# Patient Record
Sex: Male | Born: 1970
Health system: Southern US, Community
[De-identification: ages and names within clinical notes are randomized; demographics above are authoritative.]

## PROBLEM LIST (undated history)

## (undated) ENCOUNTER — Ambulatory Visit (HOSPITAL_COMMUNITY): Discharge status: Absent without leave | Payer: Medicaid Other

## (undated) DIAGNOSIS — C210 Malignant neoplasm of anus, unspecified: Secondary | ICD-10-CM

## (undated) DIAGNOSIS — F419 Anxiety disorder, unspecified: Secondary | ICD-10-CM

## (undated) DIAGNOSIS — Z21 Asymptomatic human immunodeficiency virus [HIV] infection status: Secondary | ICD-10-CM

## (undated) DIAGNOSIS — K648 Other hemorrhoids: Secondary | ICD-10-CM

## (undated) DIAGNOSIS — K37 Unspecified appendicitis: Secondary | ICD-10-CM

## (undated) DIAGNOSIS — B20 Human immunodeficiency virus [HIV] disease: Secondary | ICD-10-CM

## (undated) DIAGNOSIS — K649 Unspecified hemorrhoids: Secondary | ICD-10-CM

## (undated) DIAGNOSIS — F329 Major depressive disorder, single episode, unspecified: Secondary | ICD-10-CM

## (undated) DIAGNOSIS — A630 Anogenital (venereal) warts: Secondary | ICD-10-CM

## (undated) DIAGNOSIS — J439 Emphysema, unspecified: Secondary | ICD-10-CM

## (undated) DIAGNOSIS — J449 Chronic obstructive pulmonary disease, unspecified: Secondary | ICD-10-CM

## (undated) DIAGNOSIS — R911 Solitary pulmonary nodule: Secondary | ICD-10-CM

## (undated) DIAGNOSIS — B353 Tinea pedis: Secondary | ICD-10-CM

## (undated) DIAGNOSIS — A530 Latent syphilis, unspecified as early or late: Secondary | ICD-10-CM

## (undated) DIAGNOSIS — G5600 Carpal tunnel syndrome, unspecified upper limb: Secondary | ICD-10-CM

## (undated) DIAGNOSIS — K6282 Dysplasia of anus: Secondary | ICD-10-CM

## (undated) DIAGNOSIS — Z111 Encounter for screening for respiratory tuberculosis: Secondary | ICD-10-CM

## (undated) DIAGNOSIS — F32A Depression, unspecified: Secondary | ICD-10-CM

## (undated) DIAGNOSIS — N471 Phimosis: Secondary | ICD-10-CM

## (undated) DIAGNOSIS — C20 Malignant neoplasm of rectum: Secondary | ICD-10-CM

## (undated) DIAGNOSIS — J189 Pneumonia, unspecified organism: Secondary | ICD-10-CM

## (undated) DIAGNOSIS — I10 Essential (primary) hypertension: Secondary | ICD-10-CM

## (undated) DIAGNOSIS — K08409 Partial loss of teeth, unspecified cause, unspecified class: Secondary | ICD-10-CM

## (undated) DIAGNOSIS — J939 Pneumothorax, unspecified: Secondary | ICD-10-CM

## (undated) HISTORY — DX: Tinea pedis: B35.3

## (undated) HISTORY — DX: Emphysema, unspecified: J43.9

## (undated) HISTORY — DX: Chronic obstructive pulmonary disease, unspecified: J44.9

## (undated) HISTORY — PX: ORCHIECTOMY: SHX2116

## (undated) HISTORY — DX: Encounter for screening for respiratory tuberculosis: Z11.1

## (undated) HISTORY — DX: Anxiety disorder, unspecified: F41.9

## (undated) HISTORY — PX: CONDYLOMA EXCISION/FULGURATION: SHX1389

## (undated) HISTORY — DX: Other hemorrhoids: K64.8

## (undated) HISTORY — DX: Anogenital (venereal) warts: A63.0

## (undated) HISTORY — DX: Pneumonia, unspecified organism: J18.9

## (undated) HISTORY — DX: Unspecified appendicitis: K37

## (undated) HISTORY — PX: OTHER SURGICAL HISTORY: SHX169

## (undated) HISTORY — DX: Dysplasia of anus: K62.82

---
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
## 2001-07-06 ENCOUNTER — Emergency Department (HOSPITAL_COMMUNITY): Admission: EM | Admit: 2001-07-06 | Discharge: 2001-07-06 | Payer: Self-pay | Admitting: Emergency Medicine

## 2004-02-18 ENCOUNTER — Inpatient Hospital Stay (HOSPITAL_COMMUNITY): Admission: EM | Admit: 2004-02-18 | Discharge: 2004-02-19 | Payer: Self-pay | Admitting: Emergency Medicine

## 2004-02-18 ENCOUNTER — Ambulatory Visit: Payer: Self-pay | Admitting: Infectious Diseases

## 2004-03-05 ENCOUNTER — Emergency Department (HOSPITAL_COMMUNITY): Admission: EM | Admit: 2004-03-05 | Discharge: 2004-03-05 | Payer: Self-pay | Admitting: Family Medicine

## 2004-04-24 ENCOUNTER — Emergency Department (HOSPITAL_COMMUNITY): Admission: EM | Admit: 2004-04-24 | Discharge: 2004-04-24 | Payer: Self-pay | Admitting: Emergency Medicine

## 2004-06-18 ENCOUNTER — Ambulatory Visit (HOSPITAL_COMMUNITY): Admission: RE | Admit: 2004-06-18 | Discharge: 2004-06-18 | Payer: Self-pay | Admitting: Internal Medicine

## 2004-06-18 ENCOUNTER — Ambulatory Visit: Payer: Self-pay | Admitting: Internal Medicine

## 2004-09-04 ENCOUNTER — Emergency Department (HOSPITAL_COMMUNITY): Admission: EM | Admit: 2004-09-04 | Discharge: 2004-09-05 | Payer: Self-pay | Admitting: *Deleted

## 2005-04-21 DIAGNOSIS — J189 Pneumonia, unspecified organism: Secondary | ICD-10-CM

## 2005-04-21 HISTORY — DX: Pneumonia, unspecified organism: J18.9

## 2012-04-21 DIAGNOSIS — Z111 Encounter for screening for respiratory tuberculosis: Secondary | ICD-10-CM

## 2012-04-21 HISTORY — DX: Encounter for screening for respiratory tuberculosis: Z11.1

## 2012-06-28 LAB — CBC AND DIFFERENTIAL
HCT: 44 % (ref 41–53)
Platelets: 163 10*3/uL (ref 150–399)

## 2013-04-09 ENCOUNTER — Emergency Department (HOSPITAL_COMMUNITY)
Admission: EM | Admit: 2013-04-09 | Discharge: 2013-04-09 | Disposition: A | Discharge status: Home / Self Care | Payer: Self-pay | Attending: Emergency Medicine | Admitting: Emergency Medicine

## 2013-04-09 ENCOUNTER — Encounter (HOSPITAL_COMMUNITY): Payer: Self-pay | Admitting: Emergency Medicine

## 2013-04-09 DIAGNOSIS — Z79899 Other long term (current) drug therapy: Secondary | ICD-10-CM | POA: Insufficient documentation

## 2013-04-09 DIAGNOSIS — Z21 Asymptomatic human immunodeficiency virus [HIV] infection status: Secondary | ICD-10-CM | POA: Insufficient documentation

## 2013-04-09 DIAGNOSIS — R51 Headache: Secondary | ICD-10-CM | POA: Insufficient documentation

## 2013-04-09 DIAGNOSIS — G589 Mononeuropathy, unspecified: Secondary | ICD-10-CM | POA: Insufficient documentation

## 2013-04-09 DIAGNOSIS — G629 Polyneuropathy, unspecified: Secondary | ICD-10-CM

## 2013-04-09 DIAGNOSIS — F17200 Nicotine dependence, unspecified, uncomplicated: Secondary | ICD-10-CM | POA: Insufficient documentation

## 2013-04-09 HISTORY — DX: Asymptomatic human immunodeficiency virus (hiv) infection status: Z21

## 2013-04-09 HISTORY — DX: Human immunodeficiency virus (HIV) disease: B20

## 2013-04-09 LAB — BASIC METABOLIC PANEL
BUN: 15 mg/dL (ref 6–23)
CO2: 28 mEq/L (ref 19–32)
Calcium: 8.9 mg/dL (ref 8.4–10.5)
Chloride: 104 mEq/L (ref 96–112)
Creatinine, Ser: 1.15 mg/dL (ref 0.50–1.35)
GFR calc Af Amer: 89 mL/min — ABNORMAL LOW (ref >90)
GFR calc non Af Amer: 77 mL/min — ABNORMAL LOW (ref >90)
Glucose, Bld: 96 mg/dL (ref 70–99)
Potassium: 4.5 mEq/L (ref 3.5–5.1)
Sodium: 139 mEq/L (ref 135–145)

## 2013-04-09 MED ORDER — GABAPENTIN 300 MG PO CAPS: 300.0000 mg | ORAL_CAPSULE | Freq: Three times a day (TID) | ORAL | Status: DC

## 2013-04-09 NOTE — ED Notes (Signed)
Pt called for room.  Pt not in waiting room at this time.

## 2013-04-09 NOTE — ED Notes (Signed)
Pt. Stated, Jeremiah [name] had left arm pain and tingling for the last 2 weeks.

## 2013-04-09 NOTE — ED Notes (Signed)
Pt called for room x 2.  Not in waiting room.

## 2013-04-09 NOTE — ED Provider Notes (Signed)
Complains of constant left hand pain  with tingling and tingling in fingers of right hand for the past 2 weeks. Symptoms worse at night. Not improved with anything. No fever no chest pain no shortness of breath and he also reports right-sided temporal headaches for the past several weeks, intermittent, treated with Marlin Canary powder with transient relief on exam alert Glasgow Coma Score 15 HEENT exam no facial asymmetry neck supple no bruit heart regular in rhythm lungs clear auscultation neurologic motor strength 5 over 5 overall bilateral upper extremities with radial pulse 2+, good capillary refill, full range of motion .suspect peripheral neuropathy   Doug Sou, MD 04/09/13 1555

## 2013-04-09 NOTE — ED Provider Notes (Signed)
CSN: [id number]     Arrival date & time 04/09/13  1324 History   First MD Initiated Contact with Patient 04/09/13 1521     Chief Complaint  Patient presents with  . Arm Pain   HPI  Jeremiah [name] is a 42 y/o male who presents with cc of left arm pain and tingling for the last several weeks. He states that his symptoms are predominately at night. He states he has predominately left hand and arm pain at night. He states that after waking up, he uses his hands and his symptoms improved except for residual tingling in both hands but predominately the left hand. He has tingling currently in his right fingertips as well the entirety of his left hand. He has had associated mild right frontal headaches as well sometimes 2-3 times a day. Today he took a goody powder for his headache with relief. He denies any fevers or neck stiffness. He denies any additional symptoms. He has been taking his antivirals as prescribed and states that his viral load has been "undetectable for years".   Past Medical History  Diagnosis Date  . HIV (human immunodeficiency virus infection)    History reviewed. No pertinent past surgical history. No family history on file. History  Substance Use Topics  . Smoking status: Current Some Day Smoker  . Smokeless tobacco: Not on file  . Alcohol Use: Yes    Review of Systems  Constitutional: Negative for fever and chills.  Respiratory: Negative for cough.   Cardiovascular: Negative for chest pain.  Gastrointestinal: Negative for nausea, vomiting and abdominal pain.  Neurological: Positive for numbness and headaches. Negative for weakness.  All other systems reviewed and are negative.    Allergies  Review of patient's allergies indicates no known allergies.  Home Medications   Current Outpatient Rx  Name  Route  Sig  Dispense  Refill  . emtricitabine-tenofovir (TRUVADA) 200-300 MG per tablet   Oral   Take 1 tablet by mouth daily.         Marland Kitchen lopinavir-ritonavir  (KALETRA) 200-50 MG per tablet   Oral   Take 2 tablets by mouth 2 (two) times daily.         Marland Kitchen gabapentin (NEURONTIN) 300 MG capsule   Oral   Take 1 capsule (300 mg total) by mouth 3 (three) times daily.   90 capsule   0    BP 120/88  Pulse 78  Temp(Src) 97.6 F (36.4 C) (Oral)  Resp 17  SpO2 99% Physical Exam  Constitutional: He is oriented to person, place, and time. He appears well-developed and well-nourished. No distress.  HENT:  Head: Normocephalic and atraumatic.  Mouth/Throat: No oropharyngeal exudate.  Eyes: Conjunctivae are normal. Pupils are equal, round, and reactive to light.  Neck: Normal range of motion. Neck supple.  Cardiovascular: Normal rate and normal heart sounds.  Exam reveals no gallop and no friction rub.   No murmur heard. Pulmonary/Chest: Effort normal and breath sounds normal.  Abdominal: Soft. He exhibits no distension. There is no tenderness.  Musculoskeletal: Normal range of motion. He exhibits no edema and no tenderness.  Neurological: He is alert and oriented to person, place, and time. He has normal strength and normal reflexes. No cranial nerve deficit or sensory deficit. Coordination normal. GCS eye subscore is 4. GCS verbal subscore is 5. GCS motor subscore is 6.  Normal strength and sensation in bilateral upper extremities.   Skin: Skin is warm and dry.    ED Course  Procedures (including critical care time) Labs Review Labs Reviewed  BASIC METABOLIC PANEL - Abnormal; Notable for the following:    GFR calc non Af Amer 77 (*)    GFR calc Af Amer 89 (*)    All other components within normal limits   Imaging Review No results found.  EKG Interpretation   None       MDM   Paraesthesias and pain to bilateral hands for several weeks. Left greater than right. Vitals normal. Non focal exam. No neck pain and Spurling negative. Sensation and strength intact to bilateral hands. No s/s of infection. Has had headaches but have been mild  and with non focal neurological exam doubt mass or bleed. Symptoms are not c/w CVA. Likely neuropathy secondary to medications. Will start on low dose neurotontin. The patient has recently moved here and is currently establishing care with a pcp with an intake exam in the next few days. Return precautions given and discussed with the patient who was in agreement with the plan.   1. Neuropathy        Jeremiah Ace, MD 04/10/13 [phone]

## 2013-04-09 NOTE — ED Notes (Signed)
Pt. Stated, I've been getting headaches 2-3 times a day.

## 2013-04-09 NOTE — ED Notes (Signed)
Patient discharged to home with family. NAD.  

## 2013-04-10 NOTE — ED Provider Notes (Signed)
I have personally seen and examined the patient.  I have discussed the plan of care with the resident.  I have reviewed the documentation on PMH/FH/Soc. History.  I have reviewed the documentation of the resident and agree.  Doug Sou, MD 04/10/13 [phone]

## 2013-05-03 ENCOUNTER — Ambulatory Visit (INDEPENDENT_AMBULATORY_CARE_PROVIDER_SITE_OTHER): Payer: Self-pay

## 2013-05-03 DIAGNOSIS — B20 Human immunodeficiency virus [HIV] disease: Secondary | ICD-10-CM

## 2013-05-03 DIAGNOSIS — Z79899 Other long term (current) drug therapy: Secondary | ICD-10-CM

## 2013-05-03 DIAGNOSIS — A630 Anogenital (venereal) warts: Secondary | ICD-10-CM

## 2013-05-03 DIAGNOSIS — Z8619 Personal history of other infectious and parasitic diseases: Secondary | ICD-10-CM

## 2013-05-03 DIAGNOSIS — Z113 Encounter for screening for infections with a predominantly sexual mode of transmission: Secondary | ICD-10-CM

## 2013-05-03 LAB — CBC WITH DIFFERENTIAL/PLATELET
Basophils Absolute: 0 10*3/uL (ref 0.0–0.1)
Basophils Relative: 0 % (ref 0–1)
Eosinophils Absolute: 0.1 10*3/uL (ref 0.0–0.7)
Eosinophils Relative: 2 % (ref 0–5)
HCT: 41.6 % (ref 39.0–52.0)
Hemoglobin: 14.3 g/dL (ref 13.0–17.0)
Lymphocytes Relative: 44 % (ref 12–46)
Lymphs Abs: 2.8 10*3/uL (ref 0.7–4.0)
MCH: 33 pg (ref 26.0–34.0)
MCHC: 34.4 g/dL (ref 30.0–36.0)
MCV: 96.1 fL (ref 78.0–100.0)
Monocytes Absolute: 0.5 10*3/uL (ref 0.1–1.0)
Monocytes Relative: 7 % (ref 3–12)
Neutro Abs: 3 10*3/uL (ref 1.7–7.7)
Neutrophils Relative %: 47 % (ref 43–77)
Platelets: 195 10*3/uL (ref 150–400)
RBC: 4.33 MIL/uL (ref 4.22–5.81)
RDW: 12.9 % (ref 11.5–15.5)
WBC: 6.3 10*3/uL (ref 4.0–10.5)

## 2013-05-03 LAB — COMPLETE METABOLIC PANEL WITH GFR
ALT: 13 U/L (ref 0–53)
AST: 22 U/L (ref 0–37)
Albumin: 4.1 g/dL (ref 3.5–5.2)
Alkaline Phosphatase: 71 U/L (ref 39–117)
BUN: 17 mg/dL (ref 6–23)
CO2: 27 mEq/L (ref 19–32)
Calcium: 9.2 mg/dL (ref 8.4–10.5)
Chloride: 102 mEq/L (ref 96–112)
Creat: 0.89 mg/dL (ref 0.50–1.35)
GFR, Est African American: >89 mL/min
GFR, Est Non African American: >89 mL/min
Glucose, Bld: 79 mg/dL (ref 70–99)
Potassium: 4.7 mEq/L (ref 3.5–5.3)
Sodium: 135 mEq/L (ref 135–145)
Total Bilirubin: 1.1 mg/dL (ref 0.3–1.2)
Total Protein: 7.2 g/dL (ref 6.0–8.3)

## 2013-05-03 LAB — LIPID PANEL
Cholesterol: 183 mg/dL (ref 0–200)
HDL: 55 mg/dL (ref >39)
LDL Cholesterol: 93 mg/dL (ref 0–99)
Total CHOL/HDL Ratio: 3.3 Ratio
Triglycerides: 177 mg/dL — ABNORMAL HIGH (ref <150)
VLDL: 35 mg/dL (ref 0–40)

## 2013-05-04 LAB — HEPATITIS B SURFACE ANTIBODY,QUALITATIVE: Hep B S Ab: POSITIVE — AB

## 2013-05-04 LAB — URINALYSIS
Bilirubin Urine: NEGATIVE
Glucose, UA: NEGATIVE mg/dL
Hgb urine dipstick: NEGATIVE
Ketones, ur: NEGATIVE mg/dL
Leukocytes, UA: NEGATIVE
Nitrite: NEGATIVE
Protein, ur: NEGATIVE mg/dL
Specific Gravity, Urine: 1.014 (ref 1.005–1.030)
Urobilinogen, UA: 0.2 mg/dL (ref 0.0–1.0)
pH: 6 (ref 5.0–8.0)

## 2013-05-04 LAB — HEPATITIS A ANTIBODY, TOTAL: Hep A Total Ab: NONREACTIVE

## 2013-05-04 LAB — T.PALLIDUM AB, TOTAL: T pallidum Antibodies (TP-PA): >8 S/CO — ABNORMAL HIGH (ref <0.90)

## 2013-05-04 LAB — HEPATITIS B CORE ANTIBODY, TOTAL: Hep B Core Total Ab: REACTIVE — AB

## 2013-05-04 LAB — HIV-1 RNA ULTRAQUANT REFLEX TO GENTYP+
HIV 1 RNA Quant: <20 copies/mL (ref <20)
HIV-1 RNA Quant, Log: <1.3 {Log} (ref <1.30)

## 2013-05-04 LAB — HEPATITIS C ANTIBODY: HCV Ab: NEGATIVE

## 2013-05-04 LAB — T-HELPER CELL (CD4) - (RCID CLINIC ONLY)
CD4 % Helper T Cell: 21 % — ABNORMAL LOW (ref 33–55)
CD4 T Cell Abs: 580 /uL (ref 400–2700)

## 2013-05-04 LAB — HEPATITIS B SURFACE ANTIGEN: Hepatitis B Surface Ag: NEGATIVE

## 2013-05-04 LAB — RPR: RPR Ser Ql: REACTIVE — AB

## 2013-05-04 LAB — RPR TITER: RPR Titer: 1:8 {titer}

## 2013-05-05 LAB — TB SKIN TEST
Induration: 0 mm
TB Skin Test: NEGATIVE

## 2013-05-06 ENCOUNTER — Other Ambulatory Visit: Payer: Self-pay | Admitting: Licensed Clinical Social Worker

## 2013-05-06 DIAGNOSIS — Z23 Encounter for immunization: Secondary | ICD-10-CM

## 2013-05-06 DIAGNOSIS — B20 Human immunodeficiency virus [HIV] disease: Secondary | ICD-10-CM

## 2013-05-06 DIAGNOSIS — A630 Anogenital (venereal) warts: Secondary | ICD-10-CM | POA: Insufficient documentation

## 2013-05-06 DIAGNOSIS — Z8619 Personal history of other infectious and parasitic diseases: Secondary | ICD-10-CM | POA: Insufficient documentation

## 2013-05-06 DIAGNOSIS — A539 Syphilis, unspecified: Secondary | ICD-10-CM | POA: Insufficient documentation

## 2013-05-06 DIAGNOSIS — A530 Latent syphilis, unspecified as early or late: Secondary | ICD-10-CM | POA: Insufficient documentation

## 2013-05-06 HISTORY — DX: Personal history of other infectious and parasitic diseases: Z86.19

## 2013-05-06 MED ORDER — EMTRICITABINE-TENOFOVIR DF 200-300 MG PO TABS: 1.0000 | ORAL_TABLET | Freq: Every day | ORAL | Status: DC

## 2013-05-06 MED ORDER — LOPINAVIR-RITONAVIR 200-50 MG PO TABS: 2.0000 | ORAL_TABLET | Freq: Two times a day (BID) | ORAL | Status: DC

## 2013-05-06 NOTE — Progress Notes (Signed)
Patient is here today after recently being released from prison.  He is experiencing depression related to being homeless and living at the shelter. We discussed his feelings and counseling was offered which he refused at this time.  He is having pain from anal warts which have been present for at least two months this episode but history for at least 3-4 years.  According to patient anal warts  are very large and have continued to spread. He was awaiting a  removal  procedure prior to his release but it was never scheduled.  He states he is 100% adherent with current HIV regimen of Kaletra and Truvada.  He has two tattoos which were placed while in prison bilateral arms. Dental caries . Partial medical records received.  Vaccines Updated Patient spoke with Housing Coordinator which is located within our facility.  Myriam Jacobson is his Case Freight forwarder at [employer].   Laverle Patter, RN

## 2013-05-19 ENCOUNTER — Ambulatory Visit (INDEPENDENT_AMBULATORY_CARE_PROVIDER_SITE_OTHER): Payer: Self-pay | Admitting: Internal Medicine

## 2013-05-19 ENCOUNTER — Encounter: Payer: Self-pay | Admitting: Internal Medicine

## 2013-05-19 VITALS — BP 119/81 | HR 69 | Temp 97.9°F | Wt 164.0 lb

## 2013-05-19 DIAGNOSIS — F329 Major depressive disorder, single episode, unspecified: Secondary | ICD-10-CM | POA: Insufficient documentation

## 2013-05-19 DIAGNOSIS — F32A Depression, unspecified: Secondary | ICD-10-CM | POA: Insufficient documentation

## 2013-05-19 DIAGNOSIS — E785 Hyperlipidemia, unspecified: Secondary | ICD-10-CM

## 2013-05-19 DIAGNOSIS — Z9889 Other specified postprocedural states: Secondary | ICD-10-CM

## 2013-05-19 DIAGNOSIS — F3289 Other specified depressive episodes: Secondary | ICD-10-CM

## 2013-05-19 DIAGNOSIS — F17210 Nicotine dependence, cigarettes, uncomplicated: Secondary | ICD-10-CM

## 2013-05-19 DIAGNOSIS — F17200 Nicotine dependence, unspecified, uncomplicated: Secondary | ICD-10-CM

## 2013-05-19 DIAGNOSIS — E781 Pure hyperglyceridemia: Secondary | ICD-10-CM

## 2013-05-19 DIAGNOSIS — K029 Dental caries, unspecified: Secondary | ICD-10-CM

## 2013-05-19 DIAGNOSIS — B20 Human immunodeficiency virus [HIV] disease: Secondary | ICD-10-CM | POA: Insufficient documentation

## 2013-05-19 DIAGNOSIS — Z9079 Acquired absence of other genital organ(s): Secondary | ICD-10-CM | POA: Insufficient documentation

## 2013-05-19 HISTORY — DX: Hyperlipidemia, unspecified: E78.5

## 2013-05-19 MED ORDER — LOPINAVIR-RITONAVIR 200-50 MG PO TABS: 2.0000 | ORAL_TABLET | Freq: Two times a day (BID) | ORAL | Status: DC

## 2013-05-19 MED ORDER — EMTRICITABINE-TENOFOVIR DF 200-300 MG PO TABS: 1.0000 | ORAL_TABLET | Freq: Every day | ORAL | Status: DC

## 2013-05-19 NOTE — Progress Notes (Signed)
Patient ID: Jeremiah [name], male   DOB: 12/12/1970, 43 y.o.   MRN: [id number]          [facility]  Patient Active Problem List   Diagnosis Date Noted  . Human immunodeficiency virus (HIV) disease 05/19/2013    Priority: High  . Dyslipidemia 05/19/2013  . Cigarette smoker 05/19/2013  . Depression 05/19/2013  . Dental caries 05/19/2013  . Hx of unilateral orchiectomy 05/19/2013  . History of syphilis 05/06/2013  . Anal warts 05/06/2013  . History of chlamydia 05/06/2013    Patient's Medications  New Prescriptions   No medications on file  Previous Medications   No medications on file  Modified Medications   Modified Medication Previous Medication   EMTRICITABINE-TENOFOVIR (TRUVADA) 200-300 MG PER TABLET emtricitabine-tenofovir (TRUVADA) 200-300 MG per tablet      Take 1 tablet by mouth daily.    Take 1 tablet by mouth daily.   LOPINAVIR-RITONAVIR (KALETRA) 200-50 MG PER TABLET lopinavir-ritonavir (KALETRA) 200-50 MG per tablet      Take 2 tablets by mouth 2 (two) times daily.    Take 2 tablets by mouth 2 (two) times daily.  Discontinued Medications   GABAPENTIN (NEURONTIN) 300 MG CAPSULE    Take 1 capsule (300 mg total) by mouth 3 (three) times daily.    Subjective: Jeremiah [name] is here for an intake visit. He was diagnosed with HIV infection in 2004 when he was briefly incarcerated. He does not think he ever been tested previously. He believes he was infected through homosexual intercourse. He was initially devastated but soon came to except his infection. He recalls starting on a regimen that he thinks was Truvada and Sustiva initially but recalls having some sort of side effect to Sustiva. He switched to Truvada and Kaletra about 8 years ago and has had no significant problems tolerating it. He will have occasional diarrhea 2-3 times each month. He rarely recalls missing any doses. He recently moved here from [facility]. He is currently  living in [facility] and working with his case manager her to find stable, section 8 housing. None of his family are aware of his infection and he is now practically close to them. He has told a few close friends including one friend here in [place] who is also HIV infected. He is not in her current relationship and has not been sexually active recently. He has been going to [employer] on a daily basis. He recently had some problems with painful tingling in his left hand and took Neurontin for a short period of time. The pain improved but he still has some mild tingling. He does not feel it is bad enough to continue the Neurontin. He does smoke cigarettes and says he would be willing to consider quitting.  Review of Systems: Pertinent items are noted in HPI.  Past Medical History  Diagnosis Date  . HIV (human immunodeficiency virus infection)   . Pneumonia 2007  . History of tuberculin skin testing within last year 2014    History  Substance Use Topics  . Smoking status: Current Every Day Smoker -- 1.00 packs/day for 24 years    Types: Cigarettes  . Smokeless tobacco: Never Used  . Alcohol Use: No    Family History  Problem Relation Age of Onset  . Diabetes Mother   . Hypertension Mother   . Cancer Mother     breast  . Diabetes Father   . Hypertension Father  No Known Allergies  Objective: Temp: 97.9 F (36.6 C) (01/29 1351) Temp src: Oral (01/29 1351) BP: 119/81 mmHg (01/29 1351) Pulse Rate: 69 (01/29 1351)  There is no height on file to calculate BMI.  General: He is in no distress Oral: His teeth are in poor condition with many carious, broken and missing teeth Skin: No rash Lungs: Clear Cor: Regular S1 and S2 with no murmurs Abdomen: Soft and nontender GU: He has only one testicle. He has multiple, large anal warts Joints and extremities: Normal Neuro: Alert and fully oriented with normal speech and conversation Mood and affect: Normal  Lab  Results Lab Results  Component Value Date   WBC 6.3 05/03/2013   HGB 14.3 05/03/2013   HCT 41.6 05/03/2013   MCV 96.1 05/03/2013   PLT 195 05/03/2013    Lab Results  Component Value Date   CREATININE 0.89 05/03/2013   BUN 17 05/03/2013   NA 135 05/03/2013   K 4.7 05/03/2013   CL 102 05/03/2013   CO2 27 05/03/2013    Lab Results  Component Value Date   ALT 13 05/03/2013   AST 22 05/03/2013   ALKPHOS 71 05/03/2013   BILITOT 1.1 05/03/2013    Lab Results  Component Value Date   CHOL 183 05/03/2013   HDL 55 05/03/2013   LDLCALC 93 05/03/2013   TRIG 177* 05/03/2013   CHOLHDL 3.3 05/03/2013    Lab Results HIV 1 RNA Quant (copies/mL)  Date Value  05/03/2013 <20      CD4 T Cell Abs (/uL)  Date Value  05/03/2013 580      Assessment: His HIV infection is under excellent control. He is quite knowledgeable and adherent to his therapy. It sounds like he has never had any problem with resistance so he does have the option of simplification to a once daily regimen. He prefers to continue his current regimen for now but will consider simplification to once daily Stribild.  He has very poor dentition.  He will need surgery for his large anal warts.  He is in the contemplation stage of cigarette cessation.  Plan: 1. Continue Truvada and Kaletra for now but he will consider switching to once daily Stribild 2. Complete [facility] ADAP application process 3. Dental clinic referral 4. Gen. surgery referral 5. Continue working with his case Freight forwarder on stable housing 6. Cigarette cessation and prevention for positive counseling provided 7. Followup in 6 weeks   Michel Bickers, MD [facility] [place] [phone] pager   [phone] cell 05/19/2013, 5:34 PM

## 2013-05-23 ENCOUNTER — Other Ambulatory Visit: Payer: Self-pay | Admitting: Licensed Clinical Social Worker

## 2013-05-23 DIAGNOSIS — B20 Human immunodeficiency virus [HIV] disease: Secondary | ICD-10-CM

## 2013-05-23 MED ORDER — EMTRICITABINE-TENOFOVIR DF 200-300 MG PO TABS: 1.0000 | ORAL_TABLET | Freq: Every day | ORAL | Status: DC

## 2013-05-23 MED ORDER — LOPINAVIR-RITONAVIR 200-50 MG PO TABS: 2.0000 | ORAL_TABLET | Freq: Two times a day (BID) | ORAL | Status: DC

## 2013-06-03 ENCOUNTER — Other Ambulatory Visit: Payer: Self-pay | Admitting: *Deleted

## 2013-06-03 DIAGNOSIS — B20 Human immunodeficiency virus [HIV] disease: Secondary | ICD-10-CM

## 2013-06-03 MED ORDER — LOPINAVIR-RITONAVIR 200-50 MG PO TABS: 2.0000 | ORAL_TABLET | Freq: Two times a day (BID) | ORAL | Status: DC

## 2013-06-03 MED ORDER — EMTRICITABINE-TENOFOVIR DF 200-300 MG PO TABS: 1.0000 | ORAL_TABLET | Freq: Every day | ORAL | Status: DC

## 2013-06-05 ENCOUNTER — Emergency Department (HOSPITAL_COMMUNITY)
Admission: EM | Admit: 2013-06-05 | Discharge: 2013-06-05 | Disposition: A | Discharge status: Home / Self Care | Payer: Self-pay | Attending: Emergency Medicine | Admitting: Emergency Medicine

## 2013-06-05 ENCOUNTER — Encounter (HOSPITAL_COMMUNITY): Payer: Self-pay | Admitting: Emergency Medicine

## 2013-06-05 DIAGNOSIS — F17200 Nicotine dependence, unspecified, uncomplicated: Secondary | ICD-10-CM | POA: Insufficient documentation

## 2013-06-05 DIAGNOSIS — Z79899 Other long term (current) drug therapy: Secondary | ICD-10-CM | POA: Insufficient documentation

## 2013-06-05 DIAGNOSIS — K029 Dental caries, unspecified: Secondary | ICD-10-CM | POA: Insufficient documentation

## 2013-06-05 DIAGNOSIS — Z8701 Personal history of pneumonia (recurrent): Secondary | ICD-10-CM | POA: Insufficient documentation

## 2013-06-05 DIAGNOSIS — Z21 Asymptomatic human immunodeficiency virus [HIV] infection status: Secondary | ICD-10-CM | POA: Insufficient documentation

## 2013-06-05 DIAGNOSIS — K08109 Complete loss of teeth, unspecified cause, unspecified class: Secondary | ICD-10-CM | POA: Insufficient documentation

## 2013-06-05 MED ORDER — PENICILLIN V POTASSIUM 250 MG PO TABS
500.0000 mg | ORAL_TABLET | Freq: Three times a day (TID) | ORAL | Status: AC
Start: 2013-06-05 — End: 2013-06-12

## 2013-06-05 MED ORDER — HYDROCODONE-ACETAMINOPHEN 5-325 MG PO TABS: 1.0000 | ORAL_TABLET | ORAL | Status: DC | PRN

## 2013-06-05 MED ORDER — IBUPROFEN 800 MG PO TABS: 800.0000 mg | ORAL_TABLET | Freq: Three times a day (TID) | ORAL | Status: DC

## 2013-06-05 NOTE — ED Provider Notes (Signed)
CSN: [id number]     Arrival date & time 06/05/13  1356 History  This chart was scribed for Charlann Lange, non-physician practitioner, working with Tanna Furry, MD, by Sydell Axon, ED Scribe. This patient was seen in room TR09C/TR09C and the patient's care was started at 2:41 PM.   Chief Complaint  Patient presents with  . Dental Pain   The history is provided by the patient. No language interpreter was used.   HPI Comments: Jeremiah [name] is a 43 y.o. male who presents to the Emergency Department complaining of sudden dental pain to his two front upper teeth with onset this morning. He characterizes the pain as a constant, non changing aching pain and rates the severity as moderate. Patient reports that he has had dental problems, with several teeth missing in the past and that his front upper teeth are shaky. He denies any facial swelling or fever. Patient confirms he is an every day smoker.  Past Medical History  Diagnosis Date  . HIV (human immunodeficiency virus infection)   . Pneumonia 2007  . History of tuberculin skin testing within last year 2014   Past Surgical History  Procedure Laterality Date  . Hernia repair  1991    Abdominal   . Unilateral orchiectomy     Family History  Problem Relation Age of Onset  . Diabetes Mother   . Hypertension Mother   . Cancer Mother     breast  . Diabetes Father   . Hypertension Father    History  Substance Use Topics  . Smoking status: Current Every Day Smoker -- 1.00 packs/day for 24 years    Types: Cigarettes  . Smokeless tobacco: Never Used  . Alcohol Use: No    Review of Systems  Constitutional: Negative for chills.  HENT: Positive for dental problem. Negative for facial swelling, sinus pressure and sore throat.   Respiratory: Negative for cough and shortness of breath.   Gastrointestinal: Negative for nausea, vomiting and diarrhea.  Neurological: Negative for weakness.  All other systems reviewed and are  negative.   Allergies  Review of patient's allergies indicates no known allergies.  Home Medications   Current Outpatient Rx  Name  Route  Sig  Dispense  Refill  . emtricitabine-tenofovir (TRUVADA) 200-300 MG per tablet   Oral   Take 1 tablet by mouth daily.   30 tablet   6   . lopinavir-ritonavir (KALETRA) 200-50 MG per tablet   Oral   Take 2 tablets by mouth 2 (two) times daily.   30 tablet   6    Triage Vitals: BP 100/70  Pulse 67  Temp(Src) 97 F (36.1 C) (Oral)  Resp 16  SpO2 100%  Physical Exam  Nursing note and vitals reviewed. Constitutional: He is oriented to person, place, and time. He appears well-developed and well-nourished. No distress.  HENT:  Head: Normocephalic and atraumatic.  Widespread and marked decay of upper front teeth and molars. Lower teeth show widespread gingival disease with redness and swelling. No visualized abscess. No facial swelling. No adenopathy.  Eyes: EOM are normal.  Neck: Neck supple.  Pulmonary/Chest: Effort normal. No respiratory distress.  Musculoskeletal: Normal range of motion.  Lymphadenopathy:    He has no cervical adenopathy.  Neurological: He is alert and oriented to person, place, and time.  Skin: Skin is warm and dry.  Psychiatric: He has a normal mood and affect. His behavior is normal.    ED Course  Procedures (including critical care time) DIAGNOSTIC  STUDIES: Oxygen Saturation is 100% on room air, normal by my interpretation.    COORDINATION OF CARE: 2:45 PM-Will prescribe antibiotic and pain medication, & recommended patient to see a dentist. Advised patient to use dental wax prior to smoking.Treatment plan discussed with patient and patient agrees.  Labs Review Labs Reviewed - No data to display Imaging Review No results found.  EKG Interpretation   None       MDM   Final diagnoses:  None    1. Dental decay  Marked dental decay without visualized drainable abscess. Abx started, pain  medication. Encouraged dental follow up. No fever or facial swelling to suggest systemic infection.  I personally performed the services described in this documentation, which was scribed in my presence. The recorded information has been reviewed and is accurate.    Dewaine Oats, PA-C 06/05/13 1631

## 2013-06-05 NOTE — Discharge Instructions (Signed)
Dental Caries Dental caries is tooth decay. This decay can cause a hole in teeth (cavity) that can get bigger and deeper over time. HOME CARE  Brush and floss your teeth. Do this at least two times a day.  Use a fluoride toothpaste.  Use a mouth rinse if told by your dentist or doctor.  Eat less sugary and starchy foods. Drink less sugary drinks.  Avoid snacking often on sugary and starchy foods. Avoid sipping often on sugary drinks.  Keep regular checkups and cleanings with your dentist.  Use fluoride supplements if told by your dentist or doctor.  Allow fluoride to be applied to teeth if told by your dentist or doctor. MAKE SURE YOU:  Understand these instructions.  Will watch your condition.  Will get help right away if you are not doing well or get worse. Document Released: 01/15/2008 Document Revised: 12/08/2012 Document Reviewed: 04/09/2012 [facility] Patient Information 2014 [place], [place].  Dental Extraction A dental extraction procedure refers to a routine tooth extraction performed by your dentist. The procedure depends on where and how the tooth is positioned. The procedure can be very quick, sometimes lasting only seconds. Reasons for dental extraction include:  Tooth decay.  Infections (abcesses).  The need to make room for other teeth.  Gum disease s where the supporting bone has been destroyed.  Fractures of the tooth leaving it unrestorable.  Extra teeth (supernumerary) or grossly malformed teeth.  Baby teeththat have not fallen out in time and have not permitted the the permanent teeth to erupt properly.  In preparation for braces where there is not enough room to align the teeth properly.  Not enough room for wisdom teeth (particularly those that are impacted).  Prior to receiving radiation to the head and neck,teeth in the field of radiation may need to be extracted. LET YOUR CAREGIVER KNOW ABOUT:  Any allergies.  All medicines you are  taking:  Including herbs, eye drops, over-the-counter medications, and creams.  Blood thinners (anticoagulants), aspirin or other drugs that may affect blood clotting.  Use of steroids (through mouth or as creams).  Previous problems with anesthetics, including local anesthetics.  History of bleeding or blood problems.  Previous surgery.  Possibility of pregnancy if this applies.  Smoking history.  Any health problems. RISKS AND COMPLICATIONS As with any procedure, complications may occur, but they can usually be managed by your caregiver. General surgical complications may include:  Reaction to anesthesia.  Damage to surrounding teeth, nerves, tissues, or structures.  Infection.  Bleeding. With appropriate treatment and care after surgery, the following complications are very uncommon:  Dry socket (blood clot does not form or stay in place over empty socket). This can delay healing.  Incomplete extraction of roots.  Jawbone injury, pain, or weakness. BEFORE THE PROCEDURE  Your dental care provider will:  Take a medical and dental history.  Take an X-ray to evaluate the circumstances and how to best extract the tooth.  Do an oral exam.  Depending on the situation, antibiotics may be given before or after the extraction, or before and after.  Your caregivers may review the procedure, the local anaesthesia and/or sedation being used, and what to expect after the procedure with you.  If needed, your dentist may give you a form of sedation, either by medicine you swallow, gas, or intravenously (IV). This will help to relieve anxiety. Complicated extractions may require the use of general anaesthesia. It is important to follow your caregiver's instructions prior to your procedure  to avoid complications. Steps before your procedure may include:  Alert your caregiver if you feel ill (sore throat, fever, upset stomach, etc.) in the days leading up to your  procedure.  Stop taking certain medications for several days prior to your procedure such as blood thinners.  Take certain medications, such as antibiotics.  Avoid eating and drinking for several hours before the procedure. This will help you to avoid complications from the sedation or anaesthesia.  Sign a patient consent form.  Have a friend or family member drive you to the dentist and drive you home after the procedure.  Wear comfortable, loose clothing. Limit makeup and jewelry.  Quit smoking. If you are a smoker, this will raise the chances of a healing problem after your procedure. If you are thinking about quitting, talk to your surgeon about how long before the operation you should stop smoking. You may also get help from your primary caregiver. PROCEDURE Dental extraction is typically done as an outpatient procedure. IV sedation, local anesthesia, or both may be used. It will keep you comfortable and free of pain during the procedure.  There are 2 types of extractions:  Simple extraction involves a tooth that is visible in the mouth and above the gum line. After local anesthetic is given by injection, and the area is numbed, the dentist will loosen the tooth with a special instrument (elevator). Then another instrument (forceps) will be used to grasp the tooth and remove it from its socket. During the procedure you will feel some pressure, but you should not feel pain. If you do feel pain, tell your dentist. The open socket will be cleaned. Dressings (gauze) will be placed in the socket to reduce bleeding.  Surgical extractions are used if the tooth has not come into the mouth or the tooth is broken off below the gum line. The dentist will make a cut (incision) in the gum and may have to remove some of the bone around the tooth to aid in the removal of the tooth. After removal, stitches (sutures) may be required to close area to help in healing and control bleeding. For some surgical  extractions, you may need a general anesthetic or IV sedation (through the vein). After both types of extractions, you may be given pain medication or other drugs to help healing. Other post operative instructions will be given by your dental caregiver. AFTER THE PROCEDURE  You will have gauze in your mouth where the tooth was removed. Gentle pressure on the gauze for up to 1 hour will help to control bleeding.  A blood clot will begin to form over the open socket. This is normal. Do not touch the area or rinse it.  Your pain will be controlled with medication and self-care.  You will be given detailed instructions for care after surgery. PROGNOSIS While some discomfort is normal after tooth extraction, most patients recover fully in just a few days. SEEK IMMEDIATE DENTAL CARE  You have uncontrolled bleeding, marked swelling, or severe pain.  You develop a fever, difficulty swallowing, or other severe symptoms.  You have questions or concerns. Document Released: 04/07/2005 Document Revised: 06/30/2011 Document Reviewed: 07/12/2010 [facility] Patient Information 2014 [place], [place].

## 2013-06-05 NOTE — ED Notes (Signed)
Multiple dental caries present in mouth. Many teeth missing. Upper front gums and teeth hurting starting this morning. Caries present. No swelling.

## 2013-06-09 NOTE — ED Provider Notes (Signed)
Medical screening examination/treatment/procedure(s) were performed by non-physician practitioner and as supervising physician I was immediately available for consultation/collaboration.  EKG Interpretation   None         Tanna Furry, MD 06/09/13 [phone]

## 2013-07-04 ENCOUNTER — Encounter: Payer: Self-pay | Admitting: Internal Medicine

## 2013-07-04 ENCOUNTER — Ambulatory Visit (INDEPENDENT_AMBULATORY_CARE_PROVIDER_SITE_OTHER): Payer: Self-pay | Admitting: Internal Medicine

## 2013-07-04 VITALS — BP 112/69 | HR 66 | Temp 98.4°F | Ht 71.0 in | Wt 164.5 lb

## 2013-07-04 DIAGNOSIS — Z9889 Other specified postprocedural states: Secondary | ICD-10-CM

## 2013-07-04 DIAGNOSIS — B20 Human immunodeficiency virus [HIV] disease: Secondary | ICD-10-CM

## 2013-07-04 NOTE — Progress Notes (Signed)
Patient ID: Jeremiah [name], male   DOB: October 12, 1970, 43 y.o.   MRN: [id number] @LOGODEPT         Patient Active Problem List   Diagnosis Date Noted  . Human immunodeficiency virus (HIV) disease 05/19/2013    Priority: High  . Dyslipidemia 05/19/2013  . Cigarette smoker 05/19/2013  . Depression 05/19/2013  . Dental caries 05/19/2013  . Hx of unilateral orchiectomy 05/19/2013  . History of syphilis 05/06/2013  . Anal warts 05/06/2013  . History of chlamydia 05/06/2013    Patient's Medications  New Prescriptions   No medications on file  Previous Medications   EMTRICITABINE-TENOFOVIR (TRUVADA) 200-300 MG PER TABLET    Take 1 tablet by mouth daily.   LOPINAVIR-RITONAVIR (KALETRA) 200-50 MG PER TABLET    Take 2 tablets by mouth 2 (two) times daily.  Modified Medications   No medications on file  Discontinued Medications   HYDROCODONE-ACETAMINOPHEN (NORCO/VICODIN) 5-325 MG PER TABLET    Take 1-2 tablets by mouth every 4 (four) hours as needed.   IBUPROFEN (ADVIL,MOTRIN) 800 MG TABLET    Take 1 tablet (800 mg total) by mouth 3 (three) times daily.    Subjective: Jeremiah [name] is in for his routine visit. He is now living in a boarding room and looking for work. He has not been able to see the general surgeon showed about his anal warts. He says that he is currently not thinking about quitting cigarettes but he should. He has not missed any doses of his Truvada or Kaletra. His [place] ADAP has been recertified.  Review of Systems: Pertinent items are noted in HPI.  Past Medical History  Diagnosis Date  . HIV (human immunodeficiency virus infection)   . Pneumonia 2007  . History of tuberculin skin testing within last year 2014    History  Substance Use Topics  . Smoking status: Current Every Day Smoker -- 1.00 packs/day for 24 years    Types: Cigarettes  . Smokeless tobacco: Never Used  . Alcohol Use: No    Family History  Problem Relation Age of Onset  . Diabetes Mother    . Hypertension Mother   . Cancer Mother     breast  . Diabetes Father   . Hypertension Father     No Known Allergies  Objective: Temp: 98.4 F (36.9 C) (03/16 1431) Temp src: Oral (03/16 1431) BP: 112/69 mmHg (03/16 1431) Pulse Rate: 66 (03/16 1431) Body mass index is 22.95 kg/(m^2).  General: He is in good spirits Oral: No oropharyngeal lesions. Some missing teeth Skin:  No rash Lungs: Clear Cor: Regular S1 and S2 with no murmurs  Lab Results Lab Results  Component Value Date   WBC 6.3 05/03/2013   HGB 14.3 05/03/2013   HCT 41.6 05/03/2013   MCV 96.1 05/03/2013   PLT 195 05/03/2013    Lab Results  Component Value Date   CREATININE 0.89 05/03/2013   BUN 17 05/03/2013   NA 135 05/03/2013   K 4.7 05/03/2013   CL 102 05/03/2013   CO2 27 05/03/2013    Lab Results  Component Value Date   ALT 13 05/03/2013   AST 22 05/03/2013   ALKPHOS 71 05/03/2013   BILITOT 1.1 05/03/2013    Lab Results  Component Value Date   CHOL 183 05/03/2013   HDL 55 05/03/2013   LDLCALC 93 05/03/2013   TRIG 177* 05/03/2013   CHOLHDL 3.3 05/03/2013    Lab Results HIV 1 RNA Quant (copies/mL)  Date Value  05/03/2013 <20      CD4 T Cell Abs (/uL)  Date Value  05/03/2013 580      Assessment: His HIV infection is under excellent control. He does not want to change his regimen to a simpler, 1 pill daily regimen.  I did encourage him to consider quitting cigarettes completely.  Plan: 1. Continue Truvada and Kaletra 2. Cigarette cessation counseling provided 3. We will check on his own Orange Card status and try to complete the referral for general surgery evaluation 4. Followup after laboratory 3 months   Michel Bickers, MD [facility] for [place] [phone] pager   [phone] cell 07/04/2013, 2:44 PM

## 2013-07-04 NOTE — Progress Notes (Signed)
Pt given information to apply for "[employer]."  Requested pt bring card to [facility] to make a copy for hhis record.  Will process Surgical referral once that is completed.

## 2013-07-04 NOTE — Addendum Note (Signed)
Addended by: Lorne Skeens D on: 07/04/2013 03:00 PM   Modules accepted: Orders

## 2013-09-14 ENCOUNTER — Emergency Department (HOSPITAL_COMMUNITY): Payer: Self-pay

## 2013-09-14 ENCOUNTER — Encounter (HOSPITAL_COMMUNITY): Payer: Self-pay | Admitting: Emergency Medicine

## 2013-09-14 ENCOUNTER — Emergency Department (HOSPITAL_COMMUNITY)
Admission: EM | Admit: 2013-09-14 | Discharge: 2013-09-14 | Disposition: A | Discharge status: Home / Self Care | Payer: Self-pay | Attending: Emergency Medicine | Admitting: Emergency Medicine

## 2013-09-14 DIAGNOSIS — Z79899 Other long term (current) drug therapy: Secondary | ICD-10-CM | POA: Insufficient documentation

## 2013-09-14 DIAGNOSIS — J189 Pneumonia, unspecified organism: Secondary | ICD-10-CM | POA: Insufficient documentation

## 2013-09-14 DIAGNOSIS — R51 Headache: Secondary | ICD-10-CM | POA: Insufficient documentation

## 2013-09-14 DIAGNOSIS — R519 Headache, unspecified: Secondary | ICD-10-CM

## 2013-09-14 DIAGNOSIS — F17200 Nicotine dependence, unspecified, uncomplicated: Secondary | ICD-10-CM | POA: Insufficient documentation

## 2013-09-14 DIAGNOSIS — Z21 Asymptomatic human immunodeficiency virus [HIV] infection status: Secondary | ICD-10-CM | POA: Insufficient documentation

## 2013-09-14 LAB — COMPREHENSIVE METABOLIC PANEL
ALBUMIN: 3.7 g/dL (ref 3.5–5.2)
ALT: 14 U/L (ref 0–53)
AST: 37 U/L (ref 0–37)
Alkaline Phosphatase: 80 U/L (ref 39–117)
BILIRUBIN TOTAL: 1.5 mg/dL — AB (ref 0.3–1.2)
BUN: 13 mg/dL (ref 6–23)
CALCIUM: 9.1 mg/dL (ref 8.4–10.5)
CHLORIDE: 104 meq/L (ref 96–112)
CO2: 23 mEq/L (ref 19–32)
Creatinine, Ser: 1.08 mg/dL (ref 0.50–1.35)
GFR calc Af Amer: >90 mL/min (ref >90)
GFR calc non Af Amer: 83 mL/min — ABNORMAL LOW (ref >90)
Glucose, Bld: 77 mg/dL (ref 70–99)
Potassium: 4.3 mEq/L (ref 3.7–5.3)
Sodium: 140 mEq/L (ref 137–147)
Total Protein: 7.5 g/dL (ref 6.0–8.3)

## 2013-09-14 LAB — CBC WITH DIFFERENTIAL/PLATELET
Basophils Absolute: 0 10*3/uL (ref 0.0–0.1)
Basophils Relative: 0 % (ref 0–1)
Eosinophils Absolute: 0.2 10*3/uL (ref 0.0–0.7)
Eosinophils Relative: 2 % (ref 0–5)
HEMATOCRIT: 42.5 % (ref 39.0–52.0)
Hemoglobin: 14.9 g/dL (ref 13.0–17.0)
Lymphocytes Relative: 30 % (ref 12–46)
Lymphs Abs: 2.5 10*3/uL (ref 0.7–4.0)
MCH: 34.1 pg — ABNORMAL HIGH (ref 26.0–34.0)
MCHC: 35.1 g/dL (ref 30.0–36.0)
MCV: 97.3 fL (ref 78.0–100.0)
MONO ABS: 0.7 10*3/uL (ref 0.1–1.0)
Monocytes Relative: 9 % (ref 3–12)
NEUTROS ABS: 4.8 10*3/uL (ref 1.7–7.7)
Neutrophils Relative %: 59 % (ref 43–77)
Platelets: 174 10*3/uL (ref 150–400)
RBC: 4.37 MIL/uL (ref 4.22–5.81)
RDW: 12.8 % (ref 11.5–15.5)
WBC: 8.3 10*3/uL (ref 4.0–10.5)

## 2013-09-14 MED ORDER — NAPROXEN 250 MG PO TABS
500.0000 mg | ORAL_TABLET | Freq: Once | ORAL | Status: AC
  Administered 2013-09-14: 500 mg via ORAL
  Filled 2013-09-14: qty 2

## 2013-09-14 MED ORDER — AZITHROMYCIN 250 MG PO TABS: 250.0000 mg | ORAL_TABLET | Freq: Every day | ORAL | Status: DC

## 2013-09-14 MED ORDER — GUAIFENESIN-CODEINE 100-10 MG/5ML PO SOLN: 5.0000 mL | ORAL | Status: DC | PRN

## 2013-09-14 MED ORDER — AZITHROMYCIN 250 MG PO TABS
500.0000 mg | ORAL_TABLET | Freq: Once | ORAL | Status: AC
  Administered 2013-09-14: 500 mg via ORAL
  Filled 2013-09-14: qty 2

## 2013-09-14 MED ORDER — NAPROXEN 500 MG PO TABS: 500.0000 mg | ORAL_TABLET | Freq: Two times a day (BID) | ORAL | Status: DC

## 2013-09-14 NOTE — ED Notes (Addendum)
Pt c/o intermittent HA, cough and weight loss X 3 weeks, pt sts he thinks he has lost about 9lbs. Denies trying to lose weight. sts he has also been feeling very anxious lately. Reports productive cough with yellow sputum. Hx of HIV, goes to Dr. Megan Salon, last seen by him 3 months ago and told everything was good. Nad, skin warm and dry, resp e/u.

## 2013-09-14 NOTE — ED Provider Notes (Signed)
TIME SEEN: 3:16 PM  CHIEF COMPLAINT: Cough, congestion, mild headache  HPI: Patient is a 43 year old male with a history of HIV currently on antiretroviral treatment with a recent CD4 count of 540 and a nondetectable viral load who presents emergency department 2 weeks of productive cough with yellow/green sputum and a mild intermittent headaches. He is also had intermittent right ear pain. No fevers, chills, night sweats, vomiting. He has had some intermittent diarrhea. He has no history of opportunistic infections. No history of tuberculosis. Denies hemoptysis, night sweats but states he has lost 9 pounds in the past 2 weeks. Denies a history of prior tuberculosis. He has had negative tuberculosis skin testing in the past. No chest pain or shortness of breath. No neck pain or neck stiffness. No rash. No tick bites. No recent travel. No sick contacts.  PCP is Dr. Megan Salon  ROS: See HPI Constitutional: no fever  Eyes: no drainage  ENT: no runny nose   Cardiovascular:  no chest pain  Resp: no SOB  GI: no vomiting GU: no dysuria Integumentary: no rash  Allergy: no hives  Musculoskeletal: no leg swelling  Neurological: no slurred speech ROS otherwise negative  PAST MEDICAL HISTORY/PAST SURGICAL HISTORY:  Past Medical History  Diagnosis Date  . HIV (human immunodeficiency virus infection)   . Pneumonia 2007  . History of tuberculin skin testing within last year 2014    MEDICATIONS:  Prior to Admission medications   Medication Sig Start Date End Date Taking? Authorizing Provider  emtricitabine-tenofovir (TRUVADA) 200-300 MG per tablet Take 1 tablet by mouth daily. 06/03/13  Yes Michel Bickers, MD  lopinavir-ritonavir (KALETRA) 200-50 MG per tablet Take 2 tablets by mouth 2 (two) times daily. 06/03/13  Yes Michel Bickers, MD    ALLERGIES:  No Known Allergies  SOCIAL HISTORY:  History  Substance Use Topics  . Smoking status: Current Every Day Smoker -- 1.00 packs/day for 24 years   Types: Cigarettes  . Smokeless tobacco: Never Used  . Alcohol Use: No    FAMILY HISTORY: Family History  Problem Relation Age of Onset  . Diabetes Mother   . Hypertension Mother   . Cancer Mother     breast  . Diabetes Father   . Hypertension Father     EXAM: BP 127/88  Pulse 50  Temp(Src) 98.3 F (36.8 C) (Oral)  Resp 20  Ht 5\' 11"  (1.803 m)  Wt 151 lb 14.4 oz (68.901 kg)  BMI 21.20 kg/m2  SpO2 99% CONSTITUTIONAL: Alert and oriented and responds appropriately to questions. Well-appearing; well-nourished, nontoxic, in no distress HEAD: Normocephalic EYES: Conjunctivae clear, PERRL ENT: normal nose; no rhinorrhea; moist mucous membranes; pharynx without lesions noted NECK: Supple, no meningismus, no LAD  CARD: RRR; S1 and S2 appreciated; no murmurs, no clicks, no rubs, no gallops RESP: Normal chest excursion without splinting or tachypnea; breath sounds clear and equal bilaterally; no wheezing or rhonchi, mild crackles at his right posterior lung base, no respiratory distress or hypoxia ABD/GI: Normal bowel sounds; non-distended; soft, non-tender, no rebound, no guarding BACK:  The back appears normal and is non-tender to palpation, there is no CVA tenderness EXT: Normal ROM in all joints; non-tender to palpation; no edema; normal capillary refill; no cyanosis    SKIN: Normal color for age and race; warm NEURO: Moves all extremities equally PSYCH: The patient's mood and manner are appropriate. Grooming and personal hygiene are appropriate.  MEDICAL DECISION MAKING: Patient here with cough for 2 weeks and intermittent headaches. He  has some crackles at his right lower lung base on exam but no respiratory distress, hypoxia. Patient has a history of HIV but is currently stable with a normal CD4 count and undetectable viral load. He is completely incompetent. He discovered describe some weight loss and states he does live in a boarding house. His chest x-ray however shows no  pneumonia, nothing in his apices. Discussed with patient that he does have some risk factors for tuberculosis but this is less likely given he is in your competent at this time. We'll discharge with prescription for azithromycin for possible community acquired pneumonia/acute bronchitis.  We'll also discharge with naproxen for his intermittent headaches and guaifenesin with codeine for his cough. Have discussed with patient I recommend he followup with his primary care physician. Have discussed strict return precautions. Patient verbalizes understanding and is comfortable with plan.      [place], DO 09/14/13 1658

## 2013-09-14 NOTE — Discharge Instructions (Signed)

## 2013-09-16 NOTE — Discharge Planning (Signed)
[facility] was not able to see patient, [facility] orange card information and resource guide will be sent to the address listed. ° °

## 2013-10-04 ENCOUNTER — Other Ambulatory Visit (INDEPENDENT_AMBULATORY_CARE_PROVIDER_SITE_OTHER): Payer: Self-pay

## 2013-10-04 DIAGNOSIS — Z113 Encounter for screening for infections with a predominantly sexual mode of transmission: Secondary | ICD-10-CM

## 2013-10-04 DIAGNOSIS — B20 Human immunodeficiency virus [HIV] disease: Secondary | ICD-10-CM

## 2013-10-04 NOTE — Addendum Note (Signed)
Addended by: Jarrett Ables D on: 10/04/2013 05:28 PM   Modules accepted: Orders

## 2013-10-04 NOTE — Addendum Note (Signed)
Addended by: Dolan Amen D on: 10/04/2013 04:46 PM   Modules accepted: Orders

## 2013-10-05 LAB — T.PALLIDUM AB, TOTAL: T pallidum Antibodies (TP-PA): >8 S/CO — ABNORMAL HIGH (ref <0.90)

## 2013-10-05 LAB — RPR TITER: RPR Titer: 1:8 {titer}

## 2013-10-05 LAB — RPR: RPR: REACTIVE — AB

## 2013-10-06 LAB — T-HELPER CELL (CD4) - (RCID CLINIC ONLY)
CD4 % Helper T Cell: 21 % — ABNORMAL LOW (ref 33–55)
CD4 T Cell Abs: 590 /uL (ref 400–2700)

## 2013-10-08 LAB — HIV-1 RNA QUANT-NO REFLEX-BLD
HIV 1 RNA Quant: <20 copies/mL (ref <20)
HIV-1 RNA Quant, Log: <1.3 {Log} (ref <1.30)

## 2013-10-18 ENCOUNTER — Ambulatory Visit: Payer: Self-pay | Admitting: Internal Medicine

## 2013-10-20 ENCOUNTER — Ambulatory Visit: Payer: Self-pay | Admitting: Internal Medicine

## 2013-10-25 ENCOUNTER — Ambulatory Visit (INDEPENDENT_AMBULATORY_CARE_PROVIDER_SITE_OTHER): Payer: Self-pay | Admitting: Internal Medicine

## 2013-10-25 ENCOUNTER — Encounter: Payer: Self-pay | Admitting: Internal Medicine

## 2013-10-25 ENCOUNTER — Ambulatory Visit: Payer: Self-pay

## 2013-10-25 VITALS — BP 110/70 | HR 71 | Temp 98.4°F | Ht 71.0 in | Wt 152.5 lb

## 2013-10-25 DIAGNOSIS — A530 Latent syphilis, unspecified as early or late: Secondary | ICD-10-CM

## 2013-10-25 DIAGNOSIS — Z23 Encounter for immunization: Secondary | ICD-10-CM

## 2013-10-25 DIAGNOSIS — B20 Human immunodeficiency virus [HIV] disease: Secondary | ICD-10-CM

## 2013-10-25 MED ORDER — PENICILLIN G BENZATHINE 1200000 UNIT/2ML IM SUSP
1.2000 10*6.[IU] | Freq: Once | INTRAMUSCULAR | Status: AC
  Administered 2013-10-25: 1.2 10*6.[IU] via INTRAMUSCULAR

## 2013-10-25 MED ORDER — PENICILLIN G BENZATHINE 2400000 UNIT/4ML IM SUSP: INTRAMUSCULAR | Status: DC

## 2013-10-25 NOTE — Addendum Note (Signed)
Addended by: Lorne Skeens D on: 10/25/2013 11:05 AM   Modules accepted: Orders

## 2013-10-25 NOTE — Progress Notes (Signed)
Patient ID: Jeremiah [name], male   DOB: [birth year], 43 y.o.   MRN: [id number]          Patient Active Problem List   Diagnosis Date Noted  . Human immunodeficiency virus (HIV) disease 05/19/2013    Priority: High  . Dyslipidemia 05/19/2013  . Cigarette smoker 05/19/2013  . Depression 05/19/2013  . Dental caries 05/19/2013  . Hx of unilateral orchiectomy 05/19/2013  . Latent syphilis 05/06/2013  . Anal warts 05/06/2013  . History of chlamydia 05/06/2013    Patient's Medications  New Prescriptions   PENICILLIN G BENZATHINE 2400000 UNIT/4ML SUSP    2.4 million units IM weekly x3 doses  Previous Medications   AZITHROMYCIN (ZITHROMAX) 250 MG TABLET    Take 1 tablet (250 mg total) by mouth daily. Take daily starting 5/28   EMTRICITABINE-TENOFOVIR (TRUVADA) 200-300 MG PER TABLET    Take 1 tablet by mouth daily.   GUAIFENESIN-CODEINE 100-10 MG/5ML SYRUP    Take 5 mLs by mouth every 4 (four) hours as needed for cough.   LOPINAVIR-RITONAVIR (KALETRA) 200-50 MG PER TABLET    Take 2 tablets by mouth 2 (two) times daily.   NAPROXEN (NAPROSYN) 500 MG TABLET    Take 1 tablet (500 mg total) by mouth 2 (two) times daily.  Modified Medications   No medications on file  Discontinued Medications   No medications on file    Subjective: Jeremiah [name] is in for his routine visit. He continues to reside in the boarding house. He was treated with a Z-Pak and May for possible pneumonia. He is feeling better. He states that he is more interested in trying to quit smoking cigarettes. He denies missing any doses of his Truvada or Kaletra but ran out of Truvada yesterday. He knows that he needs to renew his ADAP certification. He says that he does not think that these completed all the requirements in order to get it arranged card so he has not been able to be referred to general surgery for his anal warts yet. Review of Systems: Pertinent items are noted in HPI.  Past Medical History  Diagnosis Date  . HIV  (human immunodeficiency virus infection)   . Pneumonia 2007  . History of tuberculin skin testing within last year 2014  . Anal warts     History  Substance Use Topics  . Smoking status: Current Every Day Smoker -- 1.00 packs/day for 24 years    Types: Cigarettes  . Smokeless tobacco: Never Used  . Alcohol Use: No    Family History  Problem Relation Age of Onset  . Diabetes Mother   . Hypertension Mother   . Cancer Mother     breast  . Diabetes Father   . Hypertension Father     No Known Allergies  Objective: Temp: 98.4 F (36.9 C) (07/07 1032) Temp src: Oral (07/07 1032) BP: 110/70 mmHg (07/07 1032) Pulse Rate: 71 (07/07 1032) Body mass index is 21.28 kg/(m^2).  General: He is in good spirits Oral: No oropharyngeal lesions Skin: No rash Lungs: Clear Cor: Regular S1 and S2 with no murmurs Mood and affect: Normal  Lab Results Lab Results  Component Value Date   WBC 8.3 09/14/2013   HGB 14.9 09/14/2013   HCT 42.5 09/14/2013   MCV 97.3 09/14/2013   PLT 174 09/14/2013    Lab Results  Component Value Date   CREATININE 1.08 09/14/2013   BUN 13 09/14/2013   NA 140 09/14/2013   K 4.3 09/14/2013  CL 104 09/14/2013   CO2 23 09/14/2013    Lab Results  Component Value Date   ALT 14 09/14/2013   AST 37 09/14/2013   ALKPHOS 80 09/14/2013   BILITOT 1.5* 09/14/2013    Lab Results  Component Value Date   CHOL 183 05/03/2013   HDL 55 05/03/2013   LDLCALC 93 05/03/2013   TRIG 177* 05/03/2013   CHOLHDL 3.3 05/03/2013    Lab Results HIV 1 RNA Quant (copies/mL)  Date Value  10/04/2013 <20   05/03/2013 <20      CD4 T Cell Abs (/uL)  Date Value  10/04/2013 590   05/03/2013 580      Assessment: His HIV infection remains under excellent control. Again, he does not want to simplify to a one pill daily regimen.  He has late latent syphilis and will treat him with 7.4 million units of benzathine penicillin over 3 weeks.  He is now more interested in trying to quit smoking. I  have counseled him on things he can do to come up with a plan to quit. I've also given him written information about the [place] quit line.  Plan: 1. Continue Truvada and Kaletra 2. Benzathine penicillin 2.4 million units IM weekly x3 3. Cigarette cessation counseling provided 4. ADAP recertification 5. Finish requirements for orange card 6. Followup after blood work in 6 months   Michel Bickers, MD [facility] for [place] [phone] pager   [phone] cell 10/25/2013, 10:49 AM

## 2013-11-01 ENCOUNTER — Ambulatory Visit (INDEPENDENT_AMBULATORY_CARE_PROVIDER_SITE_OTHER): Payer: Self-pay | Admitting: *Deleted

## 2013-11-01 DIAGNOSIS — A539 Syphilis, unspecified: Secondary | ICD-10-CM

## 2013-11-01 MED ORDER — PENICILLIN G BENZATHINE 1200000 UNIT/2ML IM SUSP: 1.2000 10*6.[IU] | Freq: Once | INTRAMUSCULAR | Status: DC

## 2013-11-08 ENCOUNTER — Ambulatory Visit (INDEPENDENT_AMBULATORY_CARE_PROVIDER_SITE_OTHER): Payer: Self-pay | Admitting: *Deleted

## 2013-11-08 DIAGNOSIS — A539 Syphilis, unspecified: Secondary | ICD-10-CM

## 2013-11-08 MED ORDER — PENICILLIN G BENZATHINE 1200000 UNIT/2ML IM SUSP
1.2000 10*6.[IU] | Freq: Once | INTRAMUSCULAR | Status: AC
  Administered 2013-11-08: 1.2 10*6.[IU] via INTRAMUSCULAR

## 2013-11-21 ENCOUNTER — Ambulatory Visit: Payer: Self-pay | Admitting: Internal Medicine

## 2014-01-11 ENCOUNTER — Emergency Department (HOSPITAL_COMMUNITY)
Admission: EM | Admit: 2014-01-11 | Discharge: 2014-01-11 | Disposition: A | Discharge status: Home / Self Care | Payer: Self-pay | Attending: Emergency Medicine | Admitting: Emergency Medicine

## 2014-01-11 ENCOUNTER — Encounter (HOSPITAL_COMMUNITY): Payer: Self-pay | Admitting: Emergency Medicine

## 2014-01-11 ENCOUNTER — Emergency Department (HOSPITAL_COMMUNITY): Payer: Self-pay

## 2014-01-11 DIAGNOSIS — Z79899 Other long term (current) drug therapy: Secondary | ICD-10-CM | POA: Insufficient documentation

## 2014-01-11 DIAGNOSIS — W19XXXA Unspecified fall, initial encounter: Secondary | ICD-10-CM

## 2014-01-11 DIAGNOSIS — M79671 Pain in right foot: Secondary | ICD-10-CM

## 2014-01-11 DIAGNOSIS — Y9289 Other specified places as the place of occurrence of the external cause: Secondary | ICD-10-CM | POA: Insufficient documentation

## 2014-01-11 DIAGNOSIS — S99919A Unspecified injury of unspecified ankle, initial encounter: Principal | ICD-10-CM

## 2014-01-11 DIAGNOSIS — W1789XA Other fall from one level to another, initial encounter: Secondary | ICD-10-CM | POA: Insufficient documentation

## 2014-01-11 DIAGNOSIS — Z8701 Personal history of pneumonia (recurrent): Secondary | ICD-10-CM | POA: Insufficient documentation

## 2014-01-11 DIAGNOSIS — M25511 Pain in right shoulder: Secondary | ICD-10-CM

## 2014-01-11 DIAGNOSIS — S8990XA Unspecified injury of unspecified lower leg, initial encounter: Secondary | ICD-10-CM | POA: Insufficient documentation

## 2014-01-11 DIAGNOSIS — Z21 Asymptomatic human immunodeficiency virus [HIV] infection status: Secondary | ICD-10-CM | POA: Insufficient documentation

## 2014-01-11 DIAGNOSIS — F17200 Nicotine dependence, unspecified, uncomplicated: Secondary | ICD-10-CM | POA: Insufficient documentation

## 2014-01-11 DIAGNOSIS — S46909A Unspecified injury of unspecified muscle, fascia and tendon at shoulder and upper arm level, unspecified arm, initial encounter: Secondary | ICD-10-CM | POA: Insufficient documentation

## 2014-01-11 DIAGNOSIS — S060X9A Concussion with loss of consciousness of unspecified duration, initial encounter: Secondary | ICD-10-CM | POA: Insufficient documentation

## 2014-01-11 DIAGNOSIS — Y9389 Activity, other specified: Secondary | ICD-10-CM | POA: Insufficient documentation

## 2014-01-11 DIAGNOSIS — B353 Tinea pedis: Secondary | ICD-10-CM | POA: Insufficient documentation

## 2014-01-11 DIAGNOSIS — M79675 Pain in left toe(s): Secondary | ICD-10-CM

## 2014-01-11 DIAGNOSIS — S99929A Unspecified injury of unspecified foot, initial encounter: Principal | ICD-10-CM

## 2014-01-11 DIAGNOSIS — W11XXXA Fall on and from ladder, initial encounter: Secondary | ICD-10-CM | POA: Insufficient documentation

## 2014-01-11 DIAGNOSIS — S4980XA Other specified injuries of shoulder and upper arm, unspecified arm, initial encounter: Secondary | ICD-10-CM | POA: Insufficient documentation

## 2014-01-11 MED ORDER — CEPHALEXIN 500 MG PO CAPS: 500.0000 mg | ORAL_CAPSULE | Freq: Four times a day (QID) | ORAL | Status: DC

## 2014-01-11 MED ORDER — CLOTRIMAZOLE 1 % EX CREA: 1.0000 "application " | TOPICAL_CREAM | Freq: Two times a day (BID) | CUTANEOUS | Status: DC

## 2014-01-11 MED ORDER — HYDROCODONE-ACETAMINOPHEN 5-325 MG PO TABS: 1.0000 | ORAL_TABLET | ORAL | Status: DC | PRN

## 2014-01-11 MED ORDER — HYDROCODONE-ACETAMINOPHEN 5-325 MG PO TABS
1.0000 | ORAL_TABLET | Freq: Once | ORAL | Status: AC
  Administered 2014-01-11: 1 via ORAL
  Filled 2014-01-11: qty 1

## 2014-01-11 NOTE — ED Provider Notes (Signed)
CSN: [id number]     Arrival date & time 01/11/14  1651 History   First MD Initiated Contact with Patient 01/11/14 2058     Chief Complaint  Patient presents with  . Foot Pain  . Ankle Pain  . 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 States that he fell to the ground and had brief loss of consciousness. States that the people with him stated he was out for only a few seconds. He's landed on his right side. Reports very mild pain in his head but complains mainly of right shoulder and right foot pain. Denies focal neurologic deficits. Had very mild relief with that he powders today. Denies any other bony tenderness and denies any other injury. Denies specifically neck, back, chest, abdominal pain. He has not vomited and has no change in his vision. He is walking well though with a limp due to the pain in his right foot.   Patient also notes that he has some tenderness between his fourth and fifth toes on his left foot that is an ongoing for a few days.  Thinks his 5th toe is swollen.  Patient does have a history of HIV he states is well-controlled with his last CD4 count over 500.  Denies fevers, discharge from the area.  Has tried no home treatments.    Past Medical History  Diagnosis Date  . HIV (human immunodeficiency virus infection)   . Pneumonia 2007  . History of tuberculin skin testing within last year 2014  . Anal warts    Past Surgical History  Procedure Laterality Date  . Hernia repair  1991    Abdominal   . Unilateral orchiectomy     Family History  Problem Relation Age of Onset  . Diabetes Mother   . Hypertension Mother   . Cancer Mother     breast  . Diabetes Father   . Hypertension Father    History  Substance Use Topics  . Smoking status: Current Every Day Smoker -- 1.00  packs/day for 24 years    Types: Cigarettes  . Smokeless tobacco: Never Used  . Alcohol Use: No    Review of Systems  All other systems reviewed and are negative.     Allergies  Review of patient's allergies indicates no known allergies.  Home Medications   Prior to Admission medications   Medication Sig Start Date End Date Taking? Authorizing Provider  emtricitabine-tenofovir (TRUVADA) 200-300 MG per tablet Take 1 tablet by mouth daily. 06/03/13  Yes Michel Bickers, MD  lopinavir-ritonavir (KALETRA) 200-50 MG per tablet Take 2 tablets by mouth 2 (two) times daily. 06/03/13  Yes Michel Bickers, MD   BP 112/79  Pulse 64  Temp(Src) 98.5 F (36.9 C) (Oral)  Resp 18  Ht 5\' 11"  (1.803 m)  Wt 155 lb (70.308 kg)  BMI 21.63 kg/m2  SpO2 100% Physical Exam  Nursing note and vitals reviewed. Constitutional: He appears well-developed and well-nourished. No distress.  HENT:  Head: Normocephalic and atraumatic.  Neck: Neck supple.  Pulmonary/Chest: Effort normal.  Musculoskeletal:       Feet:  Spine nontender, no crepitus, or stepoffs.   Neurological: He is alert.  CN II-XII intact, EOMs intact, no pronator drift, grip strengths equal bilaterally; strength 5/5 in all extremities, sensation intact in all extremities; finger to nose, heel to  shin, rapid alternating movements normal; gait is normal.     Skin: He is not diaphoretic.    ED Course  Procedures (including critical care time) Labs Review Labs Reviewed - No data to display  Imaging Review Dg Shoulder Right  01/11/2014   CLINICAL DATA:  [place] today, posterior right shoulder pain  EXAM: RIGHT SHOULDER - 2+ VIEW  COMPARISON:  None.  FINDINGS: There is no evidence of fracture or dislocation. There is no evidence of arthropathy or other focal bone abnormality. Soft tissues are unremarkable.  IMPRESSION: Negative.   Electronically Signed   By: Skipper Cliche M.D.   On: 01/11/2014 19:49   Dg Foot Complete Right  01/11/2014    CLINICAL DATA:  43 year old male with fall.  Right foot pain.  EXAM: RIGHT FOOT COMPLETE - 3+ VIEW  COMPARISON:  None.  FINDINGS: No acute bony abnormality. No significant soft tissue swelling. No radiopaque foreign body. Unremarkable appearance of the midfoot, with alignment maintained.  IMPRESSION: Negative for acute bony abnormality.  Signed,  Dulcy Fanny. Earleen Newport, DO  [facility]  [facility]   Electronically Signed   By: Corrie Mckusick O.D.   On: 01/11/2014 19:49     EKG Interpretation None      MDM   Final diagnoses:  Fall, initial encounter  Right shoulder pain  Right foot pain  Tinea pedis of left foot  Toe pain, left    Afebrile, nontoxic patient with fall from tree yesterday with LOC.  Neurologically intact. Pt reports only mild head pain.  He c/o right shoulder and right foot pain.  Xrays negative.  No AC joint tenderness.  Pt also with tinea pedis of the left foot with some cracking of the skin and tenderness of the toe.  The toe is no cellulitis and there is no e/o abscess on exam.  His CD4 count is >500.   D/C home with lotrimin, keflex (to prevent wound infection of the foot), norco.  PCP follow up.  Discussed result, findings, treatment, and follow up  with patient.  Pt given return precautions.  Pt verbalizes understanding and agrees with plan.       Last CD4 [address] [place] 01/12/14 [phone]

## 2014-01-11 NOTE — Discharge Instructions (Signed)
Read the information below.  Use the prescribed medication as directed.  Please discuss all new medications with your pharmacist.  Do not take additional tylenol while taking the prescribed pain medication to avoid overdose.  You may return to the Emergency Department at any time for worsening condition or any new symptoms that concern you.   If you develop uncontrolled pain, weakness or numbness of the extremity, severe discoloration of the skin, or you are unable to walk, return to the ER for a recheck.      Athlete's Foot Athlete's foot (tinea pedis) is a fungal infection of the skin on the feet. It often occurs on the skin between the toes or underneath the toes. It can also occur on the soles of the feet. Athlete's foot is more likely to occur in hot, humid weather. Not washing your feet or changing your socks often enough can contribute to athlete's foot. The infection can spread from person to person (contagious). CAUSES Athlete's foot is caused by a fungus. This fungus thrives in warm, moist places. Most people get athlete's foot by sharing shower stalls, towels, and wet floors with an infected person. People with weakened immune systems, including those with diabetes, may be more likely to get athlete's foot. SYMPTOMS   Itchy areas between the toes or on the soles of the feet.  White, flaky, or scaly areas between the toes or on the soles of the feet.  Tiny, intensely itchy blisters between the toes or on the soles of the feet.  Tiny cuts on the skin. These cuts can develop a bacterial infection.  Thick or discolored toenails. DIAGNOSIS  Your caregiver can usually tell what the problem is by doing a physical exam. Your caregiver may also take a skin sample from the rash area. The skin sample may be examined under a microscope, or it may be tested to see if fungus will grow in the sample. A sample may also be taken from your toenail for testing. TREATMENT  Over-the-counter and  prescription medicines can be used to kill the fungus. These medicines are available as powders or creams. Your caregiver can suggest medicines for you. Fungal infections respond slowly to treatment. You may need to continue using your medicine for several weeks. PREVENTION   Do not share towels.  Wear sandals in wet areas, such as shared locker rooms and shared showers.  Keep your feet dry. Wear shoes that allow air to circulate. Wear cotton or wool socks. HOME CARE INSTRUCTIONS   Take medicines as directed by your caregiver. Do not use steroid creams on athlete's foot.  Keep your feet clean and cool. Wash your feet daily and dry them thoroughly, especially between your toes.  Change your socks every day. Wear cotton or wool socks. In hot climates, you may need to change your socks 2 to 3 times per day.  Wear sandals or canvas tennis shoes with good air circulation.  If you have blisters, soak your feet in Burow's solution or Epsom salts for 20 to 30 minutes, 2 times a day to dry out the blisters. Make sure you dry your feet thoroughly afterward. SEEK MEDICAL CARE IF:   You have a fever.  You have swelling, soreness, warmth, or redness in your foot.  You are not getting better after 7 days of treatment.  You are not completely cured after 30 days.  You have any problems caused by your medicines. MAKE SURE YOU:   Understand these instructions.  Will watch your  condition.  Will get help right away if you are not doing well or get worse. Document Released: 04/04/2000 Document Revised: 06/30/2011 Document Reviewed: 01/24/2011 [facility] Patient Information 2015 [place], [place]. This information is not intended to replace advice given to you by your health care provider. Make sure you discuss any questions you have with your health care provider.  Musculoskeletal Pain Musculoskeletal pain is muscle and boney aches and pains. These pains can occur in any part of the body. Your  caregiver may treat you without knowing the cause of the pain. They may treat you if blood or urine tests, X-rays, and other tests were normal.  CAUSES There is often not a definite cause or reason for these pains. These pains may be caused by a type of germ (virus). The discomfort may also come from overuse. Overuse includes working out too hard when your body is not fit. Boney aches also come from weather changes. Bone is sensitive to atmospheric pressure changes. HOME CARE INSTRUCTIONS   Ask when your test results will be ready. Make sure you get your test results.  Only take over-the-counter or prescription medicines for pain, discomfort, or fever as directed by your caregiver. If you were given medications for your condition, do not drive, operate machinery or power tools, or sign legal documents for 24 hours. Do not drink alcohol. Do not take sleeping pills or other medications that may interfere with treatment.  Continue all activities unless the activities cause more pain. When the pain lessens, slowly resume normal activities. Gradually increase the intensity and duration of the activities or exercise.  During periods of severe pain, bed rest may be helpful. Lay or sit in any position that is comfortable.  Putting ice on the injured area.  Put ice in a bag.  Place a towel between your skin and the bag.  Leave the ice on for 15 to 20 minutes, 3 to 4 times a day.  Follow up with your caregiver for continued problems and no reason can be found for the pain. If the pain becomes worse or does not go away, it may be necessary to repeat tests or do additional testing. Your caregiver may need to look further for a possible cause. SEEK IMMEDIATE MEDICAL CARE IF:  You have pain that is getting worse and is not relieved by medications.  You develop chest pain that is associated with shortness or breath, sweating, feeling sick to your stomach (nauseous), or throw up (vomit).  Your pain  becomes localized to the abdomen.  You develop any new symptoms that seem different or that concern you. MAKE SURE YOU:   Understand these instructions.  Will watch your condition.  Will get help right away if you are not doing well or get worse. Document Released: 04/07/2005 Document Revised: 06/30/2011 Document Reviewed: 12/10/2012 [facility] Patient Information 2015 [place], [place]. This information is not intended to replace advice given to you by your health care provider. Make sure you discuss any questions you have with your health care provider.

## 2014-01-11 NOTE — ED Notes (Signed)
Pt. Stated, around 1330 today I started to have itching and pain in my right foot and ankle and goes up my leg.  It hurts and itching.  i had this once before.

## 2014-01-11 NOTE — ED Notes (Addendum)
Pt states that he fell off a ladder approx 12 feet yesterday evening. Pt states "i think i passed out out for a second."Pt states that the pain has gotten worse.  Pt reports rt shoulder pain, rt foot pain. Pt states that he has pain to his rt posterior head no obvious injury noted. A&ox4

## 2014-01-11 NOTE — ED Notes (Signed)
The documentation by Sanda Klein, RN is wrong documentation

## 2014-01-12 NOTE — ED Provider Notes (Signed)
Medical screening examination/treatment/procedure(s) were performed by non-physician practitioner and as supervising physician I was immediately available for consultation/collaboration.   EKG Interpretation None        Wandra Arthurs, MD 01/12/14 1050

## 2014-04-06 ENCOUNTER — Other Ambulatory Visit: Payer: Self-pay | Admitting: Internal Medicine

## 2014-04-19 ENCOUNTER — Other Ambulatory Visit: Payer: Self-pay | Admitting: *Deleted

## 2014-04-19 DIAGNOSIS — B20 Human immunodeficiency virus [HIV] disease: Secondary | ICD-10-CM

## 2014-04-19 MED ORDER — LOPINAVIR-RITONAVIR 200-50 MG PO TABS: 2.0000 | ORAL_TABLET | Freq: Two times a day (BID) | ORAL | Status: DC

## 2014-04-19 MED ORDER — EMTRICITABINE-TENOFOVIR DF 200-300 MG PO TABS: 1.0000 | ORAL_TABLET | Freq: Every day | ORAL | Status: DC

## 2014-04-21 HISTORY — PX: ORCHIECTOMY: SHX2116

## 2014-04-26 ENCOUNTER — Other Ambulatory Visit: Payer: Self-pay

## 2014-04-28 ENCOUNTER — Other Ambulatory Visit: Payer: Self-pay

## 2014-05-01 ENCOUNTER — Other Ambulatory Visit (INDEPENDENT_AMBULATORY_CARE_PROVIDER_SITE_OTHER): Payer: Self-pay

## 2014-05-01 DIAGNOSIS — Z79899 Other long term (current) drug therapy: Secondary | ICD-10-CM

## 2014-05-01 DIAGNOSIS — Z113 Encounter for screening for infections with a predominantly sexual mode of transmission: Secondary | ICD-10-CM

## 2014-05-01 DIAGNOSIS — B20 Human immunodeficiency virus [HIV] disease: Secondary | ICD-10-CM

## 2014-05-01 LAB — COMPREHENSIVE METABOLIC PANEL
ALBUMIN: 4 g/dL (ref 3.5–5.2)
ALK PHOS: 68 U/L (ref 39–117)
ALT: 14 U/L (ref 0–53)
AST: 23 U/L (ref 0–37)
BILIRUBIN TOTAL: 1 mg/dL (ref 0.2–1.2)
BUN: 10 mg/dL (ref 6–23)
CHLORIDE: 107 meq/L (ref 96–112)
CO2: 26 meq/L (ref 19–32)
CREATININE: 1.16 mg/dL (ref 0.50–1.35)
Calcium: 8.8 mg/dL (ref 8.4–10.5)
GLUCOSE: 79 mg/dL (ref 70–99)
POTASSIUM: 4.3 meq/L (ref 3.5–5.3)
Sodium: 140 mEq/L (ref 135–145)
TOTAL PROTEIN: 6.9 g/dL (ref 6.0–8.3)

## 2014-05-01 LAB — CBC
HEMATOCRIT: 43 % (ref 39.0–52.0)
Hemoglobin: 14.6 g/dL (ref 13.0–17.0)
MCH: 33.3 pg (ref 26.0–34.0)
MCHC: 34 g/dL (ref 30.0–36.0)
MCV: 97.9 fL (ref 78.0–100.0)
MPV: 10 fL (ref 8.6–12.4)
Platelets: 177 10*3/uL (ref 150–400)
RBC: 4.39 MIL/uL (ref 4.22–5.81)
RDW: 13.6 % (ref 11.5–15.5)
WBC: 5.6 10*3/uL (ref 4.0–10.5)

## 2014-05-01 LAB — LIPID PANEL
Cholesterol: 142 mg/dL (ref 0–200)
HDL: 55 mg/dL (ref >39)
LDL Cholesterol: 72 mg/dL (ref 0–99)
Total CHOL/HDL Ratio: 2.6 Ratio
Triglycerides: 76 mg/dL (ref <150)
VLDL: 15 mg/dL (ref 0–40)

## 2014-05-01 LAB — RPR: RPR Ser Ql: REACTIVE — AB

## 2014-05-01 LAB — RPR TITER

## 2014-05-02 LAB — T-HELPER CELL (CD4) - (RCID CLINIC ONLY)
CD4 T CELL HELPER: 19 % — AB (ref 33–55)
CD4 T Cell Abs: 500 /uL (ref 400–2700)

## 2014-05-02 LAB — HIV-1 RNA QUANT-NO REFLEX-BLD: HIV 1 RNA Quant: <20 copies/mL (ref <20)

## 2014-05-02 LAB — FLUORESCENT TREPONEMAL AB(FTA)-IGG-BLD: FLUORESCENT TREPONEMAL ABS: REACTIVE — AB

## 2014-05-11 ENCOUNTER — Encounter: Payer: Self-pay | Admitting: Internal Medicine

## 2014-05-11 ENCOUNTER — Ambulatory Visit (INDEPENDENT_AMBULATORY_CARE_PROVIDER_SITE_OTHER): Payer: Self-pay | Admitting: Internal Medicine

## 2014-05-11 VITALS — BP 120/75 | HR 61 | Temp 97.9°F | Wt 158.0 lb

## 2014-05-11 DIAGNOSIS — Z23 Encounter for immunization: Secondary | ICD-10-CM

## 2014-05-11 DIAGNOSIS — K029 Dental caries, unspecified: Secondary | ICD-10-CM

## 2014-05-11 DIAGNOSIS — B20 Human immunodeficiency virus [HIV] disease: Secondary | ICD-10-CM

## 2014-05-11 MED ORDER — ACETAMINOPHEN-CODEINE #3 300-30 MG PO TABS: 1.0000 | ORAL_TABLET | Freq: Four times a day (QID) | ORAL | Status: DC | PRN

## 2014-05-11 NOTE — Progress Notes (Signed)
Patient ID: Jeremiah [name], male   DOB: Jan 18, 1971, 43 y.o.   MRN: [id number]          Patient Active Problem List   Diagnosis Date Noted  . Human immunodeficiency virus (HIV) disease 05/19/2013    Priority: High  . Dyslipidemia 05/19/2013  . Cigarette smoker 05/19/2013  . Depression 05/19/2013  . Dental caries 05/19/2013  . Hx of unilateral orchiectomy 05/19/2013  . Latent syphilis 05/06/2013  . Anal warts 05/06/2013  . History of chlamydia 05/06/2013    Patient's Medications  New Prescriptions   ACETAMINOPHEN-CODEINE (TYLENOL #3) 300-30 MG PER TABLET    Take 1-2 tablets by mouth every 6 (six) hours as needed for moderate pain.  Previous Medications   CLOTRIMAZOLE (LOTRIMIN AF) 1 % CREAM    Apply 1 application topically 2 (two) times daily. To left foot between toes   EMTRICITABINE-TENOFOVIR (TRUVADA) 200-300 MG PER TABLET    Take 1 tablet by mouth daily.   LOPINAVIR-RITONAVIR (KALETRA) 200-50 MG PER TABLET    Take 2 tablets by mouth 2 (two) times daily.  Modified Medications   No medications on file  Discontinued Medications   CEPHALEXIN (KEFLEX) 500 MG CAPSULE    Take 1 capsule (500 mg total) by mouth 4 (four) times daily.   HYDROCODONE-ACETAMINOPHEN (NORCO/VICODIN) 5-325 MG PER TABLET    Take 1 tablet by mouth every 4 (four) hours as needed for moderate pain or severe pain.    Subjective: Jeremiah [name] is in for his routine visit. As usual he never misses a single dose of his Truvada or Kaletra. He is still smoking cigarettes and has no current plan to quit or strong desire to quit. He never completed the paperwork to get his Jeremiah [name] card that would allow him to return to the general surgeon to be seen again for his anal warts hemorrhoids. He has some pain in a crack left upper molar and unfortunately was unable to come to his dental appointment yesterday.  Review of Systems: Pertinent items are noted in HPI.  Past Medical History  Diagnosis Date  . HIV (human  immunodeficiency virus infection)   . Pneumonia 2007  . History of tuberculin skin testing within last year 2014  . Anal warts     History  Substance Use Topics  . Smoking status: Current Every Day Smoker -- 1.00 packs/day for 24 years    Types: Cigarettes  . Smokeless tobacco: Never Used  . Alcohol Use: 0.0 oz/week    0 Not specified per week     Comment: occasional    Family History  Problem Relation Age of Onset  . Diabetes Mother   . Hypertension Mother   . Cancer Mother     breast  . Diabetes Father   . Hypertension Father     No Known Allergies  Objective: Temp: 97.9 F (36.6 C) (01/21 0904) Temp Source: Oral (01/21 0904) BP: 120/75 mmHg (01/21 0904) Pulse Rate: 61 (01/21 0904) Body mass index is 22.05 kg/(m^2).  General: He is a thin male in no distress Oral: Cracked left molar Skin: No rash Lungs: Clear Cor: Regular S1 and S2 with no murmurs   Lab Results Lab Results  Component Value Date   WBC 5.6 05/01/2014   HGB 14.6 05/01/2014   HCT 43.0 05/01/2014   MCV 97.9 05/01/2014   PLT 177 05/01/2014    Lab Results  Component Value Date   CREATININE 1.16 05/01/2014   BUN 10 05/01/2014   NA 140 05/01/2014  K 4.3 05/01/2014   CL 107 05/01/2014   CO2 26 05/01/2014    Lab Results  Component Value Date   ALT 14 05/01/2014   AST 23 05/01/2014   ALKPHOS 68 05/01/2014   BILITOT 1.0 05/01/2014    Lab Results  Component Value Date   CHOL 142 05/01/2014   HDL 55 05/01/2014   LDLCALC 72 05/01/2014   TRIG 76 05/01/2014   CHOLHDL 2.6 05/01/2014    Lab Results HIV 1 RNA QUANT (copies/mL)  Date Value  05/01/2014 <20  10/04/2013 <20  05/03/2013 <20   CD4 T CELL ABS (/uL)  Date Value  05/01/2014 500  10/04/2013 590  05/03/2013 580   RPR 05/01/2014: Down to 4   Assessment: His HIV infection is under excellent control. He does not want to simplify his current regimen.  His syphilis is responding to treatment last spring.  I will give him  some Tylenol 3 for his dental pain and get him back on the list to see the dentist as soon as possible.  He will restart the process to get his Jeremiah [name] card.  I encouraged him to consider cigarette cessation.  Plan: 1. Continue Truvada and Kaletra 2. Tylenol 3 #60 prescribed to use as needed for pain 3. Dental clinic referral again 4. Follow-up after lab work in [place] months   Jeremiah [name], [place] [facility] [place] [phone] pager   [phone] cell 05/11/2014, 9:16 AM

## 2014-05-11 NOTE — Addendum Note (Signed)
Addended by: Myrtis Hopping A on: 05/11/2014 09:34 AM   Modules accepted: Orders

## 2014-09-27 ENCOUNTER — Ambulatory Visit: Payer: Self-pay | Admitting: *Deleted

## 2014-09-27 DIAGNOSIS — F1410 Cocaine abuse, uncomplicated: Secondary | ICD-10-CM

## 2014-09-27 DIAGNOSIS — F1010 Alcohol abuse, uncomplicated: Secondary | ICD-10-CM

## 2014-09-27 NOTE — BH Specialist Note (Signed)
Jeremiah [name] was present today for his scheduled appointment with counselor.  Client communicated that he was referred by Myriam Jacobson from [facility].  Client was oriented time four with good affect and dress.  Client was alert and talkative.  Client has no children and has never been married. Client did not appear to be listening carefully as evidenced by saying yes to questions that should have answered no and vice versa.  Client sometimes responded in a way that indicating he was not listening well.  Client did share that he is presently drinking alcohol daily and smoking crack cocaine 3-4 days a week.  Client stated that he tends to use up all his money or the majority of it on drugs and alcohol.  Client stated that his lights were off right now because he used the money he would have paid his light bill on cocaine. Client communicated that he has been smoking crack since he was 44 years old.  Client said that he knows he has a habit and wants desperately to kick the habit. Client says that having money in his hands is his biggest trigger.  Client shared that he becomes impulsive and makes unwise decisions.  Client stated that he had substance abuse treatment about 15 years ago but it did not help much and was temporary. Client told counselor that he wants to try and get clean by July 10th because he was going to [facility] to visit with his family and wanted his mother to see him clean.  Counselor encouraged client to consider outpatient treatment and made a referral with [facility].  Client stated he would go for an appointment and complete the assessment.  Counselor educated client on early recovery and the signs of addiction.  Client agreed he has a problem but fears he will not be successful.  Counselor explained to client that no effort guarantees failure.  Counselor shared that recovery will be a life long journey and is a day to day process.  Client accepted the referral and made an appointment with [facility] for next Tuesday  the 14 th at [place].  Client also made an additional appointment with counselor to follow up on Thursday the 2 th.  Rolena Infante, LPCA, MA [facility]

## 2014-10-05 ENCOUNTER — Ambulatory Visit: Payer: Self-pay | Admitting: *Deleted

## 2014-11-06 ENCOUNTER — Other Ambulatory Visit (INDEPENDENT_AMBULATORY_CARE_PROVIDER_SITE_OTHER): Payer: Self-pay

## 2014-11-06 DIAGNOSIS — B20 Human immunodeficiency virus [HIV] disease: Secondary | ICD-10-CM

## 2014-11-06 DIAGNOSIS — Z113 Encounter for screening for infections with a predominantly sexual mode of transmission: Secondary | ICD-10-CM

## 2014-11-06 LAB — RPR TITER: RPR Titer: 1:2 {titer}

## 2014-11-06 LAB — RPR: RPR Ser Ql: REACTIVE — AB

## 2014-11-06 NOTE — Addendum Note (Signed)
Addended by: Dolan Amen D on: 11/06/2014 05:28 PM   Modules accepted: Orders

## 2014-11-06 NOTE — Addendum Note (Signed)
Addended by: Reggy Eye on: 11/06/2014 05:07 PM   Modules accepted: Orders

## 2014-11-07 LAB — FLUORESCENT TREPONEMAL AB(FTA)-IGG-BLD: Fluorescent Treponemal ABS: REACTIVE — AB

## 2014-11-07 LAB — HIV-1 RNA QUANT-NO REFLEX-BLD: HIV 1 RNA Quant: <20 copies/mL (ref <20)

## 2014-11-07 LAB — T-HELPER CELL (CD4) - (RCID CLINIC ONLY)
CD4 T CELL ABS: 490 /uL (ref 400–2700)
CD4 T CELL HELPER: 24 % — AB (ref 33–55)

## 2014-11-08 LAB — URINE CYTOLOGY ANCILLARY ONLY
Chlamydia: NEGATIVE
Neisseria Gonorrhea: NEGATIVE

## 2014-11-21 ENCOUNTER — Encounter: Payer: Self-pay | Admitting: Internal Medicine

## 2014-11-21 ENCOUNTER — Ambulatory Visit (INDEPENDENT_AMBULATORY_CARE_PROVIDER_SITE_OTHER): Payer: Self-pay | Admitting: Internal Medicine

## 2014-11-21 ENCOUNTER — Ambulatory Visit: Payer: Self-pay

## 2014-11-21 VITALS — BP 119/79 | HR 60 | Temp 98.4°F | Wt 153.5 lb

## 2014-11-21 DIAGNOSIS — K029 Dental caries, unspecified: Secondary | ICD-10-CM

## 2014-11-21 DIAGNOSIS — B20 Human immunodeficiency virus [HIV] disease: Secondary | ICD-10-CM

## 2014-11-21 MED ORDER — ACETAMINOPHEN-CODEINE #3 300-30 MG PO TABS: 1.0000 | ORAL_TABLET | Freq: Four times a day (QID) | ORAL | Status: DC | PRN

## 2014-11-21 NOTE — Progress Notes (Signed)
Patient ID: Jeremiah [name], male   DOB: 1970-07-10, 44 y.o.   MRN: [id number]          Patient Active Problem List   Diagnosis Date Noted  . Human immunodeficiency virus (HIV) disease 05/19/2013    Priority: High  . Dyslipidemia 05/19/2013  . Cigarette smoker 05/19/2013  . Depression 05/19/2013  . Dental caries 05/19/2013  . Hx of unilateral orchiectomy 05/19/2013  . Latent syphilis 05/06/2013  . Anal warts 05/06/2013  . History of chlamydia 05/06/2013    Patient's Medications  New Prescriptions   No medications on file  Previous Medications   CLOTRIMAZOLE (LOTRIMIN AF) 1 % CREAM    Apply 1 application topically 2 (two) times daily. To left foot between toes   EMTRICITABINE-TENOFOVIR (TRUVADA) 200-300 MG PER TABLET    Take 1 tablet by mouth daily.   LOPINAVIR-RITONAVIR (KALETRA) 200-50 MG PER TABLET    Take 2 tablets by mouth 2 (two) times daily.  Modified Medications   Modified Medication Previous Medication   ACETAMINOPHEN-CODEINE (TYLENOL #3) 300-30 MG PER TABLET acetaminophen-codeine (TYLENOL #3) 300-30 MG per tablet      Take 1-2 tablets by mouth every 6 (six) hours as needed for moderate pain.    Take 1-2 tablets by mouth every 6 (six) hours as needed for moderate pain.  Discontinued Medications   No medications on file    Subjective: Jeremiah [name] is in for his routine HIV follow-up visit. He has not missed any doses of his Truvada or Kaletra since his last visit. He continues to smoke at least a pack of cigarettes daily and has no current plan to quit. He has had oral sex with one partner since his last visit. He is having some recurrent pain in a right maxillary molar. He is still bothered by his anal warts. He has not turned in paperwork for his new orange card yet so he could be seen back by the general surgeons.  Review of Systems: Constitutional: negative for anorexia, fevers and weight loss Eyes: negative Ears, nose, mouth, throat, and face: negative Respiratory:  negative Cardiovascular: negative Gastrointestinal: negative Genitourinary:negative  Past Medical History  Diagnosis Date  . HIV (human immunodeficiency virus infection)   . Pneumonia 2007  . History of tuberculin skin testing within last year 2014  . Anal warts     History  Substance Use Topics  . Smoking status: Current Every Day Smoker -- 1.00 packs/day for 24 years    Types: Cigarettes  . Smokeless tobacco: Never Used  . Alcohol Use: 0.0 oz/week    0 Standard drinks or equivalent per week     Comment: occasional    Family History  Problem Relation Age of Onset  . Diabetes Mother   . Hypertension Mother   . Cancer Mother     breast  . Diabetes Father   . Hypertension Father     No Known Allergies  Objective: Temp: 98.4 F (36.9 C) (08/02 0902) Temp Source: Oral (08/02 0902) BP: 119/79 mmHg (08/02 0902) Pulse Rate: 60 (08/02 0902) Body mass index is 21.42 kg/(m^2).  General: His weight is stable at 153 pounds Oral: No oropharyngeal lesions Skin: No rash Lungs: Clear Cor: Regular S1 and S2 with no murmurs Abdomen: Nontender Mood: Normal. He does not appear anxious or depressed  Lab Results Lab Results  Component Value Date   WBC 5.6 05/01/2014   HGB 14.6 05/01/2014   HCT 43.0 05/01/2014   MCV 97.9 05/01/2014   PLT 177 05/01/2014  Lab Results  Component Value Date   CREATININE 1.16 05/01/2014   BUN 10 05/01/2014   NA 140 05/01/2014   K 4.3 05/01/2014   CL 107 05/01/2014   CO2 26 05/01/2014    Lab Results  Component Value Date   ALT 14 05/01/2014   AST 23 05/01/2014   ALKPHOS 68 05/01/2014   BILITOT 1.0 05/01/2014    Lab Results  Component Value Date   CHOL 142 05/01/2014   HDL 55 05/01/2014   LDLCALC 72 05/01/2014   TRIG 76 05/01/2014   CHOLHDL 2.6 05/01/2014    Lab Results HIV 1 RNA QUANT (copies/mL)  Date Value  11/06/2014 <20  05/01/2014 <20  10/04/2013 <20   CD4 T CELL ABS (/uL)  Date Value  11/06/2014 490    05/01/2014 500  10/04/2013 590     Assessment: His HIV infection remains under excellent control. He does not want to simplify his anti-retroviral regimen.  He is having dental pain. We will get him on the waiting list for the dental clinic. I will give him a supply of Tylenol No. 3.  I talked to him again about the importance of quitting cigarettes.  I will refer him to my partner, Dr. Johnnye Sima, for high resolution anoscopy. Also encouraged him to fill out paperwork for his orange card so we can refer him back to the general surgeons.  Plan: 1. Continue Truvada and Kaletra 2. Tylenol No. 3, #30 to take as needed for pain 3. Cigarette cessation counseling provided 4. Refer for high resolution anoscopy 5. Follow-up here in 6 months after blood work   Michel Bickers, MD [facility] for [place] [phone] pager   [phone] cell 11/21/2014, 9:20 AM

## 2014-12-15 ENCOUNTER — Emergency Department (HOSPITAL_COMMUNITY)
Admission: EM | Admit: 2014-12-15 | Discharge: 2014-12-15 | Disposition: A | Discharge status: Home / Self Care | Payer: No Typology Code available for payment source | Attending: Emergency Medicine | Admitting: Emergency Medicine

## 2014-12-15 ENCOUNTER — Encounter (HOSPITAL_COMMUNITY): Payer: Self-pay | Admitting: Emergency Medicine

## 2014-12-15 DIAGNOSIS — Y92410 Unspecified street and highway as the place of occurrence of the external cause: Secondary | ICD-10-CM | POA: Insufficient documentation

## 2014-12-15 DIAGNOSIS — H6123 Impacted cerumen, bilateral: Secondary | ICD-10-CM | POA: Diagnosis not present

## 2014-12-15 DIAGNOSIS — Y998 Other external cause status: Secondary | ICD-10-CM | POA: Insufficient documentation

## 2014-12-15 DIAGNOSIS — Z79899 Other long term (current) drug therapy: Secondary | ICD-10-CM | POA: Insufficient documentation

## 2014-12-15 DIAGNOSIS — G44209 Tension-type headache, unspecified, not intractable: Secondary | ICD-10-CM | POA: Insufficient documentation

## 2014-12-15 DIAGNOSIS — S46812A Strain of other muscles, fascia and tendons at shoulder and upper arm level, left arm, initial encounter: Secondary | ICD-10-CM | POA: Insufficient documentation

## 2014-12-15 DIAGNOSIS — S0990XA Unspecified injury of head, initial encounter: Secondary | ICD-10-CM | POA: Diagnosis not present

## 2014-12-15 DIAGNOSIS — Z21 Asymptomatic human immunodeficiency virus [HIV] infection status: Secondary | ICD-10-CM | POA: Insufficient documentation

## 2014-12-15 DIAGNOSIS — Z8701 Personal history of pneumonia (recurrent): Secondary | ICD-10-CM | POA: Insufficient documentation

## 2014-12-15 DIAGNOSIS — Y9389 Activity, other specified: Secondary | ICD-10-CM | POA: Insufficient documentation

## 2014-12-15 DIAGNOSIS — S199XXA Unspecified injury of neck, initial encounter: Secondary | ICD-10-CM | POA: Diagnosis not present

## 2014-12-15 DIAGNOSIS — Z720 Tobacco use: Secondary | ICD-10-CM | POA: Insufficient documentation

## 2014-12-15 DIAGNOSIS — Z8619 Personal history of other infectious and parasitic diseases: Secondary | ICD-10-CM | POA: Insufficient documentation

## 2014-12-15 DIAGNOSIS — S4992XA Unspecified injury of left shoulder and upper arm, initial encounter: Secondary | ICD-10-CM | POA: Diagnosis present

## 2014-12-15 MED ORDER — PREDNISONE 20 MG PO TABS
60.0000 mg | ORAL_TABLET | Freq: Once | ORAL | Status: AC
  Administered 2014-12-15: 60 mg via ORAL
  Filled 2014-12-15: qty 3

## 2014-12-15 MED ORDER — PREDNISONE 20 MG PO TABS: 40.0000 mg | ORAL_TABLET | Freq: Every day | ORAL | Status: DC

## 2014-12-15 MED ORDER — METHOCARBAMOL 500 MG PO TABS: 500.0000 mg | ORAL_TABLET | Freq: Two times a day (BID) | ORAL | Status: DC

## 2014-12-15 MED ORDER — METHOCARBAMOL 500 MG PO TABS
500.0000 mg | ORAL_TABLET | Freq: Once | ORAL | Status: AC
  Administered 2014-12-15: 500 mg via ORAL
  Filled 2014-12-15: qty 1
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
## 2014-12-18 ENCOUNTER — Emergency Department (HOSPITAL_COMMUNITY)
Admission: EM | Admit: 2014-12-18 | Discharge: 2014-12-18 | Disposition: A | Discharge status: Home / Self Care | Payer: No Typology Code available for payment source | Attending: Emergency Medicine | Admitting: Emergency Medicine

## 2014-12-18 ENCOUNTER — Encounter (HOSPITAL_COMMUNITY): Payer: Self-pay | Admitting: Vascular Surgery

## 2014-12-18 DIAGNOSIS — S3992XA Unspecified injury of lower back, initial encounter: Secondary | ICD-10-CM | POA: Insufficient documentation

## 2014-12-18 DIAGNOSIS — Z720 Tobacco use: Secondary | ICD-10-CM | POA: Insufficient documentation

## 2014-12-18 DIAGNOSIS — Z79899 Other long term (current) drug therapy: Secondary | ICD-10-CM | POA: Diagnosis not present

## 2014-12-18 DIAGNOSIS — B20 Human immunodeficiency virus [HIV] disease: Secondary | ICD-10-CM | POA: Diagnosis not present

## 2014-12-18 DIAGNOSIS — Z8619 Personal history of other infectious and parasitic diseases: Secondary | ICD-10-CM | POA: Insufficient documentation

## 2014-12-18 DIAGNOSIS — Y998 Other external cause status: Secondary | ICD-10-CM | POA: Diagnosis not present

## 2014-12-18 DIAGNOSIS — Z8701 Personal history of pneumonia (recurrent): Secondary | ICD-10-CM | POA: Diagnosis not present

## 2014-12-18 DIAGNOSIS — S299XXA Unspecified injury of thorax, initial encounter: Secondary | ICD-10-CM | POA: Diagnosis not present

## 2014-12-18 DIAGNOSIS — S199XXA Unspecified injury of neck, initial encounter: Secondary | ICD-10-CM | POA: Insufficient documentation

## 2014-12-18 DIAGNOSIS — M549 Dorsalgia, unspecified: Secondary | ICD-10-CM

## 2014-12-18 DIAGNOSIS — S4992XA Unspecified injury of left shoulder and upper arm, initial encounter: Secondary | ICD-10-CM | POA: Insufficient documentation

## 2014-12-18 DIAGNOSIS — Y92410 Unspecified street and highway as the place of occurrence of the external cause: Secondary | ICD-10-CM | POA: Insufficient documentation

## 2014-12-18 DIAGNOSIS — Y9389 Activity, other specified: Secondary | ICD-10-CM | POA: Diagnosis not present

## 2014-12-18 MED ORDER — ACETAMINOPHEN 500 MG PO TABS: 500.0000 mg | ORAL_TABLET | Freq: Four times a day (QID) | ORAL | Status: DC | PRN

## 2014-12-18 MED ORDER — METHOCARBAMOL 500 MG PO TABS: 500.0000 mg | ORAL_TABLET | Freq: Two times a day (BID) | ORAL | Status: DC
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
## 2014-12-18 NOTE — Discharge Instructions (Signed)
1. Medications: tylenol, robaxin, usual home medications 2. Treatment: rest, drink plenty of fluids, back exercises, ice 3. Follow Up: please followup with your primary doctor this week for discussion of your diagnoses and further evaluation after today's visit; if you do not have a primary care doctor use the resource guide provided to find one; please return to the ER for bowel or bladder incontinence, difficulty walking, fever, chills, new or worsening symptoms   Back Pain, Adult Low back pain is very common. About 1 in 5 people have back pain.The cause of low back pain is rarely dangerous. The pain often gets better over time.About half of people with a sudden onset of back pain feel better in just 2 weeks. About 8 in 10 people feel better by 6 weeks.  CAUSES Some common causes of back pain include:  Strain of the muscles or ligaments supporting the spine.  Wear and tear (degeneration) of the spinal discs.  Arthritis.  Direct injury to the back. DIAGNOSIS Most of the time, the direct cause of low back pain is not known.However, back pain can be treated effectively even when the exact cause of the pain is unknown.Answering your caregiver's questions about your overall health and symptoms is one of the most accurate ways to make sure the cause of your pain is not dangerous. If your caregiver needs more information, he or she may order lab work or imaging tests (X-rays or MRIs).However, even if imaging tests show changes in your back, this usually does not require surgery. HOME CARE INSTRUCTIONS For many people, back pain returns.Since low back pain is rarely dangerous, it is often a condition that people can learn to [facility] their own.   Remain active. It is stressful on the back to sit or stand in one place. Do not sit, drive, or stand in one place for more than 30 minutes at a time. Take short walks on level surfaces as soon as pain allows.Try to increase the length of time you  walk each day.  Do not stay in bed.Resting more than 1 or 2 days can delay your recovery.  Do not avoid exercise or work.Your body is made to move.It is not dangerous to be active, even though your back may hurt.Your back will likely heal faster if you return to being active before your pain is gone.  Pay attention to your body when you bend and lift. Many people have less discomfortwhen lifting if they bend their knees, keep the load close to their bodies,and avoid twisting. Often, the most comfortable positions are those that put less stress on your recovering back.  Find a comfortable position to sleep. Use a firm mattress and lie on your side with your knees slightly bent. If you lie on your back, put a pillow under your knees.  Only take over-the-counter or prescription medicines as directed by your caregiver. Over-the-counter medicines to reduce pain and inflammation are often the most helpful.Your caregiver may prescribe muscle relaxant drugs.These medicines help dull your pain so you can more quickly return to your normal activities and healthy exercise.  Put ice on the injured area.  Put ice in a plastic bag.  Place a towel between your skin and the bag.  Leave the ice on for 15-20 minutes, 03-04 times a day for the first 2 to 3 days. After that, ice and heat may be alternated to reduce pain and spasms.  Ask your caregiver about trying back exercises and gentle massage. This may be of  some benefit.  Avoid feeling anxious or stressed.Stress increases muscle tension and can worsen back pain.It is important to recognize when you are anxious or stressed and learn ways to manage it.Exercise is a great option. SEEK MEDICAL CARE IF:  You have pain that is not relieved with rest or medicine.  You have pain that does not improve in 1 week.  You have new symptoms.  You are generally not feeling well. SEEK IMMEDIATE MEDICAL CARE IF:   You have pain that radiates from your  back into your legs.  You develop new bowel or bladder control problems.  You have unusual weakness or numbness in your arms or legs.  You develop nausea or vomiting.  You develop abdominal pain.  You feel faint. Document Released: 04/07/2005 Document Revised: 10/07/2011 Document Reviewed: 08/09/2013 [facility] Patient Information 2015 [place], [place]. This information is not intended to replace advice given to you by your health care provider. Make sure you discuss any questions you have with your health care provider.  Back Exercises Back exercises help treat and prevent back injuries. The goal is to increase your strength in your belly (abdominal) and back muscles. These exercises can also help with flexibility. Start these exercises when told by your doctor. HOME CARE Back exercises include: Pelvic Tilt.  Lie on your back with your knees bent. Tilt your pelvis until the lower part of your back is against the floor. Hold this position 5 to 10 sec. Repeat this exercise 5 to 10 times. Knee to Chest.  Pull 1 knee up against your chest and hold for 20 to 30 seconds. Repeat this with the other knee. This may be done with the other leg straight or bent, whichever feels better. Then, pull both knees up against your chest. Sit-Ups or Curl-Ups.  Bend your knees 90 degrees. Start with tilting your pelvis, and do a partial, slow sit-up. Only lift your upper half 30 to 45 degrees off the floor. Take at least 2 to 3 seonds for each sit-up. Do not do sit-ups with your knees out straight. If partial sit-ups are difficult, simply do the above but with only tightening your belly (abdominal) muscles and holding it as told. Hip-Lift.  Lie on your back with your knees flexed 90 degrees. Push down with your feet and shoulders as you raise your hips 2 inches off the floor. Hold for 10 seconds, repeat 5 to 10 times. Back Arches.  Lie on your stomach. Prop yourself up on bent elbows. Slowly press on your  hands, causing an arch in your low back. Repeat 3 to 5 times. Shoulder-Lifts.  Lie face down with arms beside your body. Keep hips and belly pressed to floor as you slowly lift your head and shoulders off the floor. Do not overdo your exercises. Be careful in the beginning. Exercises may cause you some mild back discomfort. If the pain lasts for more than 15 minutes, stop the exercises until you see your doctor. Improvement with exercise for back problems is slow.  Document Released: 05/10/2010 Document Revised: 06/30/2011 Document Reviewed: 02/06/2011 [facility] Patient Information 2015 [place], [place]. This information is not intended to replace advice given to you by your health care provider. Make sure you discuss any questions you have with your health care provider.   Emergency Department Resource Guide 1) Find a Doctor and Pay Out of Pocket Although you won't have to find out who is covered by your insurance plan, it is a good idea to ask around and  get recommendations. You will then need to call the office and see if the doctor you have chosen will accept you as a new patient and what types of options they offer for patients who are self-pay. Some doctors offer discounts or will set up payment plans for their patients who do not have insurance, but you will need to ask so you aren't surprised when you get to your appointment.  2) Contact Your Local Health Department Not all health departments have doctors that can see patients for sick visits, but many do, so it is worth a call to see if yours does. If you don't know where your local health department is, you can check in your phone book. The CDC also has a tool to help you locate your state's health department, and many state websites also have listings of all of their local health departments.  3) Find a [facility] If your illness is not likely to be very severe or complicated, you may want to try a walk in clinic. These are popping up  all over the country in pharmacies, drugstores, and shopping centers. They're usually staffed by nurse practitioners or physician assistants that have been trained to treat common illnesses and complaints. They're usually fairly quick and inexpensive. However, if you have serious medical issues or chronic medical problems, these are probably not your best option.  No Primary Care Doctor: - Call Health Connect at  [phone] - they can help you locate a primary care doctor that  accepts your insurance, provides certain services, etc. - Physician Referral Service- [phone]  Chronic Pain Problems: Organization         Address  Phone   Notes  [facility]  [phone] Patients need to be referred by their primary care doctor.   Medication Assistance: Organization         Address  Phone   Notes  [facility] Medication [facility] [place] [place], [place] [address] [phone] --Must be a resident of [facility] -- Must have NO insurance coverage whatsoever (no Medicaid/ Medicare, etc.) -- The pt. MUST have a primary care doctor that directs their care regularly and follows them in the community   MedAssist  [phone]   [employer]  [phone]    Agencies that provide inexpensive medical care: Organization         Address  Phone   Notes  [place]  [phone]   [facility]    [phone]   [facility] [place], [place] [address] [phone]   [place] [address], [place] [phone]   Planned Parenthood    [phone]   [facility]    [phone]   [place] [address] [place], [place] Phone:  [phone], Fax:  [phone] Hours of Operation:  9 am - 6 pm, M-F.  Also accepts Medicaid/Medicare and self-pay.  [facility] for [address], [address], [place] Phone: [phone], Fax: [phone]. Hours of Operation:  8:30 am - 5:30 pm, M-F.  Also accepts Medicaid and self-pay.  [facility] [place] [address], [place] Phone: [phone]   [place], [place], [place] [phone], Ext. [phone] Mondays & Thursdays: 7-9 AM.  First 15 patients are seen on a  first come, first serve basis.    [place] Providers:  Organization         Address  Phone   Notes  [facility] [address], [place] [phone] Also accepts self-pay patients.  [facility] [address], [place]  [phone]   [place], [address], [place] [phone]   [facility] [address], [place] [phone]   Lucianne Lei [address], [place]   [phone] Only accepts [place] Access [place] patients after they have their name applied to their card.   Self-Pay (no insurance) in [facility]:  Organization         Address  Phone   Notes  Sickle Cell Patients, [facility] [place] [phone]   [facility] [place] [phone]   [facility] [place]  [place], [place], [place] [phone]   [facility]/Dr. Osei-Bonsu  [address] [place] or [address], [place] [phone] Phone number for both [place] and [place] locations is the same.  [facility] [address], [place] [phone]   [facility] [address] [place] or [address] [phone]   [facility] [address] [place] [phone], phone; [phone], fax Sees patients 1st and 3rd Saturday of every  month.  Must not qualify for public or private insurance (i.e. Medicaid, Medicare, [place] Health Choice, Veterans' Benefits)  Household income should be no more than 200% of the poverty level The clinic cannot treat you if you are pregnant or think you are pregnant  Sexually transmitted diseases are not treated at the clinic.    Dental Care: Organization         Address  Phone  Notes  [facility] Department of [facility] [place] [phone] Accepts children up to age 42 who are enrolled in [place] or [place]; pregnant women with a Medicaid card; and children who have applied for Medicaid or [place] Health Choice, but were declined, whose parents can pay a reduced fee at time of service.  [facility] Department of [facility]  [address], [place] [phone] Accepts children up to age 56 who are enrolled in [place] or [place]; pregnant women with a Medicaid card; and children who have applied for Medicaid or [place] Health Choice, but were declined, whose parents can pay a reduced fee at time of service.  [facility] Adult Dental Access PROGRAM  [place] [phone] Patients are seen by appointment only. Walk-ins are not accepted. [place] will see patients 60 years of age and older. Monday - Tuesday (8am-5pm) Most Wednesdays (8:30-5pm) $30 per visit, cash only  [facility] Adult Dental Access PROGRAM  [address], [facility] [phone] Patients are seen by appointment only. Walk-ins are not accepted. [place] will see patients 42 years of age and older. One Wednesday Evening (Monthly: Volunteer Based).  $30 per visit, cash only  [place]  [phone] for adults; Children under age 2, call Graduate Pediatric Dentistry at [phone]. Children aged 84-14,  please call [phone] to request a pediatric application.  Dental  services are provided in all areas of dental care including fillings, crowns and bridges, complete and partial dentures, implants, gum treatment, root canals, and extractions. Preventive care is also provided. Treatment is provided to both adults and children. Patients are selected via a lottery and there is often a waiting list.   [facility] [address], [place]  [phone] [email or web address]   [facility] [address] [place], [place] [phone], Ext. [phone] Second and Fourth Thursday of each month, opens at 6:30 AM; Clinic ends at 9 AM.  Patients are seen on a first-come first-served basis, and a limited number are seen during each clinic.   [facility]  [address] Hillard Danker [place], [place] [phone]   Eligibility Requirements You must have lived in [place], [place], or [place] counties for at least the last three months.   You cannot be eligible for state or federal sponsored [employer], including [employer], [place], or [employer].   You generally cannot be eligible for healthcare insurance through your employer.    How to apply: Eligibility screenings are held every Tuesday and Wednesday afternoon from 1:00 pm until 4:00 pm. You do not need an appointment for the interview!  [facility] [address], Olivia Lopez de [place]ierrez, [place]   [place]  [place] Department  [place]  [phone]    Behavioral Health Resources in the Community: Intensive Outpatient Programs Organization         Address  Phone  Notes  Clio Homer. [address] [place], [place] [phone]   [facility] [address], [place], [place]   ADS: Alcohol & Drug Svcs [address], [place], [place]   [place] [address]    [place], [place] or [phone]   Substance Abuse Resources Organization         Address  Phone  Notes  Alcohol and Drug Services  [phone]   [place]  [phone]   [facility] [place]   Chinita Pester  [phone]   Residential & Outpatient Substance Abuse Program  [phone]   Psychological Services Organization         Address  Phone  Notes  [facility] [place]  [place]  [phone]   [place] [address] [place] or [phone]    Mobile Crisis Teams Organization         Address  Phone  Notes  [facility], Mobile Crisis Care Unit  [phone]   [facility]  [address] [place], [place]   [place] [address] [place] [phone]    Self-Help/Support Groups Organization         Address  Phone             Notes  [place]. of [place] - variety of support groups  [place] Call for more information  Narcotics Anonymous (NA), Caring Services [address], [place]  2 meetings at this location   Special educational needs teacher         Address  Phone  Notes  ASAP Residential Treatment [place],    [place]  [phone]  [facility]  [address], [place] [id number], [place], [place]   [place],  [phone] Admissions: 8am-3pm M-F  Incentives Substance [place] [address],    [place], [place] [phone]   [facility] [address] [place], [place], [place]   [facility] [address]  [place], [place]   [facility] Programs - Intensive Outpatient [address] Kristeen Mans [address], [place], [place]   [facility] [place].,  [place], [place]  [phone] or [phone]   [facility] (RTS) [address] [place], [place] Accepts Medicaid  [facility] [place] [address]  [place] [phone] Substance Abuse/Addiction Treatment   [facility] Organization         Address  Phone  Notes  [facility]  [phone]   Domenic Schwab, PhD [address] Arlis Porta [place], [place]   [phone] or [phone]   [place], [place] [phone]   [facility] [address], [place], [place] [phone] Insurance/Medicaid/sponsorship through [facility] and Families [address] [place]                                    [place], [place] [phone] [place] [address], [place] [phone]    Dr. Adele Schilder  [phone]   [facility] Dept. 1) [address] [place] 2) [place] 3)  [place] [address], [place] [phone]  [phone]   [place] [phone] or [phone] (After Hours)

## 2014-12-29 ENCOUNTER — Telehealth: Payer: Self-pay | Admitting: *Deleted

## 2014-12-29 NOTE — Telephone Encounter (Signed)
Patient called and advised he has had a pain in his back for about 2 weeks and has an orange card and would like to see the doctor and see if he needs to be sent to ortho. Gave him an appt to see the doctor 01/02/15.

## 2015-01-02 ENCOUNTER — Telehealth: Payer: Self-pay | Admitting: *Deleted

## 2015-01-02 ENCOUNTER — Ambulatory Visit (INDEPENDENT_AMBULATORY_CARE_PROVIDER_SITE_OTHER): Payer: Self-pay | Admitting: Internal Medicine

## 2015-01-02 ENCOUNTER — Encounter: Payer: Self-pay | Admitting: Internal Medicine

## 2015-01-02 VITALS — BP 111/79 | HR 59 | Temp 98.0°F | Ht 71.0 in | Wt 152.5 lb

## 2015-01-02 DIAGNOSIS — A530 Latent syphilis, unspecified as early or late: Secondary | ICD-10-CM

## 2015-01-02 DIAGNOSIS — A630 Anogenital (venereal) warts: Secondary | ICD-10-CM

## 2015-01-02 DIAGNOSIS — R112 Nausea with vomiting, unspecified: Secondary | ICD-10-CM

## 2015-01-02 DIAGNOSIS — Z23 Encounter for immunization: Secondary | ICD-10-CM

## 2015-01-02 DIAGNOSIS — B20 Human immunodeficiency virus [HIV] disease: Secondary | ICD-10-CM

## 2015-01-02 MED ORDER — ONDANSETRON HCL 4 MG PO TABS: 4.0000 mg | ORAL_TABLET | Freq: Three times a day (TID) | ORAL | Status: DC | PRN

## 2015-01-02 NOTE — Assessment & Plan Note (Signed)
I will see if we can arrange general surgery referral for his large perirectal warts.
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
## 2015-01-02 NOTE — Assessment & Plan Note (Signed)
His HIV infection remains under excellent control. He does not want to simplify his anti-retroviral regimen. I will continue Truvada and Kaletra and see him back after blood work in 6 months.

## 2015-01-02 NOTE — Progress Notes (Signed)
Phone call to [place], [phone], [phone].  Shared pt's demographic information.  Pt will need to speak with a Financial Counselor prior to scheduling appt to see the MD.  Financial Counselor will call the pt this afternoon at 4:30 PM.  Pt notified that he would receive a call this afternoon by the Financial Counselor.  RN will fax paperwork once the pt has been approved for the clinic.  RN will also send this referral to [facility] ([place]).

## 2015-01-02 NOTE — Telephone Encounter (Signed)
Informed pt to expect a call from [place] Counselor at 4:30 PM this afternoon regarding the referral to the Surgical Clinic.  Pt verbalized understanding.
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
## 2015-01-02 NOTE — Assessment & Plan Note (Signed)
His RPR is declining as expected after treatment for latent syphilis. His latest RPR is down to 1:2.

## 2015-02-01 ENCOUNTER — Telehealth: Payer: Self-pay | Admitting: *Deleted

## 2015-02-01 NOTE — Telephone Encounter (Signed)
Call from [facility] with [facility] surgery department, requesting notes on this referral. Notes faxed to [phone] and confirmation received. Myrtis Hopping

## 2015-02-02 ENCOUNTER — Ambulatory Visit: Payer: Self-pay | Admitting: Infectious Diseases

## 2015-02-25 ENCOUNTER — Encounter (HOSPITAL_COMMUNITY): Payer: Self-pay | Admitting: Emergency Medicine

## 2015-02-25 ENCOUNTER — Emergency Department (HOSPITAL_COMMUNITY): Payer: Self-pay

## 2015-02-25 ENCOUNTER — Inpatient Hospital Stay (HOSPITAL_COMMUNITY)
Admission: EM | Admit: 2015-02-25 | Discharge: 2015-02-27 | DRG: 193 | Disposition: A | Discharge status: Home / Self Care | Payer: Self-pay | Attending: Internal Medicine | Admitting: Internal Medicine

## 2015-02-25 DIAGNOSIS — Z6820 Body mass index (BMI) 20.0-20.9, adult: Secondary | ICD-10-CM

## 2015-02-25 DIAGNOSIS — K649 Unspecified hemorrhoids: Secondary | ICD-10-CM

## 2015-02-25 DIAGNOSIS — R1110 Vomiting, unspecified: Secondary | ICD-10-CM | POA: Insufficient documentation

## 2015-02-25 DIAGNOSIS — F17210 Nicotine dependence, cigarettes, uncomplicated: Secondary | ICD-10-CM | POA: Diagnosis present

## 2015-02-25 DIAGNOSIS — Z21 Asymptomatic human immunodeficiency virus [HIV] infection status: Secondary | ICD-10-CM

## 2015-02-25 DIAGNOSIS — Z803 Family history of malignant neoplasm of breast: Secondary | ICD-10-CM

## 2015-02-25 DIAGNOSIS — Z8249 Family history of ischemic heart disease and other diseases of the circulatory system: Secondary | ICD-10-CM

## 2015-02-25 DIAGNOSIS — J189 Pneumonia, unspecified organism: Principal | ICD-10-CM

## 2015-02-25 DIAGNOSIS — B20 Human immunodeficiency virus [HIV] disease: Secondary | ICD-10-CM

## 2015-02-25 DIAGNOSIS — E43 Unspecified severe protein-calorie malnutrition: Secondary | ICD-10-CM | POA: Insufficient documentation

## 2015-02-25 DIAGNOSIS — Z833 Family history of diabetes mellitus: Secondary | ICD-10-CM

## 2015-02-25 DIAGNOSIS — E785 Hyperlipidemia, unspecified: Secondary | ICD-10-CM | POA: Diagnosis present

## 2015-02-25 DIAGNOSIS — Z23 Encounter for immunization: Secondary | ICD-10-CM

## 2015-02-25 DIAGNOSIS — B078 Other viral warts: Secondary | ICD-10-CM | POA: Diagnosis present

## 2015-02-25 DIAGNOSIS — R112 Nausea with vomiting, unspecified: Secondary | ICD-10-CM

## 2015-02-25 DIAGNOSIS — Z720 Tobacco use: Secondary | ICD-10-CM

## 2015-02-25 DIAGNOSIS — A630 Anogenital (venereal) warts: Secondary | ICD-10-CM | POA: Diagnosis present

## 2015-02-25 DIAGNOSIS — K644 Residual hemorrhoidal skin tags: Secondary | ICD-10-CM | POA: Diagnosis present

## 2015-02-25 DIAGNOSIS — D72829 Elevated white blood cell count, unspecified: Secondary | ICD-10-CM | POA: Diagnosis present

## 2015-02-25 HISTORY — DX: Unspecified hemorrhoids: K64.9

## 2015-02-25 LAB — COMPREHENSIVE METABOLIC PANEL
ALT: 18 U/L (ref 17–63)
AST: 29 U/L (ref 15–41)
Albumin: 4 g/dL (ref 3.5–5.0)
Alkaline Phosphatase: 76 U/L (ref 38–126)
Anion gap: 8 (ref 5–15)
BILIRUBIN TOTAL: 1.9 mg/dL — AB (ref 0.3–1.2)
BUN: 7 mg/dL (ref 6–20)
CALCIUM: 8.7 mg/dL — AB (ref 8.9–10.3)
CHLORIDE: 106 mmol/L (ref 101–111)
CO2: 25 mmol/L (ref 22–32)
CREATININE: 0.84 mg/dL (ref 0.61–1.24)
Glucose, Bld: 95 mg/dL (ref 65–99)
Potassium: 3.5 mmol/L (ref 3.5–5.1)
Sodium: 139 mmol/L (ref 135–145)
TOTAL PROTEIN: 7.2 g/dL (ref 6.5–8.1)

## 2015-02-25 LAB — CBC WITH DIFFERENTIAL/PLATELET
Basophils Absolute: 0 10*3/uL (ref 0.0–0.1)
Basophils Relative: 0 %
Eosinophils Absolute: 0 10*3/uL (ref 0.0–0.7)
Eosinophils Relative: 0 %
HCT: 40.6 % (ref 39.0–52.0)
Hemoglobin: 14.1 g/dL (ref 13.0–17.0)
Lymphocytes Relative: 9 %
Lymphs Abs: 1.8 10*3/uL (ref 0.7–4.0)
MCH: 34.5 pg — ABNORMAL HIGH (ref 26.0–34.0)
MCHC: 34.7 g/dL (ref 30.0–36.0)
MCV: 99.3 fL (ref 78.0–100.0)
Monocytes Absolute: 1.1 10*3/uL — ABNORMAL HIGH (ref 0.1–1.0)
Monocytes Relative: 6 %
Neutro Abs: 16.8 10*3/uL — ABNORMAL HIGH (ref 1.7–7.7)
Neutrophils Relative %: 85 %
Platelets: 121 10*3/uL — ABNORMAL LOW (ref 150–400)
RBC: 4.09 MIL/uL — ABNORMAL LOW (ref 4.22–5.81)
RDW: 12.3 % (ref 11.5–15.5)
WBC: 19.7 10*3/uL — ABNORMAL HIGH (ref 4.0–10.5)

## 2015-02-25 LAB — URINALYSIS, ROUTINE W REFLEX MICROSCOPIC
Bilirubin Urine: NEGATIVE
GLUCOSE, UA: NEGATIVE mg/dL
Hgb urine dipstick: NEGATIVE
KETONES UR: NEGATIVE mg/dL
LEUKOCYTES UA: NEGATIVE
NITRITE: NEGATIVE
PROTEIN: NEGATIVE mg/dL
Specific Gravity, Urine: 1 — ABNORMAL LOW (ref 1.005–1.030)
UROBILINOGEN UA: 1 mg/dL (ref 0.0–1.0)
pH: 7.5 (ref 5.0–8.0)

## 2015-02-25 LAB — EXPECTORATED SPUTUM ASSESSMENT W GRAM STAIN, RFLX TO RESP C

## 2015-02-25 LAB — EXPECTORATED SPUTUM ASSESSMENT W REFEX TO RESP CULTURE

## 2015-02-25 LAB — STREP PNEUMONIAE URINARY ANTIGEN: Strep Pneumo Urinary Antigen: NEGATIVE

## 2015-02-25 LAB — MAGNESIUM: MAGNESIUM: 1.8 mg/dL (ref 1.7–2.4)

## 2015-02-25 MED ORDER — HYDROCORTISONE ACETATE 25 MG RE SUPP
25.0000 mg | Freq: Two times a day (BID) | RECTAL | Status: DC
  Administered 2015-02-25 – 2015-02-27 (×3): 25 mg via RECTAL
  Filled 2015-02-25 (×6): qty 1

## 2015-02-25 MED ORDER — ENOXAPARIN SODIUM 40 MG/0.4ML ~~LOC~~ SOLN
40.0000 mg | SUBCUTANEOUS | Status: DC
  Administered 2015-02-25 – 2015-02-27 (×3): 40 mg via SUBCUTANEOUS
  Filled 2015-02-25 (×3): qty 0.4

## 2015-02-25 MED ORDER — AZITHROMYCIN 500 MG IV SOLR
500.0000 mg | Freq: Once | INTRAVENOUS | Status: AC
  Administered 2015-02-25: 500 mg via INTRAVENOUS
  Filled 2015-02-25: qty 500

## 2015-02-25 MED ORDER — CALCIUM POLYCARBOPHIL 625 MG PO TABS
625.0000 mg | ORAL_TABLET | Freq: Every day | ORAL | Status: DC
  Administered 2015-02-25 – 2015-02-26 (×2): 625 mg via ORAL
  Administered 2015-02-27: 325 mg via ORAL
  Filled 2015-02-25 (×3): qty 1

## 2015-02-25 MED ORDER — SODIUM CHLORIDE 0.9 % IV BOLUS (SEPSIS)
1000.0000 mL | Freq: Once | INTRAVENOUS | Status: AC
  Administered 2015-02-25: 1000 mL via INTRAVENOUS

## 2015-02-25 MED ORDER — SENNOSIDES-DOCUSATE SODIUM 8.6-50 MG PO TABS
1.0000 | ORAL_TABLET | Freq: Two times a day (BID) | ORAL | Status: DC
  Administered 2015-02-25 – 2015-02-27 (×5): 1 via ORAL
  Filled 2015-02-25 (×5): qty 1

## 2015-02-25 MED ORDER — MORPHINE SULFATE (PF) 4 MG/ML IV SOLN
4.0000 mg | Freq: Once | INTRAVENOUS | Status: AC
  Administered 2015-02-25: 4 mg via INTRAVENOUS
  Filled 2015-02-25: qty 1

## 2015-02-25 MED ORDER — OXYCODONE HCL 5 MG PO TABS
5.0000 mg | ORAL_TABLET | Freq: Four times a day (QID) | ORAL | Status: DC | PRN
  Administered 2015-02-25 – 2015-02-26 (×4): 5 mg via ORAL
  Filled 2015-02-25 (×4): qty 1

## 2015-02-25 MED ORDER — LOPINAVIR-RITONAVIR 200-50 MG PO TABS
2.0000 | ORAL_TABLET | Freq: Two times a day (BID) | ORAL | Status: DC
  Administered 2015-02-25 – 2015-02-27 (×5): 2 via ORAL
  Filled 2015-02-25 (×6): qty 2

## 2015-02-25 MED ORDER — POTASSIUM CHLORIDE CRYS ER 20 MEQ PO TBCR
40.0000 meq | EXTENDED_RELEASE_TABLET | Freq: Once | ORAL | Status: AC
Start: 2015-02-25 — End: 2015-02-25
  Administered 2015-02-25: 40 meq via ORAL
  Filled 2015-02-25: qty 2

## 2015-02-25 MED ORDER — ENSURE ENLIVE PO LIQD
237.0000 mL | Freq: Two times a day (BID) | ORAL | Status: DC
  Administered 2015-02-25 – 2015-02-27 (×4): 237 mL via ORAL

## 2015-02-25 MED ORDER — NICOTINE 21 MG/24HR TD PT24
21.0000 mg | MEDICATED_PATCH | Freq: Every day | TRANSDERMAL | Status: DC
  Administered 2015-02-25 – 2015-02-27 (×3): 21 mg via TRANSDERMAL
  Filled 2015-02-25 (×3): qty 1

## 2015-02-25 MED ORDER — IOHEXOL 350 MG/ML SOLN
100.0000 mL | Freq: Once | INTRAVENOUS | Status: AC | PRN
Start: 2015-02-25 — End: 2015-02-25
  Administered 2015-02-25: 100 mL via INTRAVENOUS

## 2015-02-25 MED ORDER — SODIUM CHLORIDE 0.9 % IV SOLN
INTRAVENOUS | Status: DC
  Administered 2015-02-25: 125 mL/h via INTRAVENOUS
  Administered 2015-02-26 – 2015-02-27 (×3): via INTRAVENOUS

## 2015-02-25 MED ORDER — EMTRICITABINE-TENOFOVIR AF 200-25 MG PO TABS
1.0000 | ORAL_TABLET | Freq: Every day | ORAL | Status: DC
  Administered 2015-02-25 – 2015-02-27 (×3): 1 via ORAL
  Filled 2015-02-25 (×3): qty 1

## 2015-02-25 MED ORDER — CEFTRIAXONE SODIUM 1 G IJ SOLR
1.0000 g | Freq: Once | INTRAMUSCULAR | Status: AC
  Administered 2015-02-25: 1 g via INTRAVENOUS
  Filled 2015-02-25: qty 10

## 2015-02-25 MED ORDER — ACETAMINOPHEN 325 MG PO TABS: 650.0000 mg | ORAL_TABLET | ORAL | Status: DC | PRN

## 2015-02-25 MED ORDER — GUAIFENESIN ER 600 MG PO TB12
1200.0000 mg | ORAL_TABLET | Freq: Two times a day (BID) | ORAL | Status: DC
  Administered 2015-02-25 – 2015-02-27 (×5): 1200 mg via ORAL
  Filled 2015-02-25 (×5): qty 2

## 2015-02-25 MED ORDER — FOLIC ACID 5 MG/ML IJ SOLN
Freq: Once | INTRAMUSCULAR | Status: AC
Start: 2015-02-25 — End: 2015-02-25
  Administered 2015-02-25: 11:00:00 via INTRAVENOUS
  Filled 2015-02-25: qty 1000

## 2015-02-25 MED ORDER — OXYCODONE-ACETAMINOPHEN 5-325 MG PO TABS: 1.0000 | ORAL_TABLET | Freq: Once | ORAL | Status: DC

## 2015-02-25 MED ORDER — ACETAMINOPHEN-CODEINE #3 300-30 MG PO TABS: 1.0000 | ORAL_TABLET | Freq: Four times a day (QID) | ORAL | Status: DC | PRN

## 2015-02-25 MED ORDER — INFLUENZA VAC SPLIT QUAD 0.5 ML IM SUSY
0.5000 mL | PREFILLED_SYRINGE | INTRAMUSCULAR | Status: DC
  Filled 2015-02-25 (×3): qty 0.5

## 2015-02-25 MED ORDER — PNEUMOCOCCAL VAC POLYVALENT 25 MCG/0.5ML IJ INJ
0.5000 mL | INJECTION | INTRAMUSCULAR | Status: DC
  Filled 2015-02-25 (×3): qty 0.5

## 2015-02-25 MED ORDER — ONDANSETRON HCL 4 MG/2ML IJ SOLN: 4.0000 mg | Freq: Four times a day (QID) | INTRAMUSCULAR | Status: DC | PRN

## 2015-02-25 MED ORDER — DEXTROSE 5 % IV SOLN
500.0000 mg | INTRAVENOUS | Status: DC
  Administered 2015-02-26 – 2015-02-27 (×2): 500 mg via INTRAVENOUS
  Filled 2015-02-25 (×2): qty 500

## 2015-02-25 MED ORDER — IPRATROPIUM-ALBUTEROL 0.5-2.5 (3) MG/3ML IN SOLN: 3.0000 mL | RESPIRATORY_TRACT | Status: DC | PRN

## 2015-02-25 MED ORDER — IPRATROPIUM-ALBUTEROL 0.5-2.5 (3) MG/3ML IN SOLN
3.0000 mL | Freq: Four times a day (QID) | RESPIRATORY_TRACT | Status: DC
  Administered 2015-02-25 (×2): 3 mL via RESPIRATORY_TRACT
  Filled 2015-02-25 (×2): qty 3

## 2015-02-25 MED ORDER — DEXTROSE 5 % IV SOLN
1.0000 g | INTRAVENOUS | Status: DC
  Administered 2015-02-26 – 2015-02-27 (×2): 1 g via INTRAVENOUS
  Filled 2015-02-25 (×2): qty 10

## 2015-02-25 NOTE — ED Notes (Signed)
Patient presents from home with c/o of right side flank pain that worsens when he coughs. Recent cold symptoms. Denies any shortness of breath or chest pain.

## 2015-02-25 NOTE — ED Provider Notes (Signed)
CSN: [id number]     Arrival date & time 02/25/15  0207 History   First MD Initiated Contact with Patient 02/25/15 [phone]     Chief Complaint  Patient presents with  . Flank Pain     (Consider location/radiation/quality/duration/timing/severity/associated sxs/prior Treatment) HPI Comments: Patient with a history of HIV (Viral load <20 07/16), latent syphilis, presents with pain that started in his right shoulder, moved into right chest and upper abdomen. He has become progressively SOB with pain on respiration causing difficult breathing. No vomiting. No known fever. He denies injury to the shoulder or chest.   The history is provided by the patient. No language interpreter was used.    Past Medical History  Diagnosis Date  . HIV (human immunodeficiency virus infection) ([place])   . Pneumonia 2007  . History of tuberculin skin testing within last year 2014  . Anal warts    Past Surgical History  Procedure Laterality Date  . Hernia repair  1991    Abdominal   . Unilateral orchiectomy     Family History  Problem Relation Age of Onset  . Diabetes Mother   . Hypertension Mother   . Cancer Mother     breast  . Diabetes Father   . Hypertension Father    Social History  Substance Use Topics  . Smoking status: Current Every Day Smoker -- 1.00 packs/day for 24 years    Types: Cigarettes  . Smokeless tobacco: Never Used  . Alcohol Use: 0.0 oz/week    0 Standard drinks or equivalent per week     Comment: occasional    Review of Systems  Constitutional: Negative for fever and chills.  Respiratory: Positive for cough and shortness of breath.        See HPI.  Cardiovascular: Positive for chest pain.  Gastrointestinal: Negative.  Negative for nausea and vomiting.  Musculoskeletal: Negative.   Skin: Negative.   Neurological: Negative.       Allergies  Review of patient's allergies indicates no known allergies.  Home Medications   Prior to Admission medications   Medication  Sig Start Date End Date Taking? Authorizing Provider  emtricitabine-tenofovir (TRUVADA) 200-300 MG per tablet Take 1 tablet by mouth daily. 04/19/14  Yes Michel Bickers, MD  lopinavir-ritonavir (KALETRA) 200-50 MG per tablet Take 2 tablets by mouth 2 (two) times daily. 04/19/14  Yes Michel Bickers, MD  acetaminophen (TYLENOL) 500 MG tablet Take 1 tablet (500 mg total) by mouth every 6 (six) hours as needed. Patient not taking: Reported on 02/25/2015 12/18/14   Marella Chimes, PA-C  acetaminophen-codeine (TYLENOL #3) 300-30 MG per tablet Take 1-2 tablets by mouth every 6 (six) hours as needed for moderate pain. Patient not taking: Reported on 02/25/2015 11/21/14   Michel Bickers, MD  clotrimazole (LOTRIMIN AF) 1 % cream Apply 1 application topically 2 (two) times daily. To left foot between toes Patient not taking: Reported on 05/11/2014 01/11/14   Clayton Bibles, PA-C  methocarbamol (ROBAXIN) 500 MG tablet Take 1 tablet (500 mg total) by mouth 2 (two) times daily. Patient not taking: Reported on 02/25/2015 12/18/14   Marella Chimes, PA-C  ondansetron (ZOFRAN) 4 MG tablet Take 1 tablet (4 mg total) by mouth every 8 (eight) hours as needed for nausea or vomiting. Patient not taking: Reported on 02/25/2015 01/02/15   Michel Bickers, MD  predniSONE (DELTASONE) 20 MG tablet Take 2 tablets (40 mg total) by mouth daily. Patient not taking: Reported on 02/25/2015 12/15/14   Antonietta Breach,  PA-C   BP 141/65 mmHg  Pulse 89  Temp(Src) 100.2 F (37.9 C) (Oral)  Resp 21  SpO2 99% Physical Exam  Constitutional: He is oriented to person, place, and time. He appears well-developed and well-nourished. No distress.  HENT:  Head: Normocephalic.  Neck: Normal range of motion. Neck supple.  Cardiovascular: Normal rate and regular rhythm.   No murmur heard. Pulmonary/Chest: Effort normal and breath sounds normal. He has no wheezes. He has no rales.  Patient is splinting during respiration, appears uncomfortable.    Abdominal: Soft. Bowel sounds are normal. There is no tenderness. There is no rebound and no guarding.  Musculoskeletal: Normal range of motion. He exhibits no edema.  Neurological: He is alert and oriented to person, place, and time.  Skin: Skin is warm and dry. No rash noted.  Psychiatric: He has a normal mood and affect.    ED Course  Procedures (including critical care time) Labs Review Labs Reviewed  CBC WITH DIFFERENTIAL/PLATELET - Abnormal; Notable for the following:    WBC 19.7 (*)    RBC 4.09 (*)    MCH 34.5 (*)    Platelets 121 (*)    Neutro Abs 16.8 (*)    Monocytes Absolute 1.1 (*)    All other components within normal limits  COMPREHENSIVE METABOLIC PANEL - Abnormal; Notable for the following:    Calcium 8.7 (*)    Total Bilirubin 1.9 (*)    All other components within normal limits   Results for orders placed or performed during the hospital encounter of 02/25/15  CBC with Differential  Result Value Ref Range   WBC 19.7 (H) 4.0 - 10.5 K/uL   RBC 4.09 (L) 4.22 - 5.81 MIL/uL   Hemoglobin 14.1 13.0 - 17.0 g/dL   HCT 40.6 39.0 - 52.0 %   MCV 99.3 78.0 - 100.0 fL   MCH 34.5 (H) 26.0 - 34.0 pg   MCHC 34.7 30.0 - 36.0 g/dL   RDW 12.3 11.5 - 15.5 %   Platelets 121 (L) 150 - 400 K/uL   Neutrophils Relative % 85 %   Neutro Abs 16.8 (H) 1.7 - 7.7 K/uL   Lymphocytes Relative 9 %   Lymphs Abs 1.8 0.7 - 4.0 K/uL   Monocytes Relative 6 %   Monocytes Absolute 1.1 (H) 0.1 - 1.0 K/uL   Eosinophils Relative 0 %   Eosinophils Absolute 0.0 0.0 - 0.7 K/uL   Basophils Relative 0 %   Basophils Absolute 0.0 0.0 - 0.1 K/uL  Comprehensive metabolic panel  Result Value Ref Range   Sodium 139 135 - 145 mmol/L   Potassium 3.5 3.5 - 5.1 mmol/L   Chloride 106 101 - 111 mmol/L   CO2 25 22 - 32 mmol/L   Glucose, Bld 95 65 - 99 mg/dL   BUN 7 6 - 20 mg/dL   Creatinine, Ser 0.84 0.61 - 1.24 mg/dL   Calcium 8.7 (L) 8.9 - 10.3 mg/dL   Total Protein 7.2 6.5 - 8.1 g/dL   Albumin 4.0  3.5 - 5.0 g/dL   AST 29 15 - 41 U/L   ALT 18 17 - 63 U/L   Alkaline Phosphatase 76 38 - 126 U/L   Total Bilirubin 1.9 (H) 0.3 - 1.2 mg/dL   GFR calc non Af Amer >60 >60 mL/min   GFR calc Af Amer >60 >60 mL/min   Anion gap 8 5 - 15    Imaging Review Dg Chest 2 View  02/25/2015  CLINICAL  DATA:  Right flank pain EXAM: CHEST  2 VIEW COMPARISON:  09/14/2013 FINDINGS: There are basilar opacities, right greater than left, suspicious for infectious infiltrates. No pleural effusions. Pulmonary vasculature is normal. IMPRESSION: Right greater than left bibasilar opacities, likely pneumonia. Followup PA and lateral chest X-ray is recommended in 3-4 weeks following trial of antibiotic therapy to ensure resolution and exclude underlying malignancy. Electronically Signed   By: Andreas Newport M.D.   On: 02/25/2015 02:28   I have personally reviewed and evaluated these images and lab results as part of my medical decision-making.   EKG Interpretation None      MDM   Final diagnoses:  None    1. Pneumonia, community acquired  No hypoxia. The patient is more comfortable with medications and is able to rest comfortably. Evidence of PNA on CXR, confirmed on CT, no PE. IV antibiotics started, fluids provided. He is drinking PO fluids as well.   Discussed admission with [facility] who accepts the patient for admission.     Charlann Lange, PA-C 02/25/15 8099  April Palumbo, MD 02/25/15 [phone]

## 2015-02-25 NOTE — Progress Notes (Signed)
Respiratory assessment done and patient scored a 5. NO indication of Bronchodilator therapy needed at this time. Will reassess as needed for any changes. X ray showed areas of pneumonia and patient doesn't take any treatments at home. Patient is a smoker and will continue to monitor.

## 2015-02-25 NOTE — Progress Notes (Signed)
PHARMACY NOTE -  ANTIBIOTIC RENAL DOSE ADJUSTMENT   Request received for Pharmacy to assist with antibiotic renal dose adjustment.   Patient has been initiated on ceftriaxone and azithromycin for CAP.  Neither antibiotic requires renal dose adjustment. Current dosage is appropriate for both antibiotics.  Will sign off at this time.  Please reconsult if a change in clinical status warrants re-evaluation of dosage.  Doreene Eland, PharmD, BCPS.   Pager: [phone] 02/25/2015 8:49 AM

## 2015-02-25 NOTE — H&P (Signed)
[facility] History and Physical  Mansoor Hillyard QVZ:[id number] DOB: [birth year] DOA: 02/25/2015  Referring physician: Dr Randal Buba PCP: No primary care provider on file.  ID:Dr Megan Salon  Chief Complaint: right rib pain  HPI: Jeremiah [name] is a 44 y.o. male  With history of well-controlled HIV last viral load less than 20 in July 2016, latent syphilis which has been treated per ID note with last RPR down to 1:2, hemorrhoid and anal warts being followed at [facility] and patient scheduled for surgery on 03/02/2015, ongoing tobacco abuse, who presents to the ED with a one-day history of right-sided pleuritic chest pain that started the night prior to admission. Patient stated in the pain started in his right shoulder and moved to his right rib area and upper abdomen with some shortness of breath. Patient states he had upper respiratory symptoms over the past 3-4 days prior to admission. Patient endorses a productive cough of brownish sputum that started on the day of admission, chills, generalized weakness, subjective fevers.patient denies any chest pain, no headaches, no nausea, no vomiting, no diarrhea, no constipation, no dysuria. Patient denies any hematemesis. No melena. Patient endorses some occasional bright red blood noted on the toilet paper which he attributes to his hemorrhoids. Patient states he got the flu vaccine this year.   patient was in the emergency room chest x-ray and CT scan which were done were consistent with a right-sided pneumonia. CT chest was negative for PE. Comprehensive metabolic profile had a bilirubin of 1.9 otherwise was unremarkable. CBC had a white count of 19.7 with a left shift otherwise was unremarkable. Urinalysis was unremarkable. Patient was given IV Rocephin and IV azithromycin.[facility] were called to admit the patient for further evaluation and management.   Review of Systems:  As per history of present illness otherwise  negative. Constitutional:  No weight loss, night sweats, Fevers, chills, fatigue.  HEENT:  No headaches, Difficulty swallowing,Tooth/dental problems,Sore throat,  No sneezing, itching, ear ache, nasal congestion, post nasal drip,  Cardio-vascular:  No chest pain, Orthopnea, PND, swelling in lower extremities, anasarca, dizziness, palpitations  GI:  No heartburn, indigestion, abdominal pain, nausea, vomiting, diarrhea, change in bowel habits, loss of appetite  Resp:  No shortness of breath with exertion or at rest. No excess mucus, no productive cough, No non-productive cough, No coughing up of blood.No change in color of mucus.No wheezing.No chest wall deformity  Skin:  no rash or lesions.  GU:  no dysuria, change in color of urine, no urgency or frequency. No flank pain.  Musculoskeletal:  No joint pain or swelling. No decreased range of motion. No back pain.  Psych:  No change in mood or affect. No depression or anxiety. No memory loss.   Past Medical History  Diagnosis Date  . HIV (human immunodeficiency virus infection) ([place])   . Pneumonia 2007  . History of tuberculin skin testing within last year 2014  . Anal warts   . Hemorrhoids 02/25/2015   Past Surgical History  Procedure Laterality Date  . Hernia repair  1991    Abdominal   . Unilateral orchiectomy     Social History:  reports that he has been smoking Cigarettes.  He has a 24 pack-year smoking history. He has never used smokeless tobacco. He reports that he drinks alcohol. He reports that he does not use illicit drugs.  Allergies  Allergen Reactions  . Nsaids Other (See Comments)    DRUG INTERACTION WITH HIV MEDS  . Sulfa Antibiotics Itching  Family History  Problem Relation Age of Onset  . Diabetes Mother   . Hypertension Mother   . Cancer Mother     breast  . Diabetes Father   . Hypertension Father    Mother alive age 69 with a history of diabetes and breast cancer. Father alive age 22 with a history  of diabetes and hypertension.   Prior to Admission medications   Medication Sig Start Date End Date Taking? Authorizing Provider  emtricitabine-tenofovir (TRUVADA) 200-300 MG per tablet Take 1 tablet by mouth daily. 04/19/14  Yes Michel Bickers, MD  lopinavir-ritonavir (KALETRA) 200-50 MG per tablet Take 2 tablets by mouth 2 (two) times daily. 04/19/14  Yes Michel Bickers, MD  acetaminophen (TYLENOL) 500 MG tablet Take 1 tablet (500 mg total) by mouth every 6 (six) hours as needed. Patient not taking: Reported on 02/25/2015 12/18/14   Marella Chimes, PA-C  acetaminophen-codeine (TYLENOL #3) 300-30 MG per tablet Take 1-2 tablets by mouth every 6 (six) hours as needed for moderate pain. Patient not taking: Reported on 02/25/2015 11/21/14   Michel Bickers, MD  clotrimazole (LOTRIMIN AF) 1 % cream Apply 1 application topically 2 (two) times daily. To left foot between toes Patient not taking: Reported on 05/11/2014 01/11/14   Clayton Bibles, PA-C  methocarbamol (ROBAXIN) 500 MG tablet Take 1 tablet (500 mg total) by mouth 2 (two) times daily. Patient not taking: Reported on 02/25/2015 12/18/14   Marella Chimes, PA-C  ondansetron (ZOFRAN) 4 MG tablet Take 1 tablet (4 mg total) by mouth every 8 (eight) hours as needed for nausea or vomiting. Patient not taking: Reported on 02/25/2015 01/02/15   Michel Bickers, MD  predniSONE (DELTASONE) 20 MG tablet Take 2 tablets (40 mg total) by mouth daily. Patient not taking: Reported on 02/25/2015 12/15/14   Antonietta Breach, PA-C   Physical Exam: Filed Vitals:   02/25/15 0730 02/25/15 0741 02/25/15 0752 02/25/15 0814  BP: 113/71  114/73 123/80  Pulse: 78  73 64  Temp: 100.8 F (38.2 C)   100.6 F (38.1 C)  TempSrc: Oral   Oral  Resp: 19  16 20   Height:  5\' 11"  (1.803 m)  5\' 11"  (1.803 m)  Weight:  70.761 kg (156 lb)  65.5 kg (144 lb 6.4 oz)  SpO2: 94%  100% 100%    Wt Readings from Last 3 Encounters:  02/25/15 65.5 kg (144 lb 6.4 oz)  01/02/15 69.174 kg (152 lb  8 oz)  11/21/14 69.627 kg (153 lb 8 oz)    General:  Well-developed well-nourished laying on gurney in no acute cardiopulmonary distress. Speaking in full sentences. Eyes: PERRLA, EOMI, muddy sclera, normal lids, irises & conjunctiva ENT: grossly normal hearing, lips & tongue, dry mucous membranes.  Neck: no LAD, masses or thyromegaly Cardiovascular: RRR, no m/r/g. No LE edema. Respiratory: CTA bilaterally,  some shallow breath sounds. No crackles. No use of accessory muscles of respiration.  Abdomen: soft, ntnd, positive bowel sounds, no rebound, no guarding  Skin: no rash or induration seen on limited exam Musculoskeletal: grossly normal tone BUE/BLE Psychiatric: grossly normal mood and affect, speech fluent and appropriate Neurologic:  alert and oriented 3. Cranial nerves II through XII are grossly intact. No focal deficits.           Labs on Admission:  Basic Metabolic Panel:  Recent Labs Lab 02/25/15 0329  NA 139  K 3.5  CL 106  CO2 25  GLUCOSE 95  BUN 7  CREATININE 0.84  CALCIUM 8.7*   Liver Function Tests:  Recent Labs Lab 02/25/15 0329  AST 29  ALT 18  ALKPHOS 76  BILITOT 1.9*  PROT 7.2  ALBUMIN 4.0   No results for input(s): LIPASE, AMYLASE in the last 168 hours. No results for input(s): AMMONIA in the last 168 hours. CBC:  Recent Labs Lab 02/25/15 0329  WBC 19.7*  NEUTROABS 16.8*  HGB 14.1  HCT 40.6  MCV 99.3  PLT 121*   Cardiac Enzymes: No results for input(s): CKTOTAL, CKMB, CKMBINDEX, TROPONINI in the last 168 hours.  BNP (last 3 results) No results for input(s): BNP in the last 8760 hours.  ProBNP (last 3 results) No results for input(s): PROBNP in the last 8760 hours.  CBG: No results for input(s): GLUCAP in the last 168 hours.  Radiological Exams on Admission: Dg Chest 2 View  02/25/2015  CLINICAL DATA:  Right flank pain EXAM: CHEST  2 VIEW COMPARISON:  09/14/2013 FINDINGS: There are basilar opacities, right greater than left,  suspicious for infectious infiltrates. No pleural effusions. Pulmonary vasculature is normal. IMPRESSION: Right greater than left bibasilar opacities, likely pneumonia. Followup PA and lateral chest X-ray is recommended in 3-4 weeks following trial of antibiotic therapy to ensure resolution and exclude underlying malignancy. Electronically Signed   By: Andreas Newport M.D.   On: 02/25/2015 02:28   Ct Angio Chest Pe W/cm &/or Wo Cm  02/25/2015  CLINICAL DATA:  Right flank pain which worsens when he coughs. Leukocytosis EXAM: CT ANGIOGRAPHY CHEST WITH CONTRAST TECHNIQUE: Multidetector CT imaging of the chest was performed using the standard protocol during bolus administration of intravenous contrast. Multiplanar CT image reconstructions and MIPs were obtained to evaluate the vascular anatomy. CONTRAST:  171mL OMNIPAQUE IOHEXOL 350 MG/ML SOLN COMPARISON:  Radiographs 02/25/2015 FINDINGS: Cardiovascular: There is good opacification of the pulmonary arteries. There is no pulmonary embolism. The thoracic aorta is normal in caliber and intact. Lungs: There is dense confluent consolidation in the right middle lobe with patchy areas of airspace opacity in the posterior lower lobes. This may represent pneumonia. Pulmonary hemorrhage could also have this appearance. Prominent paraseptal emphysematous changes are present, upper lobe predominant. Central airways: Patent Effusions: None Lymphadenopathy: Small right hilar nodes, possibly reactive. Esophagus: Unremarkable Upper abdomen: Unremarkable Musculoskeletal: No significant abnormality Review of the MIP images confirms the above findings. IMPRESSION: 1. Negative for acute pulmonary embolism. 2. Dense consolidation in the right middle lobe. Patchy opacities in the lower lobes. Suspicious for pneumonia. 3. Prominent paraseptal emphysematous changes. Electronically Signed   By: Andreas Newport M.D.   On: 02/25/2015 05:03    EKG: none   Assessment/Plan Principal  Problem:   CAP (community acquired pneumonia) Active Problems:   Anal warts   Dyslipidemia   Cigarette smoker   HIV (human immunodeficiency virus infection) ([place])   Hemorrhoids   Leukocytosis  #1 right-sided community-acquired pneumonia   patient presented with right pleuritic chest pain with some associated shortness of breath, chills, fever with a temp of 100.6. Chest x-ray and CT scan consistent with right middle lobe pneumonia and patchy opacities in the lower lobes. Patient also with a leukocytosis with a white count of 19.7. Will admit to telemetry. Check blood cultures 2. Check a sputum Gram stain and culture. Check a urine Legionella antigen. Check a urine pneumococcus antigen. Placed on oxygen, IV Rocephin, IV azithromycin, scheduled nebulizers, Mucinex, IV fluids, supportive care.  #2 well-controlled HIV Patient's HIV is under excellent control with her last viral load less  than 20 in July 2016. Will check a CD4 count. Check a viral load. Continue home regimen of Truvada and Kaletra. Outpatient follow-up.  #3 leukocytosis Likely secondary to problem #1. Urinalysis is negative. Check blood cultures 2. Continue empiric IV Rocephin and IV azithromycin.  #4 hemorrhoids/anal warts Patient has been assessed by general surgery at the [facility] and patient states is scheduled for surgery on Thursday, 03/02/2015. Continue sitz baths 3 times a day, Senokot S twice a day, FiberCon, Anusol suppositories twice a day.  #5 tobacco abuse Tobacco cessation. Placed on a nicotine patch.  #6 prophylaxis Lovenox for DVT prophylaxis.    Code Status: Full  DVT Prophylaxis: Lovenox  Family Communication: updated patient. No family at bedside. Disposition Plan: Home when medically stable.  Time spent: 55 minutes   Mandeep Ferch M.D.  [facility] Pager [phone]

## 2015-02-26 ENCOUNTER — Telehealth: Payer: Self-pay | Admitting: *Deleted

## 2015-02-26 DIAGNOSIS — E43 Unspecified severe protein-calorie malnutrition: Secondary | ICD-10-CM | POA: Insufficient documentation

## 2015-02-26 LAB — CBC WITH DIFFERENTIAL/PLATELET
BASOS PCT: 0 %
Basophils Absolute: 0 10*3/uL (ref 0.0–0.1)
EOS ABS: 0.1 10*3/uL (ref 0.0–0.7)
EOS PCT: 1 %
HEMATOCRIT: 36.8 % — AB (ref 39.0–52.0)
Hemoglobin: 12.5 g/dL — ABNORMAL LOW (ref 13.0–17.0)
Lymphocytes Relative: 25 %
Lymphs Abs: 3 10*3/uL (ref 0.7–4.0)
MCH: 34.5 pg — ABNORMAL HIGH (ref 26.0–34.0)
MCHC: 34 g/dL (ref 30.0–36.0)
MCV: 101.7 fL — ABNORMAL HIGH (ref 78.0–100.0)
MONO ABS: 1.1 10*3/uL — AB (ref 0.1–1.0)
MONOS PCT: 9 %
NEUTROS ABS: 7.8 10*3/uL — AB (ref 1.7–7.7)
Neutrophils Relative %: 65 %
Platelets: 106 10*3/uL — ABNORMAL LOW (ref 150–400)
RBC: 3.62 MIL/uL — ABNORMAL LOW (ref 4.22–5.81)
RDW: 12.6 % (ref 11.5–15.5)
WBC: 12 10*3/uL — ABNORMAL HIGH (ref 4.0–10.5)

## 2015-02-26 LAB — BASIC METABOLIC PANEL
Anion gap: 3 — ABNORMAL LOW (ref 5–15)
BUN: 8 mg/dL (ref 6–20)
CALCIUM: 8.4 mg/dL — AB (ref 8.9–10.3)
CO2: 28 mmol/L (ref 22–32)
CREATININE: 0.87 mg/dL (ref 0.61–1.24)
Chloride: 108 mmol/L (ref 101–111)
GFR calc Af Amer: >60 mL/min (ref >60)
GFR calc non Af Amer: >60 mL/min (ref >60)
GLUCOSE: 99 mg/dL (ref 65–99)
Potassium: 4.1 mmol/L (ref 3.5–5.1)
Sodium: 139 mmol/L (ref 135–145)

## 2015-02-26 LAB — URINE CULTURE

## 2015-02-26 LAB — HIV-1 RNA QUANT-NO REFLEX-BLD
HIV 1 RNA Quant: <20 copies/mL
LOG10 HIV-1 RNA: UNDETERMINED {Log_copies}/mL

## 2015-02-26 LAB — T-HELPER CELLS (CD4) COUNT (NOT AT ARMC)
CD4 % Helper T Cell: 21 % — ABNORMAL LOW (ref 33–55)
CD4 T Cell Abs: 600 /uL (ref 400–2700)

## 2015-02-26 LAB — LEGIONELLA PNEUMOPHILA SEROGP 1 UR AG: L. PNEUMOPHILA SEROGP 1 UR AG: NEGATIVE

## 2015-02-26 NOTE — Evaluation (Signed)
Physical Therapy One Time Evaluation Patient Details Name: Jeremiah [name] MRN: [id number] DOB: 1970-07-26 Today's Date: 02/26/2015   History of Present Illness  Pt is a 44 year old male admitted with R rib pain and SOB; chest x-ray and CT scan were consistent with a right-sided pneumonia. CT chest was negative for PE.  PMHx HIV, hemorrhoid and anal warts being followed at [facility] and scheduled for surgery on 03/02/2015, ongoing tobacco abuse  Clinical Impression  Patient evaluated by Physical Therapy with no further acute PT needs identified. All education has been completed and the patient has no further questions. Pt tolerated ambulating in hallway well and SpO2 100% room air during gait. See below for any follow-up Physical Therapy or equipment needs. PT is signing off. Thank you for this referral.     Follow Up Recommendations No PT follow up    Equipment Recommendations  None recommended by PT    Recommendations for Other Services       Precautions / Restrictions Precautions Precautions: None      Mobility  Bed Mobility Overal bed mobility: Modified Independent                Transfers Overall transfer level: Modified independent                  Ambulation/Gait Ambulation/Gait assistance: Supervision;Modified independent (Device/Increase time) Ambulation Distance (Feet): 350 Feet Assistive device: None Gait Pattern/deviations: WFL(Within Functional Limits)     General Gait Details: no LOB or unsteady gait, reports very mild SOB, SpO2 100% on room air  Stairs            Wheelchair Mobility    Modified Rankin (Stroke Patients Only)       Balance Overall balance assessment: No apparent balance deficits (not formally assessed)                                           Pertinent Vitals/Pain Pain Assessment: 0-10 Pain Score: 4  Pain Location: chest with coughing or deep breathing Pain Descriptors / Indicators:  Sore Pain Intervention(s): Monitored during session    Home Living Family/patient expects to be discharged to:: Private residence Living Arrangements: Alone     Home Access: Stairs to enter   Technical brewer of Steps: flight   Home Equipment: None      Prior Function Level of Independence: Independent               Hand Dominance        Extremity/Trunk Assessment   Upper Extremity Assessment: Overall WFL for tasks assessed           Lower Extremity Assessment: Overall WFL for tasks assessed      Cervical / Trunk Assessment: Normal  Communication   Communication: No difficulties  Cognition Arousal/Alertness: Awake/alert Behavior During Therapy: WFL for tasks assessed/performed Overall Cognitive Status: Within Functional Limits for tasks assessed                      General Comments      Exercises        Assessment/Plan    PT Assessment Patent does not need any further PT services  PT Diagnosis Difficulty walking   PT Problem List    PT Treatment Interventions     PT Goals (Current goals can be found in the Care Plan section) Acute Rehab  PT Goals PT Goal Formulation: All assessment and education complete, DC therapy    Frequency     Barriers to discharge        Co-evaluation               End of Session   Activity Tolerance: Patient tolerated treatment well Patient left: in bed;with call bell/phone within reach Nurse Communication: Mobility status         Time: 5852-7782 PT Time Calculation (min) (ACUTE ONLY): 10 min   Charges:   PT Evaluation $Initial PT Evaluation Tier I: 1 Procedure     PT G Codes:        Jacee Enerson,KATHrine E 02/26/2015, 11:46 AM Carmelia Bake, PT, DPT 02/26/2015 Pager: [phone]

## 2015-02-26 NOTE — Progress Notes (Signed)
[facility] PROGRESS NOTE  Jeremiah [name] LKG:[id number] DOB: [birth year] DOA: 02/25/2015 PCP: No primary care provider on file.  Assessment/Plan: #1 Community acquired pneumonia Patient with clinical improvement. Fever curve trending down. Leukocytosis trending down. Sputum Gram stain and culture pending. Urine Legionella antigen is negative. Urine pneumococcus antigen is negative. Continue empiric IV Rocephin and IV azithromycin. Continue oxygen. Mucinex. Continue nebs. IV fluids. Supportive care.  #2 well-controlled HIV Patient's last viral load was less than 08 November 2014. CD4 count pending. Viral load pending. Continue Truvada and Kaletra. Outpatient follow-up.  #3 leukocytosis Likely secondary to problem #1. WBC trending down. Urinalysis is negative. Blood cultures pending. Continue current empiric IV antibiotics.  #4 hemorrhoids/anal warts Patient was supposed to have surgery this Thursday, 03/02/2015 at [facility]. Patient had been advised that it might be probably wise to delay surgery was again sober his pneumonia. Continue sitz baths, Senokot-S, FiberCon, Anusol suppositories.  #5 tobacco abuse Tobacco cessation. Nicotine patch.  #6 prophylaxis Lovenox for DVT prophylaxis.  Code Status: Full Family Communication: Updated patient. No family at bedside. Disposition Plan: Home hopefully tomorrow.   Consultants:  None  Procedures:  Chest x-ray 02/25/2015  CT chest 02/25/2015  Antibiotics:  IV Rocephin 02/25/2015  IV azithromycin 02/25/2015  HPI/Subjective: Patient states right-sided pleuritic pain improving. Shortness of breath improving. No nausea. No emesis. Tolerating current diet.  Objective: Filed Vitals:   02/26/15 0546  BP: 116/83  Pulse: 70  Temp: 98.5 F (36.9 C)  Resp: 18    Intake/Output Summary (Last 24 hours) at 02/26/15 1028 Last data filed at 02/26/15 0547  Gross per 24 hour  Intake   2955 ml  Output   3850 ml  Net   -895 ml    Filed Weights   02/25/15 0741 02/25/15 0814  Weight: 70.761 kg (156 lb) 65.5 kg (144 lb 6.4 oz)    Exam:   General:  NAD  Cardiovascular: RRR  Respiratory: CTAB  Abdomen: Soft, nontender, nondistended, positive bowel sounds.  Musculoskeletal: No clubbing cyanosis or edema.  Data Reviewed: Basic Metabolic Panel:  Recent Labs Lab 02/25/15 0329 02/25/15 0903 02/26/15 0438  NA 139  --  139  K 3.5  --  4.1  CL 106  --  108  CO2 25  --  28  GLUCOSE 95  --  99  BUN 7  --  8  CREATININE 0.84  --  0.87  CALCIUM 8.7*  --  8.4*  MG  --  1.8  --    Liver Function Tests:  Recent Labs Lab 02/25/15 0329  AST 29  ALT 18  ALKPHOS 76  BILITOT 1.9*  PROT 7.2  ALBUMIN 4.0   No results for input(s): LIPASE, AMYLASE in the last 168 hours. No results for input(s): AMMONIA in the last 168 hours. CBC:  Recent Labs Lab 02/25/15 0329 02/26/15 0438  WBC 19.7* 12.0*  NEUTROABS 16.8* 7.8*  HGB 14.1 12.5*  HCT 40.6 36.8*  MCV 99.3 101.7*  PLT 121* 106*   Cardiac Enzymes: No results for input(s): CKTOTAL, CKMB, CKMBINDEX, TROPONINI in the last 168 hours. BNP (last 3 results) No results for input(s): BNP in the last 8760 hours.  ProBNP (last 3 results) No results for input(s): PROBNP in the last 8760 hours.  CBG: No results for input(s): GLUCAP in the last 168 hours.  Recent Results (from the past 240 hour(s))  Culture, blood (routine x 2)     Status: None (Preliminary result)   Collection Time: 02/25/15  6:23 AM  Result Value Ref Range Status   Specimen Description   Final    BLOOD BLOOD RIGHT FOREARM Performed at [facility]    Special Requests BOTTLES DRAWN AEROBIC AND ANAEROBIC 5CC  Final   Culture PENDING  Incomplete   Report Status PENDING  Incomplete  Culture, blood (routine x 2)     Status: None (Preliminary result)   Collection Time: 02/25/15  6:30 AM  Result Value Ref Range Status   Specimen Description   Final    BLOOD RIGHT  ANTECUBITAL Performed at [facility]    Special Requests BOTTLES DRAWN AEROBIC AND ANAEROBIC 5CC  Final   Culture PENDING  Incomplete   Report Status PENDING  Incomplete  Culture, Urine     Status: None   Collection Time: 02/25/15  7:00 AM  Result Value Ref Range Status   Specimen Description URINE, CLEAN CATCH  Final   Special Requests NONE  Final   Culture   Final    20,000 COLONIES/mL GROUP B STREP(S.AGALACTIAE)ISOLATED TESTING AGAINST S. AGALACTIAE NOT ROUTINELY PERFORMED DUE TO PREDICTABILITY OF AMP/PEN/VAN SUSCEPTIBILITY. Performed at [facility]    Report Status 02/26/2015 FINAL  Final  Culture, sputum-assessment     Status: None   Collection Time: 02/25/15  9:05 AM  Result Value Ref Range Status   Specimen Description SPUTUM  Final   Special Requests NONE  Final   Sputum evaluation   Final    THIS SPECIMEN IS ACCEPTABLE. RESPIRATORY CULTURE REPORT TO FOLLOW.   Report Status 02/25/2015 FINAL  Final  Culture, respiratory (NON-Expectorated)     Status: None (Preliminary result)   Collection Time: 02/25/15  9:05 AM  Result Value Ref Range Status   Specimen Description SPUTUM  Final   Special Requests NONE  Final   Gram Stain   Final    FEW WBC PRESENT, PREDOMINANTLY PMN NO SQUAMOUS EPITHELIAL CELLS SEEN FEW GRAM POSITIVE COCCI IN PAIRS Performed at [facility]    Culture PENDING  Incomplete   Report Status PENDING  Incomplete     Studies: Dg Chest 2 View  02/25/2015  CLINICAL DATA:  Right flank pain EXAM: CHEST  2 VIEW COMPARISON:  09/14/2013 FINDINGS: There are basilar opacities, right greater than left, suspicious for infectious infiltrates. No pleural effusions. Pulmonary vasculature is normal. IMPRESSION: Right greater than left bibasilar opacities, likely pneumonia. Followup PA and lateral chest X-ray is recommended in 3-4 weeks following trial of antibiotic therapy to ensure resolution and exclude underlying malignancy. Electronically  Signed   By: Andreas Newport M.D.   On: 02/25/2015 02:28   Ct Angio Chest Pe W/cm &/or Wo Cm  02/25/2015  CLINICAL DATA:  Right flank pain which worsens when he coughs. Leukocytosis EXAM: CT ANGIOGRAPHY CHEST WITH CONTRAST TECHNIQUE: Multidetector CT imaging of the chest was performed using the standard protocol during bolus administration of intravenous contrast. Multiplanar CT image reconstructions and MIPs were obtained to evaluate the vascular anatomy. CONTRAST:  142mL OMNIPAQUE IOHEXOL 350 MG/ML SOLN COMPARISON:  Radiographs 02/25/2015 FINDINGS: Cardiovascular: There is good opacification of the pulmonary arteries. There is no pulmonary embolism. The thoracic aorta is normal in caliber and intact. Lungs: There is dense confluent consolidation in the right middle lobe with patchy areas of airspace opacity in the posterior lower lobes. This may represent pneumonia. Pulmonary hemorrhage could also have this appearance. Prominent paraseptal emphysematous changes are present, upper lobe predominant. Central airways: Patent Effusions: None Lymphadenopathy: Small right hilar nodes, possibly  reactive. Esophagus: Unremarkable Upper abdomen: Unremarkable Musculoskeletal: No significant abnormality Review of the MIP images confirms the above findings. IMPRESSION: 1. Negative for acute pulmonary embolism. 2. Dense consolidation in the right middle lobe. Patchy opacities in the lower lobes. Suspicious for pneumonia. 3. Prominent paraseptal emphysematous changes. Electronically Signed   By: Andreas Newport M.D.   On: 02/25/2015 05:03    Scheduled Meds: . azithromycin  500 mg Intravenous Q24H  . cefTRIAXone (ROCEPHIN)  IV  1 g Intravenous Q24H  . emtricitabine-tenofovir AF  1 tablet Oral Daily  . enoxaparin (LOVENOX) injection  40 mg Subcutaneous Q24H  . feeding supplement (ENSURE ENLIVE)  237 mL Oral BID BM  . guaiFENesin  1,200 mg Oral BID  . hydrocortisone  25 mg Rectal BID  . Influenza vac split  quadrivalent PF  0.5 mL Intramuscular Tomorrow-1000  . lopinavir-ritonavir  2 tablet Oral BID  . nicotine  21 mg Transdermal Daily  . pneumococcal 23 valent vaccine  0.5 mL Intramuscular Tomorrow-1000  . polycarbophil  625 mg Oral Daily  . senna-docusate  1 tablet Oral BID   Continuous Infusions: . sodium chloride 125 mL/hr at 02/26/15 0940    Principal Problem:   CAP (community acquired pneumonia) Active Problems:   Anal warts   Dyslipidemia   Cigarette smoker   HIV (human immunodeficiency virus infection) ([place])   Hemorrhoids   Leukocytosis    Time spent: 106 minutes    THOMPSON,DANIEL M.D. [facility] Pager [phone]. If 7PM-7AM, please contact night-coverage at [email or web address], password [facility] 02/26/2015, 10:28 AM  LOS: 1 day

## 2015-02-26 NOTE — Progress Notes (Signed)
OT Cancellation Note  Patient Details Name: Jeremiah [name] MRN: [id number] DOB: 09/02/1970   Cancelled Treatment:     No OT needs at this time. Will sign off.  Betsy Pries 02/26/2015, 12:56 PM

## 2015-02-26 NOTE — Telephone Encounter (Signed)
Patient called and wanted to let Dr Megan Salon know that he is in the hospital. He also wanted me to call [facility] to let them know he will have to cancel his surgery scheduled for Thursday 03/01/15 and he will call and reschedule once he is released. Gave him the number to [facility] and advised him will let Dr Megan Salon know he is in the hospital.

## 2015-02-26 NOTE — Progress Notes (Signed)
Initial Nutrition Assessment  DOCUMENTATION CODES:   Severe malnutrition in context of acute illness/injury  INTERVENTION:  - Continue Ensure Enlive BID, each supplement provides 350 kcal and 20 grams of protein - RD will continue to monitor for needs   NUTRITION DIAGNOSIS:   Increased nutrient needs related to catabolic illness as evidenced by estimated needs.  GOAL:   Patient will meet greater than or equal to 90% of their needs  MONITOR:   PO intake, Supplement acceptance, Weight trends, Labs, I & O's  REASON FOR ASSESSMENT:   Malnutrition Screening Tool  ASSESSMENT:   44 year old male with history of well-controlled HIV last viral load less than 20 in July 2016, latent syphilis which has been treated per ID note with last RPR down to 1:2, hemorrhoid and anal warts being followed at [facility] and patient scheduled for surgery on 03/02/2015, ongoing tobacco abuse, who presents to the ED with a one-day history of right-sided pleuritic chest pain that started the night prior to admission. Patient stated in the pain started in his right shoulder and moved to his right rib area and upper abdomen with some shortness of breath. Patient states he had upper respiratory symptoms over the past 3-4 days prior to admission.   Pt seen for MST. BMI indicates normal weight. Pt ate 50% of breakfast yesterday with no other intakes documented. Visualized lunch tray with 75% completion. Pt reports good appetite now and PTA with no associated nausea, abdominal pain, or pain with swallowing. He states PTA he would typically eat 2-3 meals/day which usually consisted of, as he states, "unhealthy foods" such as pizza and hamburgers.   PTA he was not drinking nutrition supplements but states he has drank 3 full bottles of Ensure since admission and that he has liked this supplement. Encouraged intake of supplement and continued good intakes with meals; due to catabolic illness although noted that notes state  HIV is well-controlled.   Pt reports that he had a pre-op visit in [place] last week and weighed 154 lbs at that time. Per chart review, pt has lost 10 lbs (6% body weight) in the past 1 week which is significant for time frame. Moderate muscle wasting to upper body also noted.   Unsure if pt is fully meeting needs at this time. Medications reviewed. Labs reviewed; Ca: 8.4 mg/dL.   Diet Order:  Diet regular Room service appropriate?: Yes; Fluid consistency:: Thin  Skin:  Reviewed, no issues  Last BM:  PTA  Height:   Ht Readings from Last 1 Encounters:  02/25/15 5\' 11"  (1.803 m)    Weight:   Wt Readings from Last 1 Encounters:  02/25/15 144 lb 6.4 oz (65.5 kg)    Ideal Body Weight:  78.18 kg (kg)  BMI:  Body mass index is 20.15 kg/(m^2).  Estimated Nutritional Needs:   Kcal:  2000-2200  Protein:  75-90 grams  Fluid:  2.2 L/day  EDUCATION NEEDS:   No education needs identified at this time     Jarome Matin, RD, LDN Inpatient Clinical Dietitian Pager # [phone] After hours/weekend pager # [phone]

## 2015-02-27 ENCOUNTER — Telehealth: Payer: Self-pay | Admitting: *Deleted

## 2015-02-27 DIAGNOSIS — R112 Nausea with vomiting, unspecified: Secondary | ICD-10-CM

## 2015-02-27 DIAGNOSIS — R1110 Vomiting, unspecified: Secondary | ICD-10-CM | POA: Insufficient documentation

## 2015-02-27 DIAGNOSIS — E785 Hyperlipidemia, unspecified: Secondary | ICD-10-CM

## 2015-02-27 LAB — CBC
HEMATOCRIT: 37.9 % — AB (ref 39.0–52.0)
HEMOGLOBIN: 12.9 g/dL — AB (ref 13.0–17.0)
MCH: 34.1 pg — AB (ref 26.0–34.0)
MCHC: 34 g/dL (ref 30.0–36.0)
MCV: 100.3 fL — ABNORMAL HIGH (ref 78.0–100.0)
Platelets: 120 10*3/uL — ABNORMAL LOW (ref 150–400)
RBC: 3.78 MIL/uL — ABNORMAL LOW (ref 4.22–5.81)
RDW: 12.3 % (ref 11.5–15.5)
WBC: 9.2 10*3/uL (ref 4.0–10.5)

## 2015-02-27 LAB — BASIC METABOLIC PANEL
Anion gap: 7 (ref 5–15)
BUN: 7 mg/dL (ref 6–20)
CHLORIDE: 106 mmol/L (ref 101–111)
CO2: 27 mmol/L (ref 22–32)
CREATININE: 1.05 mg/dL (ref 0.61–1.24)
Calcium: 8.7 mg/dL — ABNORMAL LOW (ref 8.9–10.3)
GFR calc Af Amer: >60 mL/min (ref >60)
GFR calc non Af Amer: >60 mL/min (ref >60)
Glucose, Bld: 96 mg/dL (ref 65–99)
Potassium: 3.8 mmol/L (ref 3.5–5.1)
Sodium: 140 mmol/L (ref 135–145)

## 2015-02-27 LAB — CULTURE, RESPIRATORY

## 2015-02-27 MED ORDER — OXYCODONE HCL 5 MG PO TABS: 5.0000 mg | ORAL_TABLET | Freq: Four times a day (QID) | ORAL | Status: DC | PRN

## 2015-02-27 MED ORDER — GUAIFENESIN ER 600 MG PO TB12: 1200.0000 mg | ORAL_TABLET | Freq: Two times a day (BID) | ORAL | Status: DC

## 2015-02-27 MED ORDER — AMOXICILLIN-POT CLAVULANATE 875-125 MG PO TABS: 1.0000 | ORAL_TABLET | Freq: Two times a day (BID) | ORAL | Status: DC

## 2015-02-27 MED ORDER — CALCIUM POLYCARBOPHIL 625 MG PO TABS
625.0000 mg | ORAL_TABLET | Freq: Every day | ORAL | Status: DC
Start: 2015-02-27 — End: 2018-01-06

## 2015-02-27 MED ORDER — AMOXICILLIN-POT CLAVULANATE 875-125 MG PO TABS
1.0000 | ORAL_TABLET | Freq: Two times a day (BID) | ORAL | Status: DC
Start: 2015-02-27 — End: 2015-02-27
  Administered 2015-02-27: 1 via ORAL
  Filled 2015-02-27: qty 1

## 2015-02-27 MED ORDER — SENNOSIDES-DOCUSATE SODIUM 8.6-50 MG PO TABS: 1.0000 | ORAL_TABLET | Freq: Two times a day (BID) | ORAL | Status: DC

## 2015-02-27 MED ORDER — HYDROCORTISONE ACETATE 25 MG RE SUPP: 25.0000 mg | Freq: Two times a day (BID) | RECTAL | Status: DC

## 2015-02-27 MED ORDER — NICOTINE 21 MG/24HR TD PT24: 21.0000 mg | MEDICATED_PATCH | Freq: Every day | TRANSDERMAL | Status: DC

## 2015-02-27 MED ORDER — ONDANSETRON HCL 4 MG PO TABS: 4.0000 mg | ORAL_TABLET | Freq: Three times a day (TID) | ORAL | Status: DC | PRN

## 2015-02-27 MED ORDER — ALBUTEROL SULFATE HFA 108 (90 BASE) MCG/ACT IN AERS: 2.0000 | INHALATION_SPRAY | Freq: Four times a day (QID) | RESPIRATORY_TRACT | Status: DC | PRN

## 2015-02-27 NOTE — Hospital Discharge Follow-Up (Signed)
Request from Purcell Mouton, RN  CM for a  a hospital follow up appointment for the patient. An appointment was scheduled for 03/09/15 @ 1000 and the information was placed on the AVS.  Update provided to Freddi Starr, RN CM.

## 2015-02-27 NOTE — Progress Notes (Signed)
Pt has Medication coverage with ADAP program.  Pt is approved from Oct. 2016 to March 2017 for his medications/Amy, RN with [place] [phone].  ADAP program use [facility] on [address]. [phone] for medications.

## 2015-02-27 NOTE — Discharge Summary (Signed)
Physician Discharge Summary  Jeremiah [name] PRF:[id number] DOB: [birth year] DOA: 02/25/2015  PCP: No primary care provider on file.  Admit date: 02/25/2015 Discharge date: 02/27/2015  Time spent: 65 minutes  Recommendations for Outpatient Follow-up:  1. Patient be discharged home. Patient is to follow-up with PCP/[facility] on 03/09/2015 at 10 AM. On follow-up patient will need a CBC and a basic metabolic profile done to follow-up on electrolytes and renal function.  Discharge Diagnoses:  Principal Problem:   CAP (community acquired pneumonia) Active Problems:   Anal warts   Dyslipidemia   Cigarette smoker   HIV (human immunodeficiency virus infection) ([place])   Hemorrhoids   Leukocytosis   Protein-calorie malnutrition, severe   Discharge Condition: Stable and improved.  Diet recommendation: Regular  Filed Weights   02/25/15 0741 02/25/15 0814  Weight: 70.761 kg (156 lb) 65.5 kg (144 lb 6.4 oz)    History of present illness:  Jeremiah [name] is a 44 y.o. male  With history of well-controlled HIV last viral load less than 20 in July 2016, latent syphilis which has been treated per ID note with last RPR down to 1:2, hemorrhoid and anal warts being followed at [facility] and patient scheduled for surgery on 03/02/2015, ongoing tobacco abuse, who presented to the ED with a one-day history of right-sided pleuritic chest pain that started the night prior to admission. Patient stated in the pain started in his right shoulder and moved to his right rib area and upper abdomen with some shortness of breath. Patient stated he had upper respiratory symptoms over the past 3-4 days prior to admission. Patient endorsed a productive cough of brownish sputum that started on the day of admission, chills, generalized weakness, subjective fevers.patient denies any chest pain, no headaches, no nausea, no vomiting, no diarrhea, no constipation, no dysuria. Patient denied any hematemesis. No  melena. Patient endorsed some occasional bright red blood noted on the toilet paper which he attributes to his hemorrhoids. Patient stated he got the flu vaccine this year.  patient was in the emergency room chest x-ray and CT scan which were done were consistent with a right-sided pneumonia. CT chest was negative for PE. Comprehensive metabolic profile had a bilirubin of 1.9 otherwise was unremarkable. CBC had a white count of 19.7 with a left shift otherwise was unremarkable. Urinalysis was unremarkable. Patient was given IV Rocephin and IV azithromycin.[facility] were called to admit the patient for further evaluation and management.  Hospital Course:  #1 Community acquired pneumonia Patient had presented with right-sided pleuritic chest pain. Workup of chest x-ray and CT scan were consistent with a pneumonia. Patient was admitted placed on oxygen, empiric IV Rocephin and azithromycin and patient pancultured. On admission patient was noted to have a fever however fever curve trended down and patient was afebrile by day of discharge. Patient was also noted on admission to have a leukocytosis with a white count of 19.7 which had normalized to 9.2 by day of discharge. Sputum Gram stain and culture which were obtained through Streptococcus group C. Patient improved clinically was transitioned to oral Augmentin which she tolerated. Patient was also on oxygen, Mucinex and scheduled naps throughout the hospitalization. Patient be discharged on 6 more days of oral Augmentin to complete a course of antibiotic therapy.   #2 well-controlled HIV Patient's last viral load was less than 08 November 2014. CD4 count obtained on 02/25/2015 was 600. Viral load was less than 20. Patient was maintained on his HIV medications of Truvada and  Kaletra. Outpatient follow-up.  #3 leukocytosis Likely secondary to problem #1. WBC trended down. Urinalysis was negative. Blood cultures pending with no growth to date. Sputum  Gram stain and cultures grew Streptococcus group C. Patient was initially placed empirically on IV Rocephin and azithromycin and transitioned to oral Augmentin which he tolerated. Patient be discharged on 6 more days of oral Augmentin to complete a course of antibiotic therapy.  #4 hemorrhoids/anal warts Patient was supposed to have surgery this Thursday, 03/02/2015 at [facility]. Patient had been advised that it might be probably wise to delay surgery due to his pneumonia.Patient was placed on  sitz baths, Senokot-S, FiberCon, Anusol suppositories.  #5 tobacco abuse Tobacco cessation. Nicotine patch.   Procedures:  Chest x-ray 02/25/2015  CT chest 02/25/2015    Consultations:  None  Discharge Exam: Filed Vitals:   02/27/15 0505  BP: 131/89  Pulse: 63  Temp: 98.4 F (36.9 C)  Resp: 18    General: NAD Cardiovascular: RRR Respiratory: CTAB  Discharge Instructions   Discharge Instructions    Diet general    Complete by:  As directed      Discharge instructions    Complete by:  As directed   Follow up with PCP in 2 weeks. Follow up in ID clinic as scheduled.     Increase activity slowly    Complete by:  As directed           Current Discharge Medication List    START taking these medications   Details  albuterol (PROVENTIL HFA;VENTOLIN HFA) 108 (90 BASE) MCG/ACT inhaler Inhale 2 puffs into the lungs every 6 (six) hours as needed for wheezing or shortness of breath. Use 2 puffs 3 times daily x 5 days then use as needed. Qty: 1 Inhaler, Refills: 0    amoxicillin-clavulanate (AUGMENTIN) 875-125 MG tablet Take 1 tablet by mouth every 12 (twelve) hours. TAKE FOR 6 DAYS THEN STOP. Qty: 12 tablet, Refills: 0    guaiFENesin (MUCINEX) 600 MG 12 hr tablet Take 2 tablets (1,200 mg total) by mouth 2 (two) times daily. Take for 6 days. Qty: 24 tablet, Refills: 0    hydrocortisone (ANUSOL-HC) 25 MG suppository Place 1 suppository (25 mg total) rectally 2 (two) times  daily. Qty: 24 suppository, Refills: 0    nicotine (NICODERM CQ - DOSED IN MG/24 HOURS) 21 mg/24hr patch Place 1 patch (21 mg total) onto the skin daily. Qty: 28 patch, Refills: 0    oxyCODONE (OXY IR/ROXICODONE) 5 MG immediate release tablet Take 1 tablet (5 mg total) by mouth every 6 (six) hours as needed for moderate pain. Qty: 20 tablet, Refills: 0    polycarbophil (FIBERCON) 625 MG tablet Take 1 tablet (625 mg total) by mouth daily.    senna-docusate (SENOKOT-S) 8.6-50 MG tablet Take 1 tablet by mouth 2 (two) times daily.      CONTINUE these medications which have CHANGED   Details  ondansetron (ZOFRAN) 4 MG tablet Take 1 tablet (4 mg total) by mouth every 8 (eight) hours as needed for nausea or vomiting. Qty: 20 tablet, Refills: 0   Associated Diagnoses: Nausea and vomiting, vomiting of unspecified type      CONTINUE these medications which have NOT CHANGED   Details  emtricitabine-tenofovir (TRUVADA) 200-300 MG per tablet Take 1 tablet by mouth daily. Qty: 30 tablet, Refills: 11   Associated Diagnoses: Human immunodeficiency virus disease ([place])    lopinavir-ritonavir (KALETRA) 200-50 MG per tablet Take 2 tablets by mouth 2 (two) times daily.  Qty: 120 tablet, Refills: 11   Associated Diagnoses: Human immunodeficiency virus disease ([place])    acetaminophen (TYLENOL) 500 MG tablet Take 1 tablet (500 mg total) by mouth every 6 (six) hours as needed. Qty: 30 tablet, Refills: 0    clotrimazole (LOTRIMIN AF) 1 % cream Apply 1 application topically 2 (two) times daily. To left foot between toes Qty: 30 g, Refills: 0      STOP taking these medications     acetaminophen-codeine (TYLENOL #3) 300-30 MG per tablet      methocarbamol (ROBAXIN) 500 MG tablet      predniSONE (DELTASONE) 20 MG tablet        Allergies  Allergen Reactions  . Nsaids Other (See Comments)    DRUG INTERACTION WITH HIV MEDS  . Sulfa Antibiotics Itching   Follow-up Information    Follow up with  [facility] On 03/09/2015.   Specialty:  Internal Medicine   Why:  at 10:00am for a hospital follow up appointment.  You will be called 2 days prior to the appointment to confirm  the appointment.   Contact information:   [address] [id number] [place] [phone]       The results of significant diagnostics from this hospitalization (including imaging, microbiology, ancillary and laboratory) are listed below for reference.    Significant Diagnostic Studies: Dg Chest 2 View  02/25/2015  CLINICAL DATA:  Right flank pain EXAM: CHEST  2 VIEW COMPARISON:  09/14/2013 FINDINGS: There are basilar opacities, right greater than left, suspicious for infectious infiltrates. No pleural effusions. Pulmonary vasculature is normal. IMPRESSION: Right greater than left bibasilar opacities, likely pneumonia. Followup PA and lateral chest X-ray is recommended in 3-4 weeks following trial of antibiotic therapy to ensure resolution and exclude underlying malignancy. Electronically Signed   By: Andreas Newport M.D.   On: 02/25/2015 02:28   Ct Angio Chest Pe W/cm &/or Wo Cm  02/25/2015  CLINICAL DATA:  Right flank pain which worsens when he coughs. Leukocytosis EXAM: CT ANGIOGRAPHY CHEST WITH CONTRAST TECHNIQUE: Multidetector CT imaging of the chest was performed using the standard protocol during bolus administration of intravenous contrast. Multiplanar CT image reconstructions and MIPs were obtained to evaluate the vascular anatomy. CONTRAST:  15mL OMNIPAQUE IOHEXOL 350 MG/ML SOLN COMPARISON:  Radiographs 02/25/2015 FINDINGS: Cardiovascular: There is good opacification of the pulmonary arteries. There is no pulmonary embolism. The thoracic aorta is normal in caliber and intact. Lungs: There is dense confluent consolidation in the right middle lobe with patchy areas of airspace opacity in the posterior lower lobes. This may represent pneumonia.  Pulmonary hemorrhage could also have this appearance. Prominent paraseptal emphysematous changes are present, upper lobe predominant. Central airways: Patent Effusions: None Lymphadenopathy: Small right hilar nodes, possibly reactive. Esophagus: Unremarkable Upper abdomen: Unremarkable Musculoskeletal: No significant abnormality Review of the MIP images confirms the above findings. IMPRESSION: 1. Negative for acute pulmonary embolism. 2. Dense consolidation in the right middle lobe. Patchy opacities in the lower lobes. Suspicious for pneumonia. 3. Prominent paraseptal emphysematous changes. Electronically Signed   By: Andreas Newport M.D.   On: 02/25/2015 05:03    Microbiology: Recent Results (from the past 240 hour(s))  Culture, blood (routine x 2)     Status: None (Preliminary result)   Collection Time: 02/25/15  6:23 AM  Result Value Ref Range Status   Specimen Description BLOOD BLOOD RIGHT FOREARM  Final   Special Requests BOTTLES DRAWN AEROBIC AND ANAEROBIC 5CC  Final   Culture   Final    NO GROWTH 1 DAY Performed at [facility]    Report Status PENDING  Incomplete  Culture, blood (routine x 2)     Status: None (Preliminary result)   Collection Time: 02/25/15  6:30 AM  Result Value Ref Range Status   Specimen Description BLOOD RIGHT ANTECUBITAL  Final   Special Requests BOTTLES DRAWN AEROBIC AND ANAEROBIC 5CC  Final   Culture   Final    NO GROWTH 1 DAY Performed at [facility]    Report Status PENDING  Incomplete  Culture, Urine     Status: None   Collection Time: 02/25/15  7:00 AM  Result Value Ref Range Status   Specimen Description URINE, CLEAN CATCH  Final   Special Requests NONE  Final   Culture   Final    20,000 COLONIES/mL GROUP B STREP(S.AGALACTIAE)ISOLATED TESTING AGAINST S. AGALACTIAE NOT ROUTINELY PERFORMED DUE TO PREDICTABILITY OF AMP/PEN/VAN SUSCEPTIBILITY. Performed at [facility]    Report Status 02/26/2015 FINAL  Final  Culture,  sputum-assessment     Status: None   Collection Time: 02/25/15  9:05 AM  Result Value Ref Range Status   Specimen Description SPUTUM  Final   Special Requests NONE  Final   Sputum evaluation   Final    THIS SPECIMEN IS ACCEPTABLE. RESPIRATORY CULTURE REPORT TO FOLLOW.   Report Status 02/25/2015 FINAL  Final  Culture, respiratory (NON-Expectorated)     Status: None   Collection Time: 02/25/15  9:05 AM  Result Value Ref Range Status   Specimen Description SPUTUM  Final   Special Requests NONE  Final   Gram Stain   Final    FEW WBC PRESENT, PREDOMINANTLY PMN NO SQUAMOUS EPITHELIAL CELLS SEEN FEW GRAM POSITIVE COCCI IN PAIRS Performed at [facility]    Culture   Final    RARE STREPTOCOCCUS GROUP C Performed at [facility]    Report Status 02/27/2015 FINAL  Final     Labs: Basic Metabolic Panel:  Recent Labs Lab 02/25/15 0329 02/25/15 0903 02/26/15 0438 02/27/15 0439  NA 139  --  139 140  K 3.5  --  4.1 3.8  CL 106  --  108 106  CO2 25  --  28 27  GLUCOSE 95  --  99 96  BUN 7  --  8 7  CREATININE 0.84  --  0.87 1.05  CALCIUM 8.7*  --  8.4* 8.7*  MG  --  1.8  --   --    Liver Function Tests:  Recent Labs Lab 02/25/15 0329  AST 29  ALT 18  ALKPHOS 76  BILITOT 1.9*  PROT 7.2  ALBUMIN 4.0   No results for input(s): LIPASE, AMYLASE in the last 168 hours. No results for input(s): AMMONIA in the last 168 hours. CBC:  Recent Labs Lab 02/25/15 0329 02/26/15 0438 02/27/15 0439  WBC 19.7* 12.0* 9.2  NEUTROABS 16.8* 7.8*  --   HGB 14.1 12.5* 12.9*  HCT 40.6 36.8* 37.9*  MCV 99.3 101.7* 100.3*  PLT 121* 106* 120*   Cardiac Enzymes: No results for input(s): CKTOTAL, CKMB, CKMBINDEX, TROPONINI in the last 168 hours. BNP: BNP (last 3 results) No results for input(s): BNP in the last 8760 hours.  ProBNP (last 3 results) No results for input(s): PROBNP in the last 8760 hours.  CBG: No results for input(s): GLUCAP in the last 168  hours.     Signed:  [facility] MD [facility] 02/27/2015, 12:14 PM   [id number]

## 2015-02-27 NOTE — Telephone Encounter (Signed)
Pt needing to change OP Surgery for this week.  Pt was scheduled for Thursday at [facility] for surgery.  Being released from [facility] after diagnosis of pneumonia.  Pt given [facility] number to call to reschedule the surgery.  He also had a question about his last pneumonia vaccine.  He received this May 03, 2013.

## 2015-03-02 LAB — CULTURE, BLOOD (ROUTINE X 2)
Culture: NO GROWTH
Culture: NO GROWTH

## 2015-03-09 ENCOUNTER — Encounter: Payer: Self-pay | Admitting: Family Medicine

## 2015-03-09 ENCOUNTER — Ambulatory Visit: Payer: Self-pay | Attending: Family Medicine | Admitting: Family Medicine

## 2015-03-09 VITALS — BP 127/79 | HR 74 | Temp 98.3°F | Resp 17 | Ht 71.0 in | Wt 152.0 lb

## 2015-03-09 DIAGNOSIS — F17210 Nicotine dependence, cigarettes, uncomplicated: Secondary | ICD-10-CM | POA: Insufficient documentation

## 2015-03-09 DIAGNOSIS — Z720 Tobacco use: Secondary | ICD-10-CM

## 2015-03-09 DIAGNOSIS — K649 Unspecified hemorrhoids: Secondary | ICD-10-CM | POA: Insufficient documentation

## 2015-03-09 DIAGNOSIS — Z09 Encounter for follow-up examination after completed treatment for conditions other than malignant neoplasm: Secondary | ICD-10-CM | POA: Insufficient documentation

## 2015-03-09 DIAGNOSIS — Z79899 Other long term (current) drug therapy: Secondary | ICD-10-CM | POA: Insufficient documentation

## 2015-03-09 DIAGNOSIS — J189 Pneumonia, unspecified organism: Secondary | ICD-10-CM

## 2015-03-09 DIAGNOSIS — B20 Human immunodeficiency virus [HIV] disease: Secondary | ICD-10-CM | POA: Insufficient documentation

## 2015-03-09 MED ORDER — ACETAMINOPHEN-CODEINE #3 300-30 MG PO TABS: 1.0000 | ORAL_TABLET | Freq: Three times a day (TID) | ORAL | Status: DC | PRN

## 2015-03-09 MED ORDER — HYDROCORTISONE ACETATE 25 MG RE SUPP: 25.0000 mg | Freq: Two times a day (BID) | RECTAL | Status: DC

## 2015-03-09 NOTE — Patient Instructions (Signed)

## 2015-03-09 NOTE — Progress Notes (Signed)
Pt's here for hospital f/up for pneumonia. Pt states that he's having Hemorroid pain since x60mo ago. Pt due to have surgery on the 9th of December.  Pain rated at 10+ with constant discomfort.

## 2015-03-09 NOTE — Progress Notes (Signed)
CC: Follow-up from hospitalization  HPI: Jeremiah [name] is a 44 y.o. male with a history of HIV, CD4 count of 600, viral load of less than 20 (currently on antiretroviral therapy), anal warts, latent syphilis recently hospitalized at [facility] for Community Acquired Pneumonia.   He had presented to the ED with cough productive of brownish sputum, chills, weakness.chest x-ray and CT scan which were done were consistent with a right-sided pneumonia. CT chest was negative for PE. Comprehensive metabolic profile had a bilirubin of 1.9 otherwise was unremarkable. CBC had a white count of 19.7 with a left shift otherwise was unremarkable. Urinalysis was unremarkable. Patient was admitted and placed on  IV Rocephin and IV azithromycin. He was also placed on oxygen; blood culture was no growth to date, respiratory culture grew rare group C, sputum culture was negative. He transitioned to oral Augmentin and his condition continued to improve with normalization of his WBC to 9.2 and he was subsequently discharged to complete a 10 day course of Augmentin.  Interval History: He reports his breathing is back to normal but complains of pain from his hemorrhoids and would like something for pain. He is scheduled to have surgery at [facility] on 03/30/2015  Allergies  Allergen Reactions  . Nsaids Other (See Comments)    DRUG INTERACTION WITH HIV MEDS  . Sulfa Antibiotics Itching   Past Medical History  Diagnosis Date  . HIV (human immunodeficiency virus infection) ([place])   . Pneumonia 2007  . History of tuberculin skin testing within last year 2014  . Anal warts   . Hemorrhoids 02/25/2015   Current Outpatient Prescriptions on File Prior to Visit  Medication Sig Dispense Refill  . albuterol (PROVENTIL HFA;VENTOLIN HFA) 108 (90 BASE) MCG/ACT inhaler Inhale 2 puffs into the lungs every 6 (six) hours as needed for wheezing or shortness of breath. Use 2 puffs 3 times daily x 5 days then use as  needed. 1 Inhaler 0  . emtricitabine-tenofovir (TRUVADA) 200-300 MG per tablet Take 1 tablet by mouth daily. 30 tablet 11  . guaiFENesin (MUCINEX) 600 MG 12 hr tablet Take 2 tablets (1,200 mg total) by mouth 2 (two) times daily. Take for 6 days. 24 tablet 0  . lopinavir-ritonavir (KALETRA) 200-50 MG per tablet Take 2 tablets by mouth 2 (two) times daily. 120 tablet 11  . senna-docusate (SENOKOT-S) 8.6-50 MG tablet Take 1 tablet by mouth 2 (two) times daily.    Marland Kitchen acetaminophen (TYLENOL) 500 MG tablet Take 1 tablet (500 mg total) by mouth every 6 (six) hours as needed. (Patient not taking: Reported on 02/25/2015) 30 tablet 0  . amoxicillin-clavulanate (AUGMENTIN) 875-125 MG tablet Take 1 tablet by mouth every 12 (twelve) hours. TAKE FOR 6 DAYS THEN STOP. (Patient not taking: Reported on 03/09/2015) 12 tablet 0  . clotrimazole (LOTRIMIN AF) 1 % cream Apply 1 application topically 2 (two) times daily. To left foot between toes (Patient not taking: Reported on 05/11/2014) 30 g 0  . hydrocortisone (ANUSOL-HC) 25 MG suppository Place 1 suppository (25 mg total) rectally 2 (two) times daily. (Patient not taking: Reported on 03/09/2015) 24 suppository 0  . nicotine (NICODERM CQ - DOSED IN MG/24 HOURS) 21 mg/24hr patch Place 1 patch (21 mg total) onto the skin daily. (Patient not taking: Reported on 03/09/2015) 28 patch 0  . ondansetron (ZOFRAN) 4 MG tablet Take 1 tablet (4 mg total) by mouth every 8 (eight) hours as needed for nausea or vomiting. (Patient not taking: Reported on 03/09/2015)  20 tablet 0  . polycarbophil (FIBERCON) 625 MG tablet Take 1 tablet (625 mg total) by mouth daily. (Patient not taking: Reported on 03/09/2015)     No current facility-administered medications on file prior to visit.   Family History  Problem Relation Age of Onset  . Diabetes Mother   . Hypertension Mother   . Cancer Mother     breast  . Diabetes Father   . Hypertension Father    Social History   Social History  .  Marital Status: Single    Spouse Name: N/A  . Number of Children: N/A  . Years of Education: N/A   Occupational History  . Not on file.   Social History Main Topics  . Smoking status: Current Every Day Smoker -- 1.00 packs/day for 24 years    Types: Cigarettes  . Smokeless tobacco: Never Used  . Alcohol Use: 0.0 oz/week    0 Standard drinks or equivalent per week     Comment: occasional  . Drug Use: No     Comment: Past history of crack cocaine  use   . Sexual Activity:    Partners: Male    Birth Control/ Protection: Condom     Comment: given condoms   Other Topics Concern  . Not on file   Social History Narrative    Review of Systems: Constitutional: Negative for fever, chills, diaphoresis, activity change, appetite change and fatigue. HENT: Negative for ear pain, nosebleeds, congestion, facial swelling, rhinorrhea, neck pain, neck stiffness and ear discharge.  Eyes: Negative for pain, discharge, redness, itching and visual disturbance. Respiratory: Negative for cough, choking, chest tightness, shortness of breath, wheezing and stridor.  Cardiovascular: Negative for chest pain, palpitations and leg swelling. Gastrointestinal: Negative for abdominal distention. Genitourinary: Negative for dysuria, urgency, frequency, hematuria, flank pain, decreased urine volume, difficulty urinating and dyspareunia.  Musculoskeletal: Negative for back pain, joint swelling, arthralgias and gait problem. Neurological: Negative for dizziness, tremors, seizures, syncope, facial asymmetry, speech difficulty, weakness, light-headedness, numbness and headaches.  Hematological: Negative for adenopathy. Does not bruise/bleed easily. Psychiatric/Behavioral: Negative for hallucinations, behavioral problems, confusion, dysphoric mood, decreased concentration and agitation.    Objective:   Filed Vitals:   03/09/15 1011  BP: 127/79  Pulse: 74  Temp: 98.3 F (36.8 C)  Resp: 17    Physical  Exam: Constitutional: Patient appears well-developed and well-nourished. No distress. CVS: RRR, S1/S2 +, no murmurs, no gallops, no carotid bruit.  Pulmonary: Effort and breath sounds normal, no stridor, rhonchi, wheezes, rales.  Abdominal: Soft. BS +,  no distension, tenderness, rebound or guarding.  Musculoskeletal: Normal range of motion. No edema and no tenderness.  Lymphadenopathy: No lymphadenopathy noted, cervical, inguinal or axillary Neuro: Alert. Normal reflexes, muscle tone coordination. No cranial nerve deficit. Skin: Skin is warm and dry. No rash noted. Not diaphoretic. No erythema. No pallor. Psychiatric: Normal mood and affect. Behavior, judgment, thought content normal.  Lab Results  Component Value Date   WBC 9.2 02/27/2015   HGB 12.9* 02/27/2015   HCT 37.9* 02/27/2015   MCV 100.3* 02/27/2015   PLT 120* 02/27/2015   Lab Results  Component Value Date   CREATININE 1.05 02/27/2015   BUN 7 02/27/2015   NA 140 02/27/2015   K 3.8 02/27/2015   CL 106 02/27/2015   CO2 27 02/27/2015    No results found for: HGBA1C Lipid Panel     Component Value Date/Time   CHOL 142 05/01/2014 0909   TRIG 76 05/01/2014 0909  HDL 55 05/01/2014 0909   CHOLHDL 2.6 05/01/2014 0909   VLDL 15 05/01/2014 0909   LDLCALC 72 05/01/2014 0909       Assessment and plan:  Community-acquired pneumonia: Resolved  HIV: Currently on antiretroviral therapy Scheduled to see infectious disease 30 on in the year  Hemorrhoids: Advised to use sitz bath. Placed on tramadol; refilled Anusol suppositories. Scheduled for surgery on 03/30/15.  Tobacco abuse: Spent 3 minutes counseling on smoking cessation and he is not ready to quit at this time.  This note has been created with Surveyor, quantity. Any transcriptional errors are unintentional.           Arnoldo Morale, MD. [facility] [phone] 03/09/2015, 10:36 AM

## 2015-04-03 ENCOUNTER — Emergency Department (HOSPITAL_COMMUNITY)
Admission: EM | Admit: 2015-04-03 | Discharge: 2015-04-03 | Disposition: A | Discharge status: Home / Self Care | Payer: Self-pay | Attending: Emergency Medicine | Admitting: Emergency Medicine

## 2015-04-03 ENCOUNTER — Encounter (HOSPITAL_COMMUNITY): Payer: Self-pay | Admitting: Neurology

## 2015-04-03 DIAGNOSIS — K625 Hemorrhage of anus and rectum: Secondary | ICD-10-CM | POA: Insufficient documentation

## 2015-04-03 DIAGNOSIS — Z7982 Long term (current) use of aspirin: Secondary | ICD-10-CM | POA: Insufficient documentation

## 2015-04-03 DIAGNOSIS — B20 Human immunodeficiency virus [HIV] disease: Secondary | ICD-10-CM | POA: Insufficient documentation

## 2015-04-03 DIAGNOSIS — Z8701 Personal history of pneumonia (recurrent): Secondary | ICD-10-CM | POA: Insufficient documentation

## 2015-04-03 DIAGNOSIS — Z8619 Personal history of other infectious and parasitic diseases: Secondary | ICD-10-CM | POA: Insufficient documentation

## 2015-04-03 DIAGNOSIS — F17210 Nicotine dependence, cigarettes, uncomplicated: Secondary | ICD-10-CM | POA: Insufficient documentation

## 2015-04-03 DIAGNOSIS — Z79899 Other long term (current) drug therapy: Secondary | ICD-10-CM | POA: Insufficient documentation

## 2015-04-03 LAB — BASIC METABOLIC PANEL
ANION GAP: 5 (ref 5–15)
BUN: 7 mg/dL (ref 6–20)
CHLORIDE: 108 mmol/L (ref 101–111)
CO2: 27 mmol/L (ref 22–32)
Calcium: 9.2 mg/dL (ref 8.9–10.3)
Creatinine, Ser: 0.97 mg/dL (ref 0.61–1.24)
GFR calc Af Amer: >60 mL/min (ref >60)
GLUCOSE: 99 mg/dL (ref 65–99)
POTASSIUM: 4.5 mmol/L (ref 3.5–5.1)
Sodium: 140 mmol/L (ref 135–145)

## 2015-04-03 LAB — CBC WITH DIFFERENTIAL/PLATELET
BASOS ABS: 0 10*3/uL (ref 0.0–0.1)
Basophils Relative: 0 %
EOS PCT: 3 %
Eosinophils Absolute: 0.2 10*3/uL (ref 0.0–0.7)
HEMATOCRIT: 37.5 % — AB (ref 39.0–52.0)
Hemoglobin: 13 g/dL (ref 13.0–17.0)
LYMPHS PCT: 47 %
Lymphs Abs: 3.4 10*3/uL (ref 0.7–4.0)
MCH: 35 pg — ABNORMAL HIGH (ref 26.0–34.0)
MCHC: 34.7 g/dL (ref 30.0–36.0)
MCV: 101.1 fL — AB (ref 78.0–100.0)
Monocytes Absolute: 0.5 10*3/uL (ref 0.1–1.0)
Monocytes Relative: 7 %
NEUTROS ABS: 3.1 10*3/uL (ref 1.7–7.7)
Neutrophils Relative %: 43 %
Platelets: 139 10*3/uL — ABNORMAL LOW (ref 150–400)
RBC: 3.71 MIL/uL — AB (ref 4.22–5.81)
RDW: 12.7 % (ref 11.5–15.5)
WBC: 7.2 10*3/uL (ref 4.0–10.5)

## 2015-04-03 NOTE — ED Provider Notes (Signed)
CSN: [id number]     Arrival date & time 04/03/15  1034 History   First MD Initiated Contact with Patient 04/03/15 1141     Chief Complaint  Patient presents with  . Rectal Bleeding     (Consider location/radiation/quality/duration/timing/severity/associated sxs/prior Treatment) HPI Jeremiah [name] is a 44 y.o. male with history of HIV-last CD4 600 and viral load less than 20, comes in for evaluation of rectal bleeding. Patient reports he was seen on 12/9 for surgical removal of anal warts and third-degree hemorrhoids. Patient reports a mild amount of bleeding since that time, but approximately 2 hours ago, "after walking a mile, a large amount of blood came out". He put a pad over the wound and came to the ED. He has not called his Psychologist, sport and exercise. Nothing makes the problem better or worse. No dizziness, chest pain, shortness of breath, numbness or weakness. No other modifying factors.  Past Medical History  Diagnosis Date  . HIV (human immunodeficiency virus infection) ([place])   . Pneumonia 2007  . History of tuberculin skin testing within last year 2014  . Anal warts   . Hemorrhoids 02/25/2015   Past Surgical History  Procedure Laterality Date  . Hernia repair  1991    Abdominal   . Unilateral orchiectomy     Family History  Problem Relation Age of Onset  . Diabetes Mother   . Hypertension Mother   . Cancer Mother     breast  . Diabetes Father   . Hypertension Father    Social History  Substance Use Topics  . Smoking status: Current Every Day Smoker -- 1.00 packs/day for 24 years    Types: Cigarettes  . Smokeless tobacco: Never Used  . Alcohol Use: 0.0 oz/week    0 Standard drinks or equivalent per week     Comment: occasional    Review of Systems A 10 point review of systems was completed and was negative except for pertinent positives and negatives as mentioned in the history of present illness     Allergies  Nsaids and Sulfa antibiotics  Home Medications   Prior to  Admission medications   Medication Sig Start Date End Date Taking? Authorizing Provider  albuterol (PROVENTIL HFA;VENTOLIN HFA) 108 (90 BASE) MCG/ACT inhaler Inhale 2 puffs into the lungs every 6 (six) hours as needed for wheezing or shortness of breath. Use 2 puffs 3 times daily x 5 days then use as needed. 02/27/15  Yes Eugenie Filler, MD  Aspirin-Acetaminophen-Caffeine (GOODY HEADACHE PO) Take 1 packet by mouth daily as needed (headache).   Yes Historical Provider, MD  emtricitabine-tenofovir (TRUVADA) 200-300 MG per tablet Take 1 tablet by mouth daily. 04/19/14  Yes Michel Bickers, MD  lopinavir-ritonavir (KALETRA) 200-50 MG per tablet Take 2 tablets by mouth 2 (two) times daily. 04/19/14  Yes Michel Bickers, MD  oxyCODONE-acetaminophen (PERCOCET) 5-325 MG tablet Take 1-2 tablets by mouth every 6 (six) hours as needed. 03/30/15  Yes Historical Provider, MD  acetaminophen (TYLENOL) 500 MG tablet Take 1 tablet (500 mg total) by mouth every 6 (six) hours as needed. Patient not taking: Reported on 02/25/2015 12/18/14   Marella Chimes, PA-C  acetaminophen-codeine (TYLENOL #3) 300-30 MG tablet Take 1 tablet by mouth every 8 (eight) hours as needed for moderate pain. Patient not taking: Reported on 04/03/2015 03/09/15   Arnoldo Morale, MD  guaiFENesin (MUCINEX) 600 MG 12 hr tablet Take 2 tablets (1,200 mg total) by mouth 2 (two) times daily. Take for 6 days. Patient not  taking: Reported on 04/03/2015 02/27/15   Eugenie Filler, MD  hydrocortisone (ANUSOL-HC) 25 MG suppository Place 1 suppository (25 mg total) rectally 2 (two) times daily. Patient not taking: Reported on 04/03/2015 03/09/15   Arnoldo Morale, MD  nicotine (NICODERM CQ - DOSED IN MG/24 HOURS) 21 mg/24hr patch Place 1 patch (21 mg total) onto the skin daily. Patient not taking: Reported on 03/09/2015 02/27/15   Eugenie Filler, MD  ondansetron (ZOFRAN) 4 MG tablet Take 1 tablet (4 mg total) by mouth every 8 (eight) hours as needed for  nausea or vomiting. Patient not taking: Reported on 03/09/2015 02/27/15   Eugenie Filler, MD  polycarbophil (FIBERCON) 625 MG tablet Take 1 tablet (625 mg total) by mouth daily. Patient not taking: Reported on 03/09/2015 02/27/15   Eugenie Filler, MD  senna-docusate (SENOKOT-S) 8.6-50 MG tablet Take 1 tablet by mouth 2 (two) times daily. Patient not taking: Reported on 04/03/2015 02/27/15   Eugenie Filler, MD   BP 113/89 mmHg  Pulse 53  Temp(Src) 98.7 F (37.1 C) (Oral)  Resp 18  SpO2 100% Physical Exam  Constitutional: He is oriented to person, place, and time. He appears well-developed and well-nourished.  HENT:  Head: Normocephalic and atraumatic.  Mouth/Throat: Oropharynx is clear and moist.  Eyes: Conjunctivae are normal. Pupils are equal, round, and reactive to light. Right eye exhibits no discharge. Left eye exhibits no discharge. No scleral icterus.  Neck: Neck supple.  Cardiovascular: Normal rate, regular rhythm and normal heart sounds.   Pulmonary/Chest: Effort normal and breath sounds normal. No respiratory distress. He has no wheezes. He has no rales.  Abdominal: Soft. There is no tenderness.  Genitourinary:  Chaperone present for rectal exam. See picture  Musculoskeletal: He exhibits no tenderness.  Neurological: He is alert and oriented to person, place, and time.  Cranial Nerves II-XII grossly intact  Skin: Skin is warm and dry. No rash noted.  Psychiatric: He has a normal mood and affect.  Nursing note and vitals reviewed.     ED Course  Procedures (including critical care time) Labs Review Labs Reviewed  CBC WITH DIFFERENTIAL/PLATELET - Abnormal; Notable for the following:    RBC 3.71 (*)    HCT 37.5 (*)    MCV 101.1 (*)    MCH 35.0 (*)    Platelets 139 (*)    All other components within normal limits  BASIC METABOLIC PANEL    Imaging Review No results found. I have personally reviewed and evaluated these images and lab results as part of my  medical decision-making.   EKG Interpretation None     Meds given in ED:  Medications - No data to display  Discharge Medication List as of 04/03/2015 12:58 PM     Filed Vitals:   04/03/15 1054 04/03/15 1145 04/03/15 1230  BP: 127/97 111/77 113/89  Pulse: 94 57 53  Temp: 98.7 F (37.1 C)    TempSrc: Oral    Resp: 20  18  SpO2: 99% 98% 100%    MDM  Jeremiah [name] is a 44 y.o. male history of well-controlled HIV comes in for elevation of rectal bleeding after recent anal wart and hemorrhoid surgery on 12/9. He reports bleeding started after he "walked a mile" and then had a bowel movement. He denies any dizziness, shortness of breath, chest pain. He has had a bowel movement in the ED without any blood loss. No active bleeding on exam. Discussed patient will need to follow-up with his  surgeon at [facility] in the next 1-2 days for further evaluation and management of his symptoms. On arrival, patient is here today with a stable with normal vital signs, afebrile. Hemoglobin is 13. Overall, patient appears well, nontoxic and is appropriate for outpatient follow-up. Prior to discharge I discussed and reviewed this case with my attending, Dr. Ralene Bathe, who agrees with the plan.  The patient appears reasonably screened and/or stabilized for discharge and I doubt any other medical condition or other [facility] requiring further screening, evaluation, or treatment in the ED at this time prior to discharge.   Final diagnoses:  Rectal bleeding        Comer Locket, PA-C 04/03/15 [place], MD 04/04/15 [phone]

## 2015-04-03 NOTE — Discharge Instructions (Signed)
It is important if you to follow-up with your surgeon, Dr. Charma Igo, for reevaluation of your symptoms. Your exam today was reassuring as were your labs. Please return to ED for evaluation of you begin to experience worsening bleeding, dizziness, shortness of breath or chest pain.

## 2015-04-03 NOTE — ED Notes (Signed)
Pt ambulated to bathroom 

## 2015-04-03 NOTE — ED Notes (Signed)
Pt concerned for blood loss and HIV status. Pt reassured we are getting him a room. Pt sat in subwaiting in triage with staff to monitor. Pt is ambulatory.

## 2015-04-03 NOTE — ED Notes (Addendum)
Pt reports rectal bleeding this morning, had hemorrhoid surgery on Friday. Reports bleeding is constant. Pt is alert, is agitated. Is upset that he isn't going right back.

## 2015-04-24 ENCOUNTER — Encounter: Payer: Self-pay | Admitting: Family Medicine

## 2015-04-24 ENCOUNTER — Other Ambulatory Visit: Payer: Self-pay | Admitting: Family Medicine

## 2015-04-24 ENCOUNTER — Ambulatory Visit: Payer: Self-pay | Attending: Family Medicine | Admitting: Family Medicine

## 2015-04-24 VITALS — BP 136/80 | HR 81 | Temp 98.7°F | Resp 16 | Ht 71.0 in | Wt 158.0 lb

## 2015-04-24 DIAGNOSIS — K649 Unspecified hemorrhoids: Secondary | ICD-10-CM | POA: Insufficient documentation

## 2015-04-24 DIAGNOSIS — J069 Acute upper respiratory infection, unspecified: Secondary | ICD-10-CM | POA: Insufficient documentation

## 2015-04-24 DIAGNOSIS — Z7982 Long term (current) use of aspirin: Secondary | ICD-10-CM | POA: Insufficient documentation

## 2015-04-24 DIAGNOSIS — A630 Anogenital (venereal) warts: Secondary | ICD-10-CM | POA: Insufficient documentation

## 2015-04-24 MED ORDER — HYDROCORTISONE ACE-PRAMOXINE 1-1 % RE CREA: 1.0000 "application " | TOPICAL_CREAM | Freq: Two times a day (BID) | RECTAL | Status: DC

## 2015-04-24 MED ORDER — DEXTROMETHORPHAN-GUAIFENESIN 10-100 MG/5ML PO LIQD: 10.0000 mL | Freq: Four times a day (QID) | ORAL | Status: DC | PRN

## 2015-04-24 MED ORDER — HYDROCORTISONE ACETATE 25 MG RE SUPP: 25.0000 mg | Freq: Two times a day (BID) | RECTAL | Status: DC

## 2015-04-24 MED ORDER — ACETAMINOPHEN-CODEINE #3 300-30 MG PO TABS: 1.0000 | ORAL_TABLET | Freq: Three times a day (TID) | ORAL | Status: DC | PRN

## 2015-04-24 MED FILL — ACETAMINOPHEN/COD #3 TABLET: 300-30 | 13 days supply | Qty: 40 | Fill #0

## 2015-04-24 MED FILL — HYDROCORT-PRAMOXINE 2.5-1%: 2.5-1 | 15 days supply | Qty: 30 | Fill #0

## 2015-04-24 MED FILL — ROBAFEN-DM SYRUP: 100-10 | 6 days supply | Qty: 237 | Fill #0

## 2015-04-24 NOTE — Progress Notes (Signed)
Patient c/o pain after hemorroid procedure. Patient describes pain as constant and painful with throbbing sensation rated 10+.   Patient also reports having a cough x2wks with yellowish-greenish sputum.

## 2015-04-24 NOTE — Progress Notes (Signed)
Subjective:  Patient ID: Jeremiah [name], male    DOB: July 30, 1970  Age: 45 y.o. MRN: [id number]  CC: Hemorrhoids and Cough   HPI Jeremiah [name] is a 45 year old male with a history of HIV, CD4 count of 600 (currently on antiretrovirals therapy), anal warts, hemorrhoids who recently underwent hemorrhoidectomy on 03/30/15 and is here today complaining of pain at the site. He received oxycodone pills from his surgeon at [facility] which he has run out AND when he called them he was referred to go to the emergency room.  He also complains of cough productive of greenish sputum but no shortness of breath or wheezing and has no fever.  Outpatient Prescriptions Prior to Visit  Medication Sig Dispense Refill  . albuterol (PROVENTIL HFA;VENTOLIN HFA) 108 (90 BASE) MCG/ACT inhaler Inhale 2 puffs into the lungs every 6 (six) hours as needed for wheezing or shortness of breath. Use 2 puffs 3 times daily x 5 days then use as needed. 1 Inhaler 0  . Aspirin-Acetaminophen-Caffeine (GOODY HEADACHE PO) Take 1 packet by mouth daily as needed (headache).    Marland Kitchen emtricitabine-tenofovir (TRUVADA) 200-300 MG per tablet Take 1 tablet by mouth daily. 30 tablet 11  . lopinavir-ritonavir (KALETRA) 200-50 MG per tablet Take 2 tablets by mouth 2 (two) times daily. 120 tablet 11  . oxyCODONE-acetaminophen (PERCOCET) 5-325 MG tablet Take 1-2 tablets by mouth every 6 (six) hours as needed.    Marland Kitchen acetaminophen (TYLENOL) 500 MG tablet Take 1 tablet (500 mg total) by mouth every 6 (six) hours as needed. (Patient not taking: Reported on 02/25/2015) 30 tablet 0  . guaiFENesin (MUCINEX) 600 MG 12 hr tablet Take 2 tablets (1,200 mg total) by mouth 2 (two) times daily. Take for 6 days. (Patient not taking: Reported on 04/03/2015) 24 tablet 0  . nicotine (NICODERM CQ - DOSED IN MG/24 HOURS) 21 mg/24hr patch Place 1 patch (21 mg total) onto the skin daily. (Patient not taking: Reported on 03/09/2015) 28 patch 0  .  ondansetron (ZOFRAN) 4 MG tablet Take 1 tablet (4 mg total) by mouth every 8 (eight) hours as needed for nausea or vomiting. (Patient not taking: Reported on 03/09/2015) 20 tablet 0  . polycarbophil (FIBERCON) 625 MG tablet Take 1 tablet (625 mg total) by mouth daily. (Patient not taking: Reported on 03/09/2015)    . senna-docusate (SENOKOT-S) 8.6-50 MG tablet Take 1 tablet by mouth 2 (two) times daily. (Patient not taking: Reported on 04/03/2015)    . acetaminophen-codeine (TYLENOL #3) 300-30 MG tablet Take 1 tablet by mouth every 8 (eight) hours as needed for moderate pain. (Patient not taking: Reported on 04/03/2015) 40 tablet 0  . hydrocortisone (ANUSOL-HC) 25 MG suppository Place 1 suppository (25 mg total) rectally 2 (two) times daily. (Patient not taking: Reported on 04/03/2015) 24 suppository 0   No facility-administered medications prior to visit.    ROS Review of Systems  Constitutional: Negative for activity change and appetite change.  HENT: Negative for sinus pressure and sore throat.   Respiratory: Positive for cough. Negative for chest tightness, shortness of breath and wheezing.   Cardiovascular: Negative for chest pain and palpitations.  Gastrointestinal: Negative for abdominal pain, constipation and abdominal distention.  Genitourinary: Negative.   Musculoskeletal: Negative.   Psychiatric/Behavioral: Negative for behavioral problems and dysphoric mood.    Objective:  BP 136/80 mmHg  Pulse 81  Temp(Src) 98.7 F (37.1 C) (Oral)  Resp 16  Ht 5\' 11"  (1.803 m)  Wt 158 lb (71.668  kg)  BMI 22.05 kg/m2  SpO2 97%  BP/Weight 04/24/2015 04/03/2015 [id number]  Systolic BP [id number]  Diastolic BP 80 89 79  Wt. (Lbs) 158 - 152  BMI 22.05 - 21.21      Physical Exam  Constitutional: He is oriented to person, place, and time. He appears well-developed and well-nourished.  HENT:  Cerumen obscuring bilateral tympanic membrane  Cardiovascular: Normal rate, normal heart  sounds and intact distal pulses.   No murmur heard. Pulmonary/Chest: Effort normal and breath sounds normal. He has no wheezes. He has no rales. He exhibits no tenderness.  Abdominal: Soft. Bowel sounds are normal. He exhibits no distension and no mass. There is no tenderness.  Musculoskeletal: Normal range of motion.  Neurological: He is alert and oriented to person, place, and time.     Assessment & Plan:   1. Hemorrhoids, unspecified hemorrhoid type Advised to continue sitz baths and get in touch with his surgeon in the event that pain persists - hydrocortisone (ANUSOL-HC) 25 MG suppository; Place 1 suppository (25 mg total) rectally 2 (two) times daily.  Dispense: 24 suppository; Refill: 0 - acetaminophen-codeine (TYLENOL #3) 300-30 MG tablet; Take 1 tablet by mouth every 8 (eight) hours as needed for moderate pain.  Dispense: 40 tablet; Refill: 0  2. Anal warts Scheduled for another surgery later this month  3. Acute upper respiratory infection No indication for antibiotic at this time; symptomatic management - dextromethorphan-guaiFENesin (TUSSIN DM) 10-100 MG/5ML liquid; Take 10 mLs by mouth every 6 (six) hours as needed for cough.  Dispense: 237 mL; Refill: 0   Meds ordered this encounter  Medications  . hydrocortisone (ANUSOL-HC) 25 MG suppository    Sig: Place 1 suppository (25 mg total) rectally 2 (two) times daily.    Dispense:  24 suppository    Refill:  0  . dextromethorphan-guaiFENesin (TUSSIN DM) 10-100 MG/5ML liquid    Sig: Take 10 mLs by mouth every 6 (six) hours as needed for cough.    Dispense:  237 mL    Refill:  0  . acetaminophen-codeine (TYLENOL #3) 300-30 MG tablet    Sig: Take 1 tablet by mouth every 8 (eight) hours as needed for moderate pain.    Dispense:  40 tablet    Refill:  0    Follow-up: Return in about 3 months (around 07/23/2015) for follow up of chronic conditions.   Arnoldo Morale MD

## 2015-04-26 ENCOUNTER — Telehealth: Payer: Self-pay | Admitting: *Deleted

## 2015-04-26 NOTE — Telephone Encounter (Signed)
Patient called in to report "allergy" to his Tylenol #3 that he was prescribed recently.  He first states that it is not helping his pain and then states it is causing him to itch and have migraines.  Patient asks for prescription for hydrocodone and RN explains that MD does not typically prescribed that type of pain medication and that she may be willing to prescribe Tramadol.  Explained to patient that the MD was out of the clinic until the morning and this message would be forwarded to her.

## 2015-04-27 NOTE — Telephone Encounter (Signed)
He received Tylenol #3 from me prior to his hemorrhoidectomy and never complained of any symptoms at that time. If he is unable to tolerate this medication he will have to call his general surgeon who performed his hemorrhoidectomy as the pain medication I gave him was only a courtesy refill since he could not obtain it from his surgeon.

## 2015-04-27 NOTE — Telephone Encounter (Signed)
Attempted to contact patient with MD response to trouble with his pain medication but number is not in service

## 2015-05-03 ENCOUNTER — Other Ambulatory Visit: Payer: Self-pay | Admitting: Internal Medicine

## 2015-05-04 ENCOUNTER — Other Ambulatory Visit: Payer: Self-pay | Admitting: *Deleted

## 2015-05-04 DIAGNOSIS — B20 Human immunodeficiency virus [HIV] disease: Secondary | ICD-10-CM

## 2015-05-04 MED ORDER — EMTRICITABINE-TENOFOVIR DF 200-300 MG PO TABS: 1.0000 | ORAL_TABLET | Freq: Every day | ORAL | Status: DC

## 2015-05-04 MED ORDER — LOPINAVIR-RITONAVIR 200-50 MG PO TABS: 2.0000 | ORAL_TABLET | Freq: Two times a day (BID) | ORAL | Status: DC

## 2015-05-04 NOTE — Telephone Encounter (Signed)
Please contact your [facility] Case Manager to reapply for ADAP.  Thank you.

## 2015-05-16 ENCOUNTER — Ambulatory Visit: Payer: Self-pay

## 2015-05-24 ENCOUNTER — Ambulatory Visit: Payer: Self-pay

## 2015-05-28 ENCOUNTER — Ambulatory Visit: Payer: Self-pay

## 2015-05-28 MED FILL — IMIQUIMOD 5% CREAM PACKET: 5 | 60 days supply | Qty: 24 | Fill #0

## 2015-05-28 MED FILL — LIDOCAINE 5% OINTMENT: 5 | 15 days supply | Qty: 35 | Fill #0

## 2015-06-17 ENCOUNTER — Emergency Department (HOSPITAL_COMMUNITY)
Admission: EM | Admit: 2015-06-17 | Discharge: 2015-06-17 | Disposition: A | Discharge status: Home / Self Care | Payer: Self-pay | Attending: Emergency Medicine | Admitting: Emergency Medicine

## 2015-06-17 ENCOUNTER — Encounter (HOSPITAL_COMMUNITY): Payer: Self-pay

## 2015-06-17 ENCOUNTER — Emergency Department (HOSPITAL_COMMUNITY): Payer: Self-pay

## 2015-06-17 ENCOUNTER — Emergency Department (EMERGENCY_DEPARTMENT_HOSPITAL)
Admit: 2015-06-17 | Discharge: 2015-06-17 | Disposition: A | Discharge status: Home / Self Care | Payer: Self-pay | Attending: Emergency Medicine | Admitting: Emergency Medicine

## 2015-06-17 DIAGNOSIS — M79602 Pain in left arm: Secondary | ICD-10-CM

## 2015-06-17 DIAGNOSIS — Z7952 Long term (current) use of systemic steroids: Secondary | ICD-10-CM | POA: Insufficient documentation

## 2015-06-17 DIAGNOSIS — F17210 Nicotine dependence, cigarettes, uncomplicated: Secondary | ICD-10-CM | POA: Insufficient documentation

## 2015-06-17 DIAGNOSIS — R0789 Other chest pain: Secondary | ICD-10-CM | POA: Insufficient documentation

## 2015-06-17 DIAGNOSIS — Z79899 Other long term (current) drug therapy: Secondary | ICD-10-CM | POA: Insufficient documentation

## 2015-06-17 DIAGNOSIS — Z8701 Personal history of pneumonia (recurrent): Secondary | ICD-10-CM | POA: Insufficient documentation

## 2015-06-17 DIAGNOSIS — M79632 Pain in left forearm: Secondary | ICD-10-CM | POA: Insufficient documentation

## 2015-06-17 DIAGNOSIS — B20 Human immunodeficiency virus [HIV] disease: Secondary | ICD-10-CM | POA: Insufficient documentation

## 2015-06-17 DIAGNOSIS — M25512 Pain in left shoulder: Secondary | ICD-10-CM | POA: Insufficient documentation

## 2015-06-17 DIAGNOSIS — Z8719 Personal history of other diseases of the digestive system: Secondary | ICD-10-CM | POA: Insufficient documentation

## 2015-06-17 DIAGNOSIS — M79605 Pain in left leg: Secondary | ICD-10-CM

## 2015-06-17 LAB — CBC WITH DIFFERENTIAL/PLATELET
Basophils Absolute: 0 K/uL (ref 0.0–0.1)
Basophils Relative: 0 %
Eosinophils Absolute: 0.1 K/uL (ref 0.0–0.7)
Eosinophils Relative: 2 %
HCT: 41.9 % (ref 39.0–52.0)
Hemoglobin: 14.3 g/dL (ref 13.0–17.0)
Lymphocytes Relative: 38 %
Lymphs Abs: 1.7 K/uL (ref 0.7–4.0)
MCH: 34.4 pg — ABNORMAL HIGH (ref 26.0–34.0)
MCHC: 34.1 g/dL (ref 30.0–36.0)
MCV: 100.7 fL — ABNORMAL HIGH (ref 78.0–100.0)
Monocytes Absolute: 0.6 K/uL (ref 0.1–1.0)
Monocytes Relative: 13 %
Neutro Abs: 2.1 K/uL (ref 1.7–7.7)
Neutrophils Relative %: 47 %
Platelets: 138 K/uL — ABNORMAL LOW (ref 150–400)
RBC: 4.16 MIL/uL — ABNORMAL LOW (ref 4.22–5.81)
RDW: 12.2 % (ref 11.5–15.5)
WBC: 4.4 K/uL (ref 4.0–10.5)

## 2015-06-17 LAB — BASIC METABOLIC PANEL WITH GFR
Anion gap: 6 (ref 5–15)
BUN: 14 mg/dL (ref 6–20)
CO2: 28 mmol/L (ref 22–32)
Calcium: 8.8 mg/dL — ABNORMAL LOW (ref 8.9–10.3)
Chloride: 110 mmol/L (ref 101–111)
Creatinine, Ser: 0.95 mg/dL (ref 0.61–1.24)
GFR calc Af Amer: >60 mL/min
GFR calc non Af Amer: >60 mL/min
Glucose, Bld: 98 mg/dL (ref 65–99)
Potassium: 3.8 mmol/L (ref 3.5–5.1)
Sodium: 144 mmol/L (ref 135–145)

## 2015-06-17 LAB — I-STAT TROPONIN, ED
Troponin i, poc: 0 ng/mL (ref 0.00–0.08)
Troponin i, poc: 0 ng/mL (ref 0.00–0.08)

## 2015-06-17 MED ORDER — TRAMADOL HCL 50 MG PO TABS: 50.0000 mg | ORAL_TABLET | Freq: Four times a day (QID) | ORAL | Status: DC | PRN

## 2015-06-17 MED ORDER — HYDROCODONE-ACETAMINOPHEN 5-325 MG PO TABS
1.0000 | ORAL_TABLET | Freq: Once | ORAL | Status: AC
  Administered 2015-06-17: 1 via ORAL
  Filled 2015-06-17: qty 1

## 2015-06-17 NOTE — ED Provider Notes (Signed)
   MSE was initiated and I personally evaluated the patient and placed orders (if any) at  1:47 PM on February 59, 5154.  45 year old male with past medical history of HIV, last CD4 count 600 presents for complaint of sudden onset left arm pain. Patient states that he had acute onset, atraumatic, sharp left arm pain below his elbow followed by left hand and shoulder numbness. During this episode patient experienced dizziness and shortness of breath. Patient states the pain lasted for approximately 3-4 minutes before resolving. He is still experiencing numbness in his left shoulder and states there is a tingling sensation in his left chest. No diaphoresis. Vitals are stable. Normal cardiac and lung exam. No neurological deficits. Recommend ACS workup. Will move patient to acute care for the remainder of this workup. Patient is currently hemodynamically stable.  The patient appears stable so that the remainder of the MSE may be completed by another provider.  Dondra Spry Tiburones, PA-C 06/17/15 1351  Veryl Speak, MD 06/17/15 [phone]

## 2015-06-17 NOTE — ED Notes (Signed)
Pt stated that he called EMS for arm pain. Pt reports pain in l/forearm x 90 minutes. Pain traveled to l/hand. Pain is currently in l/shoulder. Pt reports "ringing and headaches" x 1 week.

## 2015-06-17 NOTE — ED Provider Notes (Signed)
CSN: [id number]     Arrival date & time 06/17/15  1240 History   First MD Initiated Contact with Patient 06/17/15 1323     Chief Complaint  Patient presents with  . Arm Pain     (Consider location/radiation/quality/duration/timing/severity/associated sxs/prior Treatment) HPI Jeremiah [name] is a 45 y.o. male with hx of HIV, last CD4 count 600 on 02/25/15, pneumonia, presents to ED with complaint of sudden onset, atraumatic, left arm pain that started at noon today. Patient states he was sitting at home and relaxing when suddenly developed sharp pain in left forearm that radiated up into the left shoulder and left chest. He states that his hand went "tingly." He states lasted several minutes and improved. Now he reports some dull pain and tightness sensation in left forearm and shoulder. He denied any associated shortness of breath. He states his chest pain is not resolved. He denies similar symptoms in the past. He denies any strenuous activity this morning, yesterday, day before yesterday. Patient is right-handed. Denies any weakness or numbness in the hand. No treatment prior to coming in.  Past Medical History  Diagnosis Date  . HIV (human immunodeficiency virus infection) ([place])   . Pneumonia 2007  . History of tuberculin skin testing within last year 2014  . Anal warts   . Hemorrhoids 02/25/2015   Past Surgical History  Procedure Laterality Date  . Hernia repair  1991    Abdominal   . Unilateral orchiectomy     Family History  Problem Relation Age of Onset  . Diabetes Mother   . Hypertension Mother   . Cancer Mother     breast  . Diabetes Father   . Hypertension Father    Social History  Substance Use Topics  . Smoking status: Current Every Day Smoker -- 1.00 packs/day for 24 years    Types: Cigarettes  . Smokeless tobacco: Never Used  . Alcohol Use: 0.0 oz/week    0 Standard drinks or equivalent per week     Comment: occasional    Review of Systems  Constitutional:  Negative for fever and chills.  Respiratory: Positive for chest tightness. Negative for cough and shortness of breath.   Cardiovascular: Positive for chest pain. Negative for palpitations and leg swelling.  Gastrointestinal: Negative for nausea, vomiting, abdominal pain, diarrhea and abdominal distention.  Genitourinary: Negative for dysuria, urgency, frequency and hematuria.  Musculoskeletal: Positive for myalgias and arthralgias. Negative for neck pain and neck stiffness.  Skin: Negative for rash.  Allergic/Immunologic: Negative for immunocompromised state.  Neurological: Negative for dizziness, weakness, light-headedness, numbness and headaches.  All other systems reviewed and are negative.     Allergies  Nsaids and Sulfa antibiotics  Home Medications   Prior to Admission medications   Medication Sig Start Date End Date Taking? Authorizing Provider  albuterol (PROVENTIL HFA;VENTOLIN HFA) 108 (90 BASE) MCG/ACT inhaler Inhale 2 puffs into the lungs every 6 (six) hours as needed for wheezing or shortness of breath. Use 2 puffs 3 times daily x 5 days then use as needed. 02/27/15  Yes Eugenie Filler, MD  emtricitabine-tenofovir (TRUVADA) 200-300 MG tablet Take 1 tablet by mouth daily. 05/04/15  Yes Michel Bickers, MD  gabapentin (NEURONTIN) 100 MG capsule Take 300 mg by mouth 3 (three) times daily. 05/25/15 06/24/15 Yes Historical Provider, MD  imiquimod (ALDARA) 5 % cream Place 1 application rectally 3 (three) times a week. 05/25/15  Yes Historical Provider, MD  lidocaine (XYLOCAINE) 5 % ointment Apply 1 application topically 3 (  three) times daily as needed for mild pain (anal area).  05/28/15  Yes Historical Provider, MD  lopinavir-ritonavir (KALETRA) 200-50 MG tablet Take 2 tablets by mouth 2 (two) times daily. 05/04/15  Yes Michel Bickers, MD  acetaminophen (TYLENOL) 500 MG tablet Take 1 tablet (500 mg total) by mouth every 6 (six) hours as needed. Patient not taking: Reported on 02/25/2015  12/18/14   Marella Chimes, PA-C  acetaminophen-codeine (TYLENOL #3) 300-30 MG tablet Take 1 tablet by mouth every 8 (eight) hours as needed for moderate pain. 04/24/15   Arnoldo Morale, MD  Aspirin-Acetaminophen-Caffeine (GOODY HEADACHE PO) Take 1 packet by mouth daily as needed (headache).    Historical Provider, MD  dextromethorphan-guaiFENesin (TUSSIN DM) 10-100 MG/5ML liquid Take 10 mLs by mouth every 6 (six) hours as needed for cough. 04/24/15   Arnoldo Morale, MD  nicotine (NICODERM CQ - DOSED IN MG/24 HOURS) 21 mg/24hr patch Place 1 patch (21 mg total) onto the skin daily. Patient not taking: Reported on 03/09/2015 02/27/15   Eugenie Filler, MD  ondansetron (ZOFRAN) 4 MG tablet Take 1 tablet (4 mg total) by mouth every 8 (eight) hours as needed for nausea or vomiting. Patient not taking: Reported on 03/09/2015 02/27/15   Eugenie Filler, MD  polycarbophil (FIBERCON) 625 MG tablet Take 1 tablet (625 mg total) by mouth daily. Patient not taking: Reported on 03/09/2015 02/27/15   Eugenie Filler, MD  pramoxine-hydrocortisone [facility]) 1-1 % rectal cream Place 1 application rectally 2 (two) times daily. 04/24/15   Arnoldo Morale, MD  senna-docusate (SENOKOT-S) 8.6-50 MG tablet Take 1 tablet by mouth 2 (two) times daily. Patient not taking: Reported on 04/03/2015 02/27/15   Eugenie Filler, MD   BP 127/89 mmHg  Pulse 59  Temp(Src) 98.1 F (36.7 C) (Oral)  Resp 14  SpO2 98% Physical Exam  Constitutional: He is oriented to person, place, and time. He appears well-developed and well-nourished. No distress.  HENT:  Head: Normocephalic and atraumatic.  Eyes: Conjunctivae are normal.  Neck: Neck supple.  Cardiovascular: Normal rate, regular rhythm and normal heart sounds.   Pulmonary/Chest: Effort normal and breath sounds normal. No respiratory distress. He has no wheezes. He has no rales.  Abdominal: Soft. Bowel sounds are normal. He exhibits no distension. There is no tenderness. There is  no rebound.  Musculoskeletal: He exhibits no edema.  No obvious swelling to the left arm, forearm, hand. Distal radial pulses intact and equal bilaterally. TTP over lateral forearm, anterior shoulder, left upper anterior chest. Mild pain with pronation and left shoulder movement.   Neurological: He is alert and oriented to person, place, and time.  5/5 and equal grip strength bilaterally, strength of bicep, tricept, deltoid strength 5/5. Hand is warm, cap refill <2 sec  Skin: Skin is warm and dry.  Nursing note and vitals reviewed.   ED Course  Procedures (including critical care time) Labs Review Labs Reviewed  CBC WITH DIFFERENTIAL/PLATELET - Abnormal; Notable for the following:    RBC 4.16 (*)    MCV 100.7 (*)    MCH 34.4 (*)    Platelets 138 (*)    All other components within normal limits  BASIC METABOLIC PANEL - Abnormal; Notable for the following:    Calcium 8.8 (*)    All other components within normal limits  I-STAT TROPOININ, ED  Randolm Idol, ED    Imaging Review Dg Chest 2 View  06/17/2015  CLINICAL DATA:  Shoulder pain, chest pain today. EXAM: CHEST  2 VIEW COMPARISON:  CT and chest x-ray 02/25/2015 FINDINGS: The heart size and mediastinal contours are within normal limits. Both lungs are clear. The visualized skeletal structures are unremarkable. IMPRESSION: No active cardiopulmonary disease. Electronically Signed   By: Rolm Baptise M.D.   On: 06/17/2015 14:27   I have personally reviewed and evaluated these images and lab results as part of my medical decision-making.   EKG Interpretation   Date/Time:  Sunday June 17 2015 14:26:50 EST Ventricular Rate:  63 PR Interval:  130 QRS Duration: 83 QT Interval:  420 QTC Calculation: 430 R Axis:   75 Text Interpretation:  Sinus rhythm No significant change since last  tracing since 04/09/2013 Confirmed by [facility] MD, Corene Cornea [id number]) on  06/17/2015 11:12:49 PM      MDM   Final diagnoses:  Arm pain, diffuse,  left   Patient emergency room with sudden onset of left arm pain, nonspecific chest pain. Will get ecg, markers, labs. Will get venous dopler of the arm to ro DVT.   Labs negative. Venous doppler negative. Repeat troponin is 0. Patient is low risk for coronary disease, and outpatient treatment is appropriate. I also suspect that his arm pain is not cardiac in nature, however is most likely musculoskeletal. Will prescribe tramadol, patient is allergic to NSAIDs. Will have him follow-up with primary care doctor. At this time he is neurovascularly intact with normal vital signs.  Filed Vitals:   06/17/15 1800 06/17/15 1935  BP: 136/97 134/87  Pulse: 61 61  Temp:    Resp: 24 [id number] PA-C 06/17/15 2310  Jeannett Senior, PA-C 06/17/15 2313  Merrily Pew, MD 06/20/15 1104

## 2015-06-17 NOTE — ED Notes (Signed)
Per EMS, pt from home.  Pt c/o left arm pain.  Pain is now in shoulder.  Pt woke up with the pain.  Vitals: 148/72, hr 90, 98% ra,

## 2015-06-17 NOTE — Progress Notes (Signed)
*  Preliminary Results* Left upper extremity venous duplex completed. Left upper extremity is negative for deep and superficial vein thrombosis.  06/17/2015 6:37 PM  Maudry Mayhew, RVT, RDCS, RDMS

## 2015-06-17 NOTE — ED Notes (Signed)
Patient given [place] sandwich, cheese, crackers, soda, and water. Patient dissatisfied by not being able to order meal from cafeteria.

## 2015-06-17 NOTE — ED Notes (Signed)
Bed: [id number] Expected date:  Expected time:  Means of arrival:  Comments: Hold for 29

## 2015-06-17 NOTE — Discharge Instructions (Signed)
Take pain medications as prescribed as needed only. Follow up with primary care doctor for recheck if pain continues.    Musculoskeletal Pain Musculoskeletal pain is muscle and boney aches and pains. These pains can occur in any part of the body. Your caregiver may treat you without knowing the cause of the pain. They may treat you if blood or urine tests, X-rays, and other tests were normal.  CAUSES There is often not a definite cause or reason for these pains. These pains may be caused by a type of germ (virus). The discomfort may also come from overuse. Overuse includes working out too hard when your body is not fit. Boney aches also come from weather changes. Bone is sensitive to atmospheric pressure changes. HOME CARE INSTRUCTIONS   Ask when your test results will be ready. Make sure you get your test results.  Only take over-the-counter or prescription medicines for pain, discomfort, or fever as directed by your caregiver. If you were given medications for your condition, do not drive, operate machinery or power tools, or sign legal documents for 24 hours. Do not drink alcohol. Do not take sleeping pills or other medications that may interfere with treatment.  Continue all activities unless the activities cause more pain. When the pain lessens, slowly resume normal activities. Gradually increase the intensity and duration of the activities or exercise.  During periods of severe pain, bed rest may be helpful. Lay or sit in any position that is comfortable.  Putting ice on the injured area.  Put ice in a bag.  Place a towel between your skin and the bag.  Leave the ice on for 15 to 20 minutes, 3 to 4 times a day.  Follow up with your caregiver for continued problems and no reason can be found for the pain. If the pain becomes worse or does not go away, it may be necessary to repeat tests or do additional testing. Your caregiver may need to look further for a possible cause. SEEK  IMMEDIATE MEDICAL CARE IF:  You have pain that is getting worse and is not relieved by medications.  You develop chest pain that is associated with shortness or breath, sweating, feeling sick to your stomach (nauseous), or throw up (vomit).  Your pain becomes localized to the abdomen.  You develop any new symptoms that seem different or that concern you. MAKE SURE YOU:   Understand these instructions.  Will watch your condition.  Will get help right away if you are not doing well or get worse.   This information is not intended to replace advice given to you by your health care provider. Make sure you discuss any questions you have with your health care provider.   Document Released: 04/07/2005 Document Revised: 06/30/2011 Document Reviewed: 12/10/2012 Elsevier Interactive Patient Education [employer].

## 2015-06-19 ENCOUNTER — Other Ambulatory Visit (INDEPENDENT_AMBULATORY_CARE_PROVIDER_SITE_OTHER): Payer: Self-pay

## 2015-06-19 ENCOUNTER — Encounter: Payer: Self-pay | Admitting: Clinical

## 2015-06-19 DIAGNOSIS — Z113 Encounter for screening for infections with a predominantly sexual mode of transmission: Secondary | ICD-10-CM

## 2015-06-19 DIAGNOSIS — Z79899 Other long term (current) drug therapy: Secondary | ICD-10-CM

## 2015-06-19 DIAGNOSIS — B20 Human immunodeficiency virus [HIV] disease: Secondary | ICD-10-CM

## 2015-06-19 LAB — LIPID PANEL
CHOLESTEROL: 139 mg/dL (ref 125–200)
HDL: 67 mg/dL (ref >=40)
LDL CALC: 58 mg/dL (ref <130)
TRIGLYCERIDES: 71 mg/dL (ref <150)
Total CHOL/HDL Ratio: 2.1 Ratio (ref <=5.0)
VLDL: 14 mg/dL (ref <30)

## 2015-06-19 LAB — CBC
HCT: 42.3 % (ref 39.0–52.0)
HEMOGLOBIN: 14.3 g/dL (ref 13.0–17.0)
MCH: 34 pg (ref 26.0–34.0)
MCHC: 33.8 g/dL (ref 30.0–36.0)
MCV: 100.7 fL — ABNORMAL HIGH (ref 78.0–100.0)
MPV: 10 fL (ref 8.6–12.4)
Platelets: 131 10*3/uL — ABNORMAL LOW (ref 150–400)
RBC: 4.2 MIL/uL — AB (ref 4.22–5.81)
RDW: 13.1 % (ref 11.5–15.5)
WBC: 4.4 10*3/uL (ref 4.0–10.5)

## 2015-06-19 LAB — COMPREHENSIVE METABOLIC PANEL
ALT: 19 U/L (ref 9–46)
AST: 31 U/L (ref 10–40)
Albumin: 3.9 g/dL (ref 3.6–5.1)
Alkaline Phosphatase: 60 U/L (ref 40–115)
BILIRUBIN TOTAL: 1.5 mg/dL — AB (ref 0.2–1.2)
BUN: 8 mg/dL (ref 7–25)
CO2: 24 mmol/L (ref 20–31)
CREATININE: 1.04 mg/dL (ref 0.60–1.35)
Calcium: 8.6 mg/dL (ref 8.6–10.3)
Chloride: 106 mmol/L (ref 98–110)
Glucose, Bld: 78 mg/dL (ref 65–99)
Potassium: 4.1 mmol/L (ref 3.5–5.3)
SODIUM: 141 mmol/L (ref 135–146)
TOTAL PROTEIN: 6.5 g/dL (ref 6.1–8.1)

## 2015-06-19 NOTE — Progress Notes (Signed)
Depression screen [facility] 2/9 04/24/2015 04/24/2015 03/09/2015 01/02/2015 11/21/2014  Decreased Interest 2 0 0 0 0  Down, Depressed, Hopeless 2 0 0 0 0  PHQ - 2 Score 4 0 0 0 0  Altered sleeping 3 - - - -  Tired, decreased energy 2 - - - -  Change in appetite 0 - - - -  Feeling bad or failure about yourself  0 - - - -  Trouble concentrating 0 - - - -  Moving slowly or fidgety/restless 0 - - - -  Suicidal thoughts 0 - - - -  PHQ-9 Score 9 - - - -    GAD 7 : Generalized Anxiety Score 04/24/2015  Nervous, Anxious, on Edge 1  Control/stop worrying 1  Worry too much - different things 1  Trouble relaxing 3  Restless 3  Easily annoyed or irritable 3  Afraid - awful might happen 0  Total GAD 7 Score 12    * Pt states score is based on reason of patient status, per Wonda Olds, CMA

## 2015-06-20 LAB — HIV-1 RNA QUANT-NO REFLEX-BLD
HIV 1 RNA QUANT: 381 {copies}/mL — AB (ref <20)
HIV-1 RNA QUANT, LOG: 2.58 {Log_copies}/mL — AB (ref <1.30)

## 2015-06-20 LAB — T-HELPER CELL (CD4) - (RCID CLINIC ONLY)
CD4 % Helper T Cell: 24 % — ABNORMAL LOW (ref 33–55)
CD4 T Cell Abs: 600 /uL (ref 400–2700)

## 2015-06-20 LAB — RPR TITER

## 2015-06-20 LAB — RPR: RPR Ser Ql: REACTIVE — AB

## 2015-06-21 LAB — FLUORESCENT TREPONEMAL AB(FTA)-IGG-BLD: FLUORESCENT TREPONEMAL ABS: REACTIVE — AB

## 2015-06-25 ENCOUNTER — Telehealth: Payer: Self-pay | Admitting: *Deleted

## 2015-06-25 NOTE — Telephone Encounter (Signed)
Patient called to see if he could be seen 06/27/15 when he comes in for his dental appt as he had surgery in December 2016 in [place] at [place] and is having pain and swelling in his abdomen/surgical site and wants to be seen . Advised him his doctor will not be in clinic that day at all and we do not have any appointments available. Advised him to call his surgeon office to see if they could see him. Also reminded him he as an appt here 07/03/15 at 1045 with Dr Megan Salon. He advised he will call the surgeon office.

## 2015-07-03 ENCOUNTER — Ambulatory Visit: Payer: Self-pay | Admitting: Internal Medicine

## 2015-07-16 ENCOUNTER — Ambulatory Visit: Payer: Self-pay | Admitting: Internal Medicine

## 2015-07-19 ENCOUNTER — Ambulatory Visit: Payer: Self-pay | Admitting: Internal Medicine

## 2015-08-07 ENCOUNTER — Ambulatory Visit: Payer: Self-pay | Admitting: Internal Medicine

## 2015-10-02 ENCOUNTER — Ambulatory Visit: Payer: Self-pay | Admitting: Internal Medicine

## 2015-10-10 ENCOUNTER — Telehealth: Payer: Self-pay | Admitting: *Deleted

## 2015-10-10 NOTE — Telephone Encounter (Addendum)
Patient called stating he missed an appt with Dr. Megan Salon last week and needs to be seen next week regarding his pain at the site of a recent surgery at [facility]. Advised patient to call and speak to the surgeon regarding his pain and he said he just saw them last week. Patient has had four no shows or cancellations since 06/2015 and he was encouraged to keep his next appointment. He is having difficulty with transportation to [facility] and I advised him to speak with Amy at [facility], (they have been able to help him in the past with bus passes). He agreed to call her regarding help with transportation. Scheduled an appt for 11/07/15; he marked this in his calender and said he would be sure to come to see Dr. Megan Salon.

## 2015-10-20 DIAGNOSIS — Z86004 Personal history of in-situ neoplasm of other and unspecified digestive organs: Secondary | ICD-10-CM

## 2015-10-20 HISTORY — DX: Personal history of in-situ neoplasm of other and unspecified digestive organs: Z86.004

## 2015-10-30 ENCOUNTER — Telehealth: Payer: Self-pay | Admitting: *Deleted

## 2015-10-30 ENCOUNTER — Other Ambulatory Visit: Payer: Self-pay | Admitting: Internal Medicine

## 2015-10-30 DIAGNOSIS — A549 Gonococcal infection, unspecified: Secondary | ICD-10-CM | POA: Insufficient documentation

## 2015-10-30 DIAGNOSIS — R369 Urethral discharge, unspecified: Secondary | ICD-10-CM

## 2015-10-30 NOTE — Telephone Encounter (Signed)
He needs a full set of lab work including all site testing for [place] and Chlamydia followed by ceftriaxone 250 mg IM and azithromycin 1 g by mouth. Please encourage him to keep a follow-up visit with me.

## 2015-10-30 NOTE — Telephone Encounter (Signed)
Unable to notify patient that he will receive treatment.  2 Phone calls to his number were disconnected by person answering. Will inform patient of treatment if he comes tomorrow for lab work.

## 2015-10-30 NOTE — Telephone Encounter (Signed)
Patient had unprotected exposure, states he now has pus-like urethral drainage for 2 days.  Patient is scheduled for appointment 7/19 with Dr. Megan Salon, has no showed the last 4 scheduled appointments, was last seen September 2016.  RN advised patient to try the STD clinic at the [facility].  Patient called back, stating the health department is unable to treat him, no available appointments today or tomorrow. Patient would like to come to [facility] for treatment. Per chart review, patient also needs labs for his upcoming appointment.  Lab appointment scheduled for 7/12 (pt unable to come today).  Please advise if he should also be treated based on symptoms.  Landis Gandy, RN

## 2015-10-31 ENCOUNTER — Other Ambulatory Visit (INDEPENDENT_AMBULATORY_CARE_PROVIDER_SITE_OTHER): Payer: Self-pay

## 2015-10-31 ENCOUNTER — Ambulatory Visit (INDEPENDENT_AMBULATORY_CARE_PROVIDER_SITE_OTHER): Payer: Self-pay | Admitting: *Deleted

## 2015-10-31 DIAGNOSIS — Z79899 Other long term (current) drug therapy: Secondary | ICD-10-CM

## 2015-10-31 DIAGNOSIS — Z113 Encounter for screening for infections with a predominantly sexual mode of transmission: Secondary | ICD-10-CM

## 2015-10-31 DIAGNOSIS — R369 Urethral discharge, unspecified: Secondary | ICD-10-CM

## 2015-10-31 DIAGNOSIS — R300 Dysuria: Secondary | ICD-10-CM

## 2015-10-31 DIAGNOSIS — B20 Human immunodeficiency virus [HIV] disease: Secondary | ICD-10-CM

## 2015-10-31 LAB — CBC
HCT: 41.7 % (ref 38.5–50.0)
Hemoglobin: 14.4 g/dL (ref 13.2–17.1)
MCH: 35.3 pg — AB (ref 27.0–33.0)
MCHC: 34.5 g/dL (ref 32.0–36.0)
MCV: 102.2 fL — AB (ref 80.0–100.0)
MPV: 9.6 fL (ref 7.5–12.5)
PLATELETS: 161 10*3/uL (ref 140–400)
RBC: 4.08 MIL/uL — AB (ref 4.20–5.80)
RDW: 12.8 % (ref 11.0–15.0)
WBC: 5.7 10*3/uL (ref 3.8–10.8)

## 2015-10-31 MED ORDER — CEFTRIAXONE SODIUM 1 G IJ SOLR
250.0000 mg | Freq: Once | INTRAMUSCULAR | Status: AC
  Administered 2015-10-31: 250 mg via INTRAMUSCULAR

## 2015-10-31 MED ORDER — AZITHROMYCIN 250 MG PO TABS
1000.0000 mg | ORAL_TABLET | Freq: Once | ORAL | Status: AC
  Administered 2015-10-31: 1000 mg via ORAL

## 2015-10-31 NOTE — Addendum Note (Signed)
Addended by: Dolan Amen D on: 10/31/2015 03:37 PM   Modules accepted: Orders

## 2015-11-01 LAB — CYTOLOGY, (ORAL, ANAL, URETHRAL) ANCILLARY ONLY
CHLAMYDIA, DNA PROBE: NEGATIVE
Chlamydia: NEGATIVE
Neisseria Gonorrhea: NEGATIVE
Neisseria Gonorrhea: POSITIVE — AB

## 2015-11-01 LAB — COMPREHENSIVE METABOLIC PANEL
ALT: 17 U/L (ref 9–46)
AST: 33 U/L (ref 10–40)
Albumin: 4.3 g/dL (ref 3.6–5.1)
Alkaline Phosphatase: 55 U/L (ref 40–115)
BUN: 13 mg/dL (ref 7–25)
CHLORIDE: 106 mmol/L (ref 98–110)
CO2: 24 mmol/L (ref 20–31)
Calcium: 9.2 mg/dL (ref 8.6–10.3)
Creat: 1.17 mg/dL (ref 0.60–1.35)
Glucose, Bld: 159 mg/dL — ABNORMAL HIGH (ref 65–99)
POTASSIUM: 4.2 mmol/L (ref 3.5–5.3)
Sodium: 138 mmol/L (ref 135–146)
TOTAL PROTEIN: 6.9 g/dL (ref 6.1–8.1)
Total Bilirubin: 1.6 mg/dL — ABNORMAL HIGH (ref 0.2–1.2)

## 2015-11-01 LAB — LIPID PANEL
CHOLESTEROL: 148 mg/dL (ref 125–200)
HDL: 78 mg/dL (ref >=40)
LDL Cholesterol: 25 mg/dL (ref <130)
TRIGLYCERIDES: 225 mg/dL — AB (ref <150)
Total CHOL/HDL Ratio: 1.9 Ratio (ref <=5.0)
VLDL: 45 mg/dL — AB (ref <30)

## 2015-11-01 LAB — URINALYSIS, ROUTINE W REFLEX MICROSCOPIC
BILIRUBIN URINE: NEGATIVE
GLUCOSE, UA: NEGATIVE
Ketones, ur: NEGATIVE
Leukocytes, UA: NEGATIVE
Nitrite: NEGATIVE
Protein, ur: NEGATIVE
SPECIFIC GRAVITY, URINE: 1.02 (ref 1.001–1.035)
pH: 6 (ref 5.0–8.0)

## 2015-11-01 LAB — URINE CYTOLOGY ANCILLARY ONLY
Chlamydia: NEGATIVE
Neisseria Gonorrhea: POSITIVE — AB

## 2015-11-01 LAB — URINALYSIS, MICROSCOPIC ONLY
CASTS: NONE SEEN [LPF]
CRYSTALS: NONE SEEN [HPF]
RBC / HPF: NONE SEEN RBC/HPF (ref <=2)
SQUAMOUS EPITHELIAL / LPF: NONE SEEN [HPF] (ref <=5)
WBC, UA: >60 WBC/HPF — AB (ref <=5)
YEAST: NONE SEEN [HPF]

## 2015-11-01 LAB — RPR TITER: RPR Titer: 1:1 {titer}

## 2015-11-01 LAB — T-HELPER CELL (CD4) - (RCID CLINIC ONLY)
CD4 T CELL HELPER: 22 % — AB (ref 33–55)
CD4 T Cell Abs: 670 /uL (ref 400–2700)

## 2015-11-01 LAB — RPR: RPR Ser Ql: REACTIVE — AB

## 2015-11-02 LAB — HIV-1 RNA QUANT-NO REFLEX-BLD: HIV-1 RNA Quant, Log: <1.3 Log copies/mL (ref <1.30)

## 2015-11-02 LAB — FLUORESCENT TREPONEMAL AB(FTA)-IGG-BLD: Fluorescent Treponemal ABS: REACTIVE — AB

## 2015-11-05 ENCOUNTER — Telehealth: Payer: Self-pay | Admitting: *Deleted

## 2015-11-05 DIAGNOSIS — R112 Nausea with vomiting, unspecified: Secondary | ICD-10-CM

## 2015-11-05 MED ORDER — ONDANSETRON HCL 4 MG PO TABS: 4.0000 mg | ORAL_TABLET | Freq: Three times a day (TID) | ORAL | Status: DC | PRN

## 2015-11-05 NOTE — Telephone Encounter (Signed)
Pt called complaining of vomiting this morning after eating.  Believes he has "food poisoning."  It is currently at work and would like to remain there.  RN advised small sips of clear liquids; H2O, cola, Gatorade, tea.  Pt does not have insurance and [facility] does not have any appointments this morning.   Informed pt that Zofran rx was sent to [facility].  He is going to pick up the Zofran.

## 2015-11-07 ENCOUNTER — Ambulatory Visit (INDEPENDENT_AMBULATORY_CARE_PROVIDER_SITE_OTHER): Payer: Self-pay | Admitting: Internal Medicine

## 2015-11-07 ENCOUNTER — Encounter: Payer: Self-pay | Admitting: Internal Medicine

## 2015-11-07 VITALS — BP 123/85 | HR 75 | Temp 97.7°F | Wt 153.0 lb

## 2015-11-07 DIAGNOSIS — K649 Unspecified hemorrhoids: Secondary | ICD-10-CM

## 2015-11-07 DIAGNOSIS — R739 Hyperglycemia, unspecified: Secondary | ICD-10-CM | POA: Insufficient documentation

## 2015-11-07 DIAGNOSIS — C210 Malignant neoplasm of anus, unspecified: Secondary | ICD-10-CM | POA: Insufficient documentation

## 2015-11-07 DIAGNOSIS — D013 Carcinoma in situ of anus and anal canal: Secondary | ICD-10-CM

## 2015-11-07 DIAGNOSIS — A630 Anogenital (venereal) warts: Secondary | ICD-10-CM

## 2015-11-07 DIAGNOSIS — B20 Human immunodeficiency virus [HIV] disease: Secondary | ICD-10-CM

## 2015-11-07 DIAGNOSIS — A549 Gonococcal infection, unspecified: Secondary | ICD-10-CM

## 2015-11-07 DIAGNOSIS — K6282 Dysplasia of anus: Secondary | ICD-10-CM

## 2015-11-07 MED ORDER — DIBUCAINE 1 % EX OINT: TOPICAL_OINTMENT | Freq: Three times a day (TID) | CUTANEOUS | Status: DC | PRN

## 2015-11-07 MED ORDER — GABAPENTIN 400 MG PO CAPS: 400.0000 mg | ORAL_CAPSULE | Freq: Three times a day (TID) | ORAL | Status: DC

## 2015-11-07 MED ORDER — GABAPENTIN 100 MG PO CAPS: 400.0000 mg | ORAL_CAPSULE | Freq: Three times a day (TID) | ORAL | Status: DC

## 2015-11-07 NOTE — Assessment & Plan Note (Signed)
His HIV infection remains under excellent control. I will try to encourage him to except the change to a more modern, safer antiretroviral regimen. He states that he will consider this. He has had multiple STDs over the past few years. I reminded him that he is at risk for superinfection with multidrug resistant HIV and asked him to be very careful about the number of partners he has, who they are and consistent condom use. He will follow-up after lab work in 6 months.

## 2015-11-07 NOTE — Assessment & Plan Note (Signed)
His perirectal pain is multifactorial and probably due to a robust reaction to imiquimod, condyloma and his hemorrhoids. I asked him to back off on the imiquimod use at least until he is seen back at [facility] later this week. He will also use topical anesthetic ointment and have increased his Neurontin to 400 mg 3 times a day.

## 2015-11-07 NOTE — Progress Notes (Signed)
Patient Active Problem List   Diagnosis Date Noted  . Human immunodeficiency virus (HIV) disease ([place]) 05/19/2013    Priority: High  . AIN (anal intraepithelial neoplasia) anal canal 11/07/2015    Priority: Medium  . Hyperglycemia 11/07/2015  . Gonorrhea 10/30/2015  . CAP (community acquired pneumonia) 02/25/2015  . Hemorrhoids 02/25/2015  . Dyslipidemia 05/19/2013  . Cigarette smoker 05/19/2013  . Depression 05/19/2013  . Dental caries 05/19/2013  . Hx of unilateral orchiectomy 05/19/2013  . Latent syphilis 05/06/2013  . Anal warts 05/06/2013  . History of chlamydia 05/06/2013    Patient's Medications  New Prescriptions   DIBUCAINE (NUPERCAINAL) 1 % OINTMENT    Apply topically 3 (three) times daily as needed for pain.   GABAPENTIN (NEURONTIN) 400 MG CAPSULE    Take 1 capsule (400 mg total) by mouth 3 (three) times daily.  Previous Medications   ACETAMINOPHEN (TYLENOL) 500 MG TABLET    Take 1 tablet (500 mg total) by mouth every 6 (six) hours as needed.   ALBUTEROL (PROVENTIL HFA;VENTOLIN HFA) 108 (90 BASE) MCG/ACT INHALER    Inhale 2 puffs into the lungs every 6 (six) hours as needed for wheezing or shortness of breath. Use 2 puffs 3 times daily x 5 days then use as needed.   ASPIRIN-ACETAMINOPHEN-CAFFEINE (GOODY HEADACHE PO)    Take 1 packet by mouth daily as needed (headache).   DEXTROMETHORPHAN-GUAIFENESIN (TUSSIN DM) 10-100 MG/5ML LIQUID    Take 10 mLs by mouth every 6 (six) hours as needed for cough.   EMTRICITABINE-TENOFOVIR (TRUVADA) 200-300 MG TABLET    Take 1 tablet by mouth daily.   IMIQUIMOD (ALDARA) 5 % CREAM    Place 1 application rectally 3 (three) times a week.   LOPINAVIR-RITONAVIR (KALETRA) 200-50 MG TABLET    Take 2 tablets by mouth 2 (two) times daily.   NICOTINE (NICODERM CQ - DOSED IN MG/24 HOURS) 21 MG/24HR PATCH    Place 1 patch (21 mg total) onto the skin daily.   ONDANSETRON (ZOFRAN) 4 MG TABLET    Take 1 tablet (4 mg total) by mouth every 8  (eight) hours as needed for nausea or vomiting.   POLYCARBOPHIL (FIBERCON) 625 MG TABLET    Take 1 tablet (625 mg total) by mouth daily.   PRAMOXINE-HYDROCORTISONE (ANALPRAM-HC) 1-1 % RECTAL CREAM    Place 1 application rectally 2 (two) times daily.   SENNA-DOCUSATE (SENOKOT-S) 8.6-50 MG TABLET    Take 1 tablet by mouth 2 (two) times daily.  Modified Medications   No medications on file  Discontinued Medications   ACETAMINOPHEN-CODEINE (TYLENOL #3) 300-30 MG TABLET    Take 1 tablet by mouth every 8 (eight) hours as needed for moderate pain.   GABAPENTIN (NEURONTIN) 100 MG CAPSULE    Take 300 mg by mouth 3 (three) times daily.   GABAPENTIN (NEURONTIN) 100 MG CAPSULE    Take 400 mg by mouth 3 (three) times daily.   LIDOCAINE (XYLOCAINE) 5 % OINTMENT    Apply 1 application topically 3 (three) times daily as needed for mild pain (anal area).    TRAMADOL (ULTRAM) 50 MG TABLET    Take 1 tablet (50 mg total) by mouth every 6 (six) hours as needed.    Subjective: Jeremiah [name] is seen on a work in basis. He continues to take his old regimen of Truvada and Kaletra and does not want to make a change. He denies missing any doses. He has had multiple  sexual partners since his last visit. He states that they have only had oral sex. He recently developed urethral discharge and was found to have uterine and rectal swabs positive for GC. He was negative for chlamydia. He received ceftriaxone and azithromycin and is feeling better. However, he is still having considerable perirectal pain. 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 He also had high-resolution anoscopy which revealed anal intraepithelial neoplasia grade 1. He recently increased his imiquimod and is been having increased pain. He is taking Neurontin 300 mg 3 times a day without much relief.   Review of Systems: Review of Systems  Constitutional: Negative for fever, chills, weight loss, malaise/fatigue and  diaphoresis.  HENT: Negative for sore throat.   Respiratory: Negative for cough, sputum production and shortness of breath.   Cardiovascular: Negative for chest pain.  Gastrointestinal: Negative for nausea, vomiting, abdominal pain and diarrhea.       Severe perirectal pain  Genitourinary: Negative for dysuria and frequency.       As noted in history of present illness.  Musculoskeletal: Negative for myalgias and joint pain.  Skin: Negative for rash.  Neurological: Negative for dizziness and headaches.  Psychiatric/Behavioral: Negative for depression and substance abuse. The patient is not nervous/anxious.     Past Medical History  Diagnosis Date  . HIV (human immunodeficiency virus infection) ([place])   . Pneumonia 2007  . History of tuberculin skin testing within last year 2014  . Anal warts   . Hemorrhoids 02/25/2015    Social History  Substance Use Topics  . Smoking status: Current Every Day Smoker -- 1.00 packs/day for 24 years    Types: Cigarettes  . Smokeless tobacco: Never Used  . Alcohol Use: 0.0 oz/week    0 Standard drinks or equivalent per week     Comment: occasional    Family History  Problem Relation Age of Onset  . Diabetes Mother   . Hypertension Mother   . Cancer Mother     breast  . Diabetes Father   . Hypertension Father     Allergies  Allergen Reactions  . Nsaids Other (See Comments)    DRUG INTERACTION WITH HIV MEDS  . Sulfa Antibiotics Itching    Objective:  Filed Vitals:   11/07/15 1620  BP: 123/85  Pulse: 75  Temp: 97.7 F (36.5 C)  TempSrc: Oral  Weight: 153 lb (69.4 kg)   Body mass index is 21.35 kg/(m^2).  Physical Exam  Constitutional: He is oriented to person, place, and time.  He is uncomfortable due to pain.  HENT:  Mouth/Throat: No oropharyngeal exudate.  Eyes: Conjunctivae are normal.  Cardiovascular: Normal rate and regular rhythm.   No murmur heard. Pulmonary/Chest: Breath sounds normal.  Abdominal: Soft. He  exhibits no mass. There is no tenderness.  He has erythema and small superficial blisters completely surrounding the anus extending about 2 inches out. He also has perirectal condyloma and what looks like a large hemorrhoid.  Musculoskeletal: Normal range of motion.  Neurological: He is alert and oriented to person, place, and time.  Skin: No rash noted.  Psychiatric: Mood and affect normal.    Lab Results Lab Results  Component Value Date   WBC 5.7 10/31/2015   HGB 14.4 10/31/2015   HCT 41.7 10/31/2015   MCV 102.2* 10/31/2015   PLT 161 10/31/2015    Lab Results  Component Value Date   CREATININE 1.17 10/31/2015   BUN 13 10/31/2015  NA 138 10/31/2015   K 4.2 10/31/2015   CL 106 10/31/2015   CO2 24 10/31/2015    Lab Results  Component Value Date   ALT 17 10/31/2015   AST 33 10/31/2015   ALKPHOS 55 10/31/2015   BILITOT 1.6* 10/31/2015    Lab Results  Component Value Date   CHOL 148 10/31/2015   HDL 78 10/31/2015   LDLCALC 25 10/31/2015   TRIG 225* 10/31/2015   CHOLHDL 1.9 10/31/2015   HIV 1 RNA QUANT (copies/mL)  Date Value  10/31/2015 <20  06/19/2015 381*  02/25/2015 <20   CD4 T CELL ABS (/uL)  Date Value  10/31/2015 670  06/19/2015 600  02/25/2015 600     Problem List Items Addressed This Visit      High   Human immunodeficiency virus (HIV) disease ([place])    His HIV infection remains under excellent control. I will try to encourage him to except the change to a more modern, safer antiretroviral regimen. He states that he will consider this. He has had multiple STDs over the past few years. I reminded him that he is at risk for superinfection with multidrug resistant HIV and asked him to be very careful about the number of partners he has, who they are and consistent condom use. He will follow-up after lab work in 6 months.        Medium   AIN (anal intraepithelial neoplasia) anal canal     Unprioritized   Anal warts    His perirectal pain is  multifactorial and probably due to a robust reaction to imiquimod, condyloma and his hemorrhoids. I asked him to back off on the imiquimod use at least until he is seen back at [facility] later this week. He will also use topical anesthetic ointment and have increased his Neurontin to 400 mg 3 times a day.      Gonorrhea - Primary   Hemorrhoids (Chronic)   Relevant Medications   dibucaine (NUPERCAINAL) 1 % ointment   gabapentin (NEURONTIN) 400 MG capsule   Hyperglycemia        Michel Bickers, MD [facility] [place] [facility] [phone] pager   [phone] cell 11/07/2015, 5:32 PM

## 2016-01-30 ENCOUNTER — Other Ambulatory Visit: Payer: Self-pay | Admitting: Internal Medicine

## 2016-01-30 ENCOUNTER — Telehealth: Payer: Self-pay | Admitting: *Deleted

## 2016-01-30 DIAGNOSIS — K649 Unspecified hemorrhoids: Secondary | ICD-10-CM

## 2016-01-30 NOTE — Telephone Encounter (Signed)
Needing refill for Neurontin rx.  RN reviewed refill history for Neurontin.  [facility] has refills available.  RN checked the patient's ADAP.  ADAP ran out 01/19/16.  RN made the patient an appointment for tomorrow for RW/ADAP and [place] (01/31/16).

## 2016-01-31 ENCOUNTER — Ambulatory Visit: Payer: Self-pay

## 2016-02-08 ENCOUNTER — Ambulatory Visit: Payer: Self-pay

## 2016-02-11 ENCOUNTER — Encounter: Payer: Self-pay | Admitting: Internal Medicine

## 2016-03-10 ENCOUNTER — Ambulatory Visit: Payer: Self-pay | Admitting: *Deleted

## 2016-03-10 DIAGNOSIS — F1910 Other psychoactive substance abuse, uncomplicated: Secondary | ICD-10-CM

## 2016-03-10 NOTE — Progress Notes (Signed)
Rannon showed for an unscheduled appointment today with counselor.  Patient was oriented times four with good affect and dress.  Patient was alert and talkative.  Patient said that he needs to get help for his cocaine problem.  Counselor provided support and encouragement for patient.  Counselor gave patient a referral to [facility] and provided a business card with counselor and [facility] triage appointment times and days.  Counselor recommended that patient go today and not waste anytime getting the help that he needs.  Counselor provided patient with 2 buss passes to assist with his transportation to the referral appointment.   Rolena Infante, MA, LPC [facility]

## 2016-03-18 ENCOUNTER — Other Ambulatory Visit: Payer: Self-pay

## 2016-03-31 ENCOUNTER — Encounter: Payer: Self-pay | Admitting: Internal Medicine

## 2016-03-31 ENCOUNTER — Ambulatory Visit (INDEPENDENT_AMBULATORY_CARE_PROVIDER_SITE_OTHER): Payer: Self-pay | Admitting: Internal Medicine

## 2016-03-31 DIAGNOSIS — A530 Latent syphilis, unspecified as early or late: Secondary | ICD-10-CM

## 2016-03-31 DIAGNOSIS — B20 Human immunodeficiency virus [HIV] disease: Secondary | ICD-10-CM

## 2016-03-31 DIAGNOSIS — Z113 Encounter for screening for infections with a predominantly sexual mode of transmission: Secondary | ICD-10-CM

## 2016-03-31 DIAGNOSIS — G8929 Other chronic pain: Secondary | ICD-10-CM | POA: Insufficient documentation

## 2016-03-31 DIAGNOSIS — F17210 Nicotine dependence, cigarettes, uncomplicated: Secondary | ICD-10-CM

## 2016-03-31 DIAGNOSIS — Z23 Encounter for immunization: Secondary | ICD-10-CM

## 2016-03-31 NOTE — Assessment & Plan Note (Signed)
I encouraged him to consider cigarette cessation. 

## 2016-03-31 NOTE — Assessment & Plan Note (Signed)
His infection has been under excellent control. I talked him again today about modernizing his regimen to a simpler, safer combination. He still states that he prefers his current regimen but he said he will consider this at the next visit. He received his influenza vaccination today.

## 2016-03-31 NOTE — Assessment & Plan Note (Signed)
He has chronic groin and perirectal pain that is multifactorial. He has been treated for anal condyloma, hemorrhoids and he has AIN. He was asking me to manage his pain and I deferred. He has been working with Shawna Orleans, his case manager to try to speed up the referral to the pain clinic. I encouraged him to continue follow-up with his doctors at [facility].

## 2016-03-31 NOTE — Assessment & Plan Note (Signed)
His last RPR was decreasing. He will get a repeat RPR today.

## 2016-03-31 NOTE — Progress Notes (Signed)
Patient Active Problem List   Diagnosis Date Noted  . Human immunodeficiency virus (HIV) disease ([place]) 05/19/2013    Priority: High  . AIN (anal intraepithelial neoplasia) anal canal 11/07/2015    Priority: Medium  . Chronic pain 03/31/2016  . Hyperglycemia 11/07/2015  . Gonorrhea 10/30/2015  . CAP (community acquired pneumonia) 02/25/2015  . Hemorrhoids 02/25/2015  . Dyslipidemia 05/19/2013  . Cigarette smoker 05/19/2013  . Depression 05/19/2013  . Dental caries 05/19/2013  . Hx of unilateral orchiectomy 05/19/2013  . Latent syphilis 05/06/2013  . Anal warts 05/06/2013  . History of chlamydia 05/06/2013    Patient's Medications  New Prescriptions   No medications on file  Previous Medications   ALBUTEROL (PROVENTIL HFA;VENTOLIN HFA) 108 (90 BASE) MCG/ACT INHALER    Inhale 2 puffs into the lungs every 6 (six) hours as needed for wheezing or shortness of breath. Use 2 puffs 3 times daily x 5 days then use as needed.   ASPIRIN-ACETAMINOPHEN-CAFFEINE (GOODY HEADACHE PO)    Take 1 packet by mouth daily as needed (headache).   EMTRICITABINE-TENOFOVIR (TRUVADA) 200-300 MG TABLET    Take 1 tablet by mouth daily.   GABAPENTIN (NEURONTIN) 400 MG CAPSULE    TAKE 1 CAPSULE(400 MG) BY MOUTH THREE TIMES DAILY   IMIQUIMOD (ALDARA) 5 % CREAM    Place 1 application rectally 3 (three) times a week.   LOPINAVIR-RITONAVIR (KALETRA) 200-50 MG TABLET    Take 2 tablets by mouth 2 (two) times daily.   NICOTINE (NICODERM CQ - DOSED IN MG/24 HOURS) 21 MG/24HR PATCH    Place 1 patch (21 mg total) onto the skin daily.   POLYCARBOPHIL (FIBERCON) 625 MG TABLET    Take 1 tablet (625 mg total) by mouth daily.   PRAMOXINE-HYDROCORTISONE (ANALPRAM-HC) 1-1 % RECTAL CREAM    Place 1 application rectally 2 (two) times daily.   SENNA-DOCUSATE (SENOKOT-S) 8.6-50 MG TABLET    Take 1 tablet by mouth 2 (two) times daily.  Modified Medications   No medications on file  Discontinued Medications   ACETAMINOPHEN (TYLENOL) 500 MG TABLET    Take 1 tablet (500 mg total) by mouth every 6 (six) hours as needed.   DEXTROMETHORPHAN-GUAIFENESIN (TUSSIN DM) 10-100 MG/5ML LIQUID    Take 10 mLs by mouth every 6 (six) hours as needed for cough.   DIBUCAINE (NUPERCAINAL) 1 % OINTMENT    Apply topically 3 (three) times daily as needed for pain.   ONDANSETRON (ZOFRAN) 4 MG TABLET    Take 1 tablet (4 mg total) by mouth every 8 (eight) hours as needed for nausea or vomiting.    Subjective: Jeremiah [name] is in for his routine HIV follow-up visit. He has had no problems obtaining or tolerating his Truvada and Kaletra. He does not recall missing any doses since his last visit. His primary problem has been continued groin and perirectal pain. He was seen by his general surgeon at [facility] in October. No indication for further surgery was noted at that time. They were going to make a referral to the pain clinic there but Jeremiah [name] states that he has not heard from the clinic. He was also scheduled a follow-up for anoscopy for his AIN. He does not feel like he is ready to quit smoking cigarettes.   Review of Systems: Review of Systems  Constitutional: Negative for chills, diaphoresis, fever, malaise/fatigue and weight loss.  HENT: Negative for sore throat.   Respiratory: Negative for cough, sputum  production and shortness of breath.   Cardiovascular: Negative for chest pain.  Gastrointestinal: Negative for abdominal pain, diarrhea, heartburn, nausea and vomiting.  Genitourinary: Negative for dysuria and frequency.       Pain is noted in history of present illness.  Musculoskeletal: Negative for joint pain and myalgias.  Skin: Negative for rash.  Neurological: Negative for dizziness and headaches.  Psychiatric/Behavioral: Negative for depression and substance abuse. The patient is not nervous/anxious.     Past Medical History:  Diagnosis Date  . Anal warts   . Hemorrhoids 02/25/2015  . History of tuberculin  skin testing within last year 2014  . HIV (human immunodeficiency virus infection) ([place])   . Pneumonia 2007    Social History  Substance Use Topics  . Smoking status: Current Every Day Smoker    Packs/day: 1.00    Years: 24.00    Types: Cigarettes  . Smokeless tobacco: Never Used  . Alcohol use 0.0 oz/week     Comment: 40 oz a day    Family History  Problem Relation Age of Onset  . Diabetes Mother   . Hypertension Mother   . Cancer Mother     breast  . Diabetes Father   . Hypertension Father     Allergies  Allergen Reactions  . Nsaids Other (See Comments)    DRUG INTERACTION WITH HIV MEDS  . Sulfa Antibiotics Itching    Objective:  Vitals:   03/31/16 1545  BP: 127/82  Pulse: 81  Temp: 99 F (37.2 C)  TempSrc: Oral  Weight: 159 lb (72.1 kg)  Height: 5\' 11"  (1.803 m)   Body mass index is 22.18 kg/m.  Physical Exam  Constitutional: He is oriented to person, place, and time.  He appears somewhat uncomfortable due to pain. He is very talkative and animated today.  HENT:  Mouth/Throat: No oropharyngeal exudate.  Eyes: Conjunctivae are normal.  Cardiovascular: Normal rate and regular rhythm.   No murmur heard. Pulmonary/Chest: Effort normal and breath sounds normal. He has no wheezes. He has no rales.  Abdominal: Soft. He exhibits no mass. There is no tenderness.  Musculoskeletal: Normal range of motion.  Neurological: He is alert and oriented to person, place, and time.  Skin: No rash noted.  Psychiatric: Mood and affect normal.    Lab Results Lab Results  Component Value Date   WBC 5.7 10/31/2015   HGB 14.4 10/31/2015   HCT 41.7 10/31/2015   MCV 102.2 (H) 10/31/2015   PLT 161 10/31/2015    Lab Results  Component Value Date   CREATININE 1.17 10/31/2015   BUN 13 10/31/2015   NA 138 10/31/2015   K 4.2 10/31/2015   CL 106 10/31/2015   CO2 24 10/31/2015    Lab Results  Component Value Date   ALT 17 10/31/2015   AST 33 10/31/2015   ALKPHOS 55  10/31/2015   BILITOT 1.6 (H) 10/31/2015    Lab Results  Component Value Date   CHOL 148 10/31/2015   HDL 78 10/31/2015   LDLCALC 25 10/31/2015   TRIG 225 (H) 10/31/2015   CHOLHDL 1.9 10/31/2015   HIV 1 RNA Quant (copies/mL)  Date Value  10/31/2015 <20  06/19/2015 381 (H)  02/25/2015 <20   CD4 T Cell Abs (/uL)  Date Value  10/31/2015 670  06/19/2015 600  02/25/2015 600     Problem List Items Addressed This Visit      High   Human immunodeficiency virus (HIV) disease ([place])  His infection has been under excellent control. I talked him again today about modernizing his regimen to a simpler, safer combination. He still states that he prefers his current regimen but he said he will consider this at the next visit. He received his influenza vaccination today.      Relevant Orders   T-helper cell (CD4)- ([facility] clinic only)   HIV 1 RNA quant-no reflex-bld     Unprioritized   Chronic pain    He has chronic groin and perirectal pain that is multifactorial. He has been treated for anal condyloma, hemorrhoids and he has AIN. He was asking me to manage his pain and I deferred. He has been working with Shawna Orleans, his case manager to try to speed up the referral to the pain clinic. I encouraged him to continue follow-up with his doctors at [facility].      Cigarette smoker    I encouraged him to consider cigarette cessation.      Latent syphilis    His last RPR was decreasing. He will get a repeat RPR today.      Relevant Orders   RPR        Michel Bickers, MD [facility] [phone] pager   [phone] cell 03/31/2016, 4:11 PM

## 2016-04-01 LAB — RPR

## 2016-04-02 LAB — T-HELPER CELL (CD4) - (RCID CLINIC ONLY)
CD4 % Helper T Cell: 26 % — ABNORMAL LOW (ref 33–55)
CD4 T CELL ABS: 770 /uL (ref 400–2700)

## 2016-04-02 LAB — HIV-1 RNA QUANT-NO REFLEX-BLD: HIV-1 RNA Quant, Log: <1.3 Log copies/mL (ref <1.30)

## 2016-05-24 ENCOUNTER — Other Ambulatory Visit: Payer: Self-pay | Admitting: Internal Medicine

## 2016-05-24 DIAGNOSIS — B20 Human immunodeficiency virus [HIV] disease: Secondary | ICD-10-CM

## 2016-06-26 ENCOUNTER — Other Ambulatory Visit: Payer: Self-pay | Admitting: Internal Medicine

## 2016-06-26 DIAGNOSIS — B20 Human immunodeficiency virus [HIV] disease: Secondary | ICD-10-CM

## 2016-07-25 ENCOUNTER — Other Ambulatory Visit: Payer: Self-pay | Admitting: Internal Medicine

## 2016-07-25 DIAGNOSIS — B20 Human immunodeficiency virus [HIV] disease: Secondary | ICD-10-CM

## 2016-07-25 DIAGNOSIS — K649 Unspecified hemorrhoids: Secondary | ICD-10-CM

## 2016-09-17 ENCOUNTER — Other Ambulatory Visit: Payer: Self-pay

## 2016-09-30 ENCOUNTER — Ambulatory Visit: Payer: Self-pay | Admitting: Internal Medicine

## 2017-01-19 DIAGNOSIS — C20 Malignant neoplasm of rectum: Secondary | ICD-10-CM

## 2017-01-19 HISTORY — DX: Malignant neoplasm of rectum: C20

## 2017-11-27 ENCOUNTER — Encounter: Payer: Self-pay | Admitting: Internal Medicine

## 2017-12-02 ENCOUNTER — Other Ambulatory Visit: Payer: Self-pay

## 2017-12-02 ENCOUNTER — Other Ambulatory Visit: Payer: Self-pay | Admitting: *Deleted

## 2017-12-02 DIAGNOSIS — Z113 Encounter for screening for infections with a predominantly sexual mode of transmission: Secondary | ICD-10-CM

## 2017-12-02 DIAGNOSIS — Z79899 Other long term (current) drug therapy: Secondary | ICD-10-CM

## 2017-12-02 DIAGNOSIS — B20 Human immunodeficiency virus [HIV] disease: Secondary | ICD-10-CM

## 2017-12-03 LAB — URINE CYTOLOGY ANCILLARY ONLY
Chlamydia: NEGATIVE
Neisseria Gonorrhea: NEGATIVE

## 2017-12-03 LAB — T-HELPER CELL (CD4) - (RCID CLINIC ONLY)
CD4 % Helper T Cell: 16 % — ABNORMAL LOW (ref 33–55)
CD4 T CELL ABS: 160 /uL — AB (ref 400–2700)

## 2017-12-04 LAB — COMPLETE METABOLIC PANEL WITH GFR
AG RATIO: 2.2 (calc) (ref 1.0–2.5)
ALBUMIN MSPROF: 4.4 g/dL (ref 3.6–5.1)
ALT: 34 U/L (ref 9–46)
AST: 62 U/L — AB (ref 10–40)
Alkaline phosphatase (APISO): 60 U/L (ref 40–115)
BUN: 11 mg/dL (ref 7–25)
CALCIUM: 9 mg/dL (ref 8.6–10.3)
CO2: 25 mmol/L (ref 20–32)
Chloride: 107 mmol/L (ref 98–110)
Creat: 1.08 mg/dL (ref 0.60–1.35)
GFR, EST AFRICAN AMERICAN: 94 mL/min/{1.73_m2} (ref >=60)
GFR, EST NON AFRICAN AMERICAN: 81 mL/min/{1.73_m2} (ref >=60)
GLOBULIN: 2 g/dL (ref 1.9–3.7)
Glucose, Bld: 84 mg/dL (ref 65–99)
POTASSIUM: 4.1 mmol/L (ref 3.5–5.3)
SODIUM: 139 mmol/L (ref 135–146)
TOTAL PROTEIN: 6.4 g/dL (ref 6.1–8.1)
Total Bilirubin: 1.4 mg/dL — ABNORMAL HIGH (ref 0.2–1.2)

## 2017-12-04 LAB — CBC WITH DIFFERENTIAL/PLATELET
BASOS ABS: 21 {cells}/uL (ref 0–200)
Basophils Relative: 0.5 %
EOS ABS: 88 {cells}/uL (ref 15–500)
EOS PCT: 2.1 %
HCT: 41.4 % (ref 38.5–50.0)
Hemoglobin: 14.3 g/dL (ref 13.2–17.1)
Lymphs Abs: 987 cells/uL (ref 850–3900)
MCH: 35.8 pg — AB (ref 27.0–33.0)
MCHC: 34.5 g/dL (ref 32.0–36.0)
MCV: 103.5 fL — ABNORMAL HIGH (ref 80.0–100.0)
MONOS PCT: 11.6 %
MPV: 10.1 fL (ref 7.5–12.5)
NEUTROS PCT: 62.3 %
Neutro Abs: 2617 cells/uL (ref 1500–7800)
Platelets: 149 10*3/uL (ref 140–400)
RBC: 4 10*6/uL — ABNORMAL LOW (ref 4.20–5.80)
RDW: 12.5 % (ref 11.0–15.0)
Total Lymphocyte: 23.5 %
WBC mixed population: 487 cells/uL (ref 200–950)
WBC: 4.2 10*3/uL (ref 3.8–10.8)

## 2017-12-04 LAB — HIV-1 RNA QUANT-NO REFLEX-BLD
HIV 1 RNA QUANT: NOT DETECTED {copies}/mL
HIV-1 RNA QUANT, LOG: NOT DETECTED {Log_copies}/mL

## 2017-12-04 LAB — RPR TITER: RPR Titer: 1:1 {titer} — ABNORMAL HIGH

## 2017-12-04 LAB — LIPID PANEL
CHOLESTEROL: 161 mg/dL (ref <200)
HDL: 66 mg/dL (ref >40)
LDL CHOLESTEROL (CALC): 77 mg/dL
Non-HDL Cholesterol (Calc): 95 mg/dL (calc) (ref <130)
Total CHOL/HDL Ratio: 2.4 (calc) (ref <5.0)
Triglycerides: 99 mg/dL (ref <150)

## 2017-12-04 LAB — RPR: RPR Ser Ql: REACTIVE — AB

## 2017-12-04 LAB — FLUORESCENT TREPONEMAL AB(FTA)-IGG-BLD: FLUORESCENT TREPONEMAL ABS: REACTIVE — AB

## 2017-12-24 ENCOUNTER — Telehealth: Payer: Self-pay | Admitting: *Deleted

## 2017-12-24 NOTE — Telephone Encounter (Signed)
Patient is transferring care back to [facility], Dr Megan Salon.  Had labs 8/14, was undetectable. His appointment is 9/18, but he has only 3 days of medication left. He is not sure what medication he was taking. He stated he could not call his previous provider. Patient is not at home, cannot confirm his regimen. He will call back with the name of his medications once he is home. His ADAP is approved. Landis Gandy, RN

## 2017-12-25 NOTE — Telephone Encounter (Signed)
Patient states he is now taking Biktarvy and Vitamin D. Please advise if ok to refill. Landis Gandy, RN

## 2017-12-28 ENCOUNTER — Other Ambulatory Visit: Payer: Self-pay | Admitting: *Deleted

## 2017-12-28 DIAGNOSIS — B20 Human immunodeficiency virus [HIV] disease: Secondary | ICD-10-CM

## 2017-12-28 MED ORDER — BICTEGRAVIR-EMTRICITAB-TENOFOV 50-200-25 MG PO TABS: 1.0000 | ORAL_TABLET | Freq: Every day | ORAL | 1 refills | Status: DC

## 2017-12-28 NOTE — Telephone Encounter (Signed)
Yes, he may have refills.

## 2017-12-28 NOTE — Telephone Encounter (Signed)
RN called patient, call went to voicemail. RN notified patient the biktarvy was sent to [place] at [facility]. Unable to confirm dosing for vitamin D, will need that reconciled at his upcoming visit 9/18. Landis Gandy, RN

## 2018-01-06 ENCOUNTER — Encounter: Payer: Self-pay | Admitting: Internal Medicine

## 2018-01-06 ENCOUNTER — Ambulatory Visit (INDEPENDENT_AMBULATORY_CARE_PROVIDER_SITE_OTHER): Payer: Self-pay | Admitting: Internal Medicine

## 2018-01-06 DIAGNOSIS — J8410 Pulmonary fibrosis, unspecified: Secondary | ICD-10-CM

## 2018-01-06 DIAGNOSIS — C20 Malignant neoplasm of rectum: Secondary | ICD-10-CM

## 2018-01-06 DIAGNOSIS — Z23 Encounter for immunization: Secondary | ICD-10-CM

## 2018-01-06 DIAGNOSIS — B20 Human immunodeficiency virus [HIV] disease: Secondary | ICD-10-CM

## 2018-01-06 DIAGNOSIS — F17210 Nicotine dependence, cigarettes, uncomplicated: Secondary | ICD-10-CM

## 2018-01-06 DIAGNOSIS — R911 Solitary pulmonary nodule: Secondary | ICD-10-CM

## 2018-01-06 HISTORY — DX: Pulmonary fibrosis, unspecified: J84.10

## 2018-01-06 MED ORDER — VARENICLINE TARTRATE 0.5 MG X 11 & 1 MG X 42 PO MISC: ORAL | 0 refills | Status: DC

## 2018-01-06 NOTE — Assessment & Plan Note (Signed)
We will try to get his records from the [place] [facility] [place] and refer him to Dr. Leighton Ruff for follow-up and consideration of Port-A-Cath removal.

## 2018-01-06 NOTE — Progress Notes (Signed)
Patient Active Problem List   Diagnosis Date Noted  . Human immunodeficiency virus (HIV) disease ([place]) 05/19/2013    Priority: High  . Rectal cancer ([place]) 11/07/2015    Priority: Medium  . Lung nodule 01/06/2018  . Chronic pain 03/31/2016  . Hyperglycemia 11/07/2015  . Gonorrhea 10/30/2015  . CAP (community acquired pneumonia) 02/25/2015  . Hemorrhoids 02/25/2015  . Dyslipidemia 05/19/2013  . Cigarette smoker 05/19/2013  . Depression 05/19/2013  . Dental caries 05/19/2013  . Hx of unilateral orchiectomy 05/19/2013  . Latent syphilis 05/06/2013  . Anal warts 05/06/2013  . History of chlamydia 05/06/2013    Patient's Medications  New Prescriptions   VARENICLINE (CHANTIX PAK) 0.5 MG X 11 & 1 MG X 42 TABLET    Take one 0.5 mg tablet by mouth once daily for 3 days, then increase to one 0.5 mg tablet twice daily for 4 days, then increase to one 1 mg tablet twice daily.  Previous Medications   BICTEGRAVIR-EMTRICITABINE-TENOFOVIR AF (BIKTARVY) 50-200-25 MG TABS TABLET    Take 1 tablet by mouth daily.   GABAPENTIN (NEURONTIN) 400 MG CAPSULE    TAKE 1 CAPSULE(400 MG) BY MOUTH THREE TIMES DAILY   NICOTINE (NICODERM CQ - DOSED IN MG/24 HOURS) 21 MG/24HR PATCH    Place 1 patch (21 mg total) onto the skin daily.   SENNA-DOCUSATE (SENOKOT-S) 8.6-50 MG TABLET    Take 1 tablet by mouth 2 (two) times daily.  Modified Medications   No medications on file  Discontinued Medications   ALBUTEROL (PROVENTIL HFA;VENTOLIN HFA) 108 (90 BASE) MCG/ACT INHALER    Inhale 2 puffs into the lungs every 6 (six) hours as needed for wheezing or shortness of breath. Use 2 puffs 3 times daily x 5 days then use as needed.   ASPIRIN-ACETAMINOPHEN-CAFFEINE (GOODY HEADACHE PO)    Take 1 packet by mouth daily as needed (headache).   IMIQUIMOD (ALDARA) 5 % CREAM    Place 1 application rectally 3 (three) times a week.   KALETRA 200-50 MG TABLET    TAKE 2 TABLETS BY MOUTH TWICE DAILY   POLYCARBOPHIL (FIBERCON)  625 MG TABLET    Take 1 tablet (625 mg total) by mouth daily.   PRAMOXINE-HYDROCORTISONE (ANALPRAM-HC) 1-1 % RECTAL CREAM    Place 1 application rectally 2 (two) times daily.   TRUVADA 200-300 MG TABLET    TAKE 1 TABLET BY MOUTH DAILY    Subjective: Jeremiah [name] is in for his first visit in 2 years.  He had been living in [place] and receiving care at the [facility] [place].  He did switch to [employer] as we had talked about at the time of his last visit.  He has had no problems obtaining, taking or tolerating it.  He does not recall missing any doses.  While in [place] he was diagnosed with rectal cancer and underwent chemotherapy and radiation therapy.  He completed therapy this January and was told that it appears that his cancer is gone.  He had a follow-up CT scan which did show a small lung nodule.  He was told that he was getting a repeat scan at some point.  He still has his Port-A-Cath in place.  He continues to be bothered by intermittent pain with bowel movements.  He thinks that this is due to his hemorrhoids.  He continues to smoke about a pack of cigarettes daily.  He would like to try to quit but he  does not want to use nicotine patches.  He tried these in the past and did not tolerate them well.  Review of Systems: Review of Systems  Constitutional: Negative for chills, diaphoresis, fever, malaise/fatigue and weight loss.  HENT: Negative for sore throat.   Respiratory: Negative for cough, sputum production and shortness of breath.   Cardiovascular: Negative for chest pain.  Gastrointestinal: Positive for blood in stool. Negative for abdominal pain, diarrhea, heartburn, nausea and vomiting.       Intermittent bright red blood after bowel movements.  He also has some intermittent pain with bowel movements.  Genitourinary: Negative for dysuria and frequency.  Musculoskeletal: Negative for joint pain and myalgias.  Skin: Negative for rash.  Neurological: Negative for  dizziness and headaches.  Psychiatric/Behavioral: Negative for depression and substance abuse. The patient is not nervous/anxious.     Past Medical History:  Diagnosis Date  . Anal warts   . Hemorrhoids 02/25/2015  . History of tuberculin skin testing within last year 2014  . HIV (human immunodeficiency virus infection) ([place])   . Pneumonia 2007    Social History   Tobacco Use  . Smoking status: Current Every Day Smoker    Packs/day: 1.00    Years: 24.00    Pack years: 24.00    Types: Cigarettes  . Smokeless tobacco: Never Used  Substance Use Topics  . Alcohol use: Yes    Alcohol/week: 0.0 standard drinks    Comment: 40 oz a day  . Drug use: No    Comment: Past history of crack cocaine  use     Family History  Problem Relation Age of Onset  . Diabetes Mother   . Hypertension Mother   . Cancer Mother        breast  . Diabetes Father   . Hypertension Father     Allergies  Allergen Reactions  . Nsaids Other (See Comments)    DRUG INTERACTION WITH HIV MEDS  . Sulfa Antibiotics Itching    Health Maintenance  Topic Date Due  . TETANUS/TDAP  10/28/1989  . INFLUENZA VACCINE  11/19/2017  . HIV Screening  Completed    Objective:  Vitals:   01/06/18 0940  BP: 127/82  Pulse: 62  Temp: 97.9 F (36.6 C)  TempSrc: Oral  Weight: 162 lb (73.5 kg)  Height: 5\' 11"  (1.803 m)   Body mass index is 22.59 kg/m.  Physical Exam  Constitutional: He is oriented to person, place, and time.  He is smiling and in good spirits.  HENT:  Mouth/Throat: No oropharyngeal exudate.  Eyes: Conjunctivae are normal.  Cardiovascular: Normal rate and regular rhythm.  No murmur heard. Pulmonary/Chest: Effort normal and breath sounds normal.    Abdominal: Soft. He exhibits no mass. There is no tenderness.  Musculoskeletal: Normal range of motion.  Neurological: He is alert and oriented to person, place, and time.  Skin: No rash noted.    Lab Results Lab Results  Component  Value Date   WBC 4.2 12/02/2017   HGB 14.3 12/02/2017   HCT 41.4 12/02/2017   MCV 103.5 (H) 12/02/2017   PLT 149 12/02/2017    Lab Results  Component Value Date   CREATININE 1.08 12/02/2017   BUN 11 12/02/2017   NA 139 12/02/2017   K 4.1 12/02/2017   CL 107 12/02/2017   CO2 25 12/02/2017    Lab Results  Component Value Date   ALT 34 12/02/2017   AST 62 (H) 12/02/2017   ALKPHOS 55 10/31/2015  BILITOT 1.4 (H) 12/02/2017    Lab Results  Component Value Date   CHOL 161 12/02/2017   HDL 66 12/02/2017   LDLCALC 77 12/02/2017   TRIG 99 12/02/2017   CHOLHDL 2.4 12/02/2017   Lab Results  Component Value Date   LABRPR REACTIVE (A) 12/02/2017   RPRTITER 1:1 (H) 12/02/2017   HIV 1 RNA Quant (copies/mL)  Date Value  12/02/2017 <20 NOT DETECTED  03/31/2016 <20  10/31/2015 <20   CD4 T Cell Abs (/uL)  Date Value  12/02/2017 160 (L)  03/31/2016 770  10/31/2015 670     Problem List Items Addressed This Visit      High   Human immunodeficiency virus (HIV) disease ([place])    He has had long-term viral suppression but his CD4 count last month was down to 160.  This may be a remnant of his chemotherapy.  I will repeat his CD4 today and see her back in several weeks to see if he needs pneumocystis prophylaxis.  He will continue Biktarvy.      Relevant Orders   T-helper cell (CD4)- ([facility] clinic only)     Medium   Rectal cancer [facility])    We will try to get his records from the [facility] and refer him to Dr. Leighton Ruff for follow-up and consideration of Port-A-Cath removal.        Unprioritized   Lung nodule    We will get records from [facility] [place].      Cigarette smoker    I have encouraged him to try to quit smoking.  He would like to try Chantix to help him quit.      Relevant Medications   varenicline (CHANTIX PAK) 0.5 MG X 11 & 1 MG X 42 tablet        Jeremiah Bickers, MD [facility] for [facility]  [place] [phone] pager   [phone] cell 01/06/2018, 10:06 AM

## 2018-01-06 NOTE — Assessment & Plan Note (Signed)
I have encouraged him to try to quit smoking.  He would like to try Chantix to help him quit.

## 2018-01-06 NOTE — Assessment & Plan Note (Signed)
He has had long-term viral suppression but his CD4 count last month was down to 160.  This may be a remnant of his chemotherapy.  I will repeat his CD4 today and see her back in several weeks to see if he needs pneumocystis prophylaxis.  He will continue Biktarvy.

## 2018-01-06 NOTE — Assessment & Plan Note (Signed)
We will get records from [facility].

## 2018-01-07 ENCOUNTER — Telehealth: Payer: Self-pay | Admitting: *Deleted

## 2018-01-07 LAB — T-HELPER CELL (CD4) - (RCID CLINIC ONLY)
CD4 T CELL ABS: 200 /uL — AB (ref 400–2700)
CD4 T CELL HELPER: 19 % — AB (ref 33–55)

## 2018-01-07 NOTE — Telephone Encounter (Signed)
Patient wanted to know if he could have a prescription for vitamin D. Please advise. Landis Gandy, RN

## 2018-01-08 NOTE — Telephone Encounter (Signed)
Please let him know that it is unlikely that he needs supplemental vitamin D.  He can take a single multivitamin daily if he would like.  I will address this with him when he comes back for his follow-up visit in a few weeks.

## 2018-01-11 NOTE — Telephone Encounter (Signed)
Left message for patient. Thank you

## 2018-01-27 ENCOUNTER — Ambulatory Visit: Payer: Self-pay | Admitting: Internal Medicine

## 2018-01-28 ENCOUNTER — Other Ambulatory Visit: Payer: Self-pay | Admitting: Behavioral Health

## 2018-01-28 ENCOUNTER — Encounter: Payer: Self-pay | Admitting: Internal Medicine

## 2018-01-28 ENCOUNTER — Ambulatory Visit (INDEPENDENT_AMBULATORY_CARE_PROVIDER_SITE_OTHER): Payer: Self-pay | Admitting: Internal Medicine

## 2018-01-28 DIAGNOSIS — B20 Human immunodeficiency virus [HIV] disease: Secondary | ICD-10-CM

## 2018-01-28 DIAGNOSIS — C20 Malignant neoplasm of rectum: Secondary | ICD-10-CM

## 2018-01-28 DIAGNOSIS — F17210 Nicotine dependence, cigarettes, uncomplicated: Secondary | ICD-10-CM

## 2018-01-28 DIAGNOSIS — R911 Solitary pulmonary nodule: Secondary | ICD-10-CM

## 2018-01-28 NOTE — Assessment & Plan Note (Signed)
We will check on the referral to general surgery for evaluation of Port-A-Cath removal and routine follow-up of his rectal cancer.  Check on records from his providers in [place].

## 2018-01-28 NOTE — Progress Notes (Signed)
Records release filled out by Mr. Lamour at this visit.  It was faxed to [facility].  Awaiting records.   Pricilla Riffle RN

## 2018-01-28 NOTE — Assessment & Plan Note (Signed)
I encouraged him to consider setting a quit date and starting Chantix

## 2018-01-28 NOTE — Progress Notes (Signed)
Patient Active Problem List   Diagnosis Date Noted  . Human immunodeficiency virus (HIV) disease ([place]) 05/19/2013    Priority: High  . Rectal cancer ([place]) 11/07/2015    Priority: Medium  . Lung nodule 01/06/2018  . Chronic pain 03/31/2016  . Hyperglycemia 11/07/2015  . Gonorrhea 10/30/2015  . CAP (community acquired pneumonia) 02/25/2015  . Hemorrhoids 02/25/2015  . Dyslipidemia 05/19/2013  . Cigarette smoker 05/19/2013  . Depression 05/19/2013  . Dental caries 05/19/2013  . Hx of unilateral orchiectomy 05/19/2013  . Latent syphilis 05/06/2013  . Anal warts 05/06/2013  . History of chlamydia 05/06/2013    Patient's Medications  New Prescriptions   No medications on file  Previous Medications   BICTEGRAVIR-EMTRICITABINE-TENOFOVIR AF (BIKTARVY) 50-200-25 MG TABS TABLET    Take 1 tablet by mouth daily.   GABAPENTIN (NEURONTIN) 400 MG CAPSULE    TAKE 1 CAPSULE(400 MG) BY MOUTH THREE TIMES DAILY   NICOTINE (NICODERM CQ - DOSED IN MG/24 HOURS) 21 MG/24HR PATCH    Place 1 patch (21 mg total) onto the skin daily.   SENNA-DOCUSATE (SENOKOT-S) 8.6-50 MG TABLET    Take 1 tablet by mouth 2 (two) times daily.   VARENICLINE (CHANTIX PAK) 0.5 MG X 11 & 1 MG X 42 TABLET    Take one 0.5 mg tablet by mouth once daily for 3 days, then increase to one 0.5 mg tablet twice daily for 4 days, then increase to one 1 mg tablet twice daily.  Modified Medications   No medications on file  Discontinued Medications   No medications on file    Subjective: Jeremiah [name] is in for his routine follow-up visit.  He has had no problems obtaining, taking or tolerating his Biktarvy and does not miss doses.  He has been feeling a little bit anxious in general but otherwise is doing well.  He continues to smoke cigarettes.  He has not tried to starting Chantix yet.  He has not been contacted about a visit with Dr. Marcello Moores for general surgery follow-up of his recently treated rectal cancer.  He has not had any  problems with his Port-A-Cath.  Review of Systems: Review of Systems  Constitutional: Negative for chills, diaphoresis, fever and weight loss.  HENT: Negative for sore throat.   Respiratory: Negative for cough and sputum production.   Cardiovascular: Negative for chest pain.  Gastrointestinal: Negative for abdominal pain, diarrhea, nausea and vomiting.       Intermittent rectal discomfort.  Genitourinary: Negative for dysuria.  Skin: Negative for rash.  Neurological: Negative for headaches.  Psychiatric/Behavioral: Negative for depression. The patient is nervous/anxious.     Past Medical History:  Diagnosis Date  . Anal warts   . Hemorrhoids 02/25/2015  . History of tuberculin skin testing within last year 2014  . HIV (human immunodeficiency virus infection) ([place])   . Pneumonia 2007    Social History   Tobacco Use  . Smoking status: Current Every Day Smoker    Packs/day: 1.00    Years: 24.00    Pack years: 24.00    Types: Cigarettes  . Smokeless tobacco: Never Used  Substance Use Topics  . Alcohol use: Yes    Alcohol/week: 0.0 standard drinks    Comment: 40 oz every other day  . Drug use: No    Comment: Past history of crack cocaine  use     Family History  Problem Relation Age of Onset  . Diabetes Mother   .  Hypertension Mother   . Cancer Mother        breast  . Diabetes Father   . Hypertension Father     Allergies  Allergen Reactions  . Nsaids Other (See Comments)    DRUG INTERACTION WITH HIV MEDS  . Sulfa Antibiotics Itching    Health Maintenance  Topic Date Due  . TETANUS/TDAP  10/28/1989  . INFLUENZA VACCINE  Completed  . HIV Screening  Completed    Objective:  Vitals:   01/28/18 1040  BP: 126/86  Pulse: (!) 56  Temp: 98.1 F (36.7 C)  TempSrc: Oral  Weight: 156 lb (70.8 kg)  Height: 5\' 11"  (1.803 m)   Body mass index is 21.76 kg/m.  Physical Exam  Constitutional: He is oriented to person, place, and time.  He is smiling and in  good spirits.  HENT:  Mouth/Throat: No oropharyngeal exudate.  Cardiovascular: Normal rate, regular rhythm and normal heart sounds.  Pulmonary/Chest: Effort normal and breath sounds normal.  Abdominal: Soft. There is no tenderness.  Neurological: He is alert and oriented to person, place, and time.  Skin: No rash noted.  Psychiatric: He has a normal mood and affect.    Lab Results Lab Results  Component Value Date   WBC 4.2 12/02/2017   HGB 14.3 12/02/2017   HCT 41.4 12/02/2017   MCV 103.5 (H) 12/02/2017   PLT 149 12/02/2017    Lab Results  Component Value Date   CREATININE 1.08 12/02/2017   BUN 11 12/02/2017   NA 139 12/02/2017   K 4.1 12/02/2017   CL 107 12/02/2017   CO2 25 12/02/2017    Lab Results  Component Value Date   ALT 34 12/02/2017   AST 62 (H) 12/02/2017   ALKPHOS 55 10/31/2015   BILITOT 1.4 (H) 12/02/2017    Lab Results  Component Value Date   CHOL 161 12/02/2017   HDL 66 12/02/2017   LDLCALC 77 12/02/2017   TRIG 99 12/02/2017   CHOLHDL 2.4 12/02/2017   Lab Results  Component Value Date   LABRPR REACTIVE (A) 12/02/2017   RPRTITER 1:1 (H) 12/02/2017   HIV 1 RNA Quant (copies/mL)  Date Value  12/02/2017 <20 NOT DETECTED  03/31/2016 <20  10/31/2015 <20   CD4 T Cell Abs (/uL)  Date Value  01/06/2018 200 (L)  12/02/2017 160 (L)  03/31/2016 770     Problem List Items Addressed This Visit      High   Human immunodeficiency virus (HIV) disease ([place])    His infection remains under very good control and he has had some CD4 reconstitution following completion of chemotherapy for rectal cancer.  He will continue Biktarvy and follow-up after blood work in 3 months.      Relevant Orders   T-helper cell (CD4)- ([facility] clinic only)   HIV-1 RNA quant-no reflex-bld   CBC   Comprehensive metabolic panel   Lipid panel   RPR     Medium   Rectal cancer ([place])    We will check on the referral to general surgery for evaluation of Port-A-Cath removal  and routine follow-up of his rectal cancer.  Check on records from his providers in [place].        Unprioritized   Lung nodule    He indicates that a lung nodule was found during evaluation for his rectal cancer.  I am awaiting records from his providers in [place].      Cigarette smoker    I encouraged him to  consider setting a quit date and starting Chantix           Michel Bickers, MD [facility] for [place] [phone] pager   [phone] cell 01/28/2018, 11:07 AM

## 2018-01-28 NOTE — Assessment & Plan Note (Signed)
He indicates that a lung nodule was found during evaluation for his rectal cancer.  I am awaiting records from his providers in [place].

## 2018-01-28 NOTE — Assessment & Plan Note (Signed)
His infection remains under very good control and he has had some CD4 reconstitution following completion of chemotherapy for rectal cancer.  He will continue Biktarvy and follow-up after blood work in 3 months.

## 2018-02-24 ENCOUNTER — Other Ambulatory Visit: Payer: Self-pay | Admitting: Internal Medicine

## 2018-02-24 DIAGNOSIS — B20 Human immunodeficiency virus [HIV] disease: Secondary | ICD-10-CM

## 2018-03-31 ENCOUNTER — Other Ambulatory Visit: Payer: Self-pay | Admitting: Internal Medicine

## 2018-03-31 DIAGNOSIS — B20 Human immunodeficiency virus [HIV] disease: Secondary | ICD-10-CM

## 2018-04-27 ENCOUNTER — Other Ambulatory Visit: Payer: Self-pay

## 2018-05-04 ENCOUNTER — Other Ambulatory Visit: Payer: Self-pay

## 2018-05-06 ENCOUNTER — Other Ambulatory Visit: Payer: Self-pay | Admitting: Internal Medicine

## 2018-05-06 DIAGNOSIS — B20 Human immunodeficiency virus [HIV] disease: Secondary | ICD-10-CM

## 2018-05-07 ENCOUNTER — Other Ambulatory Visit: Payer: Self-pay

## 2018-05-07 DIAGNOSIS — B20 Human immunodeficiency virus [HIV] disease: Secondary | ICD-10-CM

## 2018-05-07 DIAGNOSIS — Z113 Encounter for screening for infections with a predominantly sexual mode of transmission: Secondary | ICD-10-CM

## 2018-05-07 LAB — T-HELPER CELL (CD4) - (RCID CLINIC ONLY)
CD4 T CELL ABS: 200 /uL — AB (ref 400–2700)
CD4 T CELL HELPER: 21 % — AB (ref 33–55)

## 2018-05-10 LAB — URINE CYTOLOGY ANCILLARY ONLY
Chlamydia: NEGATIVE
Neisseria Gonorrhea: NEGATIVE

## 2018-05-11 ENCOUNTER — Encounter: Payer: Self-pay | Admitting: Internal Medicine

## 2018-05-11 LAB — CBC
HCT: 40.7 % (ref 38.5–50.0)
Hemoglobin: 14.5 g/dL (ref 13.2–17.1)
MCH: 37.8 pg — ABNORMAL HIGH (ref 27.0–33.0)
MCHC: 35.6 g/dL (ref 32.0–36.0)
MCV: 106 fL — ABNORMAL HIGH (ref 80.0–100.0)
MPV: 10 fL (ref 7.5–12.5)
PLATELETS: 144 10*3/uL (ref 140–400)
RBC: 3.84 10*6/uL — ABNORMAL LOW (ref 4.20–5.80)
RDW: 12.5 % (ref 11.0–15.0)
WBC: 3.7 10*3/uL — AB (ref 3.8–10.8)

## 2018-05-11 LAB — LIPID PANEL
CHOL/HDL RATIO: 3 (calc) (ref <5.0)
CHOLESTEROL: 186 mg/dL (ref <200)
HDL: 63 mg/dL (ref >40)
LDL Cholesterol (Calc): 91 mg/dL (calc)
Non-HDL Cholesterol (Calc): 123 mg/dL (calc) (ref <130)
Triglycerides: 234 mg/dL — ABNORMAL HIGH (ref <150)

## 2018-05-11 LAB — COMPREHENSIVE METABOLIC PANEL
AG Ratio: 2.1 (calc) (ref 1.0–2.5)
ALT: 27 U/L (ref 9–46)
AST: 44 U/L — AB (ref 10–40)
Albumin: 4.1 g/dL (ref 3.6–5.1)
Alkaline phosphatase (APISO): 50 U/L (ref 40–115)
BUN: 15 mg/dL (ref 7–25)
CO2: 28 mmol/L (ref 20–32)
Calcium: 9.1 mg/dL (ref 8.6–10.3)
Chloride: 107 mmol/L (ref 98–110)
Creat: 1.06 mg/dL (ref 0.60–1.35)
GLOBULIN: 2 g/dL (ref 1.9–3.7)
GLUCOSE: 66 mg/dL (ref 65–99)
POTASSIUM: 4.1 mmol/L (ref 3.5–5.3)
Sodium: 141 mmol/L (ref 135–146)
Total Bilirubin: 1.5 mg/dL — ABNORMAL HIGH (ref 0.2–1.2)
Total Protein: 6.1 g/dL (ref 6.1–8.1)

## 2018-05-11 LAB — FLUORESCENT TREPONEMAL AB(FTA)-IGG-BLD: FLUORESCENT TREPONEMAL ABS: REACTIVE — AB

## 2018-05-11 LAB — HIV-1 RNA QUANT-NO REFLEX-BLD
HIV 1 RNA Quant: <20 copies/mL
HIV-1 RNA Quant, Log: <1.3 Log copies/mL

## 2018-05-11 LAB — RPR TITER: RPR Titer: 1:1 {titer} — ABNORMAL HIGH

## 2018-05-11 LAB — RPR: RPR: REACTIVE — AB

## 2018-05-18 ENCOUNTER — Encounter: Payer: Self-pay | Admitting: Internal Medicine

## 2018-05-22 ENCOUNTER — Encounter (HOSPITAL_COMMUNITY): Payer: Self-pay | Admitting: *Deleted

## 2018-05-22 ENCOUNTER — Emergency Department (HOSPITAL_COMMUNITY): Payer: Medicaid Other

## 2018-05-22 ENCOUNTER — Emergency Department (HOSPITAL_COMMUNITY)
Admission: EM | Admit: 2018-05-22 | Discharge: 2018-05-22 | Disposition: A | Discharge status: Home / Self Care | Payer: Medicaid Other | Attending: Emergency Medicine | Admitting: Emergency Medicine

## 2018-05-22 DIAGNOSIS — Z79899 Other long term (current) drug therapy: Secondary | ICD-10-CM | POA: Diagnosis not present

## 2018-05-22 DIAGNOSIS — J209 Acute bronchitis, unspecified: Secondary | ICD-10-CM | POA: Diagnosis not present

## 2018-05-22 DIAGNOSIS — J441 Chronic obstructive pulmonary disease with (acute) exacerbation: Secondary | ICD-10-CM | POA: Diagnosis not present

## 2018-05-22 DIAGNOSIS — Z720 Tobacco use: Secondary | ICD-10-CM

## 2018-05-22 DIAGNOSIS — R911 Solitary pulmonary nodule: Secondary | ICD-10-CM

## 2018-05-22 DIAGNOSIS — F17210 Nicotine dependence, cigarettes, uncomplicated: Secondary | ICD-10-CM | POA: Diagnosis not present

## 2018-05-22 DIAGNOSIS — F329 Major depressive disorder, single episode, unspecified: Secondary | ICD-10-CM | POA: Diagnosis not present

## 2018-05-22 DIAGNOSIS — Z85048 Personal history of other malignant neoplasm of rectum, rectosigmoid junction, and anus: Secondary | ICD-10-CM | POA: Diagnosis not present

## 2018-05-22 DIAGNOSIS — R079 Chest pain, unspecified: Secondary | ICD-10-CM | POA: Diagnosis present

## 2018-05-22 DIAGNOSIS — Z21 Asymptomatic human immunodeficiency virus [HIV] infection status: Secondary | ICD-10-CM | POA: Diagnosis not present

## 2018-05-22 LAB — BASIC METABOLIC PANEL
Anion gap: 9 (ref 5–15)
BUN: 8 mg/dL (ref 6–20)
CO2: 27 mmol/L (ref 22–32)
Calcium: 8.9 mg/dL (ref 8.9–10.3)
Chloride: 106 mmol/L (ref 98–111)
Creatinine, Ser: 1.15 mg/dL (ref 0.61–1.24)
GFR calc Af Amer: >60 mL/min (ref >60)
Glucose, Bld: 96 mg/dL (ref 70–99)
Potassium: 3.9 mmol/L (ref 3.5–5.1)
SODIUM: 142 mmol/L (ref 135–145)

## 2018-05-22 LAB — I-STAT TROPONIN, ED: TROPONIN I, POC: 0 ng/mL (ref 0.00–0.08)

## 2018-05-22 LAB — CBC
HCT: 43.7 % (ref 39.0–52.0)
Hemoglobin: 14.6 g/dL (ref 13.0–17.0)
MCH: 35.3 pg — ABNORMAL HIGH (ref 26.0–34.0)
MCHC: 33.4 g/dL (ref 30.0–36.0)
MCV: 105.6 fL — ABNORMAL HIGH (ref 80.0–100.0)
Platelets: 120 10*3/uL — ABNORMAL LOW (ref 150–400)
RBC: 4.14 MIL/uL — ABNORMAL LOW (ref 4.22–5.81)
RDW: 11.9 % (ref 11.5–15.5)
WBC: 3.8 10*3/uL — ABNORMAL LOW (ref 4.0–10.5)
nRBC: 0 % (ref 0.0–0.2)

## 2018-05-22 LAB — D-DIMER, QUANTITATIVE: D-Dimer, Quant: 0.55 ug/mL-FEU — ABNORMAL HIGH (ref 0.00–0.50)

## 2018-05-22 MED ORDER — ALBUTEROL SULFATE HFA 108 (90 BASE) MCG/ACT IN AERS
1.0000 | INHALATION_SPRAY | Freq: Once | RESPIRATORY_TRACT | Status: AC
  Administered 2018-05-22: 2 via RESPIRATORY_TRACT
  Filled 2018-05-22: qty 6.7

## 2018-05-22 MED ORDER — IOPAMIDOL (ISOVUE-370) INJECTION 76%
100.0000 mL | Freq: Once | INTRAVENOUS | Status: AC | PRN
  Administered 2018-05-22: 100 mL via INTRAVENOUS

## 2018-05-22 MED ORDER — IOPAMIDOL (ISOVUE-370) INJECTION 76%
INTRAVENOUS | Status: AC
  Filled 2018-05-22: qty 100

## 2018-05-22 MED ORDER — AEROCHAMBER PLUS FLO-VU MEDIUM MISC
1.0000 | Freq: Once | Status: AC
  Administered 2018-05-22: 1
  Filled 2018-05-22: qty 1

## 2018-05-22 MED ORDER — SODIUM CHLORIDE 0.9% FLUSH
3.0000 mL | Freq: Once | INTRAVENOUS | Status: AC
  Administered 2018-05-22: 3 mL via INTRAVENOUS

## 2018-05-22 MED ORDER — SODIUM CHLORIDE (PF) 0.9 % IJ SOLN
INTRAMUSCULAR | Status: AC
  Filled 2018-05-22: qty 50

## 2018-05-22 MED ORDER — DOXYCYCLINE HYCLATE 100 MG PO CAPS: 100.0000 mg | ORAL_CAPSULE | Freq: Two times a day (BID) | ORAL | 0 refills | Status: DC

## 2018-05-22 MED ORDER — PREDNISONE 10 MG (21) PO TBPK: ORAL_TABLET | ORAL | 0 refills | Status: DC

## 2018-05-22 NOTE — ED Notes (Signed)
Patient ambulated to XR with technician.

## 2018-05-22 NOTE — ED Triage Notes (Addendum)
Pt complains of chest pain, cough, nausea, shortness of breath x 24 hours.

## 2018-05-22 NOTE — Discharge Instructions (Signed)
Stop smoking

## 2018-05-22 NOTE — ED Provider Notes (Signed)
[facility] DEPT Provider Note   CSN: [id number] Arrival date & time: 05/22/18  9678     History   Chief Complaint Chief Complaint  Patient presents with  . Chest Pain    HPI Jeremiah [name] is a 48 y.o. male.  Pt presents to the ED today with cp, cough, sob for 24 hours.  Pt does have a hx of HIV (last CD4 count 200 on 1/17).  He is followed by the [facility].   Pt has a hx of rectal or anal cancer treated with chemo last year in [facility].  He said he was told he had a "spot" on his lung, but then had to move.  He has not seen heme-onc in [place], but has been referred to one per Epic, but pt said he never got the referral.  Pt would like to see one in [place] as opposed to [place] as he lives here.  Pt does smoke about 1ppd.  Pt said he feels like he did when he got pneumonia.       Past Medical History:  Diagnosis Date  . Anal warts   . Hemorrhoids 02/25/2015  . History of tuberculin skin testing within last year 2014  . HIV (human immunodeficiency virus infection) ([place])   . Pneumonia 2007    Patient Active Problem List   Diagnosis Date Noted  . Lung nodule 01/06/2018  . Chronic pain 03/31/2016  . Rectal cancer ([place]) 11/07/2015  . Hyperglycemia 11/07/2015  . Gonorrhea 10/30/2015  . CAP (community acquired pneumonia) 02/25/2015  . Hemorrhoids 02/25/2015  . Human immunodeficiency virus (HIV) disease ([place]) 05/19/2013  . Dyslipidemia 05/19/2013  . Cigarette smoker 05/19/2013  . Depression 05/19/2013  . Dental caries 05/19/2013  . Hx of unilateral orchiectomy 05/19/2013  . Latent syphilis 05/06/2013  . Anal warts 05/06/2013  . History of chlamydia 05/06/2013    Past Surgical History:  Procedure Laterality Date  . HERNIA REPAIR  1991   Abdominal   . unilateral orchiectomy          Home Medications    Prior to Admission medications   Medication Sig Start Date End Date Taking? Authorizing Provider  BIKTARVY 50-200-25 MG TABS tablet TAKE  1 TABLET BY MOUTH DAILY 05/06/18   Michel Bickers, MD  doxycycline (VIBRAMYCIN) 100 MG capsule Take 1 capsule (100 mg total) by mouth 2 (two) times daily. 05/22/18   Isla Pence, MD  gabapentin (NEURONTIN) 400 MG capsule TAKE 1 CAPSULE(400 MG) BY MOUTH THREE TIMES DAILY Patient not taking: Reported on 01/06/2018 07/28/16   Michel Bickers, MD  nicotine (NICODERM CQ - DOSED IN MG/24 HOURS) 21 mg/24hr patch Place 1 patch (21 mg total) onto the skin daily. Patient not taking: Reported on 03/31/2016 02/27/15   Eugenie Filler, MD  predniSONE (STERAPRED UNI-PAK 21 TAB) 10 MG (21) TBPK tablet Take 6 tabs for 2 days, then 5 for 2 days, then 4 for 2 days, then 3 for 2 days, 2 for 2 days, then 1 for 2 days 05/22/18   Isla Pence, MD  senna-docusate (SENOKOT-S) 8.6-50 MG tablet Take 1 tablet by mouth 2 (two) times daily. Patient not taking: Reported on 03/31/2016 02/27/15   Eugenie Filler, MD  varenicline (CHANTIX PAK) 0.5 MG X 11 & 1 MG X 42 tablet Take one 0.5 mg tablet by mouth once daily for 3 days, then increase to one 0.5 mg tablet twice daily for 4 days, then increase to one 1 mg tablet twice  daily. Patient not taking: Reported on 01/28/2018 01/06/18   Michel Bickers, MD    Family History Family History  Problem Relation Age of Onset  . Diabetes Mother   . Hypertension Mother   . Cancer Mother        breast  . Diabetes Father   . Hypertension Father     Social History Social History   Tobacco Use  . Smoking status: Current Every Day Smoker    Packs/day: 1.00    Years: 24.00    Pack years: 24.00    Types: Cigarettes  . Smokeless tobacco: Never Used  Substance Use Topics  . Alcohol use: Yes    Alcohol/week: 0.0 standard drinks    Comment: 40 oz every other day  . Drug use: No    Comment: Past history of crack cocaine  use      Allergies   Nsaids and Sulfa antibiotics   Review of Systems Review of Systems  Respiratory: Positive for cough and shortness of breath.     Cardiovascular: Positive for chest pain.  All other systems reviewed and are negative.    Physical Exam Updated Vital Signs BP 117/80   Pulse 77   Temp 98.6 F (37 C) (Oral)   Resp 16   SpO2 96%   Physical Exam Vitals signs and nursing note reviewed.  Constitutional:      Appearance: He is well-developed.  HENT:     Head: Normocephalic and atraumatic.  Eyes:     Extraocular Movements: Extraocular movements intact.     Pupils: Pupils are equal, round, and reactive to light.  Neck:     Musculoskeletal: Normal range of motion and neck supple.  Cardiovascular:     Rate and Rhythm: Normal rate and regular rhythm.     Heart sounds: Normal heart sounds.  Pulmonary:     Effort: Pulmonary effort is normal.     Breath sounds: Wheezing present.  Abdominal:     General: Bowel sounds are normal.     Palpations: Abdomen is soft.  Musculoskeletal: Normal range of motion.  Skin:    General: Skin is warm and dry.     Capillary Refill: Capillary refill takes less than 2 seconds.  Neurological:     General: No focal deficit present.     Mental Status: He is alert and oriented to person, place, and time.  Psychiatric:        Mood and Affect: Mood normal.        Behavior: Behavior normal.      ED Treatments / Results  Labs (all labs ordered are listed, but only abnormal results are displayed) Labs Reviewed  CBC - Abnormal; Notable for the following components:      Result Value   WBC 3.8 (*)    RBC 4.14 (*)    MCV 105.6 (*)    MCH 35.3 (*)    Platelets 120 (*)    All other components within normal limits  D-DIMER, QUANTITATIVE (NOT AT [facility]) - Abnormal; Notable for the following components:   D-Dimer, Quant 0.55 (*)    All other components within normal limits  BASIC METABOLIC PANEL  I-STAT TROPONIN, ED    EKG EKG Interpretation  Date/Time:  Saturday May 22 2018 08:32:23 EST Ventricular Rate:  90 PR Interval:    QRS Duration: 90 QT Interval:  370 QTC  Calculation: 453 R Axis:   79 Text Interpretation:  Sinus rhythm Borderline short PR interval Right atrial enlargement Borderline T  wave abnormalities No significant change since last tracing Confirmed by Isla Pence [phone]) on 05/22/2018 8:36:11 AM   Radiology Dg Chest 2 View  Result Date: 05/22/2018 CLINICAL DATA:  Chest pain, cough and nausea for 24 hours. EXAM: CHEST - 2 VIEW COMPARISON:  PA and lateral chest 06/17/2015.  CT chest 02/24/2017. FINDINGS: Port-A-Cath is in place with the tip in the lower superior vena cava. The lungs are emphysematous but clear. Heart size is normal. No pneumothorax or pleural effusion. No acute or focal bony abnormality. IMPRESSION: Emphysema without acute disease. Electronically Signed   By: Inge Rise M.D.   On: 05/22/2018 08:59   Ct Angio Chest Pe W And/or Wo Contrast  Result Date: 05/22/2018 CLINICAL DATA:  Chest pain, nausea. EXAM: CT ANGIOGRAPHY CHEST CT ABDOMEN AND PELVIS WITH CONTRAST TECHNIQUE: Multidetector CT imaging of the chest was performed using the standard protocol during bolus administration of intravenous contrast. Multiplanar CT image reconstructions and MIPs were obtained to evaluate the vascular anatomy. Multidetector CT imaging of the abdomen and pelvis was performed using the standard protocol during bolus administration of intravenous contrast. CONTRAST:  189mL ISOVUE-370 IOPAMIDOL (ISOVUE-370) INJECTION 76% COMPARISON:  CT scan of February 18, 2004. FINDINGS: CTA CHEST FINDINGS Cardiovascular: Satisfactory opacification of the pulmonary arteries to the segmental level. No evidence of pulmonary embolism. Normal heart size. No pericardial effusion. Mediastinum/Nodes: Thyroid gland and esophagus are unremarkable. 2.1 cm right hilar lymph node is noted. Lungs/Pleura: No pneumothorax or pleural effusion is noted. Emphysematous disease is noted in both upper lobes, right greater than left. 1.7 x 1.1 cm nodule is noted in right upper lobe best  seen on image number 60 of series 15. This is concerning for malignancy or metastatic disease. Musculoskeletal: No chest wall abnormality. No acute or significant osseous findings. Review of the MIP images confirms the above findings. CT ABDOMEN and PELVIS FINDINGS Hepatobiliary: No focal liver abnormality is seen. No gallstones, gallbladder wall thickening, or biliary dilatation. Pancreas: Unremarkable. No pancreatic ductal dilatation or surrounding inflammatory changes. Spleen: Normal in size without focal abnormality. Adrenals/Urinary Tract: Left renal cyst is noted. No hydronephrosis or renal obstruction is noted. No renal or ureteral calculi are noted. Urinary bladder is unremarkable. Adrenal glands and right kidney appear normal. Stomach/Bowel: Stomach is within normal limits. Appendix appears normal. No evidence of bowel wall thickening, distention, or inflammatory changes. Vascular/Lymphatic: No significant vascular findings are present. No enlarged abdominal or pelvic lymph nodes. Reproductive: Prostate is unremarkable. Other: No abdominal wall hernia or abnormality. No abdominopelvic ascites. Musculoskeletal: No acute or significant osseous findings. Review of the MIP images confirms the above findings. IMPRESSION: 1.7 cm right upper lobe nodule is noted concerning for malignancy or metastatic disease. Also noted is 2.1 cm right hilar lymph node concerning for metastatic disease. PET scan is recommended for further evaluation. No definite evidence of pulmonary embolus. No significant abnormality seen in the abdomen or pelvis. Emphysema (ICD10-J43.9). Electronically Signed   By: Marijo Conception, M.D.   On: 05/22/2018 10:10   Ct Abdomen Pelvis W Contrast  Result Date: 05/22/2018 CLINICAL DATA:  Chest pain, nausea. EXAM: CT ANGIOGRAPHY CHEST CT ABDOMEN AND PELVIS WITH CONTRAST TECHNIQUE: Multidetector CT imaging of the chest was performed using the standard protocol during bolus administration of  intravenous contrast. Multiplanar CT image reconstructions and MIPs were obtained to evaluate the vascular anatomy. Multidetector CT imaging of the abdomen and pelvis was performed using the standard protocol during bolus administration of intravenous contrast. CONTRAST:  13mL ISOVUE-370  IOPAMIDOL (ISOVUE-370) INJECTION 76% COMPARISON:  CT scan of February 18, 2004. FINDINGS: CTA CHEST FINDINGS Cardiovascular: Satisfactory opacification of the pulmonary arteries to the segmental level. No evidence of pulmonary embolism. Normal heart size. No pericardial effusion. Mediastinum/Nodes: Thyroid gland and esophagus are unremarkable. 2.1 cm right hilar lymph node is noted. Lungs/Pleura: No pneumothorax or pleural effusion is noted. Emphysematous disease is noted in both upper lobes, right greater than left. 1.7 x 1.1 cm nodule is noted in right upper lobe best seen on image number 60 of series 15. This is concerning for malignancy or metastatic disease. Musculoskeletal: No chest wall abnormality. No acute or significant osseous findings. Review of the MIP images confirms the above findings. CT ABDOMEN and PELVIS FINDINGS Hepatobiliary: No focal liver abnormality is seen. No gallstones, gallbladder wall thickening, or biliary dilatation. Pancreas: Unremarkable. No pancreatic ductal dilatation or surrounding inflammatory changes. Spleen: Normal in size without focal abnormality. Adrenals/Urinary Tract: Left renal cyst is noted. No hydronephrosis or renal obstruction is noted. No renal or ureteral calculi are noted. Urinary bladder is unremarkable. Adrenal glands and right kidney appear normal. Stomach/Bowel: Stomach is within normal limits. Appendix appears normal. No evidence of bowel wall thickening, distention, or inflammatory changes. Vascular/Lymphatic: No significant vascular findings are present. No enlarged abdominal or pelvic lymph nodes. Reproductive: Prostate is unremarkable. Other: No abdominal wall hernia or  abnormality. No abdominopelvic ascites. Musculoskeletal: No acute or significant osseous findings. Review of the MIP images confirms the above findings. IMPRESSION: 1.7 cm right upper lobe nodule is noted concerning for malignancy or metastatic disease. Also noted is 2.1 cm right hilar lymph node concerning for metastatic disease. PET scan is recommended for further evaluation. No definite evidence of pulmonary embolus. No significant abnormality seen in the abdomen or pelvis. Emphysema (ICD10-J43.9). Electronically Signed   By: Marijo Conception, M.D.   On: 05/22/2018 10:10    Procedures Procedures (including critical care time)  Medications Ordered in ED Medications  sodium chloride (PF) 0.9 % injection (has no administration in time range)  iopamidol (ISOVUE-370) 76 % injection (has no administration in time range)  sodium chloride flush (NS) 0.9 % injection 3 mL (3 mLs Intravenous Given 05/22/18 0923)  albuterol (PROVENTIL HFA;VENTOLIN HFA) 108 (90 Base) MCG/ACT inhaler 1-2 puff (2 puffs Inhalation Given 05/22/18 0923)  AEROCHAMBER PLUS FLO-VU MEDIUM MISC 1 each (1 each Other Given 05/22/18 0923)  iopamidol (ISOVUE-370) 76 % injection 100 mL (100 mLs Intravenous Contrast Given 05/22/18 0932)     Initial Impression / Assessment and Plan / ED Course  I have reviewed the triage vital signs and the nursing notes.  Pertinent labs & imaging results that were available during my care of the patient were reviewed by me and considered in my medical decision making (see chart for details).    Pt has never been formally diagnosed with COPD.  He is instructed to stop smoking.  He is given an albuterol inhaler with spacer in ED.  He will be d/c home with prednisone.    Pt also has HIV with a CD4 count of 200.  He does not have pneumonia, but I will treat him for bronchitis so it does not turn into pneumonia.  Pt has a suspicious nodule on chest CT which is concerning for malignancy.  Pt given a referral to  oncology.    Pt knows to return if worse and to f/u with PCP, ID, oncology.  Final Clinical Impressions(s) / ED Diagnoses   Final diagnoses:  Chronic  obstructive pulmonary disease with acute exacerbation (HCC)  Acute bronchitis, unspecified organism  Asymptomatic HIV infection ([place])  Nodule of upper lobe of right lung  Tobacco abuse    ED Discharge Orders         Ordered    Ambulatory referral to Oncology     05/22/18 1026    predniSONE (STERAPRED UNI-PAK 21 TAB) 10 MG (21) TBPK tablet     05/22/18 1032    doxycycline (VIBRAMYCIN) 100 MG capsule  2 times daily     05/22/18 1032           Isla Pence, MD 05/22/18 1035

## 2018-05-22 NOTE — ED Notes (Signed)
ED Provider at bedside. 

## 2018-05-26 ENCOUNTER — Telehealth: Payer: Self-pay | Admitting: Hematology

## 2018-05-26 NOTE — Progress Notes (Signed)
  Oncology Nurse Navigator Documentation     Called patient to confirm appointment on 05/27/18 @ 8 AM. Patient understands that he will need to arrive 15 minutes to register. Direction provided to [facility]. Will need patient to sign ROI to obtain records from [facility] in [place].

## 2018-05-26 NOTE — Telephone Encounter (Signed)
A new patoient appt has been scheduled for the pt to see Dr. Burr Medico on 2/6 at [facility]. I was unable to reach the pt, but left the appt date and time on the pt's vm. I spoke to [facility], who will try calling the pt with the appt as well.

## 2018-05-27 ENCOUNTER — Telehealth: Payer: Self-pay

## 2018-05-27 ENCOUNTER — Inpatient Hospital Stay: Payer: Medicaid Other | Admitting: General Practice

## 2018-05-27 ENCOUNTER — Encounter: Payer: Self-pay | Admitting: Hematology

## 2018-05-27 ENCOUNTER — Encounter: Payer: Self-pay | Admitting: General Practice

## 2018-05-27 ENCOUNTER — Inpatient Hospital Stay: Payer: Medicaid Other | Attending: Hematology | Admitting: Hematology

## 2018-05-27 VITALS — BP 128/85 | HR 73 | Temp 98.6°F | Resp 18 | Ht 71.0 in | Wt 155.2 lb

## 2018-05-27 DIAGNOSIS — C20 Malignant neoplasm of rectum: Secondary | ICD-10-CM

## 2018-05-27 DIAGNOSIS — Z85048 Personal history of other malignant neoplasm of rectum, rectosigmoid junction, and anus: Secondary | ICD-10-CM

## 2018-05-27 DIAGNOSIS — F17210 Nicotine dependence, cigarettes, uncomplicated: Secondary | ICD-10-CM | POA: Diagnosis not present

## 2018-05-27 DIAGNOSIS — R918 Other nonspecific abnormal finding of lung field: Secondary | ICD-10-CM | POA: Diagnosis present

## 2018-05-27 DIAGNOSIS — Z79899 Other long term (current) drug therapy: Secondary | ICD-10-CM

## 2018-05-27 DIAGNOSIS — Z9221 Personal history of antineoplastic chemotherapy: Secondary | ICD-10-CM

## 2018-05-27 DIAGNOSIS — B20 Human immunodeficiency virus [HIV] disease: Secondary | ICD-10-CM

## 2018-05-27 DIAGNOSIS — R079 Chest pain, unspecified: Secondary | ICD-10-CM | POA: Diagnosis not present

## 2018-05-27 DIAGNOSIS — J449 Chronic obstructive pulmonary disease, unspecified: Secondary | ICD-10-CM | POA: Diagnosis not present

## 2018-05-27 DIAGNOSIS — Z923 Personal history of irradiation: Secondary | ICD-10-CM | POA: Diagnosis not present

## 2018-05-27 DIAGNOSIS — R911 Solitary pulmonary nodule: Secondary | ICD-10-CM

## 2018-05-27 NOTE — Progress Notes (Signed)
[place] CSW Progress Notes  Patient referred by nurse navigator for help w obtaining transportation to [facility] appointments.  Enrolled in [employer], waiver signed, referred to Lauralee Evener for future rides to/from [facility] appointments.  Per patient, he has case Regulatory affairs officer at [employer], [place] will contact this individual w patient's permission to discuss patient needs.  Patient advised to apply for Medicaid at [place].  When treatment plan is established, patient can apply for [place] as well as be referred to [facility] for help w disability application.  CSW will schedule appointment for assessment at next oncologist visit if possible.    Edwyna Shell, LCSW Clinical Social Worker Phone:  [phone]

## 2018-05-27 NOTE — Progress Notes (Signed)
[place] CSW Progress Notes  Per [facility], patient has a Tourist information centre manager at [place] [phone]).  Call placed to this individual to explore options for helping patient w [facility] and other needs.  Edwyna Shell, LCSW Clinical Social Worker Phone:  [phone]

## 2018-05-27 NOTE — Telephone Encounter (Signed)
Called and left a message for the patient to call back to get scheduled.

## 2018-05-27 NOTE — Telephone Encounter (Signed)
Printed avs and calender of upcoming appointment. Per 2/6 los 

## 2018-05-27 NOTE — Progress Notes (Signed)
[place]   Telephone:([phone]) [phone] Fax:([phone]) [place] Note   Patient Care Team: Inc, [facility] as PCP - General Michel Bickers, MD as PCP - Infectious Diseases (Infectious Diseases) Charlott Rakes, MD as Consulting Physician (Family Medicine) 05/27/2018  Referring Provider: ED    CHIEF COMPLAINTS/PURPOSE OF CONSULTATION:  History of anal cancer. Concern for metastatic disease.  HISTORY OF PRESENTING ILLNESS:  Jeremiah [name] 48 y.o. male is here because of concerns of pulmonary metastasis from his anal cancer.  He was referred by ED physician after his recent visit.  He presents to the clinic by himself today.   He presented with rectal pain for a year in 2018, and was diagnosed in November 2018.  He with chemotherapy and radiation last year in [place], [place], and completed treatment in January 2019.  During the follow-up in March 2019, he was told that a nodule was found on his lung and follow-up was recommended.  That was his last visit with his oncologist in [place], and that he subsequently moved back to [facility].  He has not seen any heme-onc in [place].   He went to the ED on 05/22/2018 for cp,cough and sob and was treated for bronchitis. He had a chest CT and a suspirious nodule was found in the upper lobe of right lung. He was referred him to oncology after ED discharge.  Today, he is here alone. He used to live in [place] and moved to [facility] two years ago to be with family. He was diagnosed 01/2017 for anal cancer in [facility]. Before his diagnosis he described sharp pain and bleeding. He has never had surgery for his cancer. He has a port and  was on chemotherapy for anal cancer from nov to dec 2018. He tolerated chemotherapy well. In March 2019 he had a PET scan for his anal cancer f/u and they found a nodule. He did not receive any treatment then in [facility], they just put him on surveillance. Pt has a medical history  of COPD and HIV and denies any other medical problem. For his HIV he has not had any severe symptoms except pneumonia. Pt does smoke about 1ppd. He had a hernia surgery, and had a UTI and had a surgery for it. He lives in an apartment alone and sometimes struggle to pay bills. He does not have any insurance.  Today he is feeling better after his ED visit. He still has mild cp when he coughs.     Pertinent positives and negatives of review of systems are listed and detailed within the above HPI.  MEDICAL HISTORY:  Past Medical History:  Diagnosis Date  . Anal warts   . COPD (chronic obstructive pulmonary disease) ([place])   . Hemorrhoids 02/25/2015  . History of tuberculin skin testing within last year 2014  . HIV (human immunodeficiency virus infection) ([place])   . Pneumonia 2007    SURGICAL HISTORY: Past Surgical History:  Procedure Laterality Date  . HERNIA REPAIR  1991   Abdominal   . unilateral orchiectomy      SOCIAL HISTORY: Social History   Socioeconomic History  . Marital status: Single    Spouse name: Not on file  . Number of children: Not on file  . Years of education: Not on file  . Highest education level: Not on file  Occupational History  . Not on file  Social Needs  . Financial resource strain: Not on file  .  Food insecurity:    Worry: Not on file    Inability: Not on file  . Transportation needs:    Medical: Not on file    Non-medical: Not on file  Tobacco Use  . Smoking status: Current Every Day Smoker    Packs/day: 1.00    Years: 24.00    Pack years: 24.00    Types: Cigarettes  . Smokeless tobacco: Never Used  Substance and Sexual Activity  . Alcohol use: Yes    Alcohol/week: 0.0 standard drinks    Comment: 40 oz every other day  . Drug use: No    Comment: Past history of crack cocaine  use   . Sexual activity: Yes    Partners: Male    Birth control/protection: Condom    Comment: given condoms  Lifestyle  . Physical activity:    Days per  week: Not on file    Minutes per session: Not on file  . Stress: Not on file  Relationships  . Social connections:    Talks on phone: Not on file    Gets together: Not on file    Attends religious service: Not on file    Active member of club or organization: Not on file    Attends meetings of clubs or organizations: Not on file    Relationship status: Not on file  . Intimate partner violence:    Fear of current or ex partner: Not on file    Emotionally abused: Not on file    Physically abused: Not on file    Forced sexual activity: Not on file  Other Topics Concern  . Not on file  Social History Narrative  . Not on file    FAMILY HISTORY: Family History  Problem Relation Age of Onset  . Diabetes Mother   . Hypertension Mother   . Cancer Mother        breast  . Diabetes Father   . Hypertension Father     ALLERGIES:  is allergic to nsaids and sulfa antibiotics.  MEDICATIONS:  Current Outpatient Medications  Medication Sig Dispense Refill  . BIKTARVY 50-200-25 MG TABS tablet TAKE 1 TABLET BY MOUTH DAILY 30 tablet 0  . doxycycline (VIBRAMYCIN) 100 MG capsule Take 1 capsule (100 mg total) by mouth 2 (two) times daily. 14 capsule 0  . predniSONE (STERAPRED UNI-PAK 21 TAB) 10 MG (21) TBPK tablet Take 6 tabs for 2 days, then 5 for 2 days, then 4 for 2 days, then 3 for 2 days, 2 for 2 days, then 1 for 2 days 42 tablet 0   No current facility-administered medications for this visit.     REVIEW OF SYSTEMS:   Constitutional: Denies fevers, chills or abnormal night sweats Eyes: Denies blurriness of vision, double vision or watery eyes Ears, nose, mouth, throat, and face: Denies mucositis or sore throat Respiratory: Denies dyspnea or wheezes, (+) cp when coughing Cardiovascular: Denies palpitation, chest discomfort or lower extremity swelling Gastrointestinal:  Denies nausea, heartburn or change in bowel habits Skin: Denies abnormal skin rashes Lymphatics: Denies new  lymphadenopathy or easy bruising Neurological:Denies numbness, tingling or new weaknesses Behavioral/Psych: Mood is stable, no new changes  All other systems were reviewed with the patient and are negative.  PHYSICAL EXAMINATION: ECOG PERFORMANCE STATUS: 1 - Symptomatic but completely ambulatory  Vitals:   05/27/18 0833  BP: 128/85  Pulse: 73  Resp: 18  Temp: 98.6 F (37 C)  SpO2: 97%   Filed Weights   05/27/18 2831  Weight: 155 lb 3.2 oz (70.4 kg)    GENERAL:alert, no distress and comfortable SKIN: skin color, texture, turgor are normal, no rashes or significant lesions EYES: normal, conjunctiva are pink and non-injected, sclera clear OROPHARYNX:no exudate, no erythema and lips, buccal mucosa, and tongue normal  NECK: supple, thyroid normal size, non-tender, without nodularity LYMPH:  no palpable lymphadenopathy in the cervical, axillary or inguinal LUNGS: clear to auscultation and percussion with normal breathing effort HEART: regular rate & rhythm and no murmurs and no lower extremity edema ABDOMEN:abdomen soft, non-tender and normal bowel sounds Musculoskeletal:no cyanosis of digits and no clubbing  PSYCH: alert & oriented x 3 with fluent speech NEURO: no focal motor/sensory deficits RECTAL: negative, no palpable mass or perianal lesions  LABORATORY DATA:  I have reviewed the data as listed CBC Latest Ref Rng & Units 05/22/2018 05/07/2018 12/02/2017  WBC 4.0 - 10.5 K/uL 3.8(L) 3.7(L) 4.2  Hemoglobin 13.0 - 17.0 g/dL 14.6 14.5 14.3  Hematocrit 39.0 - 52.0 % 43.7 40.7 41.4  Platelets 150 - 400 K/uL 120(L) 144 149   CMP Latest Ref Rng & Units 05/22/2018 05/07/2018 12/02/2017  Glucose 70 - 99 mg/dL 96 66 84  BUN 6 - 20 mg/dL 8 15 11   Creatinine 0.61 - 1.24 mg/dL 1.15 1.06 1.08  Sodium 135 - 145 mmol/L 142 141 139  Potassium 3.5 - 5.1 mmol/L 3.9 4.1 4.1  Chloride 98 - 111 mmol/L 106 107 107  CO2 22 - 32 mmol/L 27 28 25   Calcium 8.9 - 10.3 mg/dL 8.9 9.1 9.0  Total Protein  6.1 - 8.1 g/dL - 6.1 6.4  Total Bilirubin 0.2 - 1.2 mg/dL - 1.5(H) 1.4(H)  Alkaline Phos 40 - 115 U/L - - -  AST 10 - 40 U/L - 44(H) 62(H)  ALT 9 - 46 U/L - 27 34    RADIOGRAPHIC STUDIES: I have personally reviewed the radiological images as listed and agreed with the findings in the report.   05/22/2018 CT Angio Chest PE W and/or Wo Contrast /CT ABDOMEN PELVIS W CONTRAST  IMPRESSION: 1.7 cm right upper lobe nodule is noted concerning for malignancy or metastatic disease. Also noted is 2.1 cm right hilar lymph node concerning for metastatic disease. PET scan is recommended for further evaluation.  No definite evidence of pulmonary embolus.  No significant abnormality seen in the abdomen or pelvis.  Emphysema (ICD10-J43.9).   05/22/2018 DG Chest 2 View  IMPRESSION: Emphysema without acute disease.   ASSESSMENT & PLAN:  Jeremiah [name] is a 48 y.o. male with history of anal cancer, treated with concurrent chemoradiation.  He presented with a right lung nodule and a right hilar adenopathy.  1.  Right upper lobe lung nodule, along with right hilar adenopathy, highly suspicious for malignancy -I reviewed his recent CT chest, abdomen and pelvis from his ED visit on May 22, 2018, which showed a 1.7 cm nodule in the right upper lobe, and a 2 cm lymph node in the right hilar, highly suspicious for malignancy, likely metastatic from his previous anal cancer, also primary lung cancer is certainly possible, given his heavy smoking history.  Patient was told to have a right lung nodule in March 2019 during his follow-up for anal cancer, but he lost f/u after that.  -I personally also reviewed his previous CT scan from November 2016, which showed no lung nodule or adenopathy. -I recommend a PET scan for further evaluation -I will refer him to pulmonary clinic, for EBUS and biopsy of his  right hilar adenopathy.  Due to his extensive emphysema, the right lung nodule will be difficult  to biopsy. -Further treatment plan will be determined based on the biopsy results.  2.  History of anal cancer  -diagnosed in November 2018, and was treated with concurrent chemoradiation in [place].  We will get his outside medical records -CT of abdomen and pelvis was negative for recurrence, rectal exam was also negative today.  3. HIV -Controlled, he is on Biktarvy, f/u with ID Dr. Eveline Keto   4.  Social issues -He has limited income, no insurance  -will refer to our SW, and Engineer, agricultural  - I will order a PET scan  - I will refer him to see pulmonary for EBUS and biopsy  - F/u in 3 weeks and labs    Orders Placed This Encounter  Procedures  . NM PET Image Initial (PI) Skull Base To Thigh    History of anal cancer treated in Jan 2019, highly suspicious for metastatic cancer    Standing Status:   Future    Standing Expiration Date:   05/27/2019    Order Specific Question:   If indicated for the ordered procedure, I authorize the administration of a radiopharmaceutical per Radiology protocol    Answer:   Yes    Order Specific Question:   Preferred imaging location?    Answer:   [facility]    Order Specific Question:   Radiology Contrast Protocol - do NOT remove file path    Answer:   [email or web address]  . Ambulatory referral to Pulmonology    Referral Priority:   Routine    Referral Type:   Consultation    Referral Reason:   Specialty Services Required    Requested Specialty:   Pulmonary Disease    Number of Visits Requested:   1    All questions were answered. The patient knows to call the clinic with any problems, questions or concerns. I spent 35 minutes counseling the patient face to face. The total time spent in the appointment was 50 minutes and more than 50% was on counseling.  I, Manson Allan am acting as scribe for Dr. Truitt Merle.  I have reviewed the above documentation for accuracy and completeness, and I  agree with the above.      Truitt Merle, MD 05/27/2018 10:30 AM

## 2018-05-27 NOTE — Telephone Encounter (Signed)
  Oncology Nurse Navigator Documentation   Met with patient and LCSW to enroll patient into the transportation program and to assess barriers to care.  Patient lives alone but has a brother in [place] for support. Contact information on [facility] resources and support group provided. Patient has contact information for treatment members. I provided my direct phone number for questions or concerns.    10:50 AM  :Called patient to advise of PET scheduled for 06/03/18. Patient to arrive by 12:30 to radiology department for a 1 PM appointment. Nothing to eat or drink 6 hours prior to scan. Patient voiced understanding.

## 2018-05-27 NOTE — Telephone Encounter (Signed)
-----   Message from Rigoberto Noel, MD sent at 05/27/2018 11:09 AM EST ----- Sure. Jordanny Waddington, please get next available appt with me or dr icard  RA ----- Message ----- From: Truitt Merle, MD Sent: 05/27/2018   9:07 AM EST To: Arna Snipe, RN, Rigoberto Noel, MD, #  Salley Scarlet Charlotte Sanes,  I just saw this nice guy for his right lung nodule and right hilar adenopathy, history of anal cancer treated in [facility] in Jan 2019. He has HIV and COPD, emphysema, still smokes a pack a day.  He has no insurance. Could you get him in for EBUS and biopsy of his right hilar node? I ordered a PET scan, hopefully will be done soon.  Thanks much,  Krista Blue

## 2018-06-01 ENCOUNTER — Telehealth: Payer: Self-pay | Admitting: Hematology

## 2018-06-01 NOTE — Telephone Encounter (Signed)
Attempted to contact patient regarding the transportation program. Left my contact information for patient to call back if he needs help with transportation.

## 2018-06-02 ENCOUNTER — Emergency Department (HOSPITAL_COMMUNITY): Payer: Medicaid Other

## 2018-06-02 ENCOUNTER — Encounter (HOSPITAL_COMMUNITY): Payer: Self-pay

## 2018-06-02 ENCOUNTER — Emergency Department (HOSPITAL_COMMUNITY)
Admission: EM | Admit: 2018-06-02 | Discharge: 2018-06-02 | Disposition: A | Discharge status: Home / Self Care | Payer: Medicaid Other | Attending: Emergency Medicine | Admitting: Emergency Medicine

## 2018-06-02 DIAGNOSIS — Y999 Unspecified external cause status: Secondary | ICD-10-CM | POA: Insufficient documentation

## 2018-06-02 DIAGNOSIS — Z79899 Other long term (current) drug therapy: Secondary | ICD-10-CM | POA: Insufficient documentation

## 2018-06-02 DIAGNOSIS — Y939 Activity, unspecified: Secondary | ICD-10-CM | POA: Insufficient documentation

## 2018-06-02 DIAGNOSIS — F17210 Nicotine dependence, cigarettes, uncomplicated: Secondary | ICD-10-CM | POA: Diagnosis not present

## 2018-06-02 DIAGNOSIS — B20 Human immunodeficiency virus [HIV] disease: Secondary | ICD-10-CM | POA: Diagnosis not present

## 2018-06-02 DIAGNOSIS — J449 Chronic obstructive pulmonary disease, unspecified: Secondary | ICD-10-CM | POA: Diagnosis not present

## 2018-06-02 DIAGNOSIS — S62348A Nondisplaced fracture of base of other metacarpal bone, initial encounter for closed fracture: Secondary | ICD-10-CM | POA: Diagnosis not present

## 2018-06-02 DIAGNOSIS — W1839XA Other fall on same level, initial encounter: Secondary | ICD-10-CM | POA: Insufficient documentation

## 2018-06-02 DIAGNOSIS — Y929 Unspecified place or not applicable: Secondary | ICD-10-CM | POA: Insufficient documentation

## 2018-06-02 DIAGNOSIS — S6991XA Unspecified injury of right wrist, hand and finger(s), initial encounter: Secondary | ICD-10-CM | POA: Diagnosis present

## 2018-06-02 MED ORDER — ACETAMINOPHEN ER 650 MG PO TBCR: 650.0000 mg | EXTENDED_RELEASE_TABLET | Freq: Three times a day (TID) | ORAL | 0 refills | Status: DC | PRN

## 2018-06-02 MED ORDER — ACETAMINOPHEN 325 MG PO TABS
650.0000 mg | ORAL_TABLET | Freq: Once | ORAL | Status: AC
  Administered 2018-06-02: 650 mg via ORAL
  Filled 2018-06-02: qty 2

## 2018-06-02 MED ORDER — HYDROCODONE-ACETAMINOPHEN 5-325 MG PO TABS: 1.0000 | ORAL_TABLET | Freq: Two times a day (BID) | ORAL | 0 refills | Status: DC | PRN

## 2018-06-02 NOTE — ED Notes (Signed)
Patient declined discharge vital signs. 

## 2018-06-02 NOTE — ED Notes (Signed)
Pt given the corrected ortho dr name and address: Dr Charlotte Crumb,, [address] [place] [phone]

## 2018-06-02 NOTE — ED Triage Notes (Signed)
Pt arrived from home due to a fall out of bed causing a hand injury. Pts right hand is swollen and tender to touch. ETOH on board.

## 2018-06-02 NOTE — ED Notes (Signed)
Pt woken up multiple times and reminded to leave

## 2018-06-02 NOTE — Discharge Instructions (Signed)
You are seen in the ER after you had a fall.  You have a small fracture at the base of your little finger. Please follow-up with the orthopedic surgery in 2 weeks.

## 2018-06-03 ENCOUNTER — Ambulatory Visit (HOSPITAL_COMMUNITY): Payer: Self-pay

## 2018-06-04 ENCOUNTER — Institutional Professional Consult (permissible substitution): Payer: Self-pay | Admitting: Pulmonary Disease

## 2018-06-05 NOTE — ED Provider Notes (Signed)
[facility] DEPT Provider Note   CSN: [id number] Arrival date & time: 06/02/18  6213     History   Chief Complaint Chief Complaint  Patient presents with  . Fall    HPI Jeremiah [name] is a 48 y.o. male.  HPI 48 year old male comes in with chief complaint of mechanical fall.  Patient reports that he had a fall because of balance issues earlier today and he ended up hurting his digit. Patient denies trauma elsewhere and has no complaints besides hand pain.  He also denies any numbness, tingling, headache, chest pain, abdominal pain, shortness of breath.  Past Medical History:  Diagnosis Date  . Anal warts   . COPD (chronic obstructive pulmonary disease) ([place])   . Hemorrhoids 02/25/2015  . History of tuberculin skin testing within last year 2014  . HIV (human immunodeficiency virus infection) ([place])   . Pneumonia 2007    Patient Active Problem List   Diagnosis Date Noted  . Lung nodule 01/06/2018  . Chronic pain 03/31/2016  . Rectal cancer ([place]) 11/07/2015  . Hyperglycemia 11/07/2015  . Gonorrhea 10/30/2015  . CAP (community acquired pneumonia) 02/25/2015  . Hemorrhoids 02/25/2015  . Human immunodeficiency virus (HIV) disease ([place]) 05/19/2013  . Dyslipidemia 05/19/2013  . Cigarette smoker 05/19/2013  . Depression 05/19/2013  . Dental caries 05/19/2013  . Hx of unilateral orchiectomy 05/19/2013  . Latent syphilis 05/06/2013  . Anal warts 05/06/2013  . History of chlamydia 05/06/2013    Past Surgical History:  Procedure Laterality Date  . HERNIA REPAIR  1991   Abdominal   . unilateral orchiectomy          Home Medications    Prior to Admission medications   Medication Sig Start Date End Date Taking? Authorizing Provider  BIKTARVY 50-200-25 MG TABS tablet TAKE 1 TABLET BY MOUTH DAILY Patient taking differently: Take 1 tablet by mouth daily.  05/06/18  Yes Michel Bickers, MD  doxycycline (VIBRAMYCIN) 100 MG capsule Take 1  capsule (100 mg total) by mouth 2 (two) times daily. 05/22/18  Yes Isla Pence, MD  predniSONE (STERAPRED UNI-PAK 21 TAB) 10 MG (21) TBPK tablet Take 6 tabs for 2 days, then 5 for 2 days, then 4 for 2 days, then 3 for 2 days, 2 for 2 days, then 1 for 2 days 05/22/18  Yes Isla Pence, MD  acetaminophen (TYLENOL 8 HOUR) 650 MG CR tablet Take 1 tablet (650 mg total) by mouth every 8 (eight) hours as needed. 06/02/18   Varney Biles, MD  HYDROcodone-acetaminophen (NORCO/VICODIN) 5-325 MG tablet Take 1 tablet by mouth every 12 (twelve) hours as needed for up to 4 doses for severe pain. 06/02/18   Varney Biles, MD    Family History Family History  Problem Relation Age of Onset  . Diabetes Mother   . Hypertension Mother   . Cancer Mother        breast  . Diabetes Father   . Hypertension Father     Social History Social History   Tobacco Use  . Smoking status: Current Every Day Smoker    Packs/day: 1.00    Years: 24.00    Pack years: 24.00    Types: Cigarettes  . Smokeless tobacco: Never Used  Substance Use Topics  . Alcohol use: Yes    Alcohol/week: 0.0 standard drinks    Comment: 40 oz every other day  . Drug use: No    Comment: Past history of crack cocaine  use  Allergies   Nsaids and Sulfa antibiotics   Review of Systems Review of Systems  Constitutional: Positive for activity change.  Respiratory: Negative for shortness of breath.   Cardiovascular: Negative for chest pain.  Gastrointestinal: Negative for abdominal pain.  Musculoskeletal: Positive for arthralgias.  Neurological: Negative for numbness and headaches.     Physical Exam Updated Vital Signs BP 121/87 (BP Location: Left Arm)   Pulse 85   Temp 99.1 F (37.3 C) (Oral)   Resp 18   SpO2 95%   Physical Exam Vitals signs and nursing note reviewed.  Constitutional:      Appearance: He is well-developed.  HENT:     Head: Atraumatic.  Neck:     Musculoskeletal: Neck supple.    Cardiovascular:     Rate and Rhythm: Normal rate.  Pulmonary:     Effort: Pulmonary effort is normal.  Musculoskeletal:     Comments: Edematous right hand dorsally, with significant tenderness over the metacarpal region at the base of fourth and fifth digit.  Patient is not complying with flexion and extension exam of the fifth digit, however there is no signs of any trauma on the flexor/palmar surface of the hand  Skin:    General: Skin is warm.  Neurological:     Mental Status: He is alert and oriented to person, place, and time.     Sensory: No sensory deficit.      ED Treatments / Results  Labs (all labs ordered are listed, but only abnormal results are displayed) Labs Reviewed - No data to display  EKG None  Radiology No results found.  Procedures Procedures (including critical care time)  Medications Ordered in ED Medications  acetaminophen (TYLENOL) tablet 650 mg (650 mg Oral Given 06/02/18 0900)     Initial Impression / Assessment and Plan / ED Course  I have reviewed the triage vital signs and the nursing notes.  Pertinent labs & imaging results that were available during my care of the patient were reviewed by me and considered in my medical decision making (see chart for details).     Patient comes in with a mechanical fall and it appears that he had a [place] mechanism that has led to fracture of fifth MCP.  Uncomplicated appearing fracture.  We will put him in an ulnar gutter splint and have him follow-up with hand surgery.  Final Clinical Impressions(s) / ED Diagnoses   Final diagnoses:  Closed nondisplaced fracture of base of fifth metacarpal bone, unspecified laterality, initial encounter    ED Discharge Orders         Ordered    acetaminophen (TYLENOL 8 HOUR) 650 MG CR tablet  Every 8 hours PRN     06/02/18 0911    HYDROcodone-acetaminophen (NORCO/VICODIN) 5-325 MG tablet  Every 12 hours PRN     06/02/18 0911           Varney Biles,  MD 06/05/18 2358

## 2018-06-08 ENCOUNTER — Encounter (HOSPITAL_COMMUNITY)
Admission: RE | Admit: 2018-06-08 | Discharge: 2018-06-08 | Disposition: A | Discharge status: Home / Self Care | Payer: Medicaid Other | Source: Ambulatory Visit | Attending: Hematology | Admitting: Hematology

## 2018-06-08 DIAGNOSIS — R911 Solitary pulmonary nodule: Secondary | ICD-10-CM | POA: Diagnosis not present

## 2018-06-08 LAB — GLUCOSE, CAPILLARY: Glucose-Capillary: 91 mg/dL (ref 70–99)

## 2018-06-08 MED ORDER — FLUDEOXYGLUCOSE F - 18 (FDG) INJECTION
7.5000 | Freq: Once | INTRAVENOUS | Status: AC | PRN
  Administered 2018-06-08: 7.5 via INTRAVENOUS

## 2018-06-09 ENCOUNTER — Ambulatory Visit (INDEPENDENT_AMBULATORY_CARE_PROVIDER_SITE_OTHER): Payer: Medicaid Other | Admitting: Pulmonary Disease

## 2018-06-09 ENCOUNTER — Encounter: Payer: Self-pay | Admitting: Internal Medicine

## 2018-06-09 ENCOUNTER — Ambulatory Visit (INDEPENDENT_AMBULATORY_CARE_PROVIDER_SITE_OTHER): Payer: Medicaid Other | Admitting: Internal Medicine

## 2018-06-09 ENCOUNTER — Encounter: Payer: Self-pay | Admitting: Pulmonary Disease

## 2018-06-09 ENCOUNTER — Telehealth: Payer: Self-pay | Admitting: Pulmonary Disease

## 2018-06-09 VITALS — BP 106/72 | HR 85 | Ht 71.0 in | Wt 156.6 lb

## 2018-06-09 DIAGNOSIS — R911 Solitary pulmonary nodule: Secondary | ICD-10-CM | POA: Diagnosis not present

## 2018-06-09 DIAGNOSIS — C20 Malignant neoplasm of rectum: Secondary | ICD-10-CM | POA: Diagnosis not present

## 2018-06-09 DIAGNOSIS — F3342 Major depressive disorder, recurrent, in full remission: Secondary | ICD-10-CM | POA: Diagnosis not present

## 2018-06-09 DIAGNOSIS — A530 Latent syphilis, unspecified as early or late: Secondary | ICD-10-CM

## 2018-06-09 DIAGNOSIS — B20 Human immunodeficiency virus [HIV] disease: Secondary | ICD-10-CM | POA: Diagnosis not present

## 2018-06-09 DIAGNOSIS — K029 Dental caries, unspecified: Secondary | ICD-10-CM | POA: Diagnosis not present

## 2018-06-09 DIAGNOSIS — F17210 Nicotine dependence, cigarettes, uncomplicated: Secondary | ICD-10-CM

## 2018-06-09 NOTE — Assessment & Plan Note (Signed)
His infection remains under excellent control but he is not having any significant CD4 reconstitution following recent chemotherapy for rectal cancer.  He will continue Biktarvy and follow-up after lab work in 3 months.

## 2018-06-09 NOTE — Telephone Encounter (Signed)
lmom X1 

## 2018-06-09 NOTE — Assessment & Plan Note (Signed)
His depression is in remission. 

## 2018-06-09 NOTE — Assessment & Plan Note (Signed)
His RPR is down to 1:1 following treatment.  He has not been sexually active since his treatment.

## 2018-06-09 NOTE — Telephone Encounter (Signed)
Patient called back I let him know I was unsure who original called him but we have no record of anyone calling.  He said he call back after listing to voicemail.

## 2018-06-09 NOTE — Patient Instructions (Signed)
We will schedule you for pulmonary function test for evaluation of your lungs and see if he will be a candidate for surgery Going to refer you to a cardiothoracic surgeon for evaluation. Lung function tests are okay then the best option may be to undergo surgery to resect the lung nodule altogether  Follow-up in 1 month

## 2018-06-09 NOTE — Assessment & Plan Note (Signed)
I encouraged him to consider setting a quit date and trying to quit smoking completely.

## 2018-06-09 NOTE — Assessment & Plan Note (Signed)
Dental referral placed today for [facility]. Information to schedule appointment completed today.   

## 2018-06-09 NOTE — Progress Notes (Deleted)
Jeremiah [name]    [id number]    Sep 13, 1970  Primary Care [facility], [facility]  Referring Physician: Inc, [facility] [place], [place] [address]  Chief complaint:  ***  HPI:  ***  Pets: Occupation: Exposures: Smoking history: Travel history: Relevant family history:   Outpatient Encounter Medications as of 06/09/2018  Medication Sig  . acetaminophen (TYLENOL 8 HOUR) 650 MG CR tablet Take 1 tablet (650 mg total) by mouth every 8 (eight) hours as needed.  Marland Kitchen BIKTARVY 50-200-25 MG TABS tablet TAKE 1 TABLET BY MOUTH DAILY (Patient taking differently: Take 1 tablet by mouth daily. )  . HYDROcodone-acetaminophen (NORCO/VICODIN) 5-325 MG tablet Take 1 tablet by mouth every 12 (twelve) hours as needed for up to 4 doses for severe pain.  . [DISCONTINUED] doxycycline (VIBRAMYCIN) 100 MG capsule Take 1 capsule (100 mg total) by mouth 2 (two) times daily.  . [DISCONTINUED] predniSONE (STERAPRED UNI-PAK 21 TAB) 10 MG (21) TBPK tablet Take 6 tabs for 2 days, then 5 for 2 days, then 4 for 2 days, then 3 for 2 days, 2 for 2 days, then 1 for 2 days   No facility-administered encounter medications on file as of 06/09/2018.     Allergies as of 06/09/2018 - Review Complete 06/09/2018  Allergen Reaction Noted  . Nsaids Other (See Comments) 02/25/2015  . Sulfa antibiotics Itching 02/25/2015    Past Medical History:  Diagnosis Date  . Anal warts   . COPD (chronic obstructive pulmonary disease) ([place])   . Hemorrhoids 02/25/2015  . History of tuberculin skin testing within last year 2014  . HIV (human immunodeficiency virus infection) ([place])   . Pneumonia 2007    Past Surgical History:  Procedure Laterality Date  . HERNIA REPAIR  1991   Abdominal   . unilateral orchiectomy      Family History  Problem Relation Age of Onset  . Diabetes Mother   . Hypertension Mother   . Cancer Mother        breast  .  Diabetes Father   . Hypertension Father     Social History   Socioeconomic History  . Marital status: Single    Spouse name: Not on file  . Number of children: Not on file  . Years of education: Not on file  . Highest education level: Not on file  Occupational History  . Not on file  Social Needs  . Financial resource strain: Not on file  . Food insecurity:    Worry: Not on file    Inability: Not on file  . Transportation needs:    Medical: Not on file    Non-medical: Not on file  Tobacco Use  . Smoking status: Current Every Day Smoker    Packs/day: 1.00    Years: 24.00    Pack years: 24.00    Types: Cigarettes  . Smokeless tobacco: Never Used  Substance and Sexual Activity  . Alcohol use: Yes    Alcohol/week: 0.0 standard drinks    Comment: less than 40 oz a week  . Drug use: No    Comment: Past history of crack cocaine  use   . Sexual activity: Yes    Partners: Male    Birth control/protection: Condom    Comment: declined condoms  Lifestyle  . Physical activity:    Days per week: Not on file    Minutes per session: Not on file  .  Stress: Not on file  Relationships  . Social connections:    Talks on phone: Not on file    Gets together: Not on file    Attends religious service: Not on file    Active member of club or organization: Not on file    Attends meetings of clubs or organizations: Not on file    Relationship status: Not on file  . Intimate partner violence:    Fear of current or ex partner: Not on file    Emotionally abused: Not on file    Physically abused: Not on file    Forced sexual activity: Not on file  Other Topics Concern  . Not on file  Social History Narrative  . Not on file    Review of systems: Review of Systems  Constitutional: Negative for fever and chills.  HENT: Negative.   Eyes: Negative for blurred vision.  Respiratory: as per HPI  Cardiovascular: Negative for chest pain and palpitations.  Gastrointestinal: Negative for  vomiting, diarrhea, blood per rectum. Genitourinary: Negative for dysuria, urgency, frequency and hematuria.  Musculoskeletal: Negative for myalgias, back pain and joint pain.  Skin: Negative for itching and rash.  Neurological: Negative for dizziness, tremors, focal weakness, seizures and loss of consciousness.  Endo/Heme/Allergies: Negative for environmental allergies.  Psychiatric/Behavioral: Negative for depression, suicidal ideas and hallucinations.  All other systems reviewed and are negative.  Physical Exam: Blood pressure 106/72, pulse 85, height 5\' 11"  (1.803 m), weight 156 lb 9.6 oz (71 kg), SpO2 98 %. Gen:      No acute distress HEENT:  EOMI, sclera anicteric Neck:     No masses; no thyromegaly Lungs:    Clear to auscultation bilaterally; normal respiratory effort*** CV:         Regular rate and rhythm; no murmurs Abd:      + bowel sounds; soft, non-tender; no palpable masses, no distension Ext:    No edema; adequate peripheral perfusion Skin:      Warm and dry; no rash Neuro: alert and oriented x 3 Psych: normal mood and affect  Data Reviewed: Imaging:  PFTs:  Labs:  Assessment:  ***  Plan/Recommendations: ***  Marshell Garfinkel MD [facility] 06/09/2018, 11:18 AM  CC: Inc, [facility]

## 2018-06-09 NOTE — Assessment & Plan Note (Signed)
His Port-A-Cath remains in following treatment for rectal cancer.  He has no evidence of persistent/recurrent cancer by exam but will need follow-up with general surgery.  I will see if we can arrange to have his Port-A-Cath flushed when he returns to the cancer center next week.  Given his ongoing evaluation for the right upper lobe nodule leave the Port-A-Cath in for now.

## 2018-06-09 NOTE — Progress Notes (Signed)
Patient Active Problem List   Diagnosis Date Noted  . Lung nodule 01/06/2018    Priority: High  . Rectal cancer ([place]) 11/07/2015    Priority: High  . Human immunodeficiency virus (HIV) disease ([place]) 05/19/2013    Priority: High  . Chronic pain 03/31/2016  . Hyperglycemia 11/07/2015  . Gonorrhea 10/30/2015  . CAP (community acquired pneumonia) 02/25/2015  . Hemorrhoids 02/25/2015  . Dyslipidemia 05/19/2013  . Cigarette smoker 05/19/2013  . Depression 05/19/2013  . Dental caries 05/19/2013  . Hx of unilateral orchiectomy 05/19/2013  . Latent syphilis 05/06/2013  . Anal warts 05/06/2013  . History of chlamydia 05/06/2013    Patient's Medications  New Prescriptions   No medications on file  Previous Medications   ACETAMINOPHEN (TYLENOL 8 HOUR) 650 MG CR TABLET    Take 1 tablet (650 mg total) by mouth every 8 (eight) hours as needed.   BIKTARVY 50-200-25 MG TABS TABLET    TAKE 1 TABLET BY MOUTH DAILY   DOXYCYCLINE (VIBRAMYCIN) 100 MG CAPSULE    Take 1 capsule (100 mg total) by mouth 2 (two) times daily.   HYDROCODONE-ACETAMINOPHEN (NORCO/VICODIN) 5-325 MG TABLET    Take 1 tablet by mouth every 12 (twelve) hours as needed for up to 4 doses for severe pain.   PREDNISONE (STERAPRED UNI-PAK 21 TAB) 10 MG (21) TBPK TABLET    Take 6 tabs for 2 days, then 5 for 2 days, then 4 for 2 days, then 3 for 2 days, 2 for 2 days, then 1 for 2 days  Modified Medications   No medications on file  Discontinued Medications   No medications on file    Subjective: Jeremiah [name] is in for his routine HIV follow-up visit.  He has had no problems obtaining, taking or tolerating his Biktarvy and does not recall missing any doses.  He recently got angry and hit a wall in his apartment with his right hand and suffered a right fifth metacarpal fracture.  He was seen in the emergency department and had a splint placed.  He has been referred to hand surgery but has not seen them yet.  He recently had a  CT scan which showed the right upper lobe nodule that had been noted when he was living in [place], [place].  He saw Dr. Burr Medico at the cancer center recently and underwent a PET scan yesterday which showed increased radiotracer uptake in the right upper lobe.  He is scheduled to be seen by pulmonary today for consideration of hilar lymph node biopsy.  He is still smoking cigarettes but says he is cut down to well less than a pack a day.  He is considering using nicotine patches to try to quit.  He does not have any problems with his Port-A-Cath but it has not been flushed since he moved back to [place] months ago.  Review of Systems: Review of Systems  Constitutional: Negative for chills, diaphoresis, fever, malaise/fatigue and weight loss.  HENT: Negative for sore throat.   Respiratory: Positive for shortness of breath. Negative for cough and sputum production.   Cardiovascular: Negative for chest pain.  Gastrointestinal: Negative for abdominal pain, diarrhea, heartburn, nausea and vomiting.  Genitourinary: Negative for dysuria and frequency.  Musculoskeletal: Positive for joint pain. Negative for myalgias.  Skin: Negative for rash.  Neurological: Negative for dizziness and headaches.  Psychiatric/Behavioral: Negative for depression and substance abuse. The patient is not nervous/anxious.     Past  Medical History:  Diagnosis Date  . Anal warts   . COPD (chronic obstructive pulmonary disease) ([place])   . Hemorrhoids 02/25/2015  . History of tuberculin skin testing within last year 2014  . HIV (human immunodeficiency virus infection) ([place])   . Pneumonia 2007    Social History   Tobacco Use  . Smoking status: Current Every Day Smoker    Packs/day: 1.00    Years: 24.00    Pack years: 24.00    Types: Cigarettes  . Smokeless tobacco: Never Used  Substance Use Topics  . Alcohol use: Yes    Alcohol/week: 0.0 standard drinks    Comment: less than 40 oz a week  . Drug use: No     Comment: Past history of crack cocaine  use     Family History  Problem Relation Age of Onset  . Diabetes Mother   . Hypertension Mother   . Cancer Mother        breast  . Diabetes Father   . Hypertension Father     Allergies  Allergen Reactions  . Nsaids Other (See Comments)    DRUG INTERACTION WITH HIV MEDS  . Sulfa Antibiotics Itching    Health Maintenance  Topic Date Due  . TETANUS/TDAP  10/28/1989  . INFLUENZA VACCINE  Completed  . HIV Screening  Completed    Objective:  Vitals:   06/09/18 0835  Height: 5\' 11"  (1.803 m)   Body mass index is 21.65 kg/m.  Physical Exam Constitutional:      Comments: He is in good spirits.  HENT:     Mouth/Throat:     Pharynx: No oropharyngeal exudate.     Comments: He has numerous missing teeth.  His remaining teeth are in only fair condition. Eyes:     Conjunctiva/sclera: Conjunctivae normal.  Cardiovascular:     Rate and Rhythm: Normal rate and regular rhythm.     Heart sounds: No murmur.  Pulmonary:     Effort: Pulmonary effort is normal.     Breath sounds: No wheezing or rales.  Chest:    Abdominal:     Palpations: Abdomen is soft.     Tenderness: There is no abdominal tenderness.  Musculoskeletal:     Comments: He has a splint and Ace wrap on his right hand and wrist.  Skin:    Findings: No rash.  Psychiatric:        Mood and Affect: Mood normal.     Lab Results Lab Results  Component Value Date   WBC 3.8 (L) 05/22/2018   HGB 14.6 05/22/2018   HCT 43.7 05/22/2018   MCV 105.6 (H) 05/22/2018   PLT 120 (L) 05/22/2018    Lab Results  Component Value Date   CREATININE 1.15 05/22/2018   BUN 8 05/22/2018   NA 142 05/22/2018   K 3.9 05/22/2018   CL 106 05/22/2018   CO2 27 05/22/2018    Lab Results  Component Value Date   ALT 27 05/07/2018   AST 44 (H) 05/07/2018   ALKPHOS 55 10/31/2015   BILITOT 1.5 (H) 05/07/2018    Lab Results  Component Value Date   CHOL 186 05/07/2018   HDL 63  05/07/2018   LDLCALC 91 05/07/2018   TRIG 234 (H) 05/07/2018   CHOLHDL 3.0 05/07/2018   Lab Results  Component Value Date   LABRPR REACTIVE (A) 05/07/2018   RPRTITER 1:1 (H) 05/07/2018   HIV 1 RNA Quant (copies/mL)  Date Value  05/07/2018 <20 NOT  DETECTED  12/02/2017 <20 NOT DETECTED  03/31/2016 <20   CD4 T Cell Abs (/uL)  Date Value  05/07/2018 200 (L)  01/06/2018 200 (L)  12/02/2017 160 (L)     Problem List Items Addressed This Visit      High   Rectal cancer ([place])    His Port-A-Cath remains in following treatment for rectal cancer.  He has no evidence of persistent/recurrent cancer by exam but will need follow-up with general surgery.  I will see if we can arrange to have his Port-A-Cath flushed when he returns to the cancer center next week.  Given his ongoing evaluation for the right upper lobe nodule leave the Port-A-Cath in for now.      Lung nodule    He may have lung cancer metastatic from his rectal cancer or primary lung cancer.  He has COPD so we will biopsy of the right upper lobe nodule poses significant risk.  He is scheduled to be seen by pulmonary today for consideration of endobronchial biopsy of a hilar node.      Human immunodeficiency virus (HIV) disease ([place])    His infection remains under excellent control but he is not having any significant CD4 reconstitution following recent chemotherapy for rectal cancer.  He will continue Biktarvy and follow-up after lab work in 3 months.      Relevant Orders   T-helper cell (CD4)- ([facility] clinic only)   HIV-1 RNA quant-no reflex-bld   CBC   Comprehensive metabolic panel   RPR     Unprioritized   Latent syphilis    His RPR is down to 1:1 following treatment.  He has not been sexually active since his treatment.      Depression    His depression is in remission.      Dental caries    Dental referral placed today for [facility]. Information to schedule appointment completed today.        Cigarette smoker    I encouraged him to consider setting a quit date and trying to quit smoking completely.           Michel Bickers, MD [facility] [phone] pager   [phone] cell 06/09/2018, 9:08 AM

## 2018-06-09 NOTE — Progress Notes (Signed)
Jeremiah [name]    [id number]    [birth year]  Primary Care [facility], [facility]  Referring Physician: Inc, [facility] [place], [place] [address]  Chief complaint: Consult for lung nodule  HPI: 49 year old active smoker with HIV, anal cancer.  Referred for abnormal CT with right lung nodule  Diagnosed with anal cancer in November of 2018 and underwent chemotherapy, radiation at [facility] in [place].  Noted to have lung nodules on follow-up in March 2019 in [place] and surveillance recommended.  He has subsequently had CT scan and PET scan follow-up at [facility] this month which showed an FDG avid 1.7 cm nodule in the right upper lobe.  Has chronic cough with white mucus, no hemoptysis, loss of weight, loss of appetite. 30-pack-year smoker and continues to smoke 1 pack/day.  Been told that he has COPD but no PFTs on record.  Outpatient Encounter Medications as of 06/09/2018  Medication Sig  . acetaminophen (TYLENOL 8 HOUR) 650 MG CR tablet Take 1 tablet (650 mg total) by mouth every 8 (eight) hours as needed.  Marland Kitchen BIKTARVY 50-200-25 MG TABS tablet TAKE 1 TABLET BY MOUTH DAILY (Patient taking differently: Take 1 tablet by mouth daily. )  . HYDROcodone-acetaminophen (NORCO/VICODIN) 5-325 MG tablet Take 1 tablet by mouth every 12 (twelve) hours as needed for up to 4 doses for severe pain.  . [DISCONTINUED] doxycycline (VIBRAMYCIN) 100 MG capsule Take 1 capsule (100 mg total) by mouth 2 (two) times daily.  . [DISCONTINUED] predniSONE (STERAPRED UNI-PAK 21 TAB) 10 MG (21) TBPK tablet Take 6 tabs for 2 days, then 5 for 2 days, then 4 for 2 days, then 3 for 2 days, 2 for 2 days, then 1 for 2 days   No facility-administered encounter medications on file as of 06/09/2018.     Allergies as of 06/09/2018 - Review Complete 06/09/2018  Allergen Reaction Noted  . Nsaids Other (See Comments) 02/25/2015    . Sulfa antibiotics Itching 02/25/2015    Past Medical History:  Diagnosis Date  . Anal warts   . COPD (chronic obstructive pulmonary disease) ([place])   . Hemorrhoids 02/25/2015  . History of tuberculin skin testing within last year 2014  . HIV (human immunodeficiency virus infection) ([place])   . Pneumonia 2007    Past Surgical History:  Procedure Laterality Date  . HERNIA REPAIR  1991   Abdominal   . unilateral orchiectomy      Family History  Problem Relation Age of Onset  . Diabetes Mother   . Hypertension Mother   . Cancer Mother        breast  . Diabetes Father   . Hypertension Father     Social History   Socioeconomic History  . Marital status: Single    Spouse name: Not on file  . Number of children: Not on file  . Years of education: Not on file  . Highest education level: Not on file  Occupational History  . Not on file  Social Needs  . Financial resource strain: Not on file  . Food insecurity:    Worry: Not on file    Inability: Not on file  . Transportation needs:    Medical: Not on file    Non-medical: Not on file  Tobacco Use  . Smoking status: Current Every Day Smoker    Packs/day: 1.00    Years: 24.00    Pack  years: 24.00    Types: Cigarettes  . Smokeless tobacco: Never Used  Substance and Sexual Activity  . Alcohol use: Yes    Alcohol/week: 0.0 standard drinks    Comment: less than 40 oz a week  . Drug use: No    Comment: Past history of crack cocaine  use   . Sexual activity: Yes    Partners: Male    Birth control/protection: Condom    Comment: declined condoms  Lifestyle  . Physical activity:    Days per week: Not on file    Minutes per session: Not on file  . Stress: Not on file  Relationships  . Social connections:    Talks on phone: Not on file    Gets together: Not on file    Attends religious service: Not on file    Active member of club or organization: Not on file    Attends meetings of clubs or organizations: Not on  file    Relationship status: Not on file  . Intimate partner violence:    Fear of current or ex partner: Not on file    Emotionally abused: Not on file    Physically abused: Not on file    Forced sexual activity: Not on file  Other Topics Concern  . Not on file  Social History Narrative  . Not on file    Review of systems: Review of Systems  Constitutional: Negative for fever and chills.  HENT: Negative.   Eyes: Negative for blurred vision.  Respiratory: as per HPI  Cardiovascular: Negative for chest pain and palpitations.  Gastrointestinal: Negative for vomiting, diarrhea, blood per rectum. Genitourinary: Negative for dysuria, urgency, frequency and hematuria.  Musculoskeletal: Negative for myalgias, back pain and joint pain.  Skin: Negative for itching and rash.  Neurological: Negative for dizziness, tremors, focal weakness, seizures and loss of consciousness.  Endo/Heme/Allergies: Negative for environmental allergies.  Psychiatric/Behavioral: Negative for depression, suicidal ideas and hallucinations.  All other systems reviewed and are negative.  Physical Exam: Blood pressure 106/72, pulse 85, height 5\' 11"  (1.803 m), weight 156 lb 9.6 oz (71 kg), SpO2 98 %. Gen:      No acute distress HEENT:  EOMI, sclera anicteric Neck:     No masses; no thyromegaly Lungs:    Clear to auscultation bilaterally; normal respiratory effort CV:         Regular rate and rhythm; no murmurs Abd:      + bowel sounds; soft, non-tender; no palpable masses, no distension Ext:    No edema; adequate peripheral perfusion Skin:      Warm and dry; no rash Neuro: alert and oriented x 3 Psych: normal mood and affect  Data Reviewed: Imaging: CT chest, abdomen pelvis 05/22/2018- 1.7 cm right upper lobe nodule, 2.1 cm right hilar lymph node.  No significant abnormality in the abdomen or pelvis.  PET scan 06/08/2018- FDG uptake in right upper lobe lung nodule, SUV 4.04.  No additional uptake elsewhere in  the body or in the mediastinum.  Mild nonspecific uptake in the fat at the level of anus.  Assessment:  Right upper lobe lung nodule Concerning for malignancy.  Suspect this is a primary bronchogenic cancer in a smoker.  He does have history of anal cancer but no evidence of recurrence at anal site.  I have reviewed the plans with my colleague Dr. Valeta Harms.  We feel that biopsy of the lung nodule is high risk as it is situated next to an emphysematous  bulla.  No clear evidence of mediastinal lymph node enlargement or FDG uptake in the mediastinum to pursue an EBUS.  The best option would be to get pulmonary function test and referred to cardiothoracic surgery for consideration of resection. He may be able to get a bullectomy at the same time.  All this was discussed in detail with the patient today.  Plan/Recommendations: - PFTs - Referral to cardiothoracic surgery  Marshell Garfinkel MD [facility] 06/09/2018, 11:27 AM  CC: Inc, [facility]

## 2018-06-09 NOTE — Assessment & Plan Note (Signed)
He may have lung cancer metastatic from his rectal cancer or primary lung cancer.  He has COPD so we will biopsy of the right upper lobe nodule poses significant risk.  He is scheduled to be seen by pulmonary today for consideration of endobronchial biopsy of a hilar node.

## 2018-06-10 ENCOUNTER — Ambulatory Visit (INDEPENDENT_AMBULATORY_CARE_PROVIDER_SITE_OTHER): Payer: Medicaid Other | Admitting: Pulmonary Disease

## 2018-06-10 DIAGNOSIS — R911 Solitary pulmonary nodule: Secondary | ICD-10-CM

## 2018-06-10 LAB — PULMONARY FUNCTION TEST
DL/VA % pred: 89 %
DL/VA: 4.03 ml/min/mmHg/L
DLCO unc % pred: 77 %
DLCO unc: 23.8 ml/min/mmHg
FEF 25-75 POST: 3.16 L/s
FEF 25-75 Pre: 2.33 L/sec
FEF2575-%Change-Post: 35 %
FEF2575-%PRED-PRE: 65 %
FEF2575-%Pred-Post: 88 %
FEV1-%Change-Post: 8 %
FEV1-%Pred-Post: 108 %
FEV1-%Pred-Pre: 99 %
FEV1-Post: 3.83 L
FEV1-Pre: 3.52 L
FEV1FVC-%Change-Post: 7 %
FEV1FVC-%Pred-Pre: 88 %
FEV6-%Change-Post: 1 %
FEV6-%Pred-Post: 116 %
FEV6-%Pred-Pre: 114 %
FEV6-Post: 5.01 L
FEV6-Pre: 4.93 L
FEV6FVC-%Change-Post: 0 %
FEV6FVC-%Pred-Post: 102 %
FEV6FVC-%Pred-Pre: 102 %
FVC-%Change-Post: 1 %
FVC-%PRED-PRE: 112 %
FVC-%Pred-Post: 113 %
FVC-PRE: 4.95 L
FVC-Post: 5.01 L
PRE FEV1/FVC RATIO: 71 %
PRE FEV6/FVC RATIO: 99 %
Post FEV1/FVC ratio: 76 %
Post FEV6/FVC ratio: 100 %
RV % pred: 97 %
RV: 1.98 L
TLC % pred: 93 %
TLC: 6.65 L

## 2018-06-10 NOTE — Telephone Encounter (Signed)
Will close message.  

## 2018-06-10 NOTE — Progress Notes (Signed)
PFT done today. 

## 2018-06-11 ENCOUNTER — Encounter: Payer: Self-pay | Admitting: Cardiothoracic Surgery

## 2018-06-11 ENCOUNTER — Other Ambulatory Visit: Payer: Self-pay | Admitting: Internal Medicine

## 2018-06-11 DIAGNOSIS — B20 Human immunodeficiency virus [HIV] disease: Secondary | ICD-10-CM

## 2018-06-14 ENCOUNTER — Institutional Professional Consult (permissible substitution) (INDEPENDENT_AMBULATORY_CARE_PROVIDER_SITE_OTHER): Payer: Medicaid Other | Admitting: Cardiothoracic Surgery

## 2018-06-14 VITALS — BP 114/77 | HR 87 | Resp 20 | Ht 71.0 in | Wt 155.0 lb

## 2018-06-14 DIAGNOSIS — R911 Solitary pulmonary nodule: Secondary | ICD-10-CM
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
## 2018-06-15 ENCOUNTER — Encounter: Payer: Self-pay | Admitting: *Deleted

## 2018-06-15 ENCOUNTER — Other Ambulatory Visit: Payer: Self-pay | Admitting: *Deleted

## 2018-06-15 DIAGNOSIS — R911 Solitary pulmonary nodule: Secondary | ICD-10-CM

## 2018-06-16 ENCOUNTER — Encounter: Payer: Self-pay | Admitting: General Practice

## 2018-06-16 ENCOUNTER — Telehealth: Payer: Self-pay | Admitting: General Practice

## 2018-06-16 ENCOUNTER — Encounter: Payer: Self-pay | Admitting: Internal Medicine

## 2018-06-16 NOTE — Telephone Encounter (Signed)
[place] CSW Progress Notes  Call from patient, concerned that he will be unable to work for several weeks post surgery (scheduled for next Weds), afraid he will lose his apartment as a result.  CSW will ask Financial Advocate if patient is eligible for [employer], also encouraged patient to reach out to Deri Fuelling, [facility] case manager, to see if they have any available assistance as well.  VM left for Ishmael Holter w patient's permission, patient also encouraged to call.  Edwyna Shell, LCSW Clinical Social Worker Phone:  [phone]

## 2018-06-16 NOTE — Progress Notes (Signed)
[place] CSW Progress Notes  Per financial advocate, patient is not eligible yet for [facility] as he does not have a treatment plan in place.  Financial advocate will reach out to him once he has required documentation and explain process.    Edwyna Shell, LCSW Clinical Social Worker Phone:  [phone]

## 2018-06-16 NOTE — Progress Notes (Signed)
[place]   [phone] Fax:([phone]   Clinic Follow up Note   Patient Care Team: [facility] as PCP - General Michel Bickers, MD as PCP - Infectious Diseases (Infectious Diseases) Charlott Rakes, MD as Consulting Physician (Family Medicine)  Date of Service:  06/18/2018  CHIEF COMPLAINT: F/u suspicious right lung nodules   CURRENT THERAPY:  Surgery on 06/23/18    INTERVAL HISTORY:  Jeremiah [name] is here for a follow up of suspicious lung nodules. He presents to the clinic today by himself. He notes he is doing well. He has a recent right wrist fracture and is wearing a wrist brace, he did not need surgery for this.   He notes he has been able to slow down on smoking but has not quit but is willing. He is willing to try nicotine patch. He notes he still does not have medical coverage.    REVIEW OF SYSTEMS:   Constitutional: Denies fevers, chills or abnormal weight loss Eyes: Denies blurriness of vision Ears, nose, mouth, throat, and face: Denies mucositis or sore throat Respiratory: Denies cough, dyspnea or wheezes Cardiovascular: Denies palpitation, chest discomfort or lower extremity swelling Gastrointestinal:  Denies nausea, heartburn or change in bowel habits Skin: Denies abnormal skin rashes Lymphatics: Denies new lymphadenopathy or easy bruising Neurological:Denies numbness, tingling or new weaknesses Behavioral/Psych: Mood is stable, no new changes  All other systems were reviewed with the patient and are negative.  MEDICAL HISTORY:  Past Medical History:  Diagnosis Date  . Anal warts   . COPD (chronic obstructive pulmonary disease) ([place])   . Hemorrhoids 02/25/2015  . History of tuberculin skin testing within last year 2014  . HIV (human immunodeficiency virus infection) ([place])   . Pneumonia 2007    SURGICAL HISTORY: Past Surgical History:  Procedure Laterality Date  . HERNIA REPAIR  1991   Abdominal   . unilateral orchiectomy      I have reviewed the social history and family history with the patient and they are unchanged from previous note.  ALLERGIES:  is allergic to nsaids and sulfa antibiotics.  MEDICATIONS:  Current Outpatient Medications  Medication Sig Dispense Refill  . acetaminophen (TYLENOL 8 HOUR) 650 MG CR tablet Take 1 tablet (650 mg total) by mouth every 8 (eight) hours as needed. 30 tablet 0  . albuterol (PROVENTIL HFA;VENTOLIN HFA) 108 (90 Base) MCG/ACT inhaler Inhale 1-2 puffs into the lungs every 6 (six) hours as needed for wheezing or shortness of breath.    . Aspirin-Acetaminophen (GOODYS BODY PAIN PO) Take 1 packet by mouth daily as needed (pain).    Marland Kitchen BIKTARVY 50-200-25 MG TABS tablet TAKE 1 TABLET BY MOUTH DAILY (Patient taking differently: Take 1 tablet by mouth daily. ) 30 tablet 5   No current facility-administered medications for this visit.     PHYSICAL EXAMINATION: ECOG PERFORMANCE STATUS: 0 - Asymptomatic  Vitals:   06/18/18 1138  BP: 115/82  Pulse: 65  Resp: 17  Temp: 98.2 F (36.8 C)  SpO2: 100%   Filed Weights   06/18/18 1138  Weight: 157 lb 14.4 oz (71.6 kg)    GENERAL:alert, no distress and comfortable SKIN: skin color, texture, turgor are normal, no rashes or significant lesions EYES: normal, Conjunctiva are pink and non-injected, sclera clear OROPHARYNX:no exudate, no erythema and lips, buccal mucosa, and tongue normal  NECK: supple, thyroid normal size, non-tender, without nodularity LYMPH:  no palpable lymphadenopathy in the cervical, axillary or inguinal LUNGS:  clear to auscultation and percussion with normal breathing effort HEART: regular rate & rhythm and no murmurs and no lower extremity edema ABDOMEN:abdomen soft, non-tender and normal bowel sounds Musculoskeletal:no cyanosis of digits and no clubbing  NEURO: alert & oriented x 3 with fluent speech, no focal motor/sensory deficits  LABORATORY DATA:  I have  reviewed the data as listed CBC Latest Ref Rng & Units 06/18/2018 05/22/2018 05/07/2018  WBC 4.0 - 10.5 K/uL 3.1(L) 3.8(L) 3.7(L)  Hemoglobin 13.0 - 17.0 g/dL 13.2 14.6 14.5  Hematocrit 39.0 - 52.0 % 37.6(L) 43.7 40.7  Platelets 150 - 400 K/uL 108(L) 120(L) 144     CMP Latest Ref Rng & Units 06/18/2018 05/22/2018 05/07/2018  Glucose 70 - 99 mg/dL 152(H) 96 66  BUN 6 - 20 mg/dL 13 8 15   Creatinine 0.61 - 1.24 mg/dL 1.11 1.15 1.06  Sodium 135 - 145 mmol/L 140 142 141  Potassium 3.5 - 5.1 mmol/L 3.8 3.9 4.1  Chloride 98 - 111 mmol/L 107 106 107  CO2 22 - 32 mmol/L 24 27 28   Calcium 8.9 - 10.3 mg/dL 8.7(L) 8.9 9.1  Total Protein 6.5 - 8.1 g/dL 6.7 - 6.1  Total Bilirubin 0.3 - 1.2 mg/dL 1.0 - 1.5(H)  Alkaline Phos 38 - 126 U/L 55 - -  AST 15 - 41 U/L 41 - 44(H)  ALT 0 - 44 U/L 35 - 27      RADIOGRAPHIC STUDIES: I have personally reviewed the radiological images as listed and agreed with the findings in the report. No results found.   PET 06/18/18  IMPRESSION: 1. Increased radiotracer uptake associated with right upper lobe pulmonary nodule is identified within SUV max of 4.04. Finding is worrisome for malignancy. Given the extensive smoking related changes within the lungs findings may reflect primary bronchogenic carcinoma versus metastatic disease from known anal neoplasm. 2. No additional foci of increased uptake suspicious for metastatic disease. Mild nonspecific increased uptake is identified at the level of the anus. 3.  Emphysema (ICD10-J43.9).  ASSESSMENT & PLAN:  Jeremiah [name] is a 48 y.o. male with   1.  Right upper lobe lung nodule, along with right hilar adenopathy, highly suspicious for malignancy -His recent CT chest, abdomen and pelvis from his ED visit on 05/22/2018 showed a 1.7 cm nodule in the right upper lobe, and a 2 cm lymph node in the right hilar, highly suspicious for malignancy, likely metastatic from his previous anal cancer, although primary lung cancer is  certainly possible, given his heavy smoking history. -His PET from 06/08/18 shows 1.7cm RUL nodule concerning for malignancy or metastatic disease, no hypermetabolic hilar or mediastinal adenopathy, no evidence of other metastasis.  -We discussed his PET scan findings with pt in detail.  -We discussed his case in tumor board. His nodule is in a difficult position to biopsy. Given the high suspicion for cancer, surgery for resection was recommended. He is agreeable. Plan to proceed with surgery and the lymph node biopsy by [name] on 06/23/18.  -I discussed the possibility of his surgical pathology showing cancer, primary or recurrence of anal cancer.  If this is metastatic adeno cancer, I do not plan to give adjuvant chemotherapy given the lack of other metastasis.  If this is a primary lung cancer, with positive lymph node, I would consider adjuvant chemotherapy. -Lab reviewed, CBC WNL except WBC at 3.1, PLT at 108K, CMP WNL. I discussed his thrombocytopenia is new, possible related to his HIV.  He has mild risk of  bleeding but should be able to proceed with surgery. Will monitor closely.  -f/u in 1 month   2. History of anal cancer  -diagnosed in November 2018, and was treated with concurrent chemoradiation in [place]. We will get his outside medical records -Latest 05/2018 CT of abdomen and pelvis was negative for recurrence except RUL lung nodule. -Continue Surveillance   3. HIV -Controlled, he is on [place], f/u with ID Dr. Megan Salon   4.  Mild leukopenia and thrombocytopenia  -Probably related to his HIV -Overall mild, continue monitoring.  5. Social issues -He has limited income, no insurance  -He has been referred to our SW, and Hospital doctor  6. Smoking Cessation  -He has cut back on smoking, but I strongly encouraged him to completely quit, given cancer in his lung and overall health.  -He is willing to try     Plan  -He is scheduled to have right upper lobe  surgical resection with noted dissection by [name] on June 23, 2018  -lab and f/u in 4 weeks     No problem-specific Assessment & Plan notes found for this encounter.   No orders of the defined types were placed in this encounter.  All questions were answered. The patient knows to call the clinic with any problems, questions or concerns. No barriers to learning was detected. I spent 15 minutes counseling the patient face to face. The total time spent in the appointment was 20 minutes and more than 50% was on counseling and review of test results     Truitt Merle, MD 06/18/2018   I, Joslyn Devon, am acting as scribe for Truitt Merle, MD.   I have reviewed the above documentation for accuracy and completeness, and I agree with the above.

## 2018-06-17 ENCOUNTER — Other Ambulatory Visit: Payer: Self-pay | Admitting: *Deleted

## 2018-06-17 ENCOUNTER — Other Ambulatory Visit: Payer: Self-pay

## 2018-06-17 DIAGNOSIS — C20 Malignant neoplasm of rectum: Secondary | ICD-10-CM

## 2018-06-17 NOTE — Progress Notes (Signed)
The proposed treatment discussed in cancer conference 06/17/2018 is for discussion purpose only and is not a binding recommendation.  The patient was not physically examined nor present for their treatment options.  Therefore, final treatment plans cannot be decided.  

## 2018-06-18 ENCOUNTER — Telehealth: Payer: Self-pay | Admitting: Hematology

## 2018-06-18 ENCOUNTER — Inpatient Hospital Stay: Payer: Medicaid Other

## 2018-06-18 ENCOUNTER — Encounter: Payer: Self-pay | Admitting: Hematology

## 2018-06-18 ENCOUNTER — Inpatient Hospital Stay (HOSPITAL_BASED_OUTPATIENT_CLINIC_OR_DEPARTMENT_OTHER): Payer: Medicaid Other | Admitting: Hematology

## 2018-06-18 VITALS — BP 115/82 | HR 65 | Temp 98.2°F | Resp 17 | Ht 71.0 in | Wt 157.9 lb

## 2018-06-18 DIAGNOSIS — R911 Solitary pulmonary nodule: Secondary | ICD-10-CM

## 2018-06-18 DIAGNOSIS — R918 Other nonspecific abnormal finding of lung field: Secondary | ICD-10-CM | POA: Diagnosis not present

## 2018-06-18 DIAGNOSIS — D72819 Decreased white blood cell count, unspecified: Secondary | ICD-10-CM

## 2018-06-18 DIAGNOSIS — Z85048 Personal history of other malignant neoplasm of rectum, rectosigmoid junction, and anus: Secondary | ICD-10-CM

## 2018-06-18 DIAGNOSIS — R590 Localized enlarged lymph nodes: Secondary | ICD-10-CM | POA: Diagnosis not present

## 2018-06-18 DIAGNOSIS — C20 Malignant neoplasm of rectum: Secondary | ICD-10-CM

## 2018-06-18 DIAGNOSIS — D6959 Other secondary thrombocytopenia: Secondary | ICD-10-CM

## 2018-06-18 DIAGNOSIS — Z21 Asymptomatic human immunodeficiency virus [HIV] infection status: Secondary | ICD-10-CM

## 2018-06-18 DIAGNOSIS — Z95828 Presence of other vascular implants and grafts: Secondary | ICD-10-CM

## 2018-06-18 LAB — CMP (CANCER CENTER ONLY)
ALT: 35 U/L (ref 0–44)
AST: 41 U/L (ref 15–41)
Albumin: 3.9 g/dL (ref 3.5–5.0)
Alkaline Phosphatase: 55 U/L (ref 38–126)
Anion gap: 9 (ref 5–15)
BUN: 13 mg/dL (ref 6–20)
CO2: 24 mmol/L (ref 22–32)
Calcium: 8.7 mg/dL — ABNORMAL LOW (ref 8.9–10.3)
Chloride: 107 mmol/L (ref 98–111)
Creatinine: 1.11 mg/dL (ref 0.61–1.24)
GFR, Est AFR Am: >60 mL/min (ref >60)
GFR, Estimated: >60 mL/min (ref >60)
Glucose, Bld: 152 mg/dL — ABNORMAL HIGH (ref 70–99)
Potassium: 3.8 mmol/L (ref 3.5–5.1)
Sodium: 140 mmol/L (ref 135–145)
Total Bilirubin: 1 mg/dL (ref 0.3–1.2)
Total Protein: 6.7 g/dL (ref 6.5–8.1)

## 2018-06-18 LAB — CBC WITH DIFFERENTIAL (CANCER CENTER ONLY)
Abs Immature Granulocytes: 0.02 10*3/uL (ref 0.00–0.07)
Basophils Absolute: 0 10*3/uL (ref 0.0–0.1)
Basophils Relative: 1 %
Eosinophils Absolute: 0.1 10*3/uL (ref 0.0–0.5)
Eosinophils Relative: 3 %
HCT: 37.6 % — ABNORMAL LOW (ref 39.0–52.0)
Hemoglobin: 13.2 g/dL (ref 13.0–17.0)
Immature Granulocytes: 1 %
Lymphocytes Relative: 23 %
Lymphs Abs: 0.7 10*3/uL (ref 0.7–4.0)
MCH: 36.5 pg — ABNORMAL HIGH (ref 26.0–34.0)
MCHC: 35.1 g/dL (ref 30.0–36.0)
MCV: 103.9 fL — ABNORMAL HIGH (ref 80.0–100.0)
Monocytes Absolute: 0.4 10*3/uL (ref 0.1–1.0)
Monocytes Relative: 12 %
NRBC: 0 % (ref 0.0–0.2)
Neutro Abs: 1.9 10*3/uL (ref 1.7–7.7)
Neutrophils Relative %: 60 %
Platelet Count: 108 10*3/uL — ABNORMAL LOW (ref 150–400)
RBC: 3.62 MIL/uL — AB (ref 4.22–5.81)
RDW: 11.6 % (ref 11.5–15.5)
WBC: 3.1 10*3/uL — AB (ref 4.0–10.5)

## 2018-06-18 MED ORDER — HEPARIN SOD (PORK) LOCK FLUSH 100 UNIT/ML IV SOLN
500.0000 [IU] | Freq: Once | INTRAVENOUS | Status: AC
  Administered 2018-06-18: 500 [IU] via INTRAVENOUS
  Filled 2018-06-18: qty 5

## 2018-06-18 MED ORDER — SODIUM CHLORIDE 0.9% FLUSH
10.0000 mL | INTRAVENOUS | Status: DC | PRN
  Administered 2018-06-18: 10 mL via INTRAVENOUS
  Filled 2018-06-18: qty 10

## 2018-06-18 NOTE — Pre-Procedure Instructions (Signed)
Beryle Zeitz  06/18/2018      [id number] [address], [address] [place] [address] Phone: [phone] Fax: [phone]  [facility] [phone] - [address], [place] [place] [address] [place] [address] Phone: [phone] Fax: [place], [place] [address] [place] [address] Phone: [phone] Fax: [phone]    Your procedure is scheduled on March 4  Report to [place] at [place].M.  Call this number if you have problems the morning of surgery:  [phone]   Remember:  Do not eat or drink after midnight.     Take these medicines the morning of surgery with A SIP OF WATER  albuterol (PROVENTIL HFA;VENTOLIN HFA) BIKTARVY  7 days prior to surgery STOP taking any Aspirin (unless otherwise instructed by your surgeon), Aleve, Naproxen, Ibuprofen, Motrin, Advil, Goody's, BC's, all herbal medications, fish oil, and all vitamins.    Do not wear jewelry  Do not wear lotions, powders, or cologne, or deodorant.   Men may shave face and neck.  Do not bring valuables to the hospital.  [facility] is not responsible for any belongings or valuables.  Contacts, dentures or bridgework may not be worn into surgery.  Leave your suitcase in the car.  After surgery it may be brought to your room.  For patients admitted to the hospital, discharge time will be determined by your treatment team.  Patients discharged the day of surgery will not be allowed to drive home.    Special instructions:   [place]- Preparing For Surgery  Before surgery, you can play an important role. Because skin is not sterile, your skin needs to be as free of germs as possible. You can reduce the number of germs on your skin by washing with  CHG (chlorahexidine gluconate) Soap before surgery.  CHG is an antiseptic cleaner which kills germs and bonds with the skin to continue killing germs even after washing.    Oral Hygiene is also important to reduce your risk of infection.  Remember - BRUSH YOUR TEETH THE MORNING OF SURGERY WITH YOUR REGULAR TOOTHPASTE  Please do not use if you have an allergy to CHG or antibacterial soaps. If your skin becomes reddened/irritated stop using the CHG.  Do not shave (including legs and underarms) for at least 48 hours prior to first CHG shower. It is OK to shave your face.  Please follow these instructions carefully.   1. Shower the NIGHT BEFORE SURGERY and the MORNING OF SURGERY with CHG.   2. If you chose to wash your hair, wash your hair first as usual with your normal shampoo.  3. After you shampoo, rinse your hair and body thoroughly to remove the shampoo.  4. Use CHG as you would any other liquid soap. You can apply CHG directly to the skin and wash gently with a scrungie or a clean washcloth.   5. Apply the CHG Soap to your body ONLY FROM THE NECK DOWN.  Do not use on open wounds or open sores. Avoid contact with your eyes, ears, mouth and genitals (private parts). Wash Face and genitals (private parts)  with your normal soap.  6. Wash thoroughly, paying special attention to the area where your surgery will  be performed.  7. Thoroughly rinse your body with warm water from the neck down.  8. DO NOT shower/wash with your normal soap after using and rinsing off the CHG Soap.  9. Pat yourself dry with a CLEAN TOWEL.  10. Wear CLEAN PAJAMAS to bed the night before surgery, wear comfortable clothes the morning of surgery  11. Place CLEAN SHEETS on your bed the night of your first shower and DO NOT SLEEP WITH PETS.    Day of Surgery:  Do not apply any deodorants/lotions.  Please wear clean clothes to the hospital/surgery center.   Remember to brush your teeth WITH YOUR REGULAR  TOOTHPASTE.    Please read over the following fact sheets that you were given.

## 2018-06-18 NOTE — Telephone Encounter (Signed)
Gave avs and calendar ° °

## 2018-06-21 ENCOUNTER — Encounter (HOSPITAL_COMMUNITY): Payer: Self-pay

## 2018-06-21 ENCOUNTER — Ambulatory Visit (HOSPITAL_COMMUNITY)
Admission: RE | Admit: 2018-06-21 | Discharge: 2018-06-21 | Disposition: A | Discharge status: Home / Self Care | Payer: Medicaid Other | Source: Ambulatory Visit | Attending: Cardiothoracic Surgery | Admitting: Cardiothoracic Surgery

## 2018-06-21 ENCOUNTER — Other Ambulatory Visit: Payer: Self-pay

## 2018-06-21 ENCOUNTER — Encounter (HOSPITAL_COMMUNITY)
Admission: RE | Admit: 2018-06-21 | Discharge: 2018-06-21 | Disposition: A | Discharge status: Home / Self Care | Payer: Medicaid Other | Source: Ambulatory Visit | Attending: Cardiothoracic Surgery | Admitting: Cardiothoracic Surgery

## 2018-06-21 DIAGNOSIS — R911 Solitary pulmonary nodule: Secondary | ICD-10-CM | POA: Diagnosis not present

## 2018-06-21 LAB — COMPREHENSIVE METABOLIC PANEL
ALT: 41 U/L (ref 0–44)
AST: 46 U/L — ABNORMAL HIGH (ref 15–41)
Albumin: 3.7 g/dL (ref 3.5–5.0)
Alkaline Phosphatase: 54 U/L (ref 38–126)
Anion gap: 9 (ref 5–15)
BUN: 14 mg/dL (ref 6–20)
CO2: 21 mmol/L — ABNORMAL LOW (ref 22–32)
Calcium: 8.7 mg/dL — ABNORMAL LOW (ref 8.9–10.3)
Chloride: 107 mmol/L (ref 98–111)
Creatinine, Ser: 1.22 mg/dL (ref 0.61–1.24)
GFR calc Af Amer: >60 mL/min (ref >60)
GFR calc non Af Amer: >60 mL/min (ref >60)
Glucose, Bld: 105 mg/dL — ABNORMAL HIGH (ref 70–99)
Potassium: 4.1 mmol/L (ref 3.5–5.1)
Sodium: 137 mmol/L (ref 135–145)
Total Bilirubin: 1 mg/dL (ref 0.3–1.2)
Total Protein: 6 g/dL — ABNORMAL LOW (ref 6.5–8.1)

## 2018-06-21 LAB — URINALYSIS, ROUTINE W REFLEX MICROSCOPIC
Bilirubin Urine: NEGATIVE
Glucose, UA: NEGATIVE mg/dL
Hgb urine dipstick: NEGATIVE
Ketones, ur: NEGATIVE mg/dL
Leukocytes,Ua: NEGATIVE
Nitrite: NEGATIVE
Protein, ur: NEGATIVE mg/dL
Specific Gravity, Urine: 1.015 (ref 1.005–1.030)
pH: 6 (ref 5.0–8.0)

## 2018-06-21 LAB — CBC
HCT: 39.4 % (ref 39.0–52.0)
Hemoglobin: 13.6 g/dL (ref 13.0–17.0)
MCH: 36.1 pg — ABNORMAL HIGH (ref 26.0–34.0)
MCHC: 34.5 g/dL (ref 30.0–36.0)
MCV: 104.5 fL — ABNORMAL HIGH (ref 80.0–100.0)
Platelets: 138 10*3/uL — ABNORMAL LOW (ref 150–400)
RBC: 3.77 MIL/uL — ABNORMAL LOW (ref 4.22–5.81)
RDW: 11.7 % (ref 11.5–15.5)
WBC: 4.1 10*3/uL (ref 4.0–10.5)
nRBC: 0 % (ref 0.0–0.2)

## 2018-06-21 LAB — SURGICAL PCR SCREEN
MRSA, PCR: NEGATIVE
Staphylococcus aureus: NEGATIVE

## 2018-06-21 LAB — BLOOD GAS, ARTERIAL
Acid-base deficit: 0.3 mmol/L (ref 0.0–2.0)
Bicarbonate: 24 mmol/L (ref 20.0–28.0)
Drawn by: 265211
FIO2: 21
O2 Saturation: 97.2 %
Patient temperature: 98.6
pCO2 arterial: 40.8 mmHg (ref 32.0–48.0)
pH, Arterial: 7.387 (ref 7.350–7.450)
pO2, Arterial: 95.3 mmHg (ref 83.0–108.0)

## 2018-06-21 LAB — PROTIME-INR
INR: 1 (ref 0.8–1.2)
Prothrombin Time: 13.3 seconds (ref 11.4–15.2)

## 2018-06-21 LAB — APTT: aPTT: 27 seconds (ref 24–36)

## 2018-06-21 LAB — TYPE AND SCREEN
ABO/RH(D): A POS
Antibody Screen: NEGATIVE

## 2018-06-21 LAB — ABO/RH: ABO/RH(D): A POS

## 2018-06-21 NOTE — Progress Notes (Signed)
PCP - Dr. Michel Bickers- Infectious Disease Cardiologist - denies  Chest x-ray - 06/21/18 EKG - 05/23/18 Stress Test - denies ECHO - denies Cardiac Cath - denies  Sleep Study - denies  Aspirin Instructions: Patient instructed to hold all Aspirin, NSAID's, herbal medications, fish oil and vitamins 7 days prior to surgery.   Anesthesia review:   Patient denies shortness of breath, fever, cough and chest pain at PAT appointment   Patient verbalized understanding of instructions that were given to them at the PAT appointment. Patient was also instructed that they will need to review over the PAT instructions again at home before surgery.

## 2018-06-23 ENCOUNTER — Encounter (HOSPITAL_COMMUNITY): Payer: Self-pay | Admitting: Anesthesiology

## 2018-06-23 ENCOUNTER — Inpatient Hospital Stay (HOSPITAL_COMMUNITY): Payer: Medicaid Other | Admitting: Anesthesiology

## 2018-06-23 ENCOUNTER — Inpatient Hospital Stay (HOSPITAL_COMMUNITY)
Admission: RE | Admit: 2018-06-23 | Discharge: 2018-06-28 | DRG: 164 | Disposition: A | Discharge status: Home / Self Care | Payer: Medicaid Other | Attending: Cardiothoracic Surgery | Admitting: Cardiothoracic Surgery

## 2018-06-23 ENCOUNTER — Other Ambulatory Visit: Payer: Self-pay

## 2018-06-23 ENCOUNTER — Inpatient Hospital Stay (HOSPITAL_COMMUNITY): Payer: Medicaid Other

## 2018-06-23 ENCOUNTER — Encounter (HOSPITAL_COMMUNITY)
Admission: RE | Disposition: A | Discharge status: Home / Self Care | Payer: Self-pay | Source: Home / Self Care | Attending: Cardiothoracic Surgery

## 2018-06-23 DIAGNOSIS — Z923 Personal history of irradiation: Secondary | ICD-10-CM

## 2018-06-23 DIAGNOSIS — Z9221 Personal history of antineoplastic chemotherapy: Secondary | ICD-10-CM | POA: Diagnosis not present

## 2018-06-23 DIAGNOSIS — Z85048 Personal history of other malignant neoplasm of rectum, rectosigmoid junction, and anus: Secondary | ICD-10-CM | POA: Diagnosis not present

## 2018-06-23 DIAGNOSIS — Z882 Allergy status to sulfonamides status: Secondary | ICD-10-CM

## 2018-06-23 DIAGNOSIS — K649 Unspecified hemorrhoids: Secondary | ICD-10-CM | POA: Diagnosis not present

## 2018-06-23 DIAGNOSIS — I10 Essential (primary) hypertension: Secondary | ICD-10-CM | POA: Diagnosis present

## 2018-06-23 DIAGNOSIS — S62318A Displaced fracture of base of other metacarpal bone, initial encounter for closed fracture: Secondary | ICD-10-CM | POA: Diagnosis present

## 2018-06-23 DIAGNOSIS — Z21 Asymptomatic human immunodeficiency virus [HIV] infection status: Secondary | ICD-10-CM | POA: Diagnosis present

## 2018-06-23 DIAGNOSIS — R222 Localized swelling, mass and lump, trunk: Secondary | ICD-10-CM | POA: Diagnosis not present

## 2018-06-23 DIAGNOSIS — D62 Acute posthemorrhagic anemia: Secondary | ICD-10-CM | POA: Diagnosis present

## 2018-06-23 DIAGNOSIS — J8410 Pulmonary fibrosis, unspecified: Secondary | ICD-10-CM | POA: Diagnosis present

## 2018-06-23 DIAGNOSIS — R911 Solitary pulmonary nodule: Secondary | ICD-10-CM | POA: Diagnosis not present

## 2018-06-23 DIAGNOSIS — Z902 Acquired absence of lung [part of]: Secondary | ICD-10-CM

## 2018-06-23 DIAGNOSIS — F17210 Nicotine dependence, cigarettes, uncomplicated: Secondary | ICD-10-CM | POA: Diagnosis present

## 2018-06-23 DIAGNOSIS — J430 Unilateral pulmonary emphysema [MacLeod's syndrome]: Secondary | ICD-10-CM | POA: Diagnosis present

## 2018-06-23 DIAGNOSIS — J939 Pneumothorax, unspecified: Secondary | ICD-10-CM

## 2018-06-23 DIAGNOSIS — F329 Major depressive disorder, single episode, unspecified: Secondary | ICD-10-CM | POA: Diagnosis present

## 2018-06-23 DIAGNOSIS — Z9079 Acquired absence of other genital organ(s): Secondary | ICD-10-CM | POA: Diagnosis not present

## 2018-06-23 DIAGNOSIS — E785 Hyperlipidemia, unspecified: Secondary | ICD-10-CM | POA: Diagnosis not present

## 2018-06-23 DIAGNOSIS — Z79899 Other long term (current) drug therapy: Secondary | ICD-10-CM

## 2018-06-23 DIAGNOSIS — J9382 Other air leak: Secondary | ICD-10-CM | POA: Diagnosis present

## 2018-06-23 DIAGNOSIS — D696 Thrombocytopenia, unspecified: Secondary | ICD-10-CM | POA: Diagnosis present

## 2018-06-23 DIAGNOSIS — R001 Bradycardia, unspecified: Secondary | ICD-10-CM | POA: Diagnosis present

## 2018-06-23 DIAGNOSIS — Z886 Allergy status to analgesic agent status: Secondary | ICD-10-CM | POA: Diagnosis not present

## 2018-06-23 HISTORY — PX: VIDEO BRONCHOSCOPY: SHX5072

## 2018-06-23 HISTORY — PX: VIDEO ASSISTED THORACOSCOPY (VATS)/WEDGE RESECTION: SHX6174

## 2018-06-23 LAB — GLUCOSE, CAPILLARY
Glucose-Capillary: 82 mg/dL (ref 70–99)
Glucose-Capillary: 172 mg/dL — ABNORMAL HIGH (ref 70–99)
Glucose-Capillary: 154 mg/dL — ABNORMAL HIGH (ref 70–99)

## 2018-06-23 SURGERY — BRONCHOSCOPY, VIDEO-ASSISTED
Anesthesia: General | Site: Chest | Laterality: Right

## 2018-06-23 MED ORDER — PHENYLEPHRINE 40 MCG/ML (10ML) SYRINGE FOR IV PUSH (FOR BLOOD PRESSURE SUPPORT)
PREFILLED_SYRINGE | INTRAVENOUS | Status: AC
  Filled 2018-06-23: qty 10

## 2018-06-23 MED ORDER — MIDAZOLAM HCL 5 MG/5ML IJ SOLN
INTRAMUSCULAR | Status: DC | PRN
  Administered 2018-06-23 (×4): 1 mg via INTRAVENOUS

## 2018-06-23 MED ORDER — SODIUM CHLORIDE 0.9% FLUSH: 9.0000 mL | INTRAVENOUS | Status: DC | PRN

## 2018-06-23 MED ORDER — ACETAMINOPHEN 500 MG PO TABS
1000.0000 mg | ORAL_TABLET | Freq: Four times a day (QID) | ORAL | Status: DC
  Administered 2018-06-23 – 2018-06-28 (×17): 1000 mg via ORAL
  Filled 2018-06-23 (×17): qty 2

## 2018-06-23 MED ORDER — ENOXAPARIN SODIUM 40 MG/0.4ML ~~LOC~~ SOLN
40.0000 mg | SUBCUTANEOUS | Status: DC
  Administered 2018-06-24 – 2018-06-28 (×5): 40 mg via SUBCUTANEOUS
  Filled 2018-06-23 (×5): qty 0.4

## 2018-06-23 MED ORDER — MEPERIDINE HCL 50 MG/ML IJ SOLN: 6.2500 mg | INTRAMUSCULAR | Status: DC | PRN

## 2018-06-23 MED ORDER — CEFAZOLIN SODIUM 1 G IJ SOLR
INTRAMUSCULAR | Status: AC
  Filled 2018-06-23: qty 20

## 2018-06-23 MED ORDER — DEXAMETHASONE SODIUM PHOSPHATE 10 MG/ML IJ SOLN
INTRAMUSCULAR | Status: DC | PRN
  Administered 2018-06-23: 10 mg via INTRAVENOUS

## 2018-06-23 MED ORDER — INSULIN ASPART 100 UNIT/ML ~~LOC~~ SOLN
0.0000 [IU] | Freq: Four times a day (QID) | SUBCUTANEOUS | Status: DC
  Administered 2018-06-23: 4 [IU] via SUBCUTANEOUS
  Administered 2018-06-24: 2 [IU] via SUBCUTANEOUS
  Filled 2018-06-23: qty 0.24

## 2018-06-23 MED ORDER — PROPOFOL 10 MG/ML IV BOLUS
INTRAVENOUS | Status: AC
  Filled 2018-06-23: qty 20

## 2018-06-23 MED ORDER — ONDANSETRON HCL 4 MG/2ML IJ SOLN
INTRAMUSCULAR | Status: AC
  Filled 2018-06-23: qty 4

## 2018-06-23 MED ORDER — DIPHENHYDRAMINE HCL 50 MG/ML IJ SOLN
25.0000 mg | Freq: Once | INTRAMUSCULAR | Status: AC
  Administered 2018-06-23: 25 mg via INTRAVENOUS
  Filled 2018-06-23: qty 0.5

## 2018-06-23 MED ORDER — BISACODYL 5 MG PO TBEC
10.0000 mg | DELAYED_RELEASE_TABLET | Freq: Every day | ORAL | Status: DC
  Administered 2018-06-23 – 2018-06-26 (×4): 10 mg via ORAL
  Filled 2018-06-23 (×6): qty 2

## 2018-06-23 MED ORDER — SUGAMMADEX SODIUM 200 MG/2ML IV SOLN
INTRAVENOUS | Status: DC | PRN
  Administered 2018-06-23: 100 mg via INTRAVENOUS

## 2018-06-23 MED ORDER — METOPROLOL TARTRATE 25 MG PO TABS
25.0000 mg | ORAL_TABLET | Freq: Two times a day (BID) | ORAL | Status: DC
  Filled 2018-06-23: qty 1

## 2018-06-23 MED ORDER — WHITE PETROLATUM EX OINT
TOPICAL_OINTMENT | CUTANEOUS | Status: AC
  Administered 2018-06-23: 19:00:00
  Filled 2018-06-23: qty 28.35

## 2018-06-23 MED ORDER — GLYCOPYRROLATE PF 0.2 MG/ML IJ SOSY
PREFILLED_SYRINGE | INTRAMUSCULAR | Status: DC | PRN
  Administered 2018-06-23: .2 mg via INTRAVENOUS

## 2018-06-23 MED ORDER — CEFAZOLIN SODIUM-DEXTROSE 2-4 GM/100ML-% IV SOLN
2.0000 g | INTRAVENOUS | Status: AC
  Administered 2018-06-23: 2 g via INTRAVENOUS

## 2018-06-23 MED ORDER — MIDAZOLAM HCL 2 MG/2ML IJ SOLN
INTRAMUSCULAR | Status: AC
  Filled 2018-06-23: qty 2

## 2018-06-23 MED ORDER — PROPOFOL 10 MG/ML IV BOLUS
INTRAVENOUS | Status: DC | PRN
  Administered 2018-06-23: 120 mg via INTRAVENOUS

## 2018-06-23 MED ORDER — SENNOSIDES-DOCUSATE SODIUM 8.6-50 MG PO TABS
1.0000 | ORAL_TABLET | Freq: Every day | ORAL | Status: DC
  Administered 2018-06-23 – 2018-06-25 (×3): 1 via ORAL
  Filled 2018-06-23 (×4): qty 1

## 2018-06-23 MED ORDER — BICTEGRAVIR-EMTRICITAB-TENOFOV 50-200-25 MG PO TABS
1.0000 | ORAL_TABLET | Freq: Every day | ORAL | Status: DC
  Administered 2018-06-24 – 2018-06-28 (×5): 1 via ORAL
  Filled 2018-06-23 (×5): qty 1

## 2018-06-23 MED ORDER — HYDROMORPHONE HCL 1 MG/ML IJ SOLN
INTRAMUSCULAR | Status: AC
  Filled 2018-06-23: qty 2

## 2018-06-23 MED ORDER — DEXAMETHASONE SODIUM PHOSPHATE 10 MG/ML IJ SOLN
INTRAMUSCULAR | Status: AC
  Filled 2018-06-23: qty 1

## 2018-06-23 MED ORDER — CLONIDINE HCL 0.2 MG PO TABS
0.2000 mg | ORAL_TABLET | Freq: Two times a day (BID) | ORAL | Status: DC
  Administered 2018-06-23 – 2018-06-28 (×5): 0.2 mg via ORAL
  Filled 2018-06-23 (×8): qty 1

## 2018-06-23 MED ORDER — FENTANYL 40 MCG/ML IV SOLN
INTRAVENOUS | Status: DC
  Administered 2018-06-23: 13:00:00 via INTRAVENOUS
  Administered 2018-06-23: 195 ug via INTRAVENOUS
  Administered 2018-06-24: 23:00:00 via INTRAVENOUS
  Administered 2018-06-24: 15 ug via INTRAVENOUS
  Administered 2018-06-24: 120 ug via INTRAVENOUS
  Administered 2018-06-24: 60 ug via INTRAVENOUS
  Administered 2018-06-24: 75 ug via INTRAVENOUS
  Administered 2018-06-24: 60 ug via INTRAVENOUS
  Administered 2018-06-25: 420 ug via INTRAVENOUS
  Administered 2018-06-25: 195 ug via INTRAVENOUS
  Administered 2018-06-26: 120 ug via INTRAVENOUS
  Administered 2018-06-26: 210 ug via INTRAVENOUS
  Filled 2018-06-23: qty 1000
  Filled 2018-06-23 (×3): qty 25

## 2018-06-23 MED ORDER — LACTATED RINGERS IV SOLN
INTRAVENOUS | Status: DC | PRN
  Administered 2018-06-23 (×2): via INTRAVENOUS

## 2018-06-23 MED ORDER — DIPHENHYDRAMINE HCL 50 MG/ML IJ SOLN
INTRAMUSCULAR | Status: AC
  Filled 2018-06-23: qty 1

## 2018-06-23 MED ORDER — ROCURONIUM BROMIDE 50 MG/5ML IV SOSY
PREFILLED_SYRINGE | INTRAVENOUS | Status: DC | PRN
  Administered 2018-06-23: 70 mg via INTRAVENOUS
  Administered 2018-06-23: 20 mg via INTRAVENOUS
  Administered 2018-06-23: 30 mg via INTRAVENOUS

## 2018-06-23 MED ORDER — LIDOCAINE 2% (20 MG/ML) 5 ML SYRINGE
INTRAMUSCULAR | Status: AC
  Filled 2018-06-23: qty 5

## 2018-06-23 MED ORDER — NALOXONE HCL 0.4 MG/ML IJ SOLN: 0.4000 mg | INTRAMUSCULAR | Status: DC | PRN

## 2018-06-23 MED ORDER — ONDANSETRON HCL 4 MG/2ML IJ SOLN: 4.0000 mg | Freq: Four times a day (QID) | INTRAMUSCULAR | Status: DC | PRN

## 2018-06-23 MED ORDER — SODIUM CHLORIDE 0.9 % IV SOLN
INTRAVENOUS | Status: DC | PRN
  Administered 2018-06-23: 40 ug/min via INTRAVENOUS

## 2018-06-23 MED ORDER — ACETAMINOPHEN 160 MG/5ML PO SOLN: 1000.0000 mg | Freq: Four times a day (QID) | ORAL | Status: DC

## 2018-06-23 MED ORDER — FENTANYL CITRATE (PF) 250 MCG/5ML IJ SOLN
INTRAMUSCULAR | Status: DC | PRN
  Administered 2018-06-23: 150 ug via INTRAVENOUS
  Administered 2018-06-23 (×2): 50 ug via INTRAVENOUS

## 2018-06-23 MED ORDER — POTASSIUM CHLORIDE 10 MEQ/50ML IV SOLN: 10.0000 meq | Freq: Every day | INTRAVENOUS | Status: DC | PRN

## 2018-06-23 MED ORDER — ONDANSETRON HCL 4 MG/2ML IJ SOLN: 4.0000 mg | Freq: Once | INTRAMUSCULAR | Status: DC | PRN

## 2018-06-23 MED ORDER — PHENYLEPHRINE 40 MCG/ML (10ML) SYRINGE FOR IV PUSH (FOR BLOOD PRESSURE SUPPORT)
PREFILLED_SYRINGE | INTRAVENOUS | Status: DC | PRN
  Administered 2018-06-23: 40 ug via INTRAVENOUS
  Administered 2018-06-23 (×2): 80 ug via INTRAVENOUS
  Administered 2018-06-23: 120 ug via INTRAVENOUS

## 2018-06-23 MED ORDER — CEFAZOLIN SODIUM-DEXTROSE 2-4 GM/100ML-% IV SOLN
2.0000 g | Freq: Three times a day (TID) | INTRAVENOUS | Status: AC
  Administered 2018-06-23 – 2018-06-24 (×2): 2 g via INTRAVENOUS
  Filled 2018-06-23 (×2): qty 100

## 2018-06-23 MED ORDER — LEVALBUTEROL HCL 0.63 MG/3ML IN NEBU: 0.6300 mg | INHALATION_SOLUTION | Freq: Three times a day (TID) | RESPIRATORY_TRACT | Status: DC | PRN

## 2018-06-23 MED ORDER — ROCURONIUM BROMIDE 50 MG/5ML IV SOSY
PREFILLED_SYRINGE | INTRAVENOUS | Status: AC
  Filled 2018-06-23: qty 5

## 2018-06-23 MED ORDER — BUPIVACAINE LIPOSOME 1.3 % IJ SUSP
20.0000 mL | INTRAMUSCULAR | Status: AC
  Administered 2018-06-23: 20 mL
  Filled 2018-06-23: qty 20

## 2018-06-23 MED ORDER — BUPIVACAINE HCL (PF) 0.5 % IJ SOLN
INTRAMUSCULAR | Status: DC | PRN
  Administered 2018-06-23: 30 mL

## 2018-06-23 MED ORDER — OXYCODONE HCL 5 MG PO TABS
5.0000 mg | ORAL_TABLET | ORAL | Status: DC | PRN
  Administered 2018-06-26 – 2018-06-28 (×7): 5 mg via ORAL
  Filled 2018-06-23 (×7): qty 1

## 2018-06-23 MED ORDER — DIPHENHYDRAMINE HCL 50 MG/ML IJ SOLN
12.5000 mg | Freq: Four times a day (QID) | INTRAMUSCULAR | Status: DC | PRN
  Administered 2018-06-24 (×2): 12.5 mg via INTRAVENOUS
  Filled 2018-06-23 (×2): qty 1

## 2018-06-23 MED ORDER — GLYCOPYRROLATE PF 0.2 MG/ML IJ SOSY
PREFILLED_SYRINGE | INTRAMUSCULAR | Status: AC
  Filled 2018-06-23: qty 1

## 2018-06-23 MED ORDER — LIDOCAINE 2% (20 MG/ML) 5 ML SYRINGE
INTRAMUSCULAR | Status: DC | PRN
  Administered 2018-06-23: 80 mg via INTRAVENOUS

## 2018-06-23 MED ORDER — ONDANSETRON HCL 4 MG/2ML IJ SOLN
INTRAMUSCULAR | Status: DC | PRN
  Administered 2018-06-23: 4 mg via INTRAVENOUS

## 2018-06-23 MED ORDER — LACTATED RINGERS IV SOLN
INTRAVENOUS | Status: DC | PRN
  Administered 2018-06-23: 08:00:00 via INTRAVENOUS

## 2018-06-23 MED ORDER — HYDROMORPHONE HCL 1 MG/ML IJ SOLN
0.2500 mg | INTRAMUSCULAR | Status: DC | PRN
  Administered 2018-06-23 (×4): 0.5 mg via INTRAVENOUS

## 2018-06-23 MED ORDER — 0.9 % SODIUM CHLORIDE (POUR BTL) OPTIME
TOPICAL | Status: DC | PRN
  Administered 2018-06-23: 2000 mL

## 2018-06-23 MED ORDER — METOPROLOL TARTRATE 25 MG PO TABS
25.0000 mg | ORAL_TABLET | Freq: Once | ORAL | Status: AC
  Administered 2018-06-23: 25 mg via ORAL
  Filled 2018-06-23: qty 1

## 2018-06-23 MED ORDER — SODIUM CHLORIDE (PF) 0.9 % IJ SOLN
INTRAMUSCULAR | Status: DC | PRN
  Administered 2018-06-23: 50 mL via INTRAVENOUS

## 2018-06-23 MED ORDER — DIPHENHYDRAMINE HCL 12.5 MG/5ML PO ELIX
12.5000 mg | ORAL_SOLUTION | Freq: Four times a day (QID) | ORAL | Status: DC | PRN
  Administered 2018-06-25 (×2): 12.5 mg via ORAL
  Filled 2018-06-23 (×3): qty 5

## 2018-06-23 MED ORDER — POTASSIUM CHLORIDE IN NACL 20-0.9 MEQ/L-% IV SOLN
INTRAVENOUS | Status: DC
  Administered 2018-06-23 – 2018-06-24 (×2): 100 mL/h via INTRAVENOUS
  Filled 2018-06-23 (×3): qty 1000

## 2018-06-23 MED ORDER — CLONIDINE HCL 0.2 MG PO TABS
0.2000 mg | ORAL_TABLET | Freq: Once | ORAL | Status: AC
  Administered 2018-06-23: 0.2 mg via ORAL
  Filled 2018-06-23: qty 1

## 2018-06-23 MED ORDER — BUPIVACAINE HCL (PF) 0.5 % IJ SOLN
INTRAMUSCULAR | Status: AC
  Filled 2018-06-23: qty 30

## 2018-06-23 MED ORDER — FENTANYL CITRATE (PF) 250 MCG/5ML IJ SOLN
INTRAMUSCULAR | Status: AC
  Filled 2018-06-23: qty 5

## 2018-06-23 MED ORDER — TRAMADOL HCL 50 MG PO TABS
50.0000 mg | ORAL_TABLET | Freq: Four times a day (QID) | ORAL | Status: DC | PRN
  Administered 2018-06-27: 50 mg via ORAL
  Filled 2018-06-23 (×2): qty 1

## 2018-06-23 SURGICAL SUPPLY — 92 items
ADAPTER VALVE BIOPSY EBUS (MISCELLANEOUS) IMPLANT
ADH SKN CLS APL DERMABOND .7 (GAUZE/BANDAGES/DRESSINGS) ×1
ADPTR VALVE BIOPSY EBUS (MISCELLANEOUS)
APL SRG 7X2 LUM MLBL SLNT (VASCULAR PRODUCTS)
APPLICATOR TIP COSEAL (VASCULAR PRODUCTS) IMPLANT
APPLICATOR TIP EXT COSEAL (VASCULAR PRODUCTS) IMPLANT
BRUSH CYTOL CELLEBRITY 1.5X140 (MISCELLANEOUS) IMPLANT
CANISTER SUCT 3000ML PPV (MISCELLANEOUS) ×3 IMPLANT
CATH THORACIC 28FR (CATHETERS) ×3 IMPLANT
CATH THORACIC 36FR (CATHETERS) IMPLANT
CATH THORACIC 36FR RT ANG (CATHETERS) IMPLANT
CLIP VESOCCLUDE MED 6/CT (CLIP) IMPLANT
CONN ST 1/4X3/8  BEN (MISCELLANEOUS) ×1
CONN ST 1/4X3/8 BEN (MISCELLANEOUS) ×2 IMPLANT
CONT SPEC 4OZ CLIKSEAL STRL BL (MISCELLANEOUS) ×27 IMPLANT
COVER BACK TABLE 60X90IN (DRAPES) IMPLANT
COVER WAND RF STERILE (DRAPES) ×3 IMPLANT
CUTTER ECHEON FLEX ENDO 45 340 (ENDOMECHANICALS) ×3 IMPLANT
DERMABOND ADVANCED (GAUZE/BANDAGES/DRESSINGS) ×1
DERMABOND ADVANCED .7 DNX12 (GAUZE/BANDAGES/DRESSINGS) ×2 IMPLANT
DISSECTOR BLUNT TIP ENDO 5MM (MISCELLANEOUS) IMPLANT
DRAIN CHANNEL 28F RND 3/8 FF (WOUND CARE) ×3 IMPLANT
DRAIN CHANNEL 32F RND 10.7 FF (WOUND CARE) IMPLANT
DRAPE HALF SHEET 40X57 (DRAPES) ×3 IMPLANT
DRAPE LAPAROSCOPIC ABDOMINAL (DRAPES) ×3 IMPLANT
DRILL BIT 7/64X5 (BIT) IMPLANT
ELECT BLADE 4.0 EZ CLEAN MEGAD (MISCELLANEOUS) ×3
ELECT BLADE 6.5 EXT (BLADE) ×3 IMPLANT
ELECT REM PT RETURN 9FT ADLT (ELECTROSURGICAL) ×3
ELECTRODE BLDE 4.0 EZ CLN MEGD (MISCELLANEOUS) ×2 IMPLANT
ELECTRODE REM PT RTRN 9FT ADLT (ELECTROSURGICAL) ×2 IMPLANT
FORCEPS BIOP RJ4 1.8 (CUTTING FORCEPS) IMPLANT
GAUZE SPONGE 4X4 12PLY STRL (GAUZE/BANDAGES/DRESSINGS) ×6 IMPLANT
GAUZE SPONGE 4X4 12PLY STRL LF (GAUZE/BANDAGES/DRESSINGS) ×3 IMPLANT
GLOVE BIO SURGEON STRL SZ 6.5 (GLOVE) ×9 IMPLANT
GOWN STRL REUS W/ TWL LRG LVL3 (GOWN DISPOSABLE) ×10 IMPLANT
GOWN STRL REUS W/TWL LRG LVL3 (GOWN DISPOSABLE) ×5
KIT BASIN OR (CUSTOM PROCEDURE TRAY) ×3 IMPLANT
KIT CLEAN ENDO COMPLIANCE (KITS) ×3 IMPLANT
KIT SUCTION CATH 14FR (SUCTIONS) ×6 IMPLANT
KIT TURNOVER KIT B (KITS) ×3 IMPLANT
MARKER SKIN DUAL TIP RULER LAB (MISCELLANEOUS) IMPLANT
NEEDLE SPNL 18GX3.5 QUINCKE PK (NEEDLE) ×3 IMPLANT
NS IRRIG 1000ML POUR BTL (IV SOLUTION) ×9 IMPLANT
OIL SILICONE PENTAX (PARTS (SERVICE/REPAIRS)) ×3 IMPLANT
PACK CHEST (CUSTOM PROCEDURE TRAY) ×3 IMPLANT
PAD ARMBOARD 7.5X6 YLW CONV (MISCELLANEOUS) ×6 IMPLANT
PASSER SUT SWANSON 36MM LOOP (INSTRUMENTS) IMPLANT
SCISSORS LAP 5X35 DISP (ENDOMECHANICALS) ×3 IMPLANT
SEALANT SURG COSEAL 4ML (VASCULAR PRODUCTS) IMPLANT
SEALANT SURG COSEAL 8ML (VASCULAR PRODUCTS) IMPLANT
SOLUTION ANTI FOG 6CC (MISCELLANEOUS) ×3 IMPLANT
SPONGE TONSIL TAPE 1 RFD (DISPOSABLE) IMPLANT
STAPLE RELOAD 45MM GOLD (STAPLE) ×30 IMPLANT
STAPLER ECHELON POWERED (MISCELLANEOUS) IMPLANT
STOPCOCK 4 WAY LG BORE MALE ST (IV SETS) ×3 IMPLANT
SUT PROLENE 3 0 SH DA (SUTURE) IMPLANT
SUT PROLENE 4 0 RB 1 (SUTURE)
SUT PROLENE 4-0 RB1 .5 CRCL 36 (SUTURE) IMPLANT
SUT SILK  1 MH (SUTURE) ×2
SUT SILK 1 MH (SUTURE) ×4 IMPLANT
SUT SILK 1 TIES 10X30 (SUTURE) IMPLANT
SUT SILK 2 0 SH (SUTURE) IMPLANT
SUT SILK 2 0SH CR/8 30 (SUTURE) IMPLANT
SUT STEEL 1 (SUTURE) IMPLANT
SUT VIC AB 0 CTX 18 (SUTURE) IMPLANT
SUT VIC AB 1 CTX 18 (SUTURE) ×3 IMPLANT
SUT VIC AB 1 CTX 36 (SUTURE) ×1
SUT VIC AB 1 CTX36XBRD ANBCTR (SUTURE) ×2 IMPLANT
SUT VIC AB 2-0 CTX 36 (SUTURE) ×3 IMPLANT
SUT VIC AB 3-0 SH 8-18 (SUTURE) IMPLANT
SUT VIC AB 3-0 X1 27 (SUTURE) ×3 IMPLANT
SUT VICRYL 0 UR6 27IN ABS (SUTURE) IMPLANT
SUT VICRYL 2 TP 1 (SUTURE) ×3 IMPLANT
SYR 20ML ECCENTRIC (SYRINGE) ×3 IMPLANT
SYR 5ML LL (SYRINGE) ×3 IMPLANT
SYRINGE 60CC LL (MISCELLANEOUS) ×3 IMPLANT
SYSTEM SAHARA CHEST DRAIN ATS (WOUND CARE) ×3 IMPLANT
TAPE CLOTH SURG 4X10 WHT LF (GAUZE/BANDAGES/DRESSINGS) ×3 IMPLANT
TAPE UMBILICAL COTTON 1/8X30 (MISCELLANEOUS) IMPLANT
TOWEL GREEN STERILE (TOWEL DISPOSABLE) ×3 IMPLANT
TOWEL GREEN STERILE FF (TOWEL DISPOSABLE) ×3 IMPLANT
TRAP SPECIMEN MUCOUS 40CC (MISCELLANEOUS) ×3 IMPLANT
TRAY FOLEY MTR SLVR 16FR STAT (SET/KITS/TRAYS/PACK) ×3 IMPLANT
TROCAR BLADELESS 12MM (ENDOMECHANICALS) IMPLANT
TROCAR XCEL 12X100 BLDLESS (ENDOMECHANICALS) IMPLANT
TUBE CONNECTING 20X1/4 (TUBING) ×6 IMPLANT
TUBING EXTENTION W/L.L. (IV SETS) ×3 IMPLANT
VALVE BIOPSY  SINGLE USE (MISCELLANEOUS) ×1
VALVE BIOPSY SINGLE USE (MISCELLANEOUS) ×2 IMPLANT
VALVE SUCTION BRONCHIO DISP (MISCELLANEOUS) ×3 IMPLANT
WATER STERILE IRR 1000ML POUR (IV SOLUTION) ×3 IMPLANT

## 2018-06-23 NOTE — Anesthesia Procedure Notes (Signed)
Central Venous Catheter Insertion Performed by: Oleta Mouse, MD, anesthesiologist Start/End3/07/2018 7:49 AM, 06/23/2018 7:59 AM Patient location: Pre-op. Preanesthetic checklist: patient identified, IV checked, site marked, risks and benefits discussed, surgical consent, monitors and equipment checked, pre-op evaluation, timeout performed and anesthesia consent Hand hygiene performed  and maximum sterile barriers used  PA cath was placed.Swan type:thermodilution Procedure performed without using ultrasound guided technique. Attempts: 1 Patient tolerated the procedure well with no immediate complications.

## 2018-06-23 NOTE — Brief Op Note (Addendum)
      [address]       [place] [address]             [phone]     06/23/2018  11:22 AM  PATIENT:  Jeremiah [name]  48 y.o. male  PRE-OPERATIVE DIAGNOSIS:  1. RIGHT upper lobe LUNG NODULE 2. MULTIPLE RUL BLEBS  POST-OPERATIVE DIAGNOSIS:  1. RIGHT LUNG NODULE negative for malignancy on frozen section,  2. MULTIPLE RUL BLEBS  PROCEDURE:  VIDEO BRONCHOSCOPY, RIGHT  VIDEO ASSISTED THORACOSCOPY (VATS), RIGHT UPPER LOBE WEDGE RESECTION, stapling and dissection of apical bleb, wedge resection of right upper lobe mass, lymph node dissection, and intercostal nerve block   SURGEON:  Surgeon(s) and Role:    Grace Isaac, MD - Primary  PHYSICIAN ASSISTANT: Lars Pinks PA-C  ANESTHESIA:   general  EBL:  100 mL   BLOOD ADMINISTERED:none  DRAINS: 28 French chest tube and Blade drain placed in the right pleural space   LOCAL MEDICATIONS USED:  OTHER Exparel  SPECIMEN:  Source of Specimen:  Multiple RUL blebs, wedge RUL, multiple lymph nodes  DISPOSITION OF SPECIMEN:  PATHOLOGY  COUNTS CORRECT:  YES  DICTATION: .Dragon Dictation  PLAN OF CARE: Admit to inpatient   PATIENT DISPOSITION:  PACU - hemodynamically stable.   Delay start of Pharmacological VTE agent (>24hrs) due to surgical blood loss or risk of bleeding: yes  Called patients sister with update after surgery  Jeremiah [name] [phone]

## 2018-06-23 NOTE — Progress Notes (Signed)
[place], [place] - contact [phone]

## 2018-06-23 NOTE — Anesthesia Preprocedure Evaluation (Signed)
Anesthesia Evaluation  Patient identified by MRN, date of birth, ID band Patient awake    Reviewed: Allergy & Precautions, NPO status , Patient's Chart, lab work & pertinent test results  Airway Mallampati: I  TM Distance: >3 FB Neck ROM: Full    Dental   Pulmonary Current Smoker,    Pulmonary exam normal        Cardiovascular Normal cardiovascular exam     Neuro/Psych Depression    GI/Hepatic   Endo/Other    Renal/GU      Musculoskeletal   Abdominal   Peds  Hematology   Anesthesia Other Findings   Reproductive/Obstetrics                             Anesthesia Physical Anesthesia Plan  ASA: II  Anesthesia Plan: General   Post-op Pain Management:    Induction: Intravenous  PONV Risk Score and Plan: 1 and Ondansetron and Treatment may vary due to age or medical condition  Airway Management Planned: Double Lumen EBT  Additional Equipment: Arterial line, CVP and Ultrasound Guidance Line Placement  Intra-op Plan:   Post-operative Plan: Extubation in OR  Informed Consent: I have reviewed the patients History and Physical, chart, labs and discussed the procedure including the risks, benefits and alternatives for the proposed anesthesia with the patient or authorized representative who has indicated his/her understanding and acceptance.       Plan Discussed with: CRNA and Surgeon  Anesthesia Plan Comments:         Anesthesia Quick Evaluation

## 2018-06-23 NOTE — Anesthesia Procedure Notes (Signed)
Central Venous Catheter Insertion Performed by: Oleta Mouse, MD, anesthesiologist Start/End3/07/2018 7:49 AM, 06/23/2018 7:59 AM Patient location: Pre-op. Preanesthetic checklist: patient identified, IV checked, site marked, risks and benefits discussed, surgical consent, monitors and equipment checked, pre-op evaluation, timeout performed and anesthesia consent Position: Trendelenburg Lidocaine 1% used for infiltration and patient sedated Hand hygiene performed  and maximum sterile barriers used  Catheter size: 8.5 Fr Total catheter length 10. Central line was placed.Sheath introducer Procedure performed using ultrasound guided technique. Ultrasound Notes:anatomy identified, needle tip was noted to be adjacent to the nerve/plexus identified, no ultrasound evidence of intravascular and/or intraneural injection and image(s) printed for medical record Attempts: 1 Following insertion, dressing applied, line sutured and Biopatch. Post procedure assessment: blood return through all ports, free fluid flow and no air  Patient tolerated the procedure well with no immediate complications.

## 2018-06-23 NOTE — Anesthesia Procedure Notes (Signed)
Arterial Line Insertion Start/End3/07/2018 8:10 AM, 06/23/2018 8:20 AM Performed by: Josephine Igo, CRNA, CRNA  Preanesthetic checklist: patient identified, IV checked, site marked, risks and benefits discussed, surgical consent, monitors and equipment checked, pre-op evaluation, timeout performed and anesthesia consent Lidocaine 1% used for infiltration Left, radial was placed Catheter size: 20 G Maximum sterile barriers used   Attempts: 2 Procedure performed without using ultrasound guided technique. Following insertion, dressing applied and Biopatch. Post procedure assessment: normal  Patient tolerated the procedure well with no immediate complications.

## 2018-06-23 NOTE — Anesthesia Postprocedure Evaluation (Signed)
Anesthesia Post Note  Patient: Jeremiah [name]  Procedure(s) Performed: VIDEO BRONCHOSCOPY (N/A ) VIDEO ASSISTED THORACOSCOPY (VATS)/LUNG RESECTION, stapling and dissection of apical bleb, wedge resection of right upper lobe mass, lymph node dissection, intercostal nerve block (Right Chest)     Patient location during evaluation: PACU Anesthesia Type: General Level of consciousness: awake and alert Pain management: pain level controlled Vital Signs Assessment: post-procedure vital signs reviewed and stable Respiratory status: spontaneous breathing, nonlabored ventilation, respiratory function stable and patient connected to nasal cannula oxygen Cardiovascular status: blood pressure returned to baseline and stable Postop Assessment: no apparent nausea or vomiting Anesthetic complications: no    Last Vitals:  Vitals:   06/23/18 1615 06/23/18 1645  BP: (!) 164/134 (!) 147/103  Pulse: 62 (!) 55  Resp: (!) 23 17  Temp:    SpO2: 99% 98%    Last Pain:  Vitals:   06/23/18 1606  TempSrc:   PainSc: 0-No pain                 Adrijana Haros DAVID
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
## 2018-06-23 NOTE — Transfer of Care (Signed)
Immediate Anesthesia Transfer of Care Note  Patient: Armarion Greek  Procedure(s) Performed: VIDEO BRONCHOSCOPY (N/A ) VIDEO ASSISTED THORACOSCOPY (VATS)/LUNG RESECTION, stapling and dissection of apical bleb, wedge resection of right upper lobe mass, lymph node dissection, intercostal nerve block (Right Chest)  Patient Location: PACU  Anesthesia Type:General  Level of Consciousness: awake, alert , oriented and patient cooperative  Airway & Oxygen Therapy: Patient Spontanous Breathing and Patient connected to nasal cannula oxygen  Post-op Assessment: Report given to RN and Post -op Vital signs reviewed and stable  Post vital signs: Reviewed and stable  Last Vitals:  Vitals Value Taken Time  BP 125/99 06/23/2018 11:54 AM  Temp    Pulse 67 06/23/2018 11:58 AM  Resp 13 06/23/2018 11:58 AM  SpO2 100 % 06/23/2018 11:58 AM  Vitals shown include unvalidated device data.  Last Pain:  Vitals:   06/23/18 0704  TempSrc: Oral  PainSc:          Complications: No apparent anesthesia complications

## 2018-06-23 NOTE — Progress Notes (Signed)
      [address]       [place] [address]             [phone]      S/p RUL wedge, bleb resection  BP (!) 147/103   Pulse (!) 55   Temp 97.6 F (36.4 C)   Resp 17   Ht 5\' 11"  (1.803 m)   Wt 70.2 kg   SpO2 98%   BMI 21.59 kg/m   Intake/Output Summary (Last 24 hours) at 06/23/2018 1750 Last data filed at 06/23/2018 1428 Gross per 24 hour  Intake 2000 ml  Output 1055 ml  Net 945 ml   Doing well early postop  Remo Lipps C. Roxan Hockey, MD [facility] [phone]

## 2018-06-24 ENCOUNTER — Encounter (HOSPITAL_COMMUNITY): Payer: Self-pay | Admitting: Cardiothoracic Surgery

## 2018-06-24 ENCOUNTER — Inpatient Hospital Stay (HOSPITAL_COMMUNITY): Payer: Medicaid Other

## 2018-06-24 LAB — POCT I-STAT 7, (LYTES, BLD GAS, ICA,H+H)
Acid-base deficit: 4 mmol/L — ABNORMAL HIGH (ref 0.0–2.0)
Bicarbonate: 21.9 mmol/L (ref 20.0–28.0)
Calcium, Ion: 1.22 mmol/L (ref 1.15–1.40)
HCT: 35 % — ABNORMAL LOW (ref 39.0–52.0)
Hemoglobin: 11.9 g/dL — ABNORMAL LOW (ref 13.0–17.0)
O2 Saturation: 93 %
Patient temperature: 97.9
Potassium: 3.9 mmol/L (ref 3.5–5.1)
Sodium: 137 mmol/L (ref 135–145)
TCO2: 23 mmol/L (ref 22–32)
pCO2 arterial: 40 mmHg (ref 32.0–48.0)
pH, Arterial: 7.343 — ABNORMAL LOW (ref 7.350–7.450)
pO2, Arterial: 69 mmHg — ABNORMAL LOW (ref 83.0–108.0)

## 2018-06-24 LAB — ACID FAST SMEAR (AFB, MYCOBACTERIA): Acid Fast Smear: NEGATIVE

## 2018-06-24 LAB — BASIC METABOLIC PANEL
Anion gap: 7 (ref 5–15)
BUN: 9 mg/dL (ref 6–20)
CHLORIDE: 106 mmol/L (ref 98–111)
CO2: 24 mmol/L (ref 22–32)
Calcium: 8.1 mg/dL — ABNORMAL LOW (ref 8.9–10.3)
Creatinine, Ser: 1.11 mg/dL (ref 0.61–1.24)
GFR calc Af Amer: >60 mL/min (ref >60)
GFR calc non Af Amer: >60 mL/min (ref >60)
Glucose, Bld: 168 mg/dL — ABNORMAL HIGH (ref 70–99)
POTASSIUM: 3.9 mmol/L (ref 3.5–5.1)
Sodium: 137 mmol/L (ref 135–145)

## 2018-06-24 LAB — CBC
HEMATOCRIT: 35.9 % — AB (ref 39.0–52.0)
Hemoglobin: 12.3 g/dL — ABNORMAL LOW (ref 13.0–17.0)
MCH: 36.5 pg — ABNORMAL HIGH (ref 26.0–34.0)
MCHC: 34.3 g/dL (ref 30.0–36.0)
MCV: 106.5 fL — ABNORMAL HIGH (ref 80.0–100.0)
Platelets: 123 10*3/uL — ABNORMAL LOW (ref 150–400)
RBC: 3.37 MIL/uL — AB (ref 4.22–5.81)
RDW: 11.9 % (ref 11.5–15.5)
WBC: 10.4 10*3/uL (ref 4.0–10.5)
nRBC: 0 % (ref 0.0–0.2)

## 2018-06-24 LAB — GLUCOSE, CAPILLARY: Glucose-Capillary: 115 mg/dL — ABNORMAL HIGH (ref 70–99)

## 2018-06-24 MED ORDER — POTASSIUM CHLORIDE CRYS ER 20 MEQ PO TBCR
20.0000 meq | EXTENDED_RELEASE_TABLET | Freq: Once | ORAL | Status: AC
  Administered 2018-06-24: 20 meq via ORAL
  Filled 2018-06-24: qty 1

## 2018-06-24 NOTE — Progress Notes (Addendum)
T[facility] DAILY ICU PROGRESS NOTE                   [address]            [place] [address]          [phone]   1 Day Post-Op Procedure(s) (LRB): VIDEO BRONCHOSCOPY (N/A) VIDEO ASSISTED THORACOSCOPY (VATS)/LUNG RESECTION, stapling and dissection of apical bleb, wedge resection of right upper lobe mass, lymph node dissection, intercostal nerve block (Right)  Total Length of Stay:  LOS: 1 day   Subjective: Patient with pain at chest tubes.  Objective: Vital signs in last 24 hours: Temp:  [97.6 F (36.4 C)-98.7 F (37.1 C)] 97.9 F (36.6 C) (03/05 0328) Pulse Rate:  [47-100] 47 (03/05 0700) Cardiac Rhythm: Normal sinus rhythm (03/05 0730) Resp:  [10-36] 14 (03/05 0755) BP: (93-168)/(67-134) 93/70 (03/05 0700) SpO2:  [93 %-100 %] 100 % (03/05 0755) Arterial Line BP: (134-176)/(84-95) 172/91 (03/04 1500) Weight:  [72.9 kg] 72.9 kg (03/05 0600)  Filed Weights   06/23/18 0653 06/24/18 0600  Weight: 70.2 kg 72.9 kg    Weight change: 2.738 kg      Intake/Output from previous day: 03/04 0701 - 03/05 0700 In: 4455.5 [P.O.:720; I.V.:3368.3; IV Piggyback:367.2] Out: 2910 [Urine:2410; Blood:100; Chest Tube:400]  Intake/Output this shift: Total I/O In: 240 [P.O.:240] Out: -   Current Meds: Scheduled Meds: . acetaminophen  1,000 mg Oral Q6H   Or  . acetaminophen (TYLENOL) oral liquid 160 mg/5 mL  1,000 mg Oral Q6H  . bictegravir-emtricitabine-tenofovir AF  1 tablet Oral Daily  . bisacodyl  10 mg Oral Daily  . cloNIDine  0.2 mg Oral BID  . enoxaparin (LOVENOX) injection  40 mg Subcutaneous Q24H  . fentaNYL   Intravenous Q4H  . insulin aspart  0-24 Units Subcutaneous Q6H  . metoprolol tartrate  25 mg Oral BID  . senna-docusate  1 tablet Oral QHS   Continuous Infusions: . 0.9 % NaCl with KCl 20 mEq / L 100 mL/hr at 06/24/18 0700  . potassium chloride     PRN Meds:.diphenhydrAMINE **OR** diphenhydrAMINE, levalbuterol, naloxone **AND** sodium chloride  flush, ondansetron (ZOFRAN) IV, oxyCODONE, potassium chloride, traMADol  General appearance: alert, cooperative and no distress Neurologic: intact Heart: RRR Lungs: Clear to auscultation on left and coarse on the right Abdomen: Soft, non tender, bowel sounds present Extremities: No LE edema Wound: Dressing is clean and dry Chest tubes: to suction, no air leak  Lab Results: CBC: Recent Labs    06/21/18 1323 06/24/18 0425 06/24/18 0426  WBC 4.1  --  10.4  HGB 13.6 11.9* 12.3*  HCT 39.4 35.0* 35.9*  PLT 138*  --  123*   BMET:  Recent Labs    06/21/18 1323 06/24/18 0425 06/24/18 0426  NA 137 137 137  K 4.1 3.9 3.9  CL 107  --  106  CO2 21*  --  24  GLUCOSE 105*  --  168*  BUN 14  --  9  CREATININE 1.22  --  1.11  CALCIUM 8.7*  --  8.1*    CMET: Lab Results  Component Value Date   WBC 10.4 06/24/2018   HGB 12.3 (L) 06/24/2018   HCT 35.9 (L) 06/24/2018   PLT 123 (L) 06/24/2018   GLUCOSE 168 (H) 06/24/2018   CHOL 186 05/07/2018   TRIG 234 (H) 05/07/2018   HDL 63 05/07/2018   LDLCALC 91 05/07/2018   ALT 41 06/21/2018   AST 46 (  H) 06/21/2018   NA 137 06/24/2018   K 3.9 06/24/2018   CL 106 06/24/2018   CREATININE 1.11 06/24/2018   BUN 9 06/24/2018   CO2 24 06/24/2018   INR 1.0 06/21/2018      PT/INR:  Recent Labs    06/21/18 1323  LABPROT 13.3  INR 1.0   Radiology: Dg Chest Port 1 View  Result Date: 06/23/2018 CLINICAL DATA:  Status post VATS EXAM: PORTABLE CHEST 1 VIEW COMPARISON:  06/21/2018 FINDINGS: Cardiac shadow is stable. Right jugular central line and right chest wall port are noted in satisfactory position. Two chest tubes are noted on the right. No evidence of pneumothorax is seen. Changes of recent VATS are noted in the right apex. No other focal abnormality is seen. IMPRESSION: Tubes and lines as described above. No evidence of pneumothorax. Electronically Signed   By: Inez Catalina M.D.   On: 06/23/2018 13:55     Assessment/Plan: S/P  Procedure(s) (LRB): VIDEO BRONCHOSCOPY (N/A) VIDEO ASSISTED THORACOSCOPY (VATS)/LUNG RESECTION, stapling and dissection of apical bleb, wedge resection of right upper lobe mass, lymph node dissection, intercostal nerve block (Right)  1. CV-SB in the 50's 2. Pulmonary-Chest tubes with 400 cc since surgery. Chest tubes are to suction. There is no air leak. On 2 liters of oxygen via [place] (as on PCA). CXR this am appears stable (no pneumothorax). Will place chest tubes to water seal. Encourage incentive spirometer. Await final pathology 3. Expected ABL anemia-H and H 12.3 and 35.9 this am 4. Mild thrombocytopenia-platelets this am 123,000 5. Supplement potassium 6. Decrease IVF 7. CBGs 172/154/115. No history of diabetes. Will stop accu checks and SS PRN 8. Transfer to [place] PA-C 06/24/2018 8:15 AM   Stable post postop Waiting for stepdown bed, original order for bed was 2c  Path pending  I have seen and examined [name] and agree with the above assessment  and plan.  Grace Isaac MD Beeper [phone] Office [phone] 06/24/2018 8:51 AM

## 2018-06-24 NOTE — Progress Notes (Signed)
Patient ID: Jeremiah [name], male   DOB: 1970/08/09, 48 y.o.   MRN: [id number] TCTS Evening Rounds:  Hemodynamically stable   sats 95% on RA  Urine output good. CT output low.  Up in chair. Waiting on stepdown bed.

## 2018-06-24 NOTE — Op Note (Signed)
NAMERAIDYN, WASSINK MEDICAL RECORD [id number] ACCOUNT [id number] DATE OF BIRTH:10-Mar-1971 FACILITY: [facility] LOCATION: [place], [place]  OPERATIVE REPORT  DATE OF PROCEDURE:  06/23/2018  PREOPERATIVE DIAGNOSIS:  Right upper lobe lung mass.  POSTOPERATIVE DIAGNOSIS:  Right upper lobe lung mass, benign by frozen section.  PROCEDURE PERFORMED:  Bronchoscopy, right video-assisted thoracoscopy, resection and stapling of large apical bleb and resection of right upper lobe lung mass with wedge resection and lymph node dissection with intercostal nerve block.  SURGEON:  Lanelle Bal, MD  FIRST ASSISTANT:  Lars Pinks, PA.  BRIEF HISTORY:  The patient is a 48 year old male with previous history of anal cancer treated in [place] who had a known right upper lobe lung nodule in the past who was being followed in [place] after his moving back to the [place] area.   On serial CT scan, he had known large apical bulla in the right lung.  In one the wall of one of the bulla was an area of thickening that had shown to be increased in size to 1.6 cm and was hypermetabolic.  The patient was referred by the pulmonary  service after referral because they were unable to obtain a tissue diagnosis.  The patient was seen and recommended to the patient that we proceed with primary resection of the lesion.  Attempts at biopsy would be difficult, especially with a large  associated bulla.  Risks and options were discussed with the patient.  He was agreeable and signed informed consent.  DESCRIPTION OF PROCEDURE:  The patient underwent general endotracheal anesthesia without incident.  A single lumen endotracheal tube was placed.  Through this endotracheal tube and after appropriate timeout, a fiberoptic bronchoscope was passed to the  subsegmental level in both the right and left tracheobronchial tree without evidence of endobronchial lesions or deformities.  The scope  was removed.  A double lumen endotracheal tube was then placed.  The patient was turned in lateral decubitus position  with the right side up.  The right chest was prepped with Betadine, draped in a sterile manner.  The right lung was collapsed.  A second timeout was performed.  We then proceeded with placement of a 5 mm port site in the 6th intercostal space.  This  with a 30-degree video scope showed adequate collapse of the lung.  The apex of the lung was densely adherent to the chest wall with numerous adhesions to the point of even making it was not obvious there were blebs present.  A working incision was made  slightly more superior and through the incision and the port site and with a small Bovie hook we carefully dissected down the apex of the lung from the chest wall.  As this was freed up, the multiple large bullae were appreciated.  The lower lung was  free of adhesions.  With the apex of the lung then freed with S2 clamp and working through our incision, we were able to manipulate the lung to feel the 1.6 cm mass.  As we examined the lung what appeared to be a mass in the wall of the cyst was actually  in a lung adjacent to the cyst in the lung, but not actually in the cyst itself.  We then proceeded with using a 45 gold load powered stapler through a second port site to resect and staple the multiple large bullous areas of the apex of the lung.  With  this completed, the specimen was removed and sent to  pathology for examination.  We then turned our attention to the firm mass that was palpated in the upper lung and a generous wedge resection of the area was performed and submitted to pathology for  examination.  Frozen section of this area showed lymphocytic infiltrate without definitive carcinoma.  The margins were negative.  While we were waiting for frozen section, we also obtained some lymph node biopsies of mediastinal nodes and placed them in  appropriately labeled specimen comes by  region.  Intercostal nerve block was performed with a spinal needle and a mixture of 50 mL of normal saline, 30 mL of 0.5% ____ and 20 mL of Exparel.  The spinal needle was used to instill approximately 8-10 mL  per intercostal space from just below the port sites to approximately the 4th intercostal space above our working incision.  A 28 Blake drain a 28 standard Argyle tube were placed through the 2 port sites and secured in place.  The Argyle chest tube was  placed anteriorly.  The incision was then closed with interrupted 0 Vicryl in the deep layers, running 2-0 Vicryl subcutaneous tissue and 3-0 subcuticular stitch in skin edges.  Dermabond was applied.  The lung was reinflated.  There was minimal air  leak.  Estimated blood loss was less than 100 mL.  Sponge and needle count was reported as correct at completion of the procedure.  RF scanning was performed and reported clear code.  The patient was extubated in the operating room, transferred to the  recovery room for postoperative observation, having tolerated the procedure without obvious complication.  TN/NUANCE  D:06/24/2018 T:06/24/2018 JOB:[id number]

## 2018-06-24 NOTE — Plan of Care (Addendum)
Pt alert and oriented.  Moves all extremities without difficulty.  Tolerating mobilitity.  On RA satting mid 90s.  NSR on monitor.  BP Soft but pt asymptomatic.  Held all bp meds today.  Tolerating diet.  Passing gas.  Voids in urinal.  Pleural CTs remain without any problems noted.  Pain made tolerable with scheduled Tylenol and PCA.
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
## 2018-06-25 ENCOUNTER — Inpatient Hospital Stay (HOSPITAL_COMMUNITY): Payer: Medicaid Other

## 2018-06-25 ENCOUNTER — Other Ambulatory Visit: Payer: Self-pay

## 2018-06-25 LAB — COMPREHENSIVE METABOLIC PANEL
ALT: 50 U/L — ABNORMAL HIGH (ref 0–44)
AST: 87 U/L — ABNORMAL HIGH (ref 15–41)
Albumin: 3.7 g/dL (ref 3.5–5.0)
Alkaline Phosphatase: 45 U/L (ref 38–126)
Anion gap: 6 (ref 5–15)
BILIRUBIN TOTAL: 1.1 mg/dL (ref 0.3–1.2)
BUN: 9 mg/dL (ref 6–20)
CO2: 25 mmol/L (ref 22–32)
CREATININE: 0.99 mg/dL (ref 0.61–1.24)
Calcium: 8.8 mg/dL — ABNORMAL LOW (ref 8.9–10.3)
Chloride: 107 mmol/L (ref 98–111)
GFR calc Af Amer: >60 mL/min (ref >60)
GFR calc non Af Amer: >60 mL/min (ref >60)
Glucose, Bld: 92 mg/dL (ref 70–99)
Potassium: 4.3 mmol/L (ref 3.5–5.1)
Sodium: 138 mmol/L (ref 135–145)
TOTAL PROTEIN: 5.9 g/dL — AB (ref 6.5–8.1)

## 2018-06-25 LAB — CBC
HEMATOCRIT: 39.5 % (ref 39.0–52.0)
Hemoglobin: 13.2 g/dL (ref 13.0–17.0)
MCH: 35.8 pg — ABNORMAL HIGH (ref 26.0–34.0)
MCHC: 33.4 g/dL (ref 30.0–36.0)
MCV: 107 fL — ABNORMAL HIGH (ref 80.0–100.0)
Platelets: 119 10*3/uL — ABNORMAL LOW (ref 150–400)
RBC: 3.69 MIL/uL — AB (ref 4.22–5.81)
RDW: 11.9 % (ref 11.5–15.5)
WBC: 6.1 10*3/uL (ref 4.0–10.5)
nRBC: 0 % (ref 0.0–0.2)

## 2018-06-25 MED ORDER — METOPROLOL TARTRATE 12.5 MG HALF TABLET
12.5000 mg | ORAL_TABLET | Freq: Two times a day (BID) | ORAL | Status: DC
  Administered 2018-06-27: 12.5 mg via ORAL
  Filled 2018-06-25 (×6): qty 1

## 2018-06-25 NOTE — Progress Notes (Signed)
Ambulated  250 feet along the hallway with front wheel walker  Tolerated well.

## 2018-06-25 NOTE — Progress Notes (Addendum)
[facility] DAILY ICU PROGRESS NOTE                   [address]            [place] [address]          [phone]   2 Days Post-Op Procedure(s) (LRB): VIDEO BRONCHOSCOPY (N/A) VIDEO ASSISTED THORACOSCOPY (VATS)/LUNG RESECTION, stapling and dissection of apical bleb, wedge resection of right upper lobe mass, lymph node dissection, intercostal nerve block (Right)  Total Length of Stay:  LOS: 2 days   Subjective: Patient sitting in chair and has eaten breakfast. He has pain at right chest tube sites.  Objective: Vital signs in last 24 hours: Temp:  [98.1 F (36.7 C)-98.4 F (36.9 C)] 98.1 F (36.7 C) (03/06 0300) Pulse Rate:  [51-58] 51 (03/06 0300) Cardiac Rhythm: Sinus bradycardia (03/06 0000) Resp:  [12-19] 14 (03/06 0510) BP: (94-135)/(62-95) 131/95 (03/06 0300) SpO2:  [95 %-100 %] 95 % (03/06 0510)  Filed Weights   06/23/18 0653 06/24/18 0600  Weight: 70.2 kg 72.9 kg      Intake/Output from previous day: 03/05 0701 - 03/06 0700 In: 1817.1 [P.O.:1440; I.V.:377.1] Out: 3560 [Urine:3200; Chest Tube:360]  Intake/Output this shift: No intake/output data recorded.  Current Meds: Scheduled Meds: . acetaminophen  1,000 mg Oral Q6H   Or  . acetaminophen (TYLENOL) oral liquid 160 mg/5 mL  1,000 mg Oral Q6H  . bictegravir-emtricitabine-tenofovir AF  1 tablet Oral Daily  . bisacodyl  10 mg Oral Daily  . cloNIDine  0.2 mg Oral BID  . enoxaparin (LOVENOX) injection  40 mg Subcutaneous Q24H  . fentaNYL   Intravenous Q4H  . metoprolol tartrate  25 mg Oral BID  . senna-docusate  1 tablet Oral QHS   Continuous Infusions: . 0.9 % NaCl with KCl 20 mEq / L 10 mL/hr at 06/24/18 1900  . potassium chloride     PRN Meds:.diphenhydrAMINE **OR** diphenhydrAMINE, levalbuterol, naloxone **AND** sodium chloride flush, ondansetron (ZOFRAN) IV, oxyCODONE, potassium chloride, traMADol  General appearance: alert, cooperative and no distress Neurologic: intact Heart:  RRR Lungs: Clear to auscultation on left and slightly diminished right apex Abdomen: Soft, non tender, bowel sounds present Extremities: No LE edema Wound: Dressing is clean and dry Chest tubes: to water seal , intermittent + 1 air leak with cough  Lab Results: CBC: Recent Labs    06/24/18 0426 06/25/18 0549  WBC 10.4 6.1  HGB 12.3* 13.2  HCT 35.9* 39.5  PLT 123* 119*   BMET:  Recent Labs    06/24/18 0426 06/25/18 0549  NA 137 138  K 3.9 4.3  CL 106 107  CO2 24 25  GLUCOSE 168* 92  BUN 9 9  CREATININE 1.11 0.99  CALCIUM 8.1* 8.8*    CMET: Lab Results  Component Value Date   WBC 6.1 06/25/2018   HGB 13.2 06/25/2018   HCT 39.5 06/25/2018   PLT 119 (L) 06/25/2018   GLUCOSE 92 06/25/2018   CHOL 186 05/07/2018   TRIG 234 (H) 05/07/2018   HDL 63 05/07/2018   LDLCALC 91 05/07/2018   ALT 50 (H) 06/25/2018   AST 87 (H) 06/25/2018   NA 138 06/25/2018   K 4.3 06/25/2018   CL 107 06/25/2018   CREATININE 0.99 06/25/2018   BUN 9 06/25/2018   CO2 25 06/25/2018   INR 1.0 06/21/2018      PT/INR:  No results for input(s): LABPROT, INR in the last 72 hours. Radiology: No  results found.   Assessment/Plan: S/P Procedure(s) (LRB): VIDEO BRONCHOSCOPY (N/A) VIDEO ASSISTED THORACOSCOPY (VATS)/LUNG RESECTION, stapling and dissection of apical bleb, wedge resection of right upper lobe mass, lymph node dissection, intercostal nerve block (Right)  1. CV-SB in the 50-60's. On Lopressor 25 mg bid and Clonidine 0.2 mg bid. Will decrease Lopressor and continue parameters;may need to stop. 2. Pulmonary-Chest tubes with 360 cc since surgery. Chest tubes are to water seal. There is a +1 intermittent air leak with cough. On 2 liters of oxygen via [place] (as on PCA). CXR this am appears stable (trace right apical pneumothorax).As discussed with [name], air leak appears to be coming from chest tube so will remove Blake drain only today. Encourage incentive spirometer. Await final  pathology 3. Expected ABL anemia-H and H stable at 13.2 and 39.5 this am 4. Mild thrombocytopenia-platelets this am 119,000 5. Remove central line 6. Transfer to [place] PA-C 06/25/2018 7:10 AM   One chest tube removed, no air leak now Follow up chest xray in am Path discussed with the patient  Diagnosis 1. Lung, wedge biopsy/resection, Right upper lobe - NECROTIZING GRANULOMATOUS INFLAMMATION WITH ASSOCIATED FIBROSIS. SEE NOTE. - NO EVIDENCE OF MALIGNANCY. 2. Lung, bleb(s) / bullae, Right apical - SUBPLEURAL BULLA. 3. Lung, bleb(s) / bullae, Right apical #2 - SUBPLEURAL BULLA. 4. Lymph node, biopsy, 2R - BENIGN LYMPH NODE. 5. Lymph node, biopsy, 4R - BENIGN LYMPH NODE. 6. Lymph node, biopsy, 10R - BENIGN LYMPH NODE. 7. Lung, bleb(s) / bullae, Right apical #3 - SUBPLEURAL BULLA. Diagnosis Note 1. GMS stain for fungal organisms is negative. AFB stain for acid fast bacilli is negative but does not rule out possibility of infection. There is no evidence of vasculitis. Foreign material is not identified. (NDK:gt, 06/25/18)  I have seen and examined Jeremiah [name] and agree with the above assessment  and plan.  Grace Isaac MD Beeper [phone] Office [phone] 06/25/2018 5:12 PM

## 2018-06-25 NOTE — Progress Notes (Signed)
Transferred-in from [facility] ambulatory.

## 2018-06-25 NOTE — Plan of Care (Signed)
  Problem: Clinical Measurements: Goal: Ability to maintain clinical measurements within normal limits will improve Outcome: Progressing Goal: Will remain free from infection Outcome: Progressing Goal: Respiratory complications will improve Outcome: Progressing   Problem: Activity: Goal: Risk for activity intolerance will decrease Outcome: Progressing   Problem: Nutrition: Goal: Adequate nutrition will be maintained Outcome: Progressing   Problem: Pain Managment: Goal: General experience of comfort will improve Outcome: Progressing   Problem: Safety: Goal: Ability to remain free from injury will improve Outcome: Progressing   Problem: Skin Integrity: Goal: Risk for impaired skin integrity will decrease Outcome: Progressing

## 2018-06-25 NOTE — Care Management Note (Signed)
Case Management Note  Patient Details  Name: Jeremiah [name] MRN: [id number] Date of Birth: [birth year]  Subjective/Objective:   Pt is s/p VATS - lung CA                 Action/Plan:   PTA independent from home - pt recently moved from [facility].  Pt is 042 positive and informed CM that he utilizes his ID doctor as his primary, pt states he gets his PTA medications (042 related) free of charge.  Pt may need MATCH at discharge - CM will continue to follow    Expected Discharge Date:                  Expected Discharge Plan:  Home/Self Care  In-House Referral:  Clinical Social Work  Discharge planning Services  CM Consult  Post Acute Care Choice:    Choice offered to:     DME Arranged:    DME Agency:     HH Arranged:    [place] Agency:     Status of Service:  In process, will continue to follow  If discussed at Long Length of Stay Meetings, dates discussed:    Additional Comments:  Maryclare Labrador, RN 06/25/2018, 4:14 PM

## 2018-06-26 ENCOUNTER — Inpatient Hospital Stay (HOSPITAL_COMMUNITY): Payer: Medicaid Other

## 2018-06-26 NOTE — Progress Notes (Signed)
Nursing note: D/C'd PCA per MD order.  Syringe empty.  No medication to waste.

## 2018-06-26 NOTE — Progress Notes (Addendum)
3 Days Post-Op Procedure(s) (LRB): VIDEO BRONCHOSCOPY (N/A) VIDEO ASSISTED THORACOSCOPY (VATS)/LUNG RESECTION, stapling and dissection of apical bleb, wedge resection of right upper lobe mass, lymph node dissection, intercostal nerve block (Right) Subjective: Sitting up on the side of the bed.  Says he is comfortable right now.  Denies pain but is having some nausea.. I suggested this may be related to ongoing use of the PCA and he agreed to discontinue it today.  Objective: Vital signs in last 24 hours: Temp:  [98.1 F (36.7 C)-98.4 F (36.9 C)] 98.4 F (36.9 C) (03/07 0321) Pulse Rate:  [55-65] 65 (03/07 0321) Cardiac Rhythm: Sinus bradycardia (03/07 0700) Resp:  [12-21] 18 (03/07 0400) BP: (107-146)/(74-110) 141/91 (03/07 0321) SpO2:  [0 %-100 %] 98 % (03/07 0400) FiO2 (%):  [0 %] 0 % (03/06 1506)     Intake/Output from previous day: 03/06 0701 - 03/07 0700 In: 780 [P.O.:780] Out: 3795 [Urine:3625; Chest Tube:170] Intake/Output this shift: No intake/output data recorded.  General appearance: alert, cooperative and no distress Neurologic: intact Heart: regular rate and rhythm Lungs: clear to auscultation bilaterally Abdomen: Soft and nontender Wound: The remaining chest tube is well secured.  There is a small air leak with a cough today.  Minimal drainage.  The thoracotomy incision is well approximated and dry.  It is covered with Dermabond.  Lab Results: Recent Labs    06/24/18 0426 06/25/18 0549  WBC 10.4 6.1  HGB 12.3* 13.2  HCT 35.9* 39.5  PLT 123* 119*   BMET:  Recent Labs    06/24/18 0426 06/25/18 0549  NA 137 138  K 3.9 4.3  CL 106 107  CO2 24 25  GLUCOSE 168* 92  BUN 9 9  CREATININE 1.11 0.99  CALCIUM 8.1* 8.8*    PT/INR: No results for input(s): LABPROT, INR in the last 72 hours. ABG    Component Value Date/Time   PHART 7.343 (L) 06/24/2018 0425   HCO3 21.9 06/24/2018 0425   TCO2 23 06/24/2018 0425   ACIDBASEDEF 4.0 (H) 06/24/2018 0425   O2SAT 93.0 06/24/2018 0425   CBG (last 3)  Recent Labs    06/23/18 1828 06/23/18 2325 06/24/18 0537  GLUCAP 172* 154* 115*    Assessment/Plan: S/P Procedure(s) (LRB): VIDEO BRONCHOSCOPY (N/A) VIDEO ASSISTED THORACOSCOPY (VATS)/LUNG RESECTION, stapling and dissection of apical bleb, wedge resection of right upper lobe mass, lymph node dissection, intercostal nerve block (Right)  -Postop day 3 right video-assisted thoracoscopy with apical bleb resection, wedge resection of right upper lobe mass, and lymph node dissection.  Pathology shows necrotizing granulomatous disease within the resected mass.  Lymph nodes were benign.  A very small air leak persists.  We will leave the chest tube to waterseal again today and reassess tomorrow. -History of hypertension-blood pressure control is reasonable on current doses of metoprolol and clonidine.  No change in management. -History of HIV-multidrug antiviral therapy has been resumed. -DVT prophylaxis-continue enoxaparin and encourage mobility    LOS: 3 days    Antony Odea, PA-C [phone] 06/26/2018  I have seen and examined the patient and agree with the assessment and plan as outlined.  I do not appreciate an air leak on exam at this time.  Possibly remove tube tomorrow and d/c home on Monday  Rexene Alberts, MD 06/26/2018 10:25 AM

## 2018-06-27 ENCOUNTER — Inpatient Hospital Stay (HOSPITAL_COMMUNITY): Payer: Medicaid Other

## 2018-06-27 NOTE — Progress Notes (Addendum)
4 Days Post-Op Procedure(s) (LRB): VIDEO BRONCHOSCOPY (N/A) VIDEO ASSISTED THORACOSCOPY (VATS)/LUNG RESECTION, stapling and dissection of apical bleb, wedge resection of right upper lobe mass, lymph node dissection, intercostal nerve block (Right) Subjective: Awake and alert.  No new problems.  Pain well controlled.  No shortness of breath.  Objective: Vital signs in last 24 hours: Temp:  [98 F (36.7 C)-98.4 F (36.9 C)] 98 F (36.7 C) (03/08 0811) Pulse Rate:  [57-74] 60 (03/08 0811) Cardiac Rhythm: Normal sinus rhythm (03/08 0811) Resp:  [12-19] 14 (03/08 0811) BP: (113-141)/(75-100) 123/75 (03/08 0811) SpO2:  [96 %-100 %] 99 % (03/08 0811)    Intake/Output from previous day: 03/07 0701 - 03/08 0700 In: 1860 [P.O.:1820] Out: 2620 [Urine:2600; Chest Tube:20] Intake/Output this shift: No intake/output data recorded.  General appearance: alert, cooperative and no distress Neurologic: intact Heart: regular rate and rhythm Lungs: clear to auscultation bilaterally Abdomen: Soft and nontender Wound: The remaining chest tube is well secured.  There is no air leak today.  Minimal drainage.  The thoracotomy incision is well approximated and dry.  It is covered with Dermabond.  Lab Results: Recent Labs    06/25/18 0549  WBC 6.1  HGB 13.2  HCT 39.5  PLT 119*   BMET:  Recent Labs    06/25/18 0549  NA 138  K 4.3  CL 107  CO2 25  GLUCOSE 92  BUN 9  CREATININE 0.99  CALCIUM 8.8*    PT/INR: No results for input(s): LABPROT, INR in the last 72 hours. ABG    Component Value Date/Time   PHART 7.343 (L) 06/24/2018 0425   HCO3 21.9 06/24/2018 0425   TCO2 23 06/24/2018 0425   ACIDBASEDEF 4.0 (H) 06/24/2018 0425   O2SAT 93.0 06/24/2018 0425   CBG (last 3)  No results for input(s): GLUCAP in the last 72 hours.  Assessment/Plan: S/P Procedure(s) (LRB): VIDEO BRONCHOSCOPY (N/A) VIDEO ASSISTED THORACOSCOPY (VATS)/LUNG RESECTION, stapling and dissection of apical bleb,  wedge resection of right upper lobe mass, lymph node dissection, intercostal nerve block (Right)  -Postop day 4 right video-assisted thoracoscopy with apical bleb resection, wedge resection of right upper lobe mass, and lymph node dissection.  Pathology shows necrotizing granulomatous disease within the resected mass.  Lymph nodes were benign.  No air leak today.  Will remove CT and repeat CXR later today.  Possibly discharge later today or in AM. -History of hypertension-blood pressure control is reasonable on current doses of metoprolol and clonidine.  No change in management. -History of HIV-multidrug antiviral therapy has been resumed. -DVT prophylaxis-continue enoxaparin and encourage mobility    LOS: 4 days    Antony Odea, PA-C [phone] 06/27/2018  I have seen and examined the patient and agree with the assessment and plan as outlined.  Tentatively plan d/c home tomorrow if CXR stable.  Rexene Alberts, MD 06/27/2018 11:26 AM

## 2018-06-27 NOTE — Plan of Care (Signed)

## 2018-06-28 ENCOUNTER — Inpatient Hospital Stay (HOSPITAL_COMMUNITY): Payer: Medicaid Other

## 2018-06-28 ENCOUNTER — Encounter: Payer: Self-pay | Admitting: Internal Medicine

## 2018-06-28 LAB — AEROBIC/ANAEROBIC CULTURE W GRAM STAIN (SURGICAL/DEEP WOUND): Culture: NO GROWTH

## 2018-06-28 MED ORDER — OXYCODONE HCL 5 MG PO TABS: 5.0000 mg | ORAL_TABLET | ORAL | 0 refills | Status: DC | PRN

## 2018-06-28 MED ORDER — CLONIDINE HCL 0.2 MG PO TABS: 0.2000 mg | ORAL_TABLET | Freq: Two times a day (BID) | ORAL | Status: DC

## 2018-06-28 MED ORDER — CLONIDINE HCL 0.2 MG PO TABS: 0.2000 mg | ORAL_TABLET | Freq: Two times a day (BID) | ORAL | 1 refills | Status: DC

## 2018-06-28 NOTE — Progress Notes (Signed)
Pt continues to have serosanguinous drainage from posterior chest tube site. Dressing has been changed twice since 2300. Posterior chest tube site continues to be open and non-sutured. Vaseline gauze applied. Subq air assessed around sight. Will continue to monitor.  Tressie Ellis, RN

## 2018-06-28 NOTE — Progress Notes (Addendum)
      [address]       [place] [address]             [phone]       5 Days Post-Op Procedure(s) (LRB): VIDEO BRONCHOSCOPY (N/A) VIDEO ASSISTED THORACOSCOPY (VATS)/LUNG RESECTION, stapling and dissection of apical bleb, wedge resection of right upper lobe mass, lymph node dissection, intercostal nerve block (Right)  Subjective: Patient without complaints. He wants to go home.  Objective: Vital signs in last 24 hours: Temp:  [98 F (36.7 C)-98.3 F (36.8 C)] 98.1 F (36.7 C) (03/09 0352) Pulse Rate:  [56-70] 60 (03/08 1900) Cardiac Rhythm: Sinus bradycardia (03/09 0700) Resp:  [13-25] 13 (03/09 0352) BP: (91-128)/(55-85) 109/68 (03/09 0352) SpO2:  [98 %-99 %] 98 % (03/08 1900)    Intake/Output from previous day: 03/08 0701 - 03/09 0700 In: 760 [P.O.:760] Out: 250 [Urine:250]   Physical Exam:  Cardiovascular: Slightly bradycardic Pulmonary: Slightly decreased at right apex and clear on the left Abdomen: Soft, non tender, bowel sounds present. Extremities: No lower extremity edema. Wounds: Clean and dry.  No erythema or signs of infection. Posterior chest tube wound with stitch (cut but not removed) so has sero sanguinous drainage  Lab Results: CBC:No results for input(s): WBC, HGB, HCT, PLT in the last 72 hours. BMET: No results for input(s): NA, K, CL, CO2, GLUCOSE, BUN, CREATININE, CALCIUM in the last 72 hours.  PT/INR: No results for input(s): LABPROT, INR in the last 72 hours. ABG:  INR: Will add last result for INR, ABG once components are confirmed Will add last 4 CBG results once components are confirmed  Assessment/Plan: 1. CV-SB in the 50-60's. On Lopressor 12.5 mg bid and Clonidine 0.2 mg bid. Lopressor was decreased from 25 mg bid but still not receiving as HR 60. Will stop and have him follow up with his medical doctor after discharge. 2. Pulmonary-On room air. CXR this am appears stable (decreased right apical pneumothorax, minor  subcutaneous emphysema right lateral chest wall). Encourage incentive spirometer.  3. Mild thrombocytopenia-last platelets 119,000 4. Discharge  Kaven Cumbie M ZimmermanPA-C 06/28/2018,7:22 AM [phone]

## 2018-06-28 NOTE — Progress Notes (Signed)
Discharged: Patient left unit ambulating with all belongings. Discharge instructions regarding follow up appointments, medications, and surgical incision care discussed. Patient verbalizes an understanding of discharge instructions.

## 2018-06-28 NOTE — Care Management Note (Signed)
Case Management Note  Patient Details  Name: Jeremiah [name] MRN: [id number] Date of Birth: June 08, 1970  Subjective/Objective:   Pt is s/p VATS - lung CA                 Action/Plan:   PTA independent from home - pt recently moved from [facility].  Pt is 042 positive and informed CM that he utilizes his ID doctor as his primary, pt states he gets his PTA medications (042 related) free of charge.  Pt may need MATCH at discharge - CM will continue to follow    Expected Discharge Date:  06/28/18               Expected Discharge Plan:  Home/Self Care  In-House Referral:  Clinical Social Work  Discharge planning Services  CM Consult  Post Acute Care Choice:    Choice offered to:     DME Arranged:    DME Agency:     HH Arranged:    HH Agency:     Status of Service:  In process, will continue to follow  If discussed at Long Length of Stay Meetings, dates discussed:    Additional Comments: 06/28/2018 Pt will discharge home today.  CM matched pts discharge meds; Clonidine.  Pt will continue to receive his HIV meds from the [facility].  NO other CM needs - CM signing off Maryclare Labrador, RN 06/28/2018, 9:30 AM

## 2018-06-28 NOTE — Discharge Instructions (Signed)
Please apply dry 4x4s with tape to posterior chest tube wound (one that is open). Change dressing daily and PRN.   Thoracoscopy, Care After This sheet gives you information about how to care for yourself after your procedure. Your health care provider may also give you more specific instructions. If you have problems or questions, contact your health care provider. What can I expect after the procedure? After the procedure, it is common to have pain and soreness in the surgical area. Follow these instructions at home: Incision care   Follow instructions from your health care provider about how to take care of your incision. Make sure you: ? Wash your hands with soap and water before you change your bandage (dressing). If soap and water are not available, use hand sanitizer. ? Change your dressing as told by your health care provider. ? Leave stitches (sutures), skin glue, or adhesive strips in place. These skin closures may need to stay in place for 2 weeks or longer. If adhesive strip edges start to loosen and curl up, you may trim the loose edges. Do not remove adhesive strips completely unless your health care provider tells you to do that.  Check your incision areas every day for signs of infection. Check for: ? Redness, swelling, or pain. ? Fluid or blood. ? Warmth. ? Pus or a bad smell.  Do not take baths, swim, or use a hot tub until your health care provider approves. You may take showers. Medicines  Take over-the-counter and prescription medicines only as told by your health care provider.  If you were prescribed an antibiotic medicine, take it as told by your health care provider. Do not stop taking the antibiotic even if you start to feel better.  Do not drive or use heavy machinery while taking prescription pain medicine.  If you are taking prescription pain medicine, take actions to prevent or treat constipation. Your health care provider may recommend that you: ? Drink  enough fluid to keep your urine pale yellow. ? Eat foods that are high in fiber, such as fresh fruits and vegetables, whole grains, and beans. ? Limit foods that are high in fat and processed sugars, such as fried and sweet foods. ? Take an over-the-counter or prescription medicine for constipation. Managing pain, stiffness, and swelling   If directed, put ice on the affected area: ? Put ice in a plastic bag. ? Place a towel between your skin and the bag. ? Leave the ice on for 20 minutes, 2-3 times a day. Preventing lung infection  To prevent pneumonia and to keep your lungs healthy: ? Try to cough often. If it hurts to cough, hold a pillow against your chest as you cough. ? Take deep breaths or do breathing exercises as instructed by your health care provider. ? If you were given an incentive spirometer, use it as directed by your health care provider. General instructions  Do not lift anything that is heavier than 10 lb (4.5 kg), or the limit that you are told, until your health care provider says that it is safe.  Do not use any products that contain nicotine or tobacco, such as cigarettes and e-cigarettes. These can delay healing after surgery. If you need help quitting, ask your health care provider.  Avoid driving until your health care provider approves.  If you have a chest drainage tube, care for it as instructed by your health care provider. Do not travel by airplane after the chest drainage tube is removed  until your health care provider approves.  Keep all follow-up visits as told by your health care provider. This is important. Contact a health care provider if:  You have a fever.  Pain medicines do not ease your pain.  You have redness, swelling, or increasing pain in your incision area.  You develop a cough that does not go away, or you are coughing up mucus that is yellow or green. Get help right away if:  You have fluid, blood, or pus coming from your  incision.  There is a bad smell coming from your incision or dressing.  You develop a rash.  You cough up blood.  You develop light-headedness, or you feel faint.  You have difficulty breathing.  You develop chest pain.  Your heartbeat feels irregular or very fast. These symptoms may represent a serious problem that is an emergency. Do not wait to see if the symptoms will go away. Get medical help right away. Call your local emergency services (911 in the [place] Do not drive yourself to the hospital. Summary  Follow instructions from your health care provider about how to take care of your incision.  Do not drive or use heavy machinery while taking prescription pain medicine.  Leave stitches (sutures), skin glue, or adhesive strips in place.  Check your incision areas every day for signs of infection. This information is not intended to replace advice given to you by your health care provider. Make sure you discuss any questions you have with your health care provider. Document Released: 10/25/2004 Document Revised: 03/17/2017 Document Reviewed: 03/17/2017 Elsevier Interactive Patient Education  2019 [employer].

## 2018-06-29 ENCOUNTER — Encounter (HOSPITAL_COMMUNITY): Payer: Self-pay | Admitting: Emergency Medicine

## 2018-06-29 ENCOUNTER — Other Ambulatory Visit: Payer: Self-pay

## 2018-06-29 ENCOUNTER — Emergency Department (HOSPITAL_COMMUNITY)
Admission: EM | Admit: 2018-06-29 | Discharge: 2018-06-29 | Disposition: A | Discharge status: Home / Self Care | Payer: Medicaid Other | Source: Home / Self Care | Attending: Emergency Medicine | Admitting: Emergency Medicine

## 2018-06-29 ENCOUNTER — Emergency Department (HOSPITAL_COMMUNITY): Payer: Medicaid Other

## 2018-06-29 ENCOUNTER — Telehealth: Payer: Self-pay | Admitting: Behavioral Health

## 2018-06-29 DIAGNOSIS — F17210 Nicotine dependence, cigarettes, uncomplicated: Secondary | ICD-10-CM

## 2018-06-29 DIAGNOSIS — Z79899 Other long term (current) drug therapy: Secondary | ICD-10-CM

## 2018-06-29 DIAGNOSIS — J449 Chronic obstructive pulmonary disease, unspecified: Secondary | ICD-10-CM

## 2018-06-29 DIAGNOSIS — J95811 Postprocedural pneumothorax: Secondary | ICD-10-CM | POA: Insufficient documentation

## 2018-06-29 DIAGNOSIS — Z21 Asymptomatic human immunodeficiency virus [HIV] infection status: Secondary | ICD-10-CM

## 2018-06-29 DIAGNOSIS — Z8709 Personal history of other diseases of the respiratory system: Secondary | ICD-10-CM

## 2018-06-29 HISTORY — DX: Personal history of other diseases of the respiratory system: Z87.09

## 2018-06-29 LAB — HEPATIC FUNCTION PANEL
ALT: 288 U/L — AB (ref 0–44)
AST: 263 U/L — ABNORMAL HIGH (ref 15–41)
Albumin: 3.7 g/dL (ref 3.5–5.0)
Alkaline Phosphatase: 68 U/L (ref 38–126)
BILIRUBIN DIRECT: 0.4 mg/dL — AB (ref 0.0–0.2)
BILIRUBIN INDIRECT: 1.4 mg/dL — AB (ref 0.3–0.9)
Total Bilirubin: 1.8 mg/dL — ABNORMAL HIGH (ref 0.3–1.2)
Total Protein: 6.2 g/dL — ABNORMAL LOW (ref 6.5–8.1)

## 2018-06-29 LAB — CBC WITH DIFFERENTIAL/PLATELET
Abs Immature Granulocytes: 0.13 10*3/uL — ABNORMAL HIGH (ref 0.00–0.07)
Basophils Absolute: 0 10*3/uL (ref 0.0–0.1)
Basophils Relative: 0 %
Eosinophils Absolute: 0 10*3/uL (ref 0.0–0.5)
Eosinophils Relative: 1 %
HCT: 38.2 % — ABNORMAL LOW (ref 39.0–52.0)
Hemoglobin: 13.4 g/dL (ref 13.0–17.0)
Immature Granulocytes: 3 %
Lymphocytes Relative: 17 %
Lymphs Abs: 0.9 10*3/uL (ref 0.7–4.0)
MCH: 36.7 pg — ABNORMAL HIGH (ref 26.0–34.0)
MCHC: 35.1 g/dL (ref 30.0–36.0)
MCV: 104.7 fL — ABNORMAL HIGH (ref 80.0–100.0)
Monocytes Absolute: 0.5 10*3/uL (ref 0.1–1.0)
Monocytes Relative: 10 %
Neutro Abs: 3.5 10*3/uL (ref 1.7–7.7)
Neutrophils Relative %: 69 %
Platelets: 115 10*3/uL — ABNORMAL LOW (ref 150–400)
RBC: 3.65 MIL/uL — ABNORMAL LOW (ref 4.22–5.81)
RDW: 11.9 % (ref 11.5–15.5)
WBC: 5 10*3/uL (ref 4.0–10.5)
nRBC: 0 % (ref 0.0–0.2)

## 2018-06-29 LAB — BASIC METABOLIC PANEL
Anion gap: 9 (ref 5–15)
BUN: 11 mg/dL (ref 6–20)
CO2: 23 mmol/L (ref 22–32)
Calcium: 8.8 mg/dL — ABNORMAL LOW (ref 8.9–10.3)
Chloride: 102 mmol/L (ref 98–111)
Creatinine, Ser: 0.93 mg/dL (ref 0.61–1.24)
GFR calc Af Amer: >60 mL/min (ref >60)
GFR calc non Af Amer: >60 mL/min (ref >60)
Glucose, Bld: 82 mg/dL (ref 70–99)
POTASSIUM: 4.4 mmol/L (ref 3.5–5.1)
Sodium: 134 mmol/L — ABNORMAL LOW (ref 135–145)

## 2018-06-29 LAB — LIPASE, BLOOD: Lipase: 29 U/L (ref 11–51)

## 2018-06-29 LAB — I-STAT TROPONIN, ED: Troponin i, poc: 0 ng/mL (ref 0.00–0.08)

## 2018-06-29 MED ORDER — OXYCODONE HCL 5 MG PO TABS
5.0000 mg | ORAL_TABLET | Freq: Once | ORAL | Status: AC
  Administered 2018-06-29: 5 mg via ORAL
  Filled 2018-06-29: qty 1

## 2018-06-29 NOTE — Telephone Encounter (Signed)
Called patient left message for him to call the office back and left the call back number.   Dr. Megan Salon would like to reschedule patient's lab appointment and office visit to April.

## 2018-06-29 NOTE — ED Triage Notes (Signed)
BIB [facility] from home with c/o of chest pain and shortness of breath. Pt had recent lobectomy. Afebrile on arrival. VSS.

## 2018-06-29 NOTE — ED Provider Notes (Signed)
[name] Provider Note   CSN: [id number] Arrival date & time: 06/29/18  0549    History   Chief Complaint Chief Complaint  Patient presents with  . Chest Pain  . Shortness of Breath    HPI [name] is a 48 y.o. male.     HPI   Jeremiah [name] is a 48 y.o. male, with a history of COPD, HIV, and right upper lobectomy, presenting to the ED with shortness of breath beginning last night. Retrosternal, pressure, radiating throughout the chest, worse with deep inspiration, pain is mild at rest, 10/10 with inspiration.  He has had a cough productive of clear sputum.  He was discharged from the hospital yesterday and did not have the symptoms present at that time, however, states he lives at home alone and has had to do all his ADLs himself.  Patient underwent VATS lobectomy due to suspicious lung nodule performed March 4 by [name].  Last CD4 count and HIV quant were performed January 17 and were noted to be 200 and undetectable, respectively.  However, ID notes indicate these values are stable and acceptable.  Denies fever/chills, N/V/D, abdominal pain, syncope, hemoptysis, hematochezia/melena, urinary symptoms, or any other complaints.   Past Medical History:  Diagnosis Date  . Anal warts   . COPD (chronic obstructive pulmonary disease) ([place])   . Hemorrhoids 02/25/2015  . History of tuberculin skin testing within last year 2014  . HIV (human immunodeficiency virus infection) ([place])   . Pneumonia 2007    Patient Active Problem List   Diagnosis Date Noted  . Mass of upper lobe of right lung 06/23/2018  . 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[place]) 01/06/2018  . Chronic pain 03/31/2016  . Anal cancer ([place]) 11/07/2015  . Hyperglycemia 11/07/2015  . Gonorrhea 10/30/2015  . CAP (community acquired pneumonia) 02/25/2015  . Hemorrhoids 02/25/2015  . Human immunodeficiency virus (HIV) disease ([place]) 05/19/2013  . Dyslipidemia 05/19/2013  .  Cigarette smoker 05/19/2013  . Depression 05/19/2013  . Dental caries 05/19/2013  . Hx of unilateral orchiectomy 05/19/2013  . Latent syphilis 05/06/2013  . Anal warts 05/06/2013  . History of chlamydia 05/06/2013    Past Surgical History:  Procedure Laterality Date  . HERNIA REPAIR  1991   Abdominal   . unilateral orchiectomy    . VIDEO ASSISTED THORACOSCOPY (VATS)/WEDGE RESECTION Right 06/23/2018   Procedure: VIDEO ASSISTED THORACOSCOPY (VATS)/LUNG RESECTION, stapling and dissection of apical bleb, wedge resection of right upper lobe mass, lymph node dissection, intercostal nerve block;  Surgeon: Grace Isaac, MD;  Location: [facility];  Service: Thoracic;  Laterality: Right;  Marland Kitchen VIDEO BRONCHOSCOPY N/A 06/23/2018   Procedure: VIDEO BRONCHOSCOPY;  Surgeon: Grace Isaac, MD;  Location: [facility] OR;  Service: Thoracic;  Laterality: N/A;        Home Medications    Prior to Admission medications   Medication Sig Start Date End Date Taking? Authorizing Provider  acetaminophen (TYLENOL 8 HOUR) 650 MG CR tablet Take 1 tablet (650 mg total) by mouth every 8 (eight) hours as needed. 06/02/18   Varney Biles, MD  albuterol (PROVENTIL HFA;VENTOLIN HFA) 108 (90 Base) MCG/ACT inhaler Inhale 1-2 puffs into the lungs every 6 (six) hours as needed for wheezing or shortness of breath.    [provider]  Aspirin-Acetaminophen (GOODYS BODY PAIN PO) Take 1 packet by mouth daily as needed (pain).    [provider]  BIKTARVY 50-200-25 MG TABS tablet TAKE 1 TABLET BY MOUTH DAILY Patient  taking differently: Take 1 tablet by mouth daily.  06/11/18   Michel Bickers, MD  cloNIDine (CATAPRES) 0.2 MG tablet Take 1 tablet (0.2 mg total) by mouth 2 (two) times daily. 06/28/18   Nani Skillern, PA-C  oxyCODONE (OXY IR/ROXICODONE) 5 MG immediate release tablet Take 1 tablet (5 mg total) by mouth every 4 (four) hours as needed for severe pain. 06/28/18   Nani Skillern, PA-C    Family  History Family History  Problem Relation Age of Onset  . Diabetes Mother   . Hypertension Mother   . Cancer Mother        breast  . Diabetes Father   . Hypertension Father     Social History Social History   Tobacco Use  . Smoking status: Current Every Day Smoker    Packs/day: 1.00    Years: 24.00    Pack years: 24.00    Types: Cigarettes  . Smokeless tobacco: Never Used  Substance Use Topics  . Alcohol use: Yes    Alcohol/week: 0.0 standard drinks    Comment: less than 40 oz a week  . Drug use: No    Comment: Past history of crack cocaine  use      Allergies   Nsaids and Sulfa antibiotics   Review of Systems Review of Systems  Constitutional: Negative for chills, diaphoresis and fever.  Respiratory: Positive for cough and shortness of breath.   Cardiovascular: Positive for chest pain.  Gastrointestinal: Negative for abdominal pain, diarrhea, nausea and vomiting.  Musculoskeletal: Negative for back pain.  Neurological: Negative for syncope.  All other systems reviewed and are negative.    Physical Exam Updated Vital Signs BP (!) 142/90   Pulse 70   Temp 98.4 F (36.9 C)   Resp 18   Ht 5\' 11"  (1.803 m)   Wt 70.8 kg   SpO2 96%   BMI 21.76 kg/m   Physical Exam Vitals signs and nursing note reviewed.  Constitutional:      General: He is not in acute distress.    Appearance: He is well-developed. He is not diaphoretic.  HENT:     Head: Normocephalic and atraumatic.     Mouth/Throat:     Mouth: Mucous membranes are moist.     Pharynx: Oropharynx is clear.  Eyes:     Conjunctiva/sclera: Conjunctivae normal.  Neck:     Musculoskeletal: Neck supple.  Cardiovascular:     Rate and Rhythm: Normal rate and regular rhythm.     Pulses: Normal pulses.     Heart sounds: Normal heart sounds.     Comments: Tactile temperature in the extremities appropriate and equal bilaterally. Pulmonary:     Effort: No respiratory distress.     Breath sounds: Examination  of the right-upper field reveals decreased breath sounds. Decreased breath sounds present.     Comments: No distress at rest.  Patient has tachypnea and increased work of breathing with change in position and mild exertion in the bed. Abdominal:     Palpations: Abdomen is soft.     Tenderness: There is no abdominal tenderness. There is no guarding.  Musculoskeletal:     Right lower leg: No edema.     Left lower leg: No edema.  Lymphadenopathy:     Cervical: No cervical adenopathy.  Skin:    General: Skin is warm and dry.  Neurological:     Mental Status: He is alert.  Psychiatric:        Mood and Affect:  Mood and affect normal.        Speech: Speech normal.        Behavior: Behavior normal.      ED Treatments / Results  Labs (all labs ordered are listed, but only abnormal results are displayed) Labs Reviewed  CBC WITH DIFFERENTIAL/PLATELET - Abnormal; Notable for the following components:      Result Value   RBC 3.65 (*)    HCT 38.2 (*)    MCV 104.7 (*)    MCH 36.7 (*)    Platelets 115 (*)    Abs Immature Granulocytes 0.13 (*)    All other components within normal limits  BASIC METABOLIC PANEL - Abnormal; Notable for the following components:   Sodium 134 (*)    Calcium 8.8 (*)    All other components within normal limits  HEPATIC FUNCTION PANEL - Abnormal; Notable for the following components:   Total Protein 6.2 (*)    AST 263 (*)    ALT 288 (*)    Total Bilirubin 1.8 (*)    Bilirubin, Direct 0.4 (*)    Indirect Bilirubin 1.4 (*)    All other components within normal limits  LIPASE, BLOOD  I-STAT TROPONIN, ED    EKG EKG Interpretation  Date/Time:  Tuesday June 29 2018 05:51:45 EDT Ventricular Rate:  75 PR Interval:    QRS Duration: 76 QT Interval:  402 QTC Calculation: 449 R Axis:   67 Text Interpretation:  Sinus rhythm Confirmed by Dory Horn) on 06/29/2018 6:09:49 AM   Radiology Dg Chest 2 View  Result Date: 06/29/2018 CLINICAL DATA:   Chest pain and shortness of breath EXAM: CHEST - 2 VIEW COMPARISON:  06/28/2018 FINDINGS: Postsurgical changes are again noted in the right apex. The previously seen right-sided pneumothorax has increased slightly in the interval from the prior exam. There is now approximately 4 cm excursion from the chest wall superiorly. Bibasilar atelectatic changes are noted increased from the prior exam. The left lung shows no pneumothorax. Right chest wall port is again seen and stable. Cardiac shadow is stable. IMPRESSION: Increasing right-sided pneumothorax. Increased bibasilar atelectasis. Critical Value/emergent results were called by telephone at the time of interpretation on 06/29/2018 at 6:37 am to Dr. Veatrice Kells , who verbally acknowledged these results. Electronically Signed   By: Inez Catalina M.D.   On: 06/29/2018 06:37   Dg Chest 2 View  Result Date: 06/28/2018 CLINICAL DATA:  Partial nephrectomy and pneumothorax EXAM: CHEST - 2 VIEW COMPARISON:  Yesterday FINDINGS: Small right apical pneumothorax, 2 rib interspaces in height-possibly decreased from yesterday (although different positioning). Mild atelectasis at the bases, increased. Chest wall emphysema. Normal heart size. IMPRESSION: 1. Stable to decreased small right apical pneumothorax. 2. Increased atelectasis. Electronically Signed   By: [name] M.D.   On: 06/28/2018 07:33   Dg Chest Port 1 View  Result Date: 06/27/2018 CLINICAL DATA:  Post chest tube removal status post partial lobectomy. EXAM: PORTABLE CHEST 1 VIEW COMPARISON:  06/27/2018 at 0637 hours FINDINGS: Chain sutures project over the right lung apex. Small stable right apical pneumothorax persists measuring up to 3.2 cm, previously 2.9 cm in estimation. Right-sided chest tube has been removed. No midline shift. Heart and mediastinal contours are stable. Port catheter tip terminates in the right atrium. No acute osseous abnormality. Soft tissue emphysema along the right lateral chest  wall likely associated from chest tube insertion. IMPRESSION: Status post right-sided chest tube removal with stable right apical pneumothorax measuring up to  3.2 cm. Electronically Signed   By: Ashley Royalty M.D.   On: 06/27/2018 18:47    Procedures Procedures (including critical care time)  Medications Ordered in ED Medications  oxyCODONE (Oxy IR/ROXICODONE) immediate release tablet 5 mg (5 mg Oral Given 06/29/18 9379)     Initial Impression / Assessment and Plan / ED Course  I have reviewed the triage vital signs and the nursing notes.  Pertinent labs & imaging results that were available during my care of the patient were reviewed by me and considered in my medical decision making (see chart for details).  Clinical Course as of Jun 28 933  Tue Jun 29, 2018  0717 Spoke with [name], [facility] surgeon. States someone from the CT surgery team will come assess the patient, but based on the chest x-ray, it does not appear to be large enough to place a chest tube.   [SJ]  0845 Consistent with previous values.  Platelets(!): 115 [SJ]  0900 Spoke with [name] who personally assessed the patient.  Recommends the patient be discharged.  Prior to discharge give a dose of same medication he was prescribed for postop pain control.  He will follow-up in the office for repeat chest x-ray on March 12.   [SJ]    Clinical Course User Index [SJ] Joy, Shawn C, PA-C       Patient presents with shortness of breath and chest pain following lobectomy on March 4.Patient is nontoxic appearing, afebrile, not tachycardic, not tachypneic, not hypotensive, maintains excellent SPO2 on room air, and is in no apparent distress.  He has continued or somewhat worse pneumothorax on chest x-ray.  He was assessed by CT surgery here in the ED and states findings are not concerning at this time. He will have close office follow-up with repeat chest x-ray. Elevation in patient's AST, ALT, and bilirubin may be reactive  from the patient's recent surgery, however, he will have these reassessed on an outpatient basis.  Findings and plan of care discussed with Virgel Manifold, MD. Dr. Wilson Singer personally evaluated and examined this patient.  Vitals:   06/29/18 0815 06/29/18 0830 06/29/18 0845 06/29/18 0907  BP:    122/82  Pulse: 75 86 77 79  Resp: (!) 29 (!) 29 20 20   Temp:      SpO2: 98% 99% 98% 98%  Weight:      Height:         Final Clinical Impressions(s) / ED Diagnoses   Final diagnoses:  Postprocedural pneumothorax    ED Discharge Orders    None       Layla Maw 06/29/18 0240    Virgel Manifold, MD 06/30/18 1537

## 2018-06-29 NOTE — ED Provider Notes (Signed)
Medical screening examination/treatment/procedure(s) were conducted as a shared visit with non-physician practitioner(s) and myself.  I personally evaluated the patient during the encounter.  EKG Interpretation  Date/Time:  Tuesday June 29 2018 05:51:45 EDT Ventricular Rate:  75 PR Interval:    QRS Duration: 76 QT Interval:  402 QTC Calculation: 449 R Axis:   67 Text Interpretation:  Sinus rhythm Confirmed by Palumbo, April ([id number]) on 06/29/2018 6:09:49 AM  47yM with chest and dyspnea. On 06/23/18 had below procedure:   VIDEO BRONCHOSCOPY (N/A ) VIDEO ASSISTED THORACOSCOPY (VATS)/LUNG RESECTION, stapling and dissection of apical bleb, wedge resection of right upper lobe mass, lymph node dissection, intercostal nerve block (Right Chest)    Some increase in size of pneumothorax. HD stable. Seen in ED by CTS. Will see him again in the office on Thursday. LFTs noted. Just need to be trended at this time.    Virgel Manifold, MD 06/29/18 [phone]

## 2018-06-29 NOTE — ED Notes (Signed)
Cardiothoracic MD Gerhardt at bedside

## 2018-06-29 NOTE — Telephone Encounter (Signed)
-----   Message from Michel Bickers, MD sent at 06/28/2018 12:15 PM EDT ----- Please move [facility]iah [name]'s appointment up mid April.  He just had a right upper lobe granuloma resected.Jenny Reichmann

## 2018-06-29 NOTE — Discharge Instructions (Addendum)
Follow-up with Dr Servando Snare in his office this week as he discussed with you. Your labs today showed a mild elevations in your liver function tests. It is not to the point that I am overly concerned but I do want you to follow-up with your PCP to have repeat blood work in 1-2 weeks to monitor this.

## 2018-06-29 NOTE — ED Notes (Signed)
Patient transported to X-ray 

## 2018-06-29 NOTE — Progress Notes (Signed)
Patient ID: [name], male   DOB: 1970-11-10, 48 y.o.   MRN: [id number]                    [address]            [place] [address]          [phone]       Total Length of Stay:  LOS: 0 days   Subjective: Patient caME TO ER THIS AM BECAUSE OF LEFT CHEST INCISIONAL PAIN, went home ysterday  Objective: Vital signs in last 24 hours: Temp:  [98.4 F (36.9 C)] 98.4 F (36.9 C) (03/10 0550) Pulse Rate:  [69-86] 86 (03/10 0830) Cardiac Rhythm: Normal sinus rhythm (03/10 0609) Resp:  [18-29] 29 (03/10 0830) BP: (123-142)/(79-95) 126/79 (03/10 0645) SpO2:  [95 %-99 %] 99 % (03/10 0830) Weight:  [70.8 kg] 70.8 kg (03/10 0604)  Filed Weights   06/29/18 0604  Weight: 70.8 kg    Weight change:    Hemodynamic parameters for last 24 hours:    Intake/Output from previous day: No intake/output data recorded.  Intake/Output this shift: No intake/output data recorded.  Current Meds: Scheduled Meds: Continuous Infusions: PRN Meds:.  General appearance: alert and cooperative Neurologic: intact Heart: regular rate and rhythm, S1, S2 normal, no murmur, click, rub or gallop Lungs: clear to auscultation bilaterally Abdomen: soft, non-tender; bowel sounds normal; no masses,  no organomegaly Extremities: extremities normal, atraumatic, no cyanosis or edema and Homans sign is negative, no sign of DVT Wound: incisions intact, one chest tube site slightly opened but healing and with out infection, unchanged from yesterday  Lab Results: CBC: Recent Labs    06/29/18 0608  WBC 5.0  HGB 13.4  HCT 38.2*  PLT 115*   BMET:  Recent Labs    06/29/18 0608  NA 134*  K 4.4  CL 102  CO2 23  GLUCOSE 82  BUN 11  CREATININE 0.93  CALCIUM 8.8*    CMET: Lab Results  Component Value Date   WBC 5.0 06/29/2018   HGB 13.4 06/29/2018   HCT 38.2 (L) 06/29/2018   PLT 115 (L) 06/29/2018   GLUCOSE 82 06/29/2018   CHOL 186 05/07/2018   TRIG 234 (H)  05/07/2018   HDL 63 05/07/2018   LDLCALC 91 05/07/2018   ALT 288 (H) 06/29/2018   AST 263 (H) 06/29/2018   NA 134 (L) 06/29/2018   K 4.4 06/29/2018   CL 102 06/29/2018   CREATININE 0.93 06/29/2018   BUN 11 06/29/2018   CO2 23 06/29/2018   INR 1.0 06/21/2018      PT/INR: No results for input(s): LABPROT, INR in the last 72 hours. Radiology: Dg Chest 2 View  Result Date: 06/29/2018 CLINICAL DATA:  Chest pain and shortness of breath EXAM: CHEST - 2 VIEW COMPARISON:  06/28/2018 FINDINGS: Postsurgical changes are again noted in the right apex. The previously seen right-sided pneumothorax has increased slightly in the interval from the prior exam. There is now approximately 4 cm excursion from the chest wall superiorly. Bibasilar atelectatic changes are noted increased from the prior exam. The left lung shows no pneumothorax. Right chest wall port is again seen and stable. Cardiac shadow is stable. IMPRESSION: Increasing right-sided pneumothorax. Increased bibasilar atelectasis. Critical Value/emergent results were called by telephone at the time of interpretation on 06/29/2018 at 6:37 am to Dr. Veatrice Kells , who verbally acknowledged these results. Electronically Signed   By: Linus Mako.D.  On: 06/29/2018 06:37   I have independently reviewed the above radiology studies  and reviewed the findings with the patient.   Assessment/Plan: Slight increase in right apical ptx since yesterday , incisions intact Currently no reason to admit patient , follow up in office Thursday with chest xray,  Reviewed with patient postop pain control No evidence of infection in lung or incisions  I have seen and examined [name] and agree with the above assessment  and plan.  Grace Isaac MD [facility] [phone] Office [phone] 06/29/2018 8:58 AM      Grace Isaac 06/29/2018 8:51 AM

## 2018-06-30 ENCOUNTER — Other Ambulatory Visit: Payer: Self-pay

## 2018-06-30 ENCOUNTER — Inpatient Hospital Stay (HOSPITAL_COMMUNITY)
Admission: EM | Admit: 2018-06-30 | Discharge: 2018-07-09 | DRG: 200 | Disposition: A | Discharge status: Home / Self Care | Payer: Medicaid Other | Attending: Cardiothoracic Surgery | Admitting: Cardiothoracic Surgery

## 2018-06-30 ENCOUNTER — Emergency Department (HOSPITAL_COMMUNITY): Payer: Medicaid Other

## 2018-06-30 ENCOUNTER — Encounter (HOSPITAL_COMMUNITY): Payer: Self-pay | Admitting: Emergency Medicine

## 2018-06-30 ENCOUNTER — Other Ambulatory Visit: Payer: Self-pay | Admitting: Cardiothoracic Surgery

## 2018-06-30 ENCOUNTER — Inpatient Hospital Stay (HOSPITAL_COMMUNITY): Payer: Medicaid Other

## 2018-06-30 DIAGNOSIS — Z882 Allergy status to sulfonamides status: Secondary | ICD-10-CM

## 2018-06-30 DIAGNOSIS — Z886 Allergy status to analgesic agent status: Secondary | ICD-10-CM

## 2018-06-30 DIAGNOSIS — F32A Depression, unspecified: Secondary | ICD-10-CM | POA: Diagnosis present

## 2018-06-30 DIAGNOSIS — K5900 Constipation, unspecified: Secondary | ICD-10-CM | POA: Diagnosis not present

## 2018-06-30 DIAGNOSIS — Y838 Other surgical procedures as the cause of abnormal reaction of the patient, or of later complication, without mention of misadventure at the time of the procedure: Secondary | ICD-10-CM | POA: Diagnosis not present

## 2018-06-30 DIAGNOSIS — E785 Hyperlipidemia, unspecified: Secondary | ICD-10-CM | POA: Diagnosis present

## 2018-06-30 DIAGNOSIS — Z85048 Personal history of other malignant neoplasm of rectum, rectosigmoid junction, and anus: Secondary | ICD-10-CM

## 2018-06-30 DIAGNOSIS — Z9079 Acquired absence of other genital organ(s): Secondary | ICD-10-CM | POA: Diagnosis not present

## 2018-06-30 DIAGNOSIS — Z9689 Presence of other specified functional implants: Secondary | ICD-10-CM

## 2018-06-30 DIAGNOSIS — G8929 Other chronic pain: Secondary | ICD-10-CM | POA: Diagnosis not present

## 2018-06-30 DIAGNOSIS — J90 Pleural effusion, not elsewhere classified: Secondary | ICD-10-CM | POA: Diagnosis present

## 2018-06-30 DIAGNOSIS — Z79899 Other long term (current) drug therapy: Secondary | ICD-10-CM

## 2018-06-30 DIAGNOSIS — Z8701 Personal history of pneumonia (recurrent): Secondary | ICD-10-CM | POA: Diagnosis not present

## 2018-06-30 DIAGNOSIS — Z902 Acquired absence of lung [part of]: Secondary | ICD-10-CM

## 2018-06-30 DIAGNOSIS — Z8709 Personal history of other diseases of the respiratory system: Secondary | ICD-10-CM | POA: Diagnosis not present

## 2018-06-30 DIAGNOSIS — Z881 Allergy status to other antibiotic agents status: Secondary | ICD-10-CM | POA: Diagnosis not present

## 2018-06-30 DIAGNOSIS — B20 Human immunodeficiency virus [HIV] disease: Secondary | ICD-10-CM | POA: Diagnosis not present

## 2018-06-30 DIAGNOSIS — J449 Chronic obstructive pulmonary disease, unspecified: Secondary | ICD-10-CM | POA: Diagnosis present

## 2018-06-30 DIAGNOSIS — F17210 Nicotine dependence, cigarettes, uncomplicated: Secondary | ICD-10-CM | POA: Diagnosis present

## 2018-06-30 DIAGNOSIS — J95811 Postprocedural pneumothorax: Principal | ICD-10-CM | POA: Diagnosis present

## 2018-06-30 DIAGNOSIS — J939 Pneumothorax, unspecified: Secondary | ICD-10-CM | POA: Diagnosis present

## 2018-06-30 DIAGNOSIS — J8410 Pulmonary fibrosis, unspecified: Secondary | ICD-10-CM | POA: Diagnosis not present

## 2018-06-30 DIAGNOSIS — Z95828 Presence of other vascular implants and grafts: Secondary | ICD-10-CM | POA: Diagnosis not present

## 2018-06-30 DIAGNOSIS — C210 Malignant neoplasm of anus, unspecified: Secondary | ICD-10-CM | POA: Diagnosis present

## 2018-06-30 DIAGNOSIS — R918 Other nonspecific abnormal finding of lung field: Secondary | ICD-10-CM

## 2018-06-30 DIAGNOSIS — K029 Dental caries, unspecified: Secondary | ICD-10-CM | POA: Diagnosis present

## 2018-06-30 DIAGNOSIS — R079 Chest pain, unspecified: Secondary | ICD-10-CM | POA: Diagnosis present

## 2018-06-30 DIAGNOSIS — Z978 Presence of other specified devices: Secondary | ICD-10-CM | POA: Diagnosis not present

## 2018-06-30 DIAGNOSIS — Z21 Asymptomatic human immunodeficiency virus [HIV] infection status: Secondary | ICD-10-CM | POA: Diagnosis not present

## 2018-06-30 DIAGNOSIS — F329 Major depressive disorder, single episode, unspecified: Secondary | ICD-10-CM | POA: Diagnosis present

## 2018-06-30 HISTORY — PX: CHEST TUBE INSERTION (ARMC HX): HXRAD1692

## 2018-06-30 HISTORY — DX: Pneumothorax, unspecified: J93.9

## 2018-06-30 LAB — CBC
HEMATOCRIT: 41.2 % (ref 39.0–52.0)
HEMOGLOBIN: 14 g/dL (ref 13.0–17.0)
MCH: 36.3 pg — ABNORMAL HIGH (ref 26.0–34.0)
MCHC: 34 g/dL (ref 30.0–36.0)
MCV: 106.7 fL — ABNORMAL HIGH (ref 80.0–100.0)
Platelets: 131 10*3/uL — ABNORMAL LOW (ref 150–400)
RBC: 3.86 MIL/uL — ABNORMAL LOW (ref 4.22–5.81)
RDW: 11.9 % (ref 11.5–15.5)
WBC: 9.3 10*3/uL (ref 4.0–10.5)
nRBC: 0 % (ref 0.0–0.2)

## 2018-06-30 LAB — BASIC METABOLIC PANEL
Anion gap: 7 (ref 5–15)
BUN: 14 mg/dL (ref 6–20)
CO2: 27 mmol/L (ref 22–32)
Calcium: 9.2 mg/dL (ref 8.9–10.3)
Chloride: 105 mmol/L (ref 98–111)
Creatinine, Ser: 1.03 mg/dL (ref 0.61–1.24)
GFR calc Af Amer: >60 mL/min (ref >60)
GFR calc non Af Amer: >60 mL/min (ref >60)
Glucose, Bld: 113 mg/dL — ABNORMAL HIGH (ref 70–99)
POTASSIUM: 4.4 mmol/L (ref 3.5–5.1)
Sodium: 139 mmol/L (ref 135–145)

## 2018-06-30 LAB — TROPONIN I: Troponin I: <0.03 ng/mL (ref <0.03)

## 2018-06-30 MED ORDER — CLONIDINE HCL 0.1 MG PO TABS
0.2000 mg | ORAL_TABLET | Freq: Two times a day (BID) | ORAL | Status: DC
  Administered 2018-07-01 – 2018-07-09 (×18): 0.2 mg via ORAL
  Filled 2018-06-30 (×19): qty 2

## 2018-06-30 MED ORDER — MORPHINE SULFATE (PF) 4 MG/ML IV SOLN
4.0000 mg | Freq: Once | INTRAVENOUS | Status: AC
  Administered 2018-06-30: 4 mg via INTRAVENOUS
  Filled 2018-06-30: qty 1

## 2018-06-30 MED ORDER — OXYCODONE HCL 5 MG PO TABS
5.0000 mg | ORAL_TABLET | ORAL | Status: DC | PRN
  Administered 2018-06-30 – 2018-07-01 (×4): 10 mg via ORAL
  Administered 2018-07-01: 5 mg via ORAL
  Administered 2018-07-01 – 2018-07-09 (×38): 10 mg via ORAL
  Filled 2018-06-30 (×44): qty 2

## 2018-06-30 MED ORDER — BICTEGRAVIR-EMTRICITAB-TENOFOV 50-200-25 MG PO TABS
1.0000 | ORAL_TABLET | Freq: Every day | ORAL | Status: DC
  Administered 2018-07-01 – 2018-07-09 (×9): 1 via ORAL
  Filled 2018-06-30 (×9): qty 1

## 2018-06-30 MED ORDER — SODIUM CHLORIDE 0.9% FLUSH
3.0000 mL | Freq: Once | INTRAVENOUS | Status: AC
  Administered 2018-07-08: 3 mL via INTRAVENOUS

## 2018-06-30 MED ORDER — ONDANSETRON HCL 4 MG/2ML IJ SOLN
4.0000 mg | Freq: Once | INTRAMUSCULAR | Status: AC
  Administered 2018-06-30: 4 mg via INTRAVENOUS
  Filled 2018-06-30: qty 2

## 2018-06-30 MED ORDER — FENTANYL CITRATE (PF) 100 MCG/2ML IJ SOLN
25.0000 ug | INTRAMUSCULAR | Status: DC | PRN
  Administered 2018-06-30 – 2018-07-08 (×56): 50 ug via INTRAVENOUS
  Filled 2018-06-30 (×57): qty 2

## 2018-06-30 MED ORDER — ACETAMINOPHEN 160 MG/5ML PO SOLN: 1000.0000 mg | Freq: Four times a day (QID) | ORAL | Status: AC

## 2018-06-30 MED ORDER — ACETAMINOPHEN 500 MG PO TABS
1000.0000 mg | ORAL_TABLET | Freq: Four times a day (QID) | ORAL | Status: AC
  Administered 2018-06-30 – 2018-07-05 (×18): 1000 mg via ORAL
  Filled 2018-06-30 (×20): qty 2

## 2018-06-30 MED ORDER — ENOXAPARIN SODIUM 40 MG/0.4ML ~~LOC~~ SOLN
40.0000 mg | Freq: Every day | SUBCUTANEOUS | Status: DC
  Administered 2018-07-01 – 2018-07-04 (×4): 40 mg via SUBCUTANEOUS
  Filled 2018-06-30 (×4): qty 0.4

## 2018-06-30 MED ORDER — ALBUTEROL SULFATE (2.5 MG/3ML) 0.083% IN NEBU: 2.5000 mg | INHALATION_SOLUTION | Freq: Four times a day (QID) | RESPIRATORY_TRACT | Status: DC | PRN

## 2018-06-30 MED ORDER — ONDANSETRON HCL 4 MG/2ML IJ SOLN: 4.0000 mg | Freq: Four times a day (QID) | INTRAMUSCULAR | Status: DC | PRN

## 2018-06-30 MED ORDER — BISACODYL 5 MG PO TBEC
10.0000 mg | DELAYED_RELEASE_TABLET | Freq: Every day | ORAL | Status: DC
  Administered 2018-06-30 – 2018-07-05 (×5): 10 mg via ORAL
  Filled 2018-06-30 (×9): qty 2

## 2018-06-30 MED ORDER — KCL IN DEXTROSE-NACL 20-5-0.2 MEQ/L-%-% IV SOLN
INTRAVENOUS | Status: DC
  Administered 2018-07-01 – 2018-07-05 (×3): via INTRAVENOUS
  Filled 2018-06-30 (×2): qty 1000

## 2018-06-30 MED ORDER — LIDOCAINE HCL 2 % IJ SOLN
INTRAMUSCULAR | Status: AC
  Administered 2018-06-30: 18:00:00
  Filled 2018-06-30: qty 20

## 2018-06-30 MED ORDER — TRAMADOL HCL 50 MG PO TABS
50.0000 mg | ORAL_TABLET | Freq: Four times a day (QID) | ORAL | Status: DC | PRN
  Administered 2018-07-05 – 2018-07-09 (×12): 100 mg via ORAL
  Filled 2018-06-30 (×12): qty 2

## 2018-06-30 MED ORDER — SENNOSIDES-DOCUSATE SODIUM 8.6-50 MG PO TABS
1.0000 | ORAL_TABLET | Freq: Every day | ORAL | Status: DC
  Administered 2018-06-30 – 2018-07-06 (×4): 1 via ORAL
  Filled 2018-06-30 (×9): qty 1

## 2018-06-30 NOTE — ED Notes (Signed)
ISTAT TROPONIN NOT RAN, LAB PERSONNEL BROUGHT ME A LIGHT GREEN TUBE INSTEAD OF DARK GREEN. RN NOTIFIED

## 2018-06-30 NOTE — ED Notes (Signed)
ED TO INPATIENT HANDOFF REPORT  ED Nurse Name and Phone #: [phone]  S Name/Age/Gender Jeremiah [name] 48 y.o. male Room/Bed: WA16/WA16  Code Status   Code Status: Full Code  Home/SNF/Other Home Patient oriented to: self, place, time and situation Is this baseline? Yes   Triage Complete: Triage complete  Chief Complaint right flank pain;post surgical  Triage Note Per EMS, patient from home, c/o right flank pain and incision site. Hx lobectomy. Seen yesterday for same. Reports taking oxy x2 hours ago with little relief.  In triage, patient c/o constant right side chest pain with SOB x2 days.  BP 110/80 HR 104 RR 18 O2 98% CBG 108   Allergies Allergies  Allergen Reactions  . Nsaids Other (See Comments)    DRUG INTERACTION WITH HIV MEDS  . Sulfa Antibiotics Itching    Level of Care/Admitting Diagnosis ED Disposition    ED Disposition Condition [facility] Area: [place] [100100]  Level of Care: Progressive [102]  Diagnosis: Pneumothorax on left [956213]  Admitting Physician: Fanny Bien  Attending Physician: Fanny Bien  Estimated length of stay: 3 - 4 days  Certification:: I certify this patient will need inpatient services for at least 2 midnights  Bed request comments: 4E  PT Class (Do Not Modify): Inpatient [101]  PT Acc Code (Do Not Modify): Private [1]       B Medical/Surgery History Past Medical History:  Diagnosis Date  . Anal warts   . COPD (chronic obstructive pulmonary disease) ([place])   . Hemorrhoids 02/25/2015  . History of tuberculin skin testing within last year 2014  . HIV (human immunodeficiency virus infection) ([place])   . Pneumonia 2007   Past Surgical History:  Procedure Laterality Date  . HERNIA REPAIR  1991   Abdominal   . unilateral orchiectomy    . VIDEO ASSISTED THORACOSCOPY (VATS)/WEDGE RESECTION Right 06/23/2018   Procedure: VIDEO ASSISTED THORACOSCOPY  (VATS)/LUNG RESECTION, stapling and dissection of apical bleb, wedge resection of right upper lobe mass, lymph node dissection, intercostal nerve block;  Surgeon: Grace Isaac, MD;  Location: [place];  Service: Thoracic;  Laterality: Right;  Marland Kitchen VIDEO BRONCHOSCOPY N/A 06/23/2018   Procedure: VIDEO BRONCHOSCOPY;  Surgeon: Grace Isaac, MD;  Location: [place];  Service: Thoracic;  Laterality: N/A;     A IV Location/Drains/Wounds Patient Lines/Drains/Airways Status   Active Line/Drains/Airways    Name:   Placement date:   Placement time:   Site:   Days:   Implanted Port Right Chest   -    -    Chest      Peripheral IV 06/30/18 Left;Upper Forearm   06/30/18    1548    Forearm   less than 1   Chest Tube 1 Right;Lateral Pleural 14 Fr.   06/30/18    1740    Pleural   less than 1   Incision (Closed) 06/23/18 Chest Right   06/23/18    1109     7          Intake/Output Last 24 hours  Intake/Output Summary (Last 24 hours) at 06/30/2018 2154 Last data filed at 06/30/2018 1755 Gross per 24 hour  Intake -  Output 450 ml  Net -450 ml    Labs/Imaging Results for orders placed or performed during the hospital encounter of 06/30/18 (from the past 48 hour(s))  Basic metabolic panel     Status: Abnormal   Collection Time: 06/30/18  2:45 PM  Result Value Ref Range   Sodium 139 135 - 145 mmol/L   Potassium 4.4 3.5 - 5.1 mmol/L   Chloride 105 98 - 111 mmol/L   CO2 27 22 - 32 mmol/L   Glucose, Bld 113 (H) 70 - 99 mg/dL   BUN 14 6 - 20 mg/dL   Creatinine, Ser 1.03 0.61 - 1.24 mg/dL   Calcium 9.2 8.9 - 10.3 mg/dL   GFR calc non Af Amer >60 >60 mL/min   GFR calc Af Amer >60 >60 mL/min   Anion gap 7 5 - 15    Comment: Performed at [facility], [place] [address], [place], [place] [address]  CBC     Status: Abnormal   Collection Time: 06/30/18  2:45 PM  Result Value Ref Range   WBC 9.3 4.0 - 10.5 K/uL   RBC 3.86 (L) 4.22 - 5.81 MIL/uL   Hemoglobin 14.0 13.0 - 17.0 g/dL   HCT  41.2 39.0 - 52.0 %   MCV 106.7 (H) 80.0 - 100.0 fL   MCH 36.3 (H) 26.0 - 34.0 pg   MCHC 34.0 30.0 - 36.0 g/dL   RDW 11.9 11.5 - 15.5 %   Platelets 131 (L) 150 - 400 K/uL   nRBC 0.0 0.0 - 0.2 %    Comment: Performed at [facility], [place] [address] [place], [place] [address]  Troponin I - Add-On to previous collection     Status: None   Collection Time: 06/30/18  2:45 PM  Result Value Ref Range   Troponin I <0.03 <0.03 ng/mL    Comment: Performed at [facility], [place] [address], [place], [place] [address]   Dg Chest 2 View  Result Date: 06/30/2018 CLINICAL DATA:  Acute onset of shortness of breath and right-sided chest pain. EXAM: CHEST - 2 VIEW COMPARISON:  Chest radiograph performed 06/29/2018 FINDINGS: There is been interval increase in the size of the patient's large right-sided pneumothorax, involving approximately 40% of the right hemithorax. A small right pleural effusion is noted. Worsening right basilar airspace opacity and persistent mild left basilar opacity likely reflects atelectasis, though would correlate clinically to exclude pneumonia. A right-sided chest port is noted ending about the proximal right atrium. The heart is normal in size; the mediastinal contour is within normal limits. No acute osseous abnormalities are seen. Scattered soft tissue air is noted at the right lower chest wall, similar in appearance to the prior study. IMPRESSION: 1. Interval increase in size of large right-sided pneumothorax, involving approximately 40% of the right hemithorax. Small right pleural effusion noted. 2. Worsening right basilar airspace opacity and persistent mild left basilar opacity likely reflects atelectasis, though would correlate clinically to exclude pneumonia. 3. Scattered soft tissue air at the right lower chest wall, similar in appearance to the recent prior study. Critical Value/emergent results were called by telephone at the time of  interpretation on 06/30/2018 at 2:48 pm to Dr. Aletta Edouard, who verbally acknowledged these results. Electronically Signed   By: Garald Balding M.D.   On: 06/30/2018 14:50   Dg Chest 2 View  Result Date: 06/29/2018 CLINICAL DATA:  Chest pain and shortness of breath EXAM: CHEST - 2 VIEW COMPARISON:  06/28/2018 FINDINGS: Postsurgical changes are again noted in the right apex. The previously seen right-sided pneumothorax has increased slightly in the interval from the prior exam. There is now approximately 4 cm excursion from the chest wall superiorly. Bibasilar atelectatic changes are noted increased  from the prior exam. The left lung shows no pneumothorax. Right chest wall port is again seen and stable. Cardiac shadow is stable. IMPRESSION: Increasing right-sided pneumothorax. Increased bibasilar atelectasis. Critical Value/emergent results were called by telephone at the time of interpretation on 06/29/2018 at 6:37 am to Dr. Veatrice Kells , who verbally acknowledged these results. Electronically Signed   By: Inez Catalina M.D.   On: 06/29/2018 06:37   Dg Chest Port 1 View  Result Date: 06/30/2018 CLINICAL DATA:  Right chest tube placement. EXAM: PORTABLE CHEST 1 VIEW COMPARISON:  Chest radiograph performed earlier today at 5:36 p.m. FINDINGS: The patient's right-sided pneumothorax has decreased mildly in size. The right basilar pigtail catheter is unchanged in appearance. Bibasilar airspace opacities again likely reflect atelectasis. No significant pleural effusion is seen. The cardiomediastinal silhouette is normal in size. A right-sided chest port is noted ending about the right atrium. No acute osseous abnormalities are identified. IMPRESSION: 1. Right-sided pneumothorax has decreased mildly in size. Right basilar pigtail catheter is unchanged in appearance. 2. Bibasilar airspace opacities again likely reflect atelectasis. Electronically Signed   By: Garald Balding M.D.   On: 06/30/2018 18:51   Dg Chest  Portable 1 View  Result Date: 06/30/2018 CLINICAL DATA:  Chest tube placement EXAM: PORTABLE CHEST 1 VIEW COMPARISON:  06/30/2018 at 1433 hours FINDINGS: Right hydropneumothorax with a moderate pneumothorax component and a small residual pleural fluid component. Indwelling pigtail chest tube. Bilateral lower lobe atelectasis. Right chest port terminates in the upper right atrium. The heart is normal in size. IMPRESSION: Moderate right hydropneumothorax with indwelling pigtail chest tube, as above. Electronically Signed   By: Julian Hy M.D.   On: 06/30/2018 18:10    Pending Labs Unresulted Labs (From admission, onward)    Start     Ordered   07/02/18 0500  CBC  Once,   R     06/30/18 1810   07/02/18 0500  Comprehensive metabolic panel  Once,   R     06/30/18 1810   07/01/18 0500  CBC  Tomorrow morning,   R     06/30/18 1810   07/01/18 4034  Basic metabolic panel  Tomorrow morning,   R     06/30/18 1810   06/30/18 1759  Body fluid culture  Once,   STAT    Question:  Patient immune status  Answer:  Normal   06/30/18 1759          Vitals/Pain Today's Vitals   06/30/18 1919 06/30/18 2004 06/30/18 2015 06/30/18 2104  BP:  (!) 140/107 (!) 142/104 (!) 145/108  Pulse: 95 86 95 (!) 101  Resp: (!) 23 (!) 23 19 20   Temp:    98.6 F (37 C)  TempSrc:    Oral  SpO2: (S) 95% 97% 96% 95%  Weight:      Height:      PainSc:    8     Isolation Precautions No active isolations  Medications Medications  sodium chloride flush (NS) 0.9 % injection 3 mL (0 mLs Intravenous Hold 06/30/18 1524)  acetaminophen (TYLENOL) tablet 1,000 mg (has no administration in time range)    Or  acetaminophen (TYLENOL) solution 1,000 mg (has no administration in time range)  bisacodyl (DULCOLAX) EC tablet 10 mg (has no administration in time range)  senna-docusate (Senokot-S) tablet 1 tablet (has no administration in time range)  ondansetron (ZOFRAN) injection 4 mg (has no administration in time range)   enoxaparin (LOVENOX) injection 40 mg (has  no administration in time range)  dextrose 5 % and 0.2 % NaCl with KCl 20 mEq infusion (has no administration in time range)  traMADol (ULTRAM) tablet 50-100 mg (has no administration in time range)  oxyCODONE (Oxy IR/ROXICODONE) immediate release tablet 5-10 mg (has no administration in time range)  fentaNYL (SUBLIMAZE) injection 25-50 mcg (50 mcg Intravenous Given 06/30/18 2112)  albuterol (PROVENTIL) (2.5 MG/3ML) 0.083% nebulizer solution 2.5 mg (has no administration in time range)  bictegravir-emtricitabine-tenofovir AF (BIKTARVY) 50-200-25 MG per tablet 1 tablet (has no administration in time range)  cloNIDine (CATAPRES) tablet 0.2 mg (has no administration in time range)  morphine 4 MG/ML injection 4 mg (4 mg Intravenous Given 06/30/18 1550)  ondansetron (ZOFRAN) injection 4 mg (4 mg Intravenous Given 06/30/18 1550)  morphine 4 MG/ML injection 4 mg (4 mg Intravenous Given by Other 06/30/18 1712)  lidocaine (XYLOCAINE) 2 % (with pres) injection (  Given 06/30/18 1731)    Mobility walks with person assist Low fall risk   Focused Assessments Pulmonary Assessment Handoff:  Lung sounds:   O2 Device: Nasal Cannula O2 Flow Rate (L/min): 4 L/min      R Recommendations: See Admitting Provider Note  Report given to:   Additional Notes: chest tube right chest WNL ED TO INPATIENT HANDOFF REPORT  ED Nurse Name and Phone #: Mortimer Fries [phone]  S Name/Age/Gender Jeremiah [name] 48 y.o. male Room/Bed: WOTF/NONE  Code Status   Code Status: Full Code  Home/SNF/Other Home Patient oriented to: self, place, time and situation Is this baseline? Yes   Triage Complete: Triage complete  Chief Complaint right flank pain;post surgical  Triage Note Per EMS, patient from home, c/o right flank pain and incision site. Hx lobectomy. Seen yesterday for same. Reports taking oxy x2 hours ago with little relief.  In triage, patient c/o constant right  side chest pain with SOB x2 days.  BP 110/80 HR 104 RR 18 O2 98% CBG 108   Allergies Allergies  Allergen Reactions  . Nsaids Other (See Comments)    DRUG INTERACTION WITH HIV MEDS  . Sulfa Antibiotics Itching    Level of Care/Admitting Diagnosis ED Disposition    ED Disposition Condition [facility] Area: [place] [100100]  Level of Care: Progressive [102]  Diagnosis: Pneumothorax on left [025852]  Admitting Physician: Fanny Bien  Attending Physician: Fanny Bien  Estimated length of stay: 3 - 4 days  Certification:: I certify this patient will need inpatient services for at least 2 midnights  Bed request comments: 4E  PT Class (Do Not Modify): Inpatient [101]  PT Acc Code (Do Not Modify): Private [1]       B Medical/Surgery History Past Medical History:  Diagnosis Date  . Anal warts   . COPD (chronic obstructive pulmonary disease) ([place])   . Hemorrhoids 02/25/2015  . History of tuberculin skin testing within last year 2014  . HIV (human immunodeficiency virus infection) ([place])   . Pneumonia 2007   Past Surgical History:  Procedure Laterality Date  . HERNIA REPAIR  1991   Abdominal   . unilateral orchiectomy    . VIDEO ASSISTED THORACOSCOPY (VATS)/WEDGE RESECTION Right 06/23/2018   Procedure: VIDEO ASSISTED THORACOSCOPY (VATS)/LUNG RESECTION, stapling and dissection of apical bleb, wedge resection of right upper lobe mass, lymph node dissection, intercostal nerve block;  Surgeon: Grace Isaac, MD;  Location: [place];  Service: Thoracic;  Laterality: Right;  Marland Kitchen VIDEO BRONCHOSCOPY N/A 06/23/2018  Procedure: VIDEO BRONCHOSCOPY;  Surgeon: Grace Isaac, MD;  Location: [place];  Service: Thoracic;  Laterality: N/A;     A IV Location/Drains/Wounds Patient Lines/Drains/Airways Status   Active Line/Drains/Airways    Name:   Placement date:   Placement time:   Site:   Days:   Implanted Port Right Chest    -    -    Chest      Peripheral IV 06/30/18 Left;Upper Forearm   06/30/18    1548    Forearm   less than 1   Chest Tube 1 Right;Lateral Pleural 14 Fr.   06/30/18    1740    Pleural   less than 1   Incision (Closed) 06/23/18 Chest Right   06/23/18    1109     7          Intake/Output Last 24 hours  Intake/Output Summary (Last 24 hours) at 06/30/2018 2155 Last data filed at 06/30/2018 1755 Gross per 24 hour  Intake -  Output 450 ml  Net -450 ml    Labs/Imaging Results for orders placed or performed during the hospital encounter of 06/30/18 (from the past 48 hour(s))  Basic metabolic panel     Status: Abnormal   Collection Time: 06/30/18  2:45 PM  Result Value Ref Range   Sodium 139 135 - 145 mmol/L   Potassium 4.4 3.5 - 5.1 mmol/L   Chloride 105 98 - 111 mmol/L   CO2 27 22 - 32 mmol/L   Glucose, Bld 113 (H) 70 - 99 mg/dL   BUN 14 6 - 20 mg/dL   Creatinine, Ser 1.03 0.61 - 1.24 mg/dL   Calcium 9.2 8.9 - 10.3 mg/dL   GFR calc non Af Amer >60 >60 mL/min   GFR calc Af Amer >60 >60 mL/min   Anion gap 7 5 - 15    Comment: Performed at [facility], [place] [address] [place], [place] [address]  CBC     Status: Abnormal   Collection Time: 06/30/18  2:45 PM  Result Value Ref Range   WBC 9.3 4.0 - 10.5 K/uL   RBC 3.86 (L) 4.22 - 5.81 MIL/uL   Hemoglobin 14.0 13.0 - 17.0 g/dL   HCT 41.2 39.0 - 52.0 %   MCV 106.7 (H) 80.0 - 100.0 fL   MCH 36.3 (H) 26.0 - 34.0 pg   MCHC 34.0 30.0 - 36.0 g/dL   RDW 11.9 11.5 - 15.5 %   Platelets 131 (L) 150 - 400 K/uL   nRBC 0.0 0.0 - 0.2 %    Comment: Performed at [facility], [place] [address], [place], [place] [address]  Troponin I - Add-On to previous collection     Status: None   Collection Time: 06/30/18  2:45 PM  Result Value Ref Range   Troponin I <0.03 <0.03 ng/mL    Comment: Performed at [facility], [place] [address] [place], [place] [address]   Dg Chest 2 View  Result Date:  06/30/2018 CLINICAL DATA:  Acute onset of shortness of breath and right-sided chest pain. EXAM: CHEST - 2 VIEW COMPARISON:  Chest radiograph performed 06/29/2018 FINDINGS: There is been interval increase in the size of the patient's large right-sided pneumothorax, involving approximately 40% of the right hemithorax. A small right pleural effusion is noted. Worsening right basilar airspace opacity and persistent mild left basilar opacity likely reflects atelectasis, though would correlate clinically to exclude pneumonia. A right-sided chest port is  noted ending about the proximal right atrium. The heart is normal in size; the mediastinal contour is within normal limits. No acute osseous abnormalities are seen. Scattered soft tissue air is noted at the right lower chest wall, similar in appearance to the prior study. IMPRESSION: 1. Interval increase in size of large right-sided pneumothorax, involving approximately 40% of the right hemithorax. Small right pleural effusion noted. 2. Worsening right basilar airspace opacity and persistent mild left basilar opacity likely reflects atelectasis, though would correlate clinically to exclude pneumonia. 3. Scattered soft tissue air at the right lower chest wall, similar in appearance to the recent prior study. Critical Value/emergent results were called by telephone at the time of interpretation on 06/30/2018 at 2:48 pm to Dr. Aletta Edouard, who verbally acknowledged these results. Electronically Signed   By: Garald Balding M.D.   On: 06/30/2018 14:50   Dg Chest 2 View  Result Date: 06/29/2018 CLINICAL DATA:  Chest pain and shortness of breath EXAM: CHEST - 2 VIEW COMPARISON:  06/28/2018 FINDINGS: Postsurgical changes are again noted in the right apex. The previously seen right-sided pneumothorax has increased slightly in the interval from the prior exam. There is now approximately 4 cm excursion from the chest wall superiorly. Bibasilar atelectatic changes are noted  increased from the prior exam. The left lung shows no pneumothorax. Right chest wall port is again seen and stable. Cardiac shadow is stable. IMPRESSION: Increasing right-sided pneumothorax. Increased bibasilar atelectasis. Critical Value/emergent results were called by telephone at the time of interpretation on 06/29/2018 at 6:37 am to Dr. Veatrice Kells , who verbally acknowledged these results. Electronically Signed   By: Inez Catalina M.D.   On: 06/29/2018 06:37   Dg Chest Port 1 View  Result Date: 06/30/2018 CLINICAL DATA:  Right chest tube placement. EXAM: PORTABLE CHEST 1 VIEW COMPARISON:  Chest radiograph performed earlier today at 5:36 p.m. FINDINGS: The patient's right-sided pneumothorax has decreased mildly in size. The right basilar pigtail catheter is unchanged in appearance. Bibasilar airspace opacities again likely reflect atelectasis. No significant pleural effusion is seen. The cardiomediastinal silhouette is normal in size. A right-sided chest port is noted ending about the right atrium. No acute osseous abnormalities are identified. IMPRESSION: 1. Right-sided pneumothorax has decreased mildly in size. Right basilar pigtail catheter is unchanged in appearance. 2. Bibasilar airspace opacities again likely reflect atelectasis. Electronically Signed   By: Garald Balding M.D.   On: 06/30/2018 18:51   Dg Chest Portable 1 View  Result Date: 06/30/2018 CLINICAL DATA:  Chest tube placement EXAM: PORTABLE CHEST 1 VIEW COMPARISON:  06/30/2018 at 1433 hours FINDINGS: Right hydropneumothorax with a moderate pneumothorax component and a small residual pleural fluid component. Indwelling pigtail chest tube. Bilateral lower lobe atelectasis. Right chest port terminates in the upper right atrium. The heart is normal in size. IMPRESSION: Moderate right hydropneumothorax with indwelling pigtail chest tube, as above. Electronically Signed   By: Julian Hy M.D.   On: 06/30/2018 18:10    Pending  Labs Unresulted Labs (From admission, onward)    Start     Ordered   07/02/18 0500  CBC  Once,   R     06/30/18 1810   07/02/18 0500  Comprehensive metabolic panel  Once,   R     06/30/18 1810   07/01/18 0500  CBC  Tomorrow morning,   R     06/30/18 1810   07/01/18 9983  Basic metabolic panel  Tomorrow morning,   R  06/30/18 1810   06/30/18 1759  Body fluid culture  Once,   STAT    Question:  Patient immune status  Answer:  Normal   06/30/18 1759          Vitals/Pain Today's Vitals   06/30/18 1919 06/30/18 2004 06/30/18 2015 06/30/18 2104  BP:  (!) 140/107 (!) 142/104 (!) 145/108  Pulse: 95 86 95 (!) 101  Resp: (!) 23 (!) 23 19 20   Temp:    98.6 F (37 C)  TempSrc:    Oral  SpO2: (S) 95% 97% 96% 95%  Weight:      Height:      PainSc:    8     Isolation Precautions No active isolations  Medications Medications  sodium chloride flush (NS) 0.9 % injection 3 mL (0 mLs Intravenous Hold 06/30/18 1524)  acetaminophen (TYLENOL) tablet 1,000 mg (has no administration in time range)    Or  acetaminophen (TYLENOL) solution 1,000 mg (has no administration in time range)  bisacodyl (DULCOLAX) EC tablet 10 mg (has no administration in time range)  senna-docusate (Senokot-S) tablet 1 tablet (has no administration in time range)  ondansetron (ZOFRAN) injection 4 mg (has no administration in time range)  enoxaparin (LOVENOX) injection 40 mg (has no administration in time range)  dextrose 5 % and 0.2 % NaCl with KCl 20 mEq infusion (has no administration in time range)  traMADol (ULTRAM) tablet 50-100 mg (has no administration in time range)  oxyCODONE (Oxy IR/ROXICODONE) immediate release tablet 5-10 mg (has no administration in time range)  fentaNYL (SUBLIMAZE) injection 25-50 mcg (50 mcg Intravenous Given 06/30/18 2112)  albuterol (PROVENTIL) (2.5 MG/3ML) 0.083% nebulizer solution 2.5 mg (has no administration in time range)  bictegravir-emtricitabine-tenofovir AF (BIKTARVY)  50-200-25 MG per tablet 1 tablet (has no administration in time range)  cloNIDine (CATAPRES) tablet 0.2 mg (has no administration in time range)  morphine 4 MG/ML injection 4 mg (4 mg Intravenous Given 06/30/18 1550)  ondansetron (ZOFRAN) injection 4 mg (4 mg Intravenous Given 06/30/18 1550)  morphine 4 MG/ML injection 4 mg (4 mg Intravenous Given by Other 06/30/18 1712)  lidocaine (XYLOCAINE) 2 % (with pres) injection (  Given 06/30/18 1731)    Mobility walks Low fall risk   Focused Assessments Pulmonary Assessment Handoff:  Lung sounds:   O2 Device: Nasal Cannula O2 Flow Rate (L/min): 4 L/min      R Recommendations: See Admitting Provider Note  Report given to:   Additional Notes: chest tube right chest stable

## 2018-06-30 NOTE — ED Notes (Signed)
INFORMED CONSENT FOR RIGHT CHEST TUBE signed by patient, Dr. Festus Barren, and witnessed by Probation officer.

## 2018-06-30 NOTE — ED Notes (Signed)
Bed: WA16 Expected date:  Expected time:  Means of arrival:  Comments: RES A 

## 2018-06-30 NOTE — ED Notes (Signed)
[facility] called for transport 

## 2018-06-30 NOTE — ED Triage Notes (Addendum)
Per EMS, patient from home, c/o right flank pain and incision site. Hx lobectomy. Seen yesterday for same. Reports taking oxy x2 hours ago with little relief.  In triage, patient c/o constant right side chest pain with SOB x2 days.  BP 110/80 HR 104 RR 18 O2 98% CBG 108

## 2018-06-30 NOTE — ED Provider Notes (Signed)
[facility] DEPT Provider Note   CSN: [id number] Arrival date & time: 06/30/18  1359    History   Chief Complaint Chief Complaint  Patient presents with  . Chest Pain    HPI Jeremiah [name] is a 48 y.o. male.     Pt presents to the ED today with CP.  The pt had a VATS procedure by [name] on 3/4 for a suspicious lung nodule.  The pt was d/c home on 3/9.  He came to the ED yesterday for increasing CP.  CXR showed increasing size of right pneumothorax.  [name] saw him in the ED and thought the ptx did not need a chest tube.  Pt comes back today because of increasing pain.  He has an appt with CTS tomorrow, but could not wait.  Pt said it hurts to take a deep breath.     Past Medical History:  Diagnosis Date  . Anal warts   . COPD (chronic obstructive pulmonary disease) ([place])   . Hemorrhoids 02/25/2015  . History of tuberculin skin testing within last year 2014  . HIV (human immunodeficiency virus infection) ([place])   . Pneumonia 2007    Patient Active Problem List   Diagnosis Date Noted  . Pneumothorax on left 06/30/2018  . Mass of upper lobe of right lung 06/23/2018  . 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[place]) 01/06/2018  . Chronic pain 03/31/2016  . Anal cancer ([place]) 11/07/2015  . Hyperglycemia 11/07/2015  . Gonorrhea 10/30/2015  . CAP (community acquired pneumonia) 02/25/2015  . Hemorrhoids 02/25/2015  . Human immunodeficiency virus (HIV) disease ([place]) 05/19/2013  . Dyslipidemia 05/19/2013  . Cigarette smoker 05/19/2013  . Depression 05/19/2013  . Dental caries 05/19/2013  . Hx of unilateral orchiectomy 05/19/2013  . Latent syphilis 05/06/2013  . Anal warts 05/06/2013  . History of chlamydia 05/06/2013    Past Surgical History:  Procedure Laterality Date  . HERNIA REPAIR  1991   Abdominal   . unilateral orchiectomy    . VIDEO ASSISTED THORACOSCOPY (VATS)/WEDGE RESECTION Right 06/23/2018   Procedure: VIDEO ASSISTED THORACOSCOPY  (VATS)/LUNG RESECTION, stapling and dissection of apical bleb, wedge resection of right upper lobe mass, lymph node dissection, intercostal nerve block;  Surgeon: Grace Isaac, MD;  Location: [facility];  Service: Thoracic;  Laterality: Right;  Marland Kitchen VIDEO BRONCHOSCOPY N/A 06/23/2018   Procedure: VIDEO BRONCHOSCOPY;  Surgeon: Grace Isaac, MD;  Location: [facility] OR;  Service: Thoracic;  Laterality: N/A;        Home Medications    Prior to Admission medications   Medication Sig Start Date End Date Taking? Authorizing Provider  acetaminophen (TYLENOL 8 HOUR) 650 MG CR tablet Take 1 tablet (650 mg total) by mouth every 8 (eight) hours as needed. 06/02/18  Yes Varney Biles, MD  albuterol (PROVENTIL HFA;VENTOLIN HFA) 108 (90 Base) MCG/ACT inhaler Inhale 1-2 puffs into the lungs every 6 (six) hours as needed for wheezing or shortness of breath.   Yes [provider]  BIKTARVY 50-200-25 MG TABS tablet TAKE 1 TABLET BY MOUTH DAILY Patient taking differently: Take 1 tablet by mouth daily.  06/11/18  Yes Michel Bickers, MD  cloNIDine (CATAPRES) 0.2 MG tablet Take 1 tablet (0.2 mg total) by mouth 2 (two) times daily. 06/28/18  Yes Lars Pinks M, PA-C  oxyCODONE (OXY IR/ROXICODONE) 5 MG immediate release tablet Take 1 tablet (5 mg total) by mouth every 4 (four) hours as needed for severe pain. 06/28/18  Yes Nani Skillern, PA-C  Family History Family History  Problem Relation Age of Onset  . Diabetes Mother   . Hypertension Mother   . Cancer Mother        breast  . Diabetes Father   . Hypertension Father     Social History Social History   Tobacco Use  . Smoking status: Current Every Day Smoker    Packs/day: 1.00    Years: 24.00    Pack years: 24.00    Types: Cigarettes  . Smokeless tobacco: Never Used  Substance Use Topics  . Alcohol use: Yes    Alcohol/week: 0.0 standard drinks    Comment: less than 40 oz a week  . Drug use: No    Comment: Past history of crack  cocaine  use      Allergies   Nsaids and Sulfa antibiotics   Review of Systems Review of Systems  Respiratory: Positive for shortness of breath.   Cardiovascular: Positive for chest pain.  All other systems reviewed and are negative.    Physical Exam Updated Vital Signs BP (!) 115/100 (BP Location: Right Arm)   Pulse (!) 112   Temp 98.4 F (36.9 C) (Oral)   Resp (!) 23   Ht 5\' 11"  (1.803 m)   Wt 71.2 kg   SpO2 96%   BMI 21.90 kg/m   Physical Exam Vitals signs and nursing note reviewed.  Constitutional:      Appearance: He is well-developed.  HENT:     Head: Normocephalic and atraumatic.  Eyes:     Extraocular Movements: Extraocular movements intact.     Pupils: Pupils are equal, round, and reactive to light.  Neck:     Musculoskeletal: Normal range of motion and neck supple.  Cardiovascular:     Rate and Rhythm: Normal rate and regular rhythm.     Heart sounds: Normal heart sounds.  Pulmonary:     Effort: Pulmonary effort is normal.     Breath sounds: Examination of the right-upper field reveals decreased breath sounds. Decreased breath sounds present.  Abdominal:     General: Bowel sounds are normal.     Palpations: Abdomen is soft.  Musculoskeletal: Normal range of motion.  Skin:    General: Skin is warm and dry.     Capillary Refill: Capillary refill takes less than 2 seconds.  Neurological:     General: No focal deficit present.     Mental Status: He is alert and oriented to person, place, and time.  Psychiatric:        Mood and Affect: Mood normal.        Behavior: Behavior normal.      ED Treatments / Results  Labs (all labs ordered are listed, but only abnormal results are displayed) Labs Reviewed  BASIC METABOLIC PANEL - Abnormal; Notable for the following components:      Result Value   Glucose, Bld 113 (*)    All other components within normal limits  CBC - Abnormal; Notable for the following components:   RBC 3.86 (*)    MCV 106.7  (*)    MCH 36.3 (*)    Platelets 131 (*)    All other components within normal limits  BODY FLUID CULTURE  TROPONIN I  CBC  BASIC METABOLIC PANEL  I-STAT TROPONIN, ED    EKG None  Radiology Dg Chest 2 View  Result Date: 06/30/2018 CLINICAL DATA:  Acute onset of shortness of breath and right-sided chest pain. EXAM: CHEST - 2 VIEW COMPARISON:  Chest radiograph performed 06/29/2018 FINDINGS: There is been interval increase in the size of the patient's large right-sided pneumothorax, involving approximately 40% of the right hemithorax. A small right pleural effusion is noted. Worsening right basilar airspace opacity and persistent mild left basilar opacity likely reflects atelectasis, though would correlate clinically to exclude pneumonia. A right-sided chest port is noted ending about the proximal right atrium. The heart is normal in size; the mediastinal contour is within normal limits. No acute osseous abnormalities are seen. Scattered soft tissue air is noted at the right lower chest wall, similar in appearance to the prior study. IMPRESSION: 1. Interval increase in size of large right-sided pneumothorax, involving approximately 40% of the right hemithorax. Small right pleural effusion noted. 2. Worsening right basilar airspace opacity and persistent mild left basilar opacity likely reflects atelectasis, though would correlate clinically to exclude pneumonia. 3. Scattered soft tissue air at the right lower chest wall, similar in appearance to the recent prior study. Critical Value/emergent results were called by telephone at the time of interpretation on 06/30/2018 at 2:48 pm to Dr. Aletta Edouard, who verbally acknowledged these results. Electronically Signed   By: Garald Balding M.D.   On: 06/30/2018 14:50   Dg Chest 2 View  Result Date: 06/29/2018 CLINICAL DATA:  Chest pain and shortness of breath EXAM: CHEST - 2 VIEW COMPARISON:  06/28/2018 FINDINGS: Postsurgical changes are again noted in  the right apex. The previously seen right-sided pneumothorax has increased slightly in the interval from the prior exam. There is now approximately 4 cm excursion from the chest wall superiorly. Bibasilar atelectatic changes are noted increased from the prior exam. The left lung shows no pneumothorax. Right chest wall port is again seen and stable. Cardiac shadow is stable. IMPRESSION: Increasing right-sided pneumothorax. Increased bibasilar atelectasis. Critical Value/emergent results were called by telephone at the time of interpretation on 06/29/2018 at 6:37 am to Dr. Veatrice Kells , who verbally acknowledged these results. Electronically Signed   By: Inez Catalina M.D.   On: 06/29/2018 06:37   Dg Chest Portable 1 View  Result Date: 06/30/2018 CLINICAL DATA:  Chest tube placement EXAM: PORTABLE CHEST 1 VIEW COMPARISON:  06/30/2018 at 1433 hours FINDINGS: Right hydropneumothorax with a moderate pneumothorax component and a small residual pleural fluid component. Indwelling pigtail chest tube. Bilateral lower lobe atelectasis. Right chest port terminates in the upper right atrium. The heart is normal in size. IMPRESSION: Moderate right hydropneumothorax with indwelling pigtail chest tube, as above. Electronically Signed   By: Julian Hy M.D.   On: 06/30/2018 18:10    Procedures Procedures (including critical care time)  Medications Ordered in ED Medications  sodium chloride flush (NS) 0.9 % injection 3 mL (0 mLs Intravenous Hold 06/30/18 1524)  acetaminophen (TYLENOL) tablet 1,000 mg (has no administration in time range)    Or  acetaminophen (TYLENOL) solution 1,000 mg (has no administration in time range)  bisacodyl (DULCOLAX) EC tablet 10 mg (has no administration in time range)  senna-docusate (Senokot-S) tablet 1 tablet (has no administration in time range)  ondansetron (ZOFRAN) injection 4 mg (has no administration in time range)  enoxaparin (LOVENOX) injection 40 mg (has no  administration in time range)  dextrose 5 % and 0.2 % NaCl with KCl 20 mEq infusion (has no administration in time range)  traMADol (ULTRAM) tablet 50-100 mg (has no administration in time range)  oxyCODONE (Oxy IR/ROXICODONE) immediate release tablet 5-10 mg (has no administration in time range)  fentaNYL (SUBLIMAZE) injection 25-50  mcg (has no administration in time range)  albuterol (PROVENTIL HFA;VENTOLIN HFA) 108 (90 Base) MCG/ACT inhaler 1-2 puff (has no administration in time range)  bictegravir-emtricitabine-tenofovir AF (BIKTARVY) 50-200-25 MG per tablet 1 tablet (has no administration in time range)  cloNIDine (CATAPRES) tablet 0.2 mg (has no administration in time range)  morphine 4 MG/ML injection 4 mg (4 mg Intravenous Given 06/30/18 1550)  ondansetron (ZOFRAN) injection 4 mg (4 mg Intravenous Given 06/30/18 1550)  morphine 4 MG/ML injection 4 mg (4 mg Intravenous Given by Other 06/30/18 1712)  lidocaine (XYLOCAINE) 2 % (with pres) injection (  Given 06/30/18 1731)     Initial Impression / Assessment and Plan / ED Course  I have reviewed the triage vital signs and the nursing notes.  Pertinent labs & imaging results that were available during my care of the patient were reviewed by me and considered in my medical decision making (see chart for details).  CXR from today compared to yesterday.  Pt placed on oxygen.    Pt d/w Dr. Roxy Manns (on call for CTS) who recommended that I call [name] directly.  I spoke with [name] who feels that pt needs an apical chest tube.  He asked me to call IR to see if they can do it.  IR was in a procedure, and will call me back.  Dr. Anselm Pancoast called me back and can do it if needed, but can't for awhile.  [name] came and placed the chest tube and will admit to [facility].  CRITICAL CARE Performed by: Isla Pence   Total critical care time: 30  minutes  Critical care time was exclusive of separately billable procedures and treating other  patients.  Critical care was necessary to treat or prevent imminent or life-threatening deterioration.  Critical care was time spent personally by me on the following activities: development of treatment plan with patient and/or surrogate as well as nursing, discussions with consultants, evaluation of patient's response to treatment, examination of patient, obtaining history from patient or surrogate, ordering and performing treatments and interventions, ordering and review of laboratory studies, ordering and review of radiographic studies, pulse oximetry and re-evaluation of patient's condition.  Final Clinical Impressions(s) / ED Diagnoses   Final diagnoses:  Pneumothorax, unspecified type    ED Discharge Orders    None       Isla Pence, MD 06/30/18 1816

## 2018-06-30 NOTE — ED Notes (Signed)
Provided patient a cup of ice water with permission Dr. Festus Barren.

## 2018-06-30 NOTE — Progress Notes (Signed)
Pt received via carelink from [facility] long ED to [facility]. Initial assessment, vitals, CHG complete. Tele applied CCMD notified. Pt oriented to room and call bell system will continue to monitor.

## 2018-06-30 NOTE — ED Notes (Signed)
Pt was called for rooming by Raeanne Barry, NT. Rhoney found pt near bus stop. This RN and NT brought wheelchair to patient, and encouraged him to stay and told him he needs to be roomed. Pt cooperative and brought in wheelchair to room.

## 2018-06-30 NOTE — H&P (Signed)
[address]       [place], [address]             [phone]        Gal Vitelli [place] Medical Record #[id number] Date of Birth: [birth year]  Referring: No ref. provider found [facility] Primary Cardiologist:No primary care provider on file.  Chief Complaint:    Chief Complaint  Patient presents with  . Chest Pain    History of Present Illness:     Patient presents to the [facility] emergency room.  Last week underwent right video-assisted thoracoscopy wedge resection of mass and right upper lobe that was suspicious for primary lung cancer but turned out to be inflammatory mass, in addition the patient had a large bulla in the apex of his lung which were resected.  Chest tube was pulled out late last week with no air leak and stable chest x-ray he was discharged home on Monday.  Return to the [facility] emergency room yesterday with a small apical pneumothorax, and was to be seen back in the office tomorrow.  He came to the [facility] emergency room this afternoon with chest discomfort, chest x-ray showed increasing right effusion and some increasing right pneumothorax.  On review of the film it was decided to proceed with placement of chest tube, and transfer patient back to [facility].  Current Activity/ Functional Status: Patient is independent with mobility/ambulation, transfers, ADL's, IADL's.   Zubrod Score: At the time of surgery this patient's most appropriate activity status/level should be described as: [x]     0    Normal activity, no symptoms []     1    Restricted in physical strenuous activity but ambulatory, able to do out light work []     2    Ambulatory and capable of self care, unable to do work activities, up and about                 more than 50%  Of the time                            []     3    Only limited self care, in bed greater than 50% of waking hours []     4    Completely disabled, no  self care, confined to bed or chair []     5    Moribund  Past Medical History:  Diagnosis Date  . Anal warts   . COPD (chronic obstructive pulmonary disease) ([place])   . Hemorrhoids 02/25/2015  . History of tuberculin skin testing within last year 2014  . HIV (human immunodeficiency virus infection) ([place])   . Pneumonia 2007    Past Surgical History:  Procedure Laterality Date  . HERNIA REPAIR  1991   Abdominal   . unilateral orchiectomy    . VIDEO ASSISTED THORACOSCOPY (VATS)/WEDGE RESECTION Right 06/23/2018   Procedure: VIDEO ASSISTED THORACOSCOPY (VATS)/LUNG RESECTION, stapling and dissection of apical bleb, wedge resection of right upper lobe mass, lymph node dissection, intercostal nerve block;  Surgeon: Grace Isaac, MD;  Location: [facility];  Service: Thoracic;  Laterality: Right;  Marland Kitchen VIDEO BRONCHOSCOPY N/A 06/23/2018   Procedure: VIDEO BRONCHOSCOPY;  Surgeon: Grace Isaac, MD;  Location: [facility] OR;  Service: Thoracic;  Laterality: N/A;    Social History   Tobacco Use  Smoking Status Current Every Day Smoker  .  Packs/day: 1.00  . Years: 24.00  . Pack years: 24.00  . Types: Cigarettes  Smokeless Tobacco Never Used    Social History   Substance and Sexual Activity  Alcohol Use Yes  . Alcohol/week: 0.0 standard drinks   Comment: less than 40 oz a week     Allergies  Allergen Reactions  . Nsaids Other (See Comments)    DRUG INTERACTION WITH HIV MEDS  . Sulfa Antibiotics Itching    Current Facility-Administered Medications  Medication Dose Route Frequency Provider Last Rate Last Dose  . sodium chloride flush (NS) 0.9 % injection 3 mL  3 mL Intravenous Once Isla Pence, MD   Stopped at 06/30/18 1524   Current Outpatient Medications  Medication Sig Dispense Refill  . acetaminophen (TYLENOL 8 HOUR) 650 MG CR tablet Take 1 tablet (650 mg total) by mouth every 8 (eight) hours as needed. 30 tablet 0  . albuterol (PROVENTIL HFA;VENTOLIN HFA) 108 (90 Base) MCG/ACT  inhaler Inhale 1-2 puffs into the lungs every 6 (six) hours as needed for wheezing or shortness of breath.    Marland Kitchen BIKTARVY 50-200-25 MG TABS tablet TAKE 1 TABLET BY MOUTH DAILY (Patient taking differently: Take 1 tablet by mouth daily. ) 30 tablet 5  . cloNIDine (CATAPRES) 0.2 MG tablet Take 1 tablet (0.2 mg total) by mouth 2 (two) times daily. 60 tablet 1  . oxyCODONE (OXY IR/ROXICODONE) 5 MG immediate release tablet Take 1 tablet (5 mg total) by mouth every 4 (four) hours as needed for severe pain. 30 tablet 0    (Not in a hospital admission)   Family History  Problem Relation Age of Onset  . Diabetes Mother   . Hypertension Mother   . Cancer Mother        breast  . Diabetes Father   . Hypertension Father      Review of Systems:   Pertinent items are noted in HPI.     Cardiac Review of Systems: Y or  [    ]= no  Chest Pain Blue.Reese    ]  Resting SOB [ n  ] Exertional SOB  [ y ]  Orthopnea [ n ]   Pedal Edema [  n ]    Palpitations [n  ] Syncope  [ n ]   Presyncope [ n  ]  General Review of Systems: [Y] = yes [  ]=no Constitional: recent weight change [  ]; anorexia [  ]; fatigue [  ]; nausea [  ]; night sweats [  ]; fever [  ]; or chills [  ]                                                               Dental: Last Dentist visit:   Eye : blurred vision [  ]; diplopia [   ]; vision changes [  ];  Amaurosis fugax[  ]; Resp: cough [  ];  wheezing[  ];  hemoptysis[  ]; shortness of breath[  ]; paroxysmal nocturnal dyspnea[  ]; dyspnea on exertion[  ]; or orthopnea[  ];  GI:  gallstones[  ], vomiting[  ];  dysphagia[  ]; melena[  ];  hematochezia [  ]; heartburn[  ];   Hx of  Colonoscopy[  ]; GU: kidney  stones [  ]; hematuria[  ];   dysuria [  ];  nocturia[  ];  history of     obstruction [  ]; urinary frequency [  ]             Skin: rash, swelling[  ];, hair loss[  ];  peripheral edema[  ];  or itching[  ]; Musculosketetal: myalgias[  ];  joint swelling[  ];  joint erythema[  ];  joint  pain[  ];  back pain[  ];  Heme/Lymph: bruising[  ];  bleeding[  ];  anemia[  ];  Neuro: TIA[  ];  headaches[  ];  stroke[  ];  vertigo[  ];  seizures[  ];   paresthesias[  ];  difficulty walking[  ];  Psych:depression[  ]; anxiety[  ];  Endocrine: diabetes[  ];  thyroid dysfunction[  ];              Physical Exam: BP (!) 136/124   Pulse 88   Temp 98.4 F (36.9 C) (Oral)   Resp (!) 23   Ht 5\' 11"  (1.803 m)   Wt 71.2 kg   SpO2 92%   BMI 21.90 kg/m    General appearance: alert, cooperative and mild distress Head: Normocephalic, without obvious abnormality, atraumatic Neck: no adenopathy, no carotid bruit, no JVD, supple, symmetrical, trachea midline and thyroid not enlarged, symmetric, no tenderness/mass/nodules Lymph nodes: Cervical, supraclavicular, and axillary nodes normal. Resp: diminished breath sounds RLL Back: symmetric, no curvature. ROM normal. No CVA tenderness. Cardio: regular rate and rhythm, S1, S2 normal, no murmur, click, rub or gallop GI: soft, non-tender; bowel sounds normal; no masses,  no organomegaly Extremities: extremities normal, atraumatic, no cyanosis or edema and Homans sign is negative, no sign of DVT Neurologic: Grossly normal  Diagnostic Studies & Laboratory data:     Recent Radiology Findings:   Dg Chest 2 View  Result Date: 06/30/2018 CLINICAL DATA:  Acute onset of shortness of breath and right-sided chest pain. EXAM: CHEST - 2 VIEW COMPARISON:  Chest radiograph performed 06/29/2018 FINDINGS: There is been interval increase in the size of the patient's large right-sided pneumothorax, involving approximately 40% of the right hemithorax. A small right pleural effusion is noted. Worsening right basilar airspace opacity and persistent mild left basilar opacity likely reflects atelectasis, though would correlate clinically to exclude pneumonia. A right-sided chest port is noted ending about the proximal right atrium. The heart is normal in size; the  mediastinal contour is within normal limits. No acute osseous abnormalities are seen. Scattered soft tissue air is noted at the right lower chest wall, similar in appearance to the prior study. IMPRESSION: 1. Interval increase in size of large right-sided pneumothorax, involving approximately 40% of the right hemithorax. Small right pleural effusion noted. 2. Worsening right basilar airspace opacity and persistent mild left basilar opacity likely reflects atelectasis, though would correlate clinically to exclude pneumonia. 3. Scattered soft tissue air at the right lower chest wall, similar in appearance to the recent prior study. Critical Value/emergent results were called by telephone at the time of interpretation on 06/30/2018 at 2:48 pm to Dr. Aletta Edouard, who verbally acknowledged these results. Electronically Signed   By: Garald Balding M.D.   On: 06/30/2018 14:50   Dg Chest 2 View  Result Date: 06/29/2018 CLINICAL DATA:  Chest pain and shortness of breath EXAM: CHEST - 2 VIEW COMPARISON:  06/28/2018 FINDINGS: Postsurgical changes are again noted in the right apex. The previously seen right-sided  pneumothorax has increased slightly in the interval from the prior exam. There is now approximately 4 cm excursion from the chest wall superiorly. Bibasilar atelectatic changes are noted increased from the prior exam. The left lung shows no pneumothorax. Right chest wall port is again seen and stable. Cardiac shadow is stable. IMPRESSION: Increasing right-sided pneumothorax. Increased bibasilar atelectasis. Critical Value/emergent results were called by telephone at the time of interpretation on 06/29/2018 at 6:37 am to Dr. Veatrice Kells , who verbally acknowledged these results. Electronically Signed   By: Inez Catalina M.D.   On: 06/29/2018 06:37     I have independently reviewed the above radiologic studies and discussed with the patient   Recent Lab Findings: Lab Results  Component Value Date   WBC 9.3  06/30/2018   HGB 14.0 06/30/2018   HCT 41.2 06/30/2018   PLT 131 (L) 06/30/2018   GLUCOSE 113 (H) 06/30/2018   CHOL 186 05/07/2018   TRIG 234 (H) 05/07/2018   HDL 63 05/07/2018   LDLCALC 91 05/07/2018   ALT 288 (H) 06/29/2018   AST 263 (H) 06/29/2018   NA 139 06/30/2018   K 4.4 06/30/2018   CL 105 06/30/2018   CREATININE 1.03 06/30/2018   BUN 14 06/30/2018   CO2 27 06/30/2018   INR 1.0 06/21/2018      Assessment / Plan:   Increasing right pneumothorax and right pleural effusion, patient afebrile normal white count-right chest tube is been placed and small amount of pleural fluid return has been cultured Placed chest tube to suction 20 cm Transfer patient to [facility] Will not start any antibiotics at this point pending cultures.    Grace Isaac MD      [address] [place] [address] Office [phone]   [place] [phone]  06/30/2018 5:47 PM

## 2018-06-30 NOTE — Procedures (Signed)
      [address]       [place] [address]             [phone]    Chest Tube Insertion Procedure Note  Indications:  Clinically significant Pneumothorax  Pre-operative Diagnosis: Pneumothorax  Post-operative Diagnosis: Pneumothorax  Procedure Details  Informed consent was obtained for the procedure, including sedation.  Risks of lung perforation, hemorrhage, arrhythmia, and adverse drug reaction were discussed.   After sterile skin prep, using standard technique, a 10 French tube was placed in the right lateral 7 rib space.  Findings: 20 ml of serous fluid obtained  Estimated Blood Loss:  Minimal         Specimens:  Sent serosanguinous fluid              Complications:  None; patient tolerated the procedure well.         [facility]: [facility]          Condition: stable  Attending Attestation: I performed the procedure.

## 2018-06-30 NOTE — ED Notes (Signed)
Attempted to call report nurse not available at this time.  

## 2018-06-30 NOTE — Progress Notes (Signed)
      [address]       [place] [address]             [phone]     Patient presents to [facility] with increased right PTX from several days ago. Plan placement of right chest tube  explained to patient including risk of tube and increased of not placing. He is agreeable.

## 2018-07-01 ENCOUNTER — Other Ambulatory Visit: Payer: Self-pay

## 2018-07-01 ENCOUNTER — Ambulatory Visit: Payer: Self-pay | Admitting: Cardiothoracic Surgery

## 2018-07-01 ENCOUNTER — Encounter (HOSPITAL_COMMUNITY): Payer: Self-pay | Admitting: General Practice

## 2018-07-01 ENCOUNTER — Inpatient Hospital Stay (HOSPITAL_COMMUNITY): Payer: Medicaid Other

## 2018-07-01 DIAGNOSIS — J939 Pneumothorax, unspecified: Secondary | ICD-10-CM

## 2018-07-01 HISTORY — DX: Pneumothorax, unspecified: J93.9

## 2018-07-01 NOTE — Progress Notes (Addendum)
[address]       [place] [address]             [phone]           Subjective: Patient with less chest pain on the right  Objective: Vital signs in last 24 hours: Temp:  [98.4 F (36.9 C)-98.6 F (37 C)] 98.6 F (37 C) (03/12 0324) Pulse Rate:  [83-114] 98 (03/12 0324) Cardiac Rhythm: Normal sinus rhythm (03/11 2207) Resp:  [14-38] 18 (03/12 0324) BP: (106-155)/(66-124) 106/66 (03/12 0324) SpO2:  [89 %-98 %] 94 % (03/12 0324) Weight:  [68.6 kg-71.2 kg] 68.6 kg (03/11 2205)     Intake/Output from previous day: 03/11 0701 - 03/12 0700 In: -  Out: 1000 [Chest Tube:1000]   Physical Exam:  Cardiovascular: RRR Pulmonary: Clear to auscultation on the left and diminished right apex Extremities: No lower extremity edema. Chest Tube: to suction, no air leak  Lab Results: CBC: Recent Labs    06/29/18 0608 06/30/18 1445  WBC 5.0 9.3  HGB 13.4 14.0  HCT 38.2* 41.2  PLT 115* 131*   BMET:  Recent Labs    06/29/18 0608 06/30/18 1445  NA 134* 139  K 4.4 4.4  CL 102 105  CO2 23 27  GLUCOSE 82 113*  BUN 11 14  CREATININE 0.93 1.03  CALCIUM 8.8* 9.2    PT/INR: No results for input(s): LABPROT, INR in the last 72 hours. ABG:  INR: Will add last result for INR, ABG once components are confirmed Will add last 4 CBG results once components are confirmed  Assessment/Plan:  1. CV - SR in the 90's (tachy 110's at times). On Clonidine patch 0.2 mg bid 2.  Pulmonary - Pigtail chest tube with 1000 cc. Chest tube is to suction and there is no air leak. Await this am's CXR;hopefully, right pneumothorax is decreased. [place] to place pigtail chest tube to water seal soon. Check CXR in am.   Sharalyn Ink [facility] 07/01/2018,7:33 AM [phone]  Dg Chest Port 1 View  Result Date: 07/01/2018 CLINICAL DATA:  Follow-up pneumothorax. EXAM: PORTABLE CHEST 1 VIEW COMPARISON:  06/30/2018 at 1820 hours and older exams. FINDINGS: Right apical  pneumothorax is similar to the most recent prior exam allowing for differences in patient positioning. There are stable post surgical changes with pulmonary anastomosis staples extending from adjacent to the right hila to the right apex. There is bilateral lung base opacity, which appears mildly increased on the right from the prior study, and stable on the left, consistent with atelectasis likely with small effusions. Right inferior hemithorax chest tube is stable. Remainder of the lungs is clear. Right anterior chest wall Port-A-Cath is also stable. IMPRESSION: 1. Right apical pneumothorax is without significant change from the most recent prior study. 2. Mild increase in right lung base opacity with stable left lung base opacity, consistent with atelectasis likely with small effusions. Electronically Signed   By: Lajean Manes M.D.   On: 07/01/2018 14:05   Dg Chest Port 1 View  Result Date: 06/30/2018 CLINICAL DATA:  Right chest tube placement. EXAM: PORTABLE CHEST 1 VIEW COMPARISON:  Chest radiograph performed earlier today at 5:36 p.m. FINDINGS: The patient's right-sided pneumothorax has decreased mildly in size. The right basilar pigtail catheter is unchanged in appearance. Bibasilar airspace opacities again likely reflect atelectasis. No significant pleural effusion is seen. The cardiomediastinal silhouette is normal in size. A right-sided chest port is noted ending about the right  atrium. No acute osseous abnormalities are identified. IMPRESSION: 1. Right-sided pneumothorax has decreased mildly in size. Right basilar pigtail catheter is unchanged in appearance. 2. Bibasilar airspace opacities again likely reflect atelectasis. Electronically Signed   By: Garald Balding M.D.   On: 06/30/2018 18:51    Recent Results (from the past 240 hour(s))  Culture, fungus without smear     Status: None (Preliminary result)   Collection Time: 06/23/18 10:35 AM  Result Value Ref Range Status   Specimen Description  LYMPH NODE  Final   Special Requests NONE  Final   Culture   Final    NO FUNGUS ISOLATED AFTER 8 DAYS Performed at [facility], [address] [place], [place] [address]    Report Status PENDING  Incomplete  Aerobic/Anaerobic Culture (surgical/deep wound)     Status: None   Collection Time: 06/23/18 10:35 AM  Result Value Ref Range Status   Specimen Description LYMPH NODE  Final   Special Requests NONE  Final   Gram Stain   Final    RARE WBC PRESENT, PREDOMINANTLY PMN NO ORGANISMS SEEN    Culture   Final    No growth aerobically or anaerobically. Performed at [facility], [place] [address], [place], [place] [address]    Report Status 06/28/2018 FINAL  Final  Acid Fast Smear (AFB)     Status: None   Collection Time: 06/23/18 10:35 AM  Result Value Ref Range Status   AFB Specimen Processing Comment  Final    Comment: Tissue Grinding and Digestion/Decontamination   Acid Fast Smear Negative  Final    Comment: (NOTE) Performed At: [facility] [place], [place] [id number] Rush Farmer MD TS:[id number]    Source (AFB) LYMPH NODE  Final    Comment: Performed at [facility], [place] [address], [place], [place] [address]  leave chest tube to suction today  Culture of pleural fluid  done last night does not appear to be re cored in micro system I have seen and examined [name] and agree with the above assessment  and plan.  Grace Isaac MD [facility] [phone] Office [phone] 07/01/2018 6:15 PM

## 2018-07-02 ENCOUNTER — Inpatient Hospital Stay (HOSPITAL_COMMUNITY): Payer: Medicaid Other

## 2018-07-02 LAB — COMPREHENSIVE METABOLIC PANEL
ALT: 170 U/L — ABNORMAL HIGH (ref 0–44)
AST: 97 U/L — ABNORMAL HIGH (ref 15–41)
Albumin: 3.2 g/dL — ABNORMAL LOW (ref 3.5–5.0)
Alkaline Phosphatase: 70 U/L (ref 38–126)
Anion gap: 8 (ref 5–15)
BUN: 10 mg/dL (ref 6–20)
CO2: 27 mmol/L (ref 22–32)
Calcium: 8.9 mg/dL (ref 8.9–10.3)
Chloride: 100 mmol/L (ref 98–111)
Creatinine, Ser: 1.06 mg/dL (ref 0.61–1.24)
GFR calc Af Amer: >60 mL/min (ref >60)
GFR calc non Af Amer: >60 mL/min (ref >60)
Glucose, Bld: 107 mg/dL — ABNORMAL HIGH (ref 70–99)
Potassium: 4.2 mmol/L (ref 3.5–5.1)
Sodium: 135 mmol/L (ref 135–145)
Total Bilirubin: 1.6 mg/dL — ABNORMAL HIGH (ref 0.3–1.2)
Total Protein: 5.6 g/dL — ABNORMAL LOW (ref 6.5–8.1)

## 2018-07-02 MED ORDER — LACTULOSE 10 GM/15ML PO SOLN
20.0000 g | Freq: Once | ORAL | Status: AC
  Administered 2018-07-02: 20 g via ORAL
  Filled 2018-07-02: qty 30

## 2018-07-02 NOTE — Progress Notes (Signed)
      [address]       [place] [address]             [phone]           Subjective: Patient states has not moved bowels in a few days.  Objective: Vital signs in last 24 hours: Temp:  [98 F (36.7 C)-98.6 F (37 C)] 98 F (36.7 C) (03/13 0504) Pulse Rate:  [76-92] 84 (03/13 0504) Cardiac Rhythm: Normal sinus rhythm (03/13 0700) Resp:  [11-26] 11 (03/13 0504) BP: (105-116)/(73-95) 116/85 (03/13 0504) SpO2:  [96 %-99 %] 97 % (03/13 0504)     Intake/Output from previous day: 03/12 0701 - 03/13 0700 In: 38.7 [I.V.:38.7] Out: 70 [Chest Tube:70]   Physical Exam:  Cardiovascular: RRR Pulmonary: Clear to auscultation on the left and slightly diminished right apex Extremities: No lower extremity edema. Chest Tube: to suction, no air leak  Lab Results: CBC: Recent Labs    06/30/18 1445  WBC 9.3  HGB 14.0  HCT 41.2  PLT 131*   BMET:  Recent Labs    06/30/18 1445 07/02/18 0302  NA 139 135  K 4.4 4.2  CL 105 100  CO2 27 27  GLUCOSE 113* 107*  BUN 14 10  CREATININE 1.03 1.06  CALCIUM 9.2 8.9    PT/INR: No results for input(s): LABPROT, INR in the last 72 hours. ABG:  INR: Will add last result for INR, ABG once components are confirmed Will add last 4 CBG results once components are confirmed  Assessment/Plan:  1. CV - SR in the 70's.. On Clonidine patch 0.2 mg bid 2.  Pulmonary - On 4 liters via [place]. Pigtail chest tube with 70 cc. Chest tube is to suction and there is no air leak. CXR this am appears to show small right apical pneumothorax. [place] to place pigtail chest tube to water seal. Check CXR in am. 3. Laxative  Jeremiah Mckay M ZimmermanPA-C 07/02/2018,7:24 AM [phone]

## 2018-07-02 NOTE — Progress Notes (Signed)
Pt called out for PRN pain medicine to be given. Pain medicine was about to be administered when pt began demanding that the IV tubing be unhooked and the pain medication pushed directly into IV port. Pt has continuous fluids infusing. The pt was educated that to reduce risk for infection, IV push medications are given through the push port on continuous fluid IV tubing. Pt has received this medication throughout my shift and the medication has been flushed behind with saline. Pt refused to allow this RN to give his pain medication unless it was done the way he wanted. Pt continued to be educated, but demanded to talk to the charge nurse. Charge nurse talked to pt at bedside and pt became very agitated and demanded that we do what he wanted. Pt re-educated. Pt eventually allowed his pain medication to be given. Will continue current plan of care as pt allows.

## 2018-07-03 ENCOUNTER — Inpatient Hospital Stay (HOSPITAL_COMMUNITY): Payer: Medicaid Other

## 2018-07-03 DIAGNOSIS — J95811 Postprocedural pneumothorax: Secondary | ICD-10-CM

## 2018-07-03 NOTE — Progress Notes (Addendum)
      [address]       [place] [address]             [phone]           Subjective: Patient asking for pain medication this am.  Objective: Vital signs in last 24 hours: Temp:  [98 F (36.7 C)-98.7 F (37.1 C)] 98.4 F (36.9 C) (03/14 0600) Pulse Rate:  [82-87] 85 (03/13 2008) Cardiac Rhythm: Normal sinus rhythm (03/14 0700) Resp:  [15-20] 17 (03/14 0600) BP: (104-141)/(72-99) 125/90 (03/14 0600) SpO2:  [97 %-100 %] 97 % (03/14 0600)     Intake/Output from previous day: 03/13 0701 - 03/14 0700 In: 400 [P.O.:400] Out: 2385 [Urine:2375; Chest Tube:10]   Physical Exam:  Cardiovascular: RRR Pulmonary: Clear to auscultation on the left and slightly diminished right apex Extremities: No lower extremity edema. Chest Tube: to water seal, no air leak  Lab Results: CBC: Recent Labs    06/30/18 1445  WBC 9.3  HGB 14.0  HCT 41.2  PLT 131*   BMET:  Recent Labs    06/30/18 1445 07/02/18 0302  NA 139 135  K 4.4 4.2  CL 105 100  CO2 27 27  GLUCOSE 113* 107*  BUN 14 10  CREATININE 1.03 1.06  CALCIUM 9.2 8.9    PT/INR: No results for input(s): LABPROT, INR in the last 72 hours. ABG:  INR: Will add last result for INR, ABG once components are confirmed Will add last 4 CBG results once components are confirmed  Assessment/Plan:  1. CV - SR in the 90's (walking in room). On Clonidine patch 0.2 mg bid 2.  Pulmonary - On 4 liters via [facility]. Pigtail chest tube with 10 cc. Chest tube is to water seal and there is no air leak. CXR ordered but not taken yet.  [place] to remove chest tube soon. Check CXR in am.   Donielle M ZimmermanPA-C 07/03/2018,7:18 AM [phone] Patient seen and examined, agree with above CXR not done yet, no air leak on water seal  Remo Lipps C. Roxan Hockey, MD [facility] [phone]

## 2018-07-03 NOTE — Discharge Summary (Signed)
Physician Discharge Summary       [address]       [place] [address]             [phone]    Patient ID: Jeremiah [name] MRN: [id number] DOB/AGE: [birth year] 48 y.o.  Admit date: 06/30/2018 Discharge date: 07/09/2018  Admission Diagnoses: Increased left pneumothorax and right pleural effusion HIV Disease Dyslipidemia H/O anal cancer Pulmonary granuloma  Discharge Diagnoses:  Hydropneumothorax s/p bronchoscopy, right VATS, resection and stapling of large apical bleb, wedge RUL, LN dissection, and intercostal nerve block HIV Disease Dyslipidemia H/O anal cancer Pulmonary granuloma  Procedure (s):  1. 10 French chest tube placed on the right on 03/11 2. IR placement of 10 French chest tube on right 03/17  History of Presenting Illness: Patient presents to the [facility] emergency room.  Last week underwent right video-assisted thoracoscopy wedge resection of mass and right upper lobe that was suspicious for primary lung cancer but turned out to be inflammatory mass, in addition the patient had a large bulla in the apex of his lung which were resected.  Chest tube was pulled out late last week with no air leak and stable chest x-ray he was discharged home on Monday.  Return to the [facility] emergency room yesterday with a small apical pneumothorax, and was to be seen back in the office tomorrow.  He came to the [facility] emergency room this afternoon with chest discomfort, chest x-ray showed increasing right effusion and some increasing right pneumothorax.  On review of the film it was decided to proceed with placement of chest tube, and then transfer patient back to [facility] Course:  [name] placed a 10 French right chest tube on 03/11. Follow up chest x ray showed a right hydropneumothorax with a moderate pneumothorax component and a small residual pleural fluid component. Also, bilateral lower lobe atelectasis. Chest tube remained to  suction and there was no air leak. Chest tube was placed to water seal on 03/13. Follow up chest x rays showed stable right pneumothorax and right pleural fluid. Chest tube was removed on 03/17 as it was no longer draining fluid and patient still had right pneumothorax. IR was consulted and a 10 French chest tube was placed via CT guidance. Chest x rays showed a decrease right hydro pneumothorax. Chest tube was placed to water seal on 03/18. The chest x-ray remained stable and and he had no further air leak.  The pleural tube was removed on 48/19/20. Follow up PA/LAT CXR on 07/09/18 showed stable right apical air space and stable post-op changes.   His respiratory status was stable and he was maintaining adequate O2 saturations on RA.    Latest Vital Signs: Blood pressure (!) 121/93, pulse 66, temperature 98 F (36.7 C), temperature source Oral, resp. rate 13, height 5\' 11"  (1.803 m), weight 68.6 kg, SpO2 98 %.  Physical Exam: General appearance:alert, cooperative and mild distress Heart:regular rate and rhythm Lungs:Clear, diminished right base. No air leak. Chest tube removed yesterday, site is dry. No subQ air. CXR reviewed, No change in right apical air space.  Discharge Condition: Stable and discharged to home.  Recent laboratory studies:  Lab Results  Component Value Date   WBC 10.9 (H) 07/05/2018   HGB 11.4 (L) 07/05/2018   HCT 32.9 (L) 07/05/2018   MCV 104.1 (H) 07/05/2018   PLT 157 07/05/2018   Lab Results  Component Value Date   NA 134 (L) 07/07/2018  K 3.9 07/07/2018   CL 99 07/07/2018   CO2 29 07/07/2018   CREATININE 0.87 07/07/2018   GLUCOSE 98 07/07/2018     Diagnostic Studies: Dg Chest 2 View  Result Date: 06/30/2018 CLINICAL DATA:  Acute onset of shortness of breath and right-sided chest pain. EXAM: CHEST - 2 VIEW COMPARISON:  Chest radiograph performed 06/29/2018 FINDINGS: There is been interval increase in the size of the patient's large right-sided  pneumothorax, involving approximately 40% of the right hemithorax. A small right pleural effusion is noted. Worsening right basilar airspace opacity and persistent mild left basilar opacity likely reflects atelectasis, though would correlate clinically to exclude pneumonia. A right-sided chest port is noted ending about the proximal right atrium. The heart is normal in size; the mediastinal contour is within normal limits. No acute osseous abnormalities are seen. Scattered soft tissue air is noted at the right lower chest wall, similar in appearance to the prior study. IMPRESSION: 1. Interval increase in size of large right-sided pneumothorax, involving approximately 40% of the right hemithorax. Small right pleural effusion noted. 2. Worsening right basilar airspace opacity and persistent mild left basilar opacity likely reflects atelectasis, though would correlate clinically to exclude pneumonia. 3. Scattered soft tissue air at the right lower chest wall, similar in appearance to the recent prior study. Critical Value/emergent results were called by telephone at the time of interpretation on 06/30/2018 at 2:48 pm to Dr. Aletta Edouard, who verbally acknowledged these results. Electronically Signed   By: Garald Balding M.D.   On: 06/30/2018 14:50   Dg Chest 2 View  Result Date: 06/29/2018 CLINICAL DATA:  Chest pain and shortness of breath EXAM: CHEST - 2 VIEW COMPARISON:  06/28/2018 FINDINGS: Postsurgical changes are again noted in the right apex. The previously seen right-sided pneumothorax has increased slightly in the interval from the prior exam. There is now approximately 4 cm excursion from the chest wall superiorly. Bibasilar atelectatic changes are noted increased from the prior exam. The left lung shows no pneumothorax. Right chest wall port is again seen and stable. Cardiac shadow is stable. IMPRESSION: Increasing right-sided pneumothorax. Increased bibasilar atelectasis. Critical Value/emergent results  were called by telephone at the time of interpretation on 06/29/2018 at 6:37 am to Dr. Veatrice Kells , who verbally acknowledged these results. Electronically Signed   By: Inez Catalina M.D.   On: 06/29/2018 06:37   Dg Chest 2 View  Result Date: 06/28/2018 CLINICAL DATA:  Partial nephrectomy and pneumothorax EXAM: CHEST - 2 VIEW COMPARISON:  Yesterday FINDINGS: Small right apical pneumothorax, 2 rib interspaces in height-possibly decreased from yesterday (although different positioning). Mild atelectasis at the bases, increased. Chest wall emphysema. Normal heart size. IMPRESSION: 1. Stable to decreased small right apical pneumothorax. 2. Increased atelectasis. Electronically Signed   By: [name] M.D.   On: 06/28/2018 07:33   Dg Chest 2 View  Result Date: 06/21/2018 CLINICAL DATA:  Preoperative evaluation for upcoming VATS EXAM: CHEST - 2 VIEW COMPARISON:  06/08/2018 FINDINGS: Cardiac shadows within normal limits. Right-sided chest wall port is noted in satisfactory position. The lungs are well aerated bilaterally. Emphysematous changes are seen in the right apex similar to that noted on prior CT. The known right upper lobe mass lesion is not as well appreciated as on prior CT. IMPRESSION: No acute abnormality noted. Emphysematous changes are seen. The known right upper lobe mass is not well appreciated. Electronically Signed   By: Inez Catalina M.D.   On: 06/21/2018 13:55   Nm  Pet Image Initial (pi) Skull Base To Thigh  EXAM: CHEST - 2 VIEW  COMPARISON:  Yesterday  FINDINGS: Interval removal of chest tube. Small right apical pneumothorax. Stable loculated pleural fluid laterally and at the base on the right. Porta catheter with tip at the right atrium. Mild atelectatic type opacity on the left.  IMPRESSION: Unchanged small right apical pneumothorax and right pleural fluid.   Electronically Signed   By: [name] M.D.   On: 07/09/2018 06:56   Result Date:  06/08/2018 CLINICAL DATA:  Initial treatment strategy for pulmonary nodule. History of anal cancer. EXAM: NUCLEAR MEDICINE PET SKULL BASE TO THIGH TECHNIQUE: 7.5 mCi F-18 FDG was injected intravenously. Full-ring PET imaging was performed from the skull base to thigh after the radiotracer. CT data was obtained and used for attenuation correction and anatomic localization. Fasting blood glucose: 91 mg/dl COMPARISON:  05/22/2018 FINDINGS: Mediastinal blood pool activity: SUV max 1.9 NECK: Motion artifact diminishes exam detail at the level of the soft tissues of neck. Incidental CT findings: none CHEST: No hypermetabolic axillary or supraclavicular lymph nodes. No hypermetabolic mediastinal or hilar lymph nodes. Moderate changes of paraseptal emphysema with diffuse bronchial wall thickening. Within the medial right upper lobe there is a nodule adjacent to a bulla measuring 1.2 x 0.8 cm, image 41/8. This is new when compared with 02/25/2015. The SUV max associated with this nodule is equal to 4.04. Incidental CT findings: none ABDOMEN/PELVIS: No abnormal uptake within the liver, spleen, pancreas or adrenal glands. No hypermetabolic lymph nodes within the abdomen or pelvis. No hypermetabolic inguinal lymph nodes. Mild nonspecific uptake at the level of the anus has an SUV max of 3.87. Incidental CT findings: None SKELETON: There is decreased uptake within the L5 vertebra and sacrum likely reflecting changes due to external beam radiation. No suspicious hypermetabolic bone lesions identified. Incidental CT findings: none IMPRESSION: 1. Increased radiotracer uptake associated with right upper lobe pulmonary nodule is identified within SUV max of 4.04. Finding is worrisome for malignancy. Given the extensive smoking related changes within the lungs findings may reflect primary bronchogenic carcinoma versus metastatic disease from known anal neoplasm. 2. No additional foci of increased uptake suspicious for metastatic  disease. Mild nonspecific increased uptake is identified at the level of the anus. 3.  Emphysema (ICD10-J43.9). Electronically Signed   By: Kerby Moors M.D.   On: 06/08/2018 16:38   Dg Chest Port 1 View  Result Date: 07/07/2018 CLINICAL DATA:  Follow-up pneumothorax EXAM: PORTABLE CHEST 1 VIEW COMPARISON:  07/06/2018 FINDINGS: Cardiac shadow is stable. Right chest wall port is again seen. Right chest tube is again noted superiorly. Postsurgical changes on the right are noted. Minimal apical pneumothorax is again identified and stable. Loculated pleural fluid is noted inferolaterally. Minimal bibasilar atelectatic changes are seen. IMPRESSION: Stable minimal right apical pneumothorax. Somewhat loculated right pleural effusion. Mild bibasilar atelectasis. Electronically Signed   By: Inez Catalina M.D.   On: 07/07/2018 08:01   Dg Chest Port 1 View  Result Date: 07/06/2018 CLINICAL DATA:  Chest tube EXAM: PORTABLE CHEST 1 VIEW COMPARISON:  Two days ago FINDINGS: Right-sided chest tube with pigtail overlapping the apex where there is a small and decreased pneumothorax. Pleural fluid and lung opacity at the right base with some loculation. Normal heart size. Porta catheter with tip at the right atrium. IMPRESSION: 1. Right apical pneumothorax is small and decreased after new chest tube placement. 2. Loculated pleural fluid on the right. Electronically Signed   By: Angelica Chessman  Watts M.D.   On: 07/06/2018 08:59   Dg Chest Port 1 View  Result Date: 07/05/2018 CLINICAL DATA:  Follow-up pneumothorax EXAM: PORTABLE CHEST 1 VIEW COMPARISON:  07/02/2018 FINDINGS: Compared to the immediate prior film, the chest tube at the right base has been removed. Persistent pleural air and fluid at the right apex, similar to the previous study. Left chest remains clear except for left base atelectasis. IMPRESSION: Removal of the right base chest tube. Persistent pleural air and fluid on the right. Pleural fluid may be slightly  increased. Electronically Signed   By: Nelson Chimes M.D.   On: 07/05/2018 08:14   Dg Chest Port 1 View  Result Date: 07/04/2018 CLINICAL DATA:  Follow-up pneumothorax. EXAM: PORTABLE CHEST 1 VIEW COMPARISON:  07/03/2018 FINDINGS: RIGHT Port-A-Cath with tip overlying the RIGHT atrium and RIGHT thoracostomy tube again noted. Unchanged RIGHT apical pneumothorax, RIGHT pleural fluid and RIGHT LOWER lung opacity/atelectasis. Mild LEFT basilar atelectasis again noted. Little interval change from prior study. IMPRESSION: Unchanged appearance of the chest with RIGHT pneumothorax, pleural fluid and RIGHT LOWER lung opacity/atelectasis. Electronically Signed   By: Margarette Canada M.D.   On: 07/04/2018 09:46   Dg Chest Port 1 View  Result Date: 07/03/2018 CLINICAL DATA:  Followup right pneumothorax. EXAM: PORTABLE CHEST 1 VIEW COMPARISON:  07/02/2018 FINDINGS: Mild atelectasis persists at the left lung base. Right chest tube remains in place. Right apical pneumothorax appears similar. There is accumulating pleural fluid on the right. IMPRESSION: Accumulating pleural fluid on the right, with persistent pleural air at the apex. Persistent lower lung atelectasis on both sides. Electronically Signed   By: Nelson Chimes M.D.   On: 07/03/2018 11:02   Dg Chest Port 1 View  Result Date: 07/02/2018 CLINICAL DATA:  Pneumothorax. EXAM: PORTABLE CHEST 1 VIEW COMPARISON:  Radiograph of July 01, 2018. FINDINGS: Stable cardiomediastinal silhouette. Grossly stable right apical pneumothorax is noted. Right internal jugular Port-A-Cath is unchanged in position. Stable position of right pigtail drainage catheter. Stable right pleural effusion is noted with associated atelectasis of right lung base. Stable postsurgical changes are noted in the right upper lobe. Stable mild left basilar subsegmental atelectasis is noted. Bony thorax is unremarkable. IMPRESSION: Grossly stable right apical pneumothorax. Stable position of right-sided  pigtail pleural drainage catheter is noted. Stable moderate right pleural effusion is noted with associated atelectasis. Stable mild left basilar atelectasis. Electronically Signed   By: Marijo Conception, M.D.   On: 07/02/2018 08:47   Dg Chest Port 1 View  Result Date: 07/01/2018 CLINICAL DATA:  Follow-up pneumothorax. EXAM: PORTABLE CHEST 1 VIEW COMPARISON:  06/30/2018 at 1820 hours and older exams. FINDINGS: Right apical pneumothorax is similar to the most recent prior exam allowing for differences in patient positioning. There are stable post surgical changes with pulmonary anastomosis staples extending from adjacent to the right hila to the right apex. There is bilateral lung base opacity, which appears mildly increased on the right from the prior study, and stable on the left, consistent with atelectasis likely with small effusions. Right inferior hemithorax chest tube is stable. Remainder of the lungs is clear. Right anterior chest wall Port-A-Cath is also stable. IMPRESSION: 1. Right apical pneumothorax is without significant change from the most recent prior study. 2. Mild increase in right lung base opacity with stable left lung base opacity, consistent with atelectasis likely with small effusions. Electronically Signed   By: Lajean Manes M.D.   On: 07/01/2018 14:05   Dg Chest [facility]  Result Date: 06/30/2018 CLINICAL DATA:  Right chest tube placement. EXAM: PORTABLE CHEST 1 VIEW COMPARISON:  Chest radiograph performed earlier today at 5:36 p.m. FINDINGS: The patient's right-sided pneumothorax has decreased mildly in size. The right basilar pigtail catheter is unchanged in appearance. Bibasilar airspace opacities again likely reflect atelectasis. No significant pleural effusion is seen. The cardiomediastinal silhouette is normal in size. A right-sided chest port is noted ending about the right atrium. No acute osseous abnormalities are identified. IMPRESSION: 1. Right-sided pneumothorax has  decreased mildly in size. Right basilar pigtail catheter is unchanged in appearance. 2. Bibasilar airspace opacities again likely reflect atelectasis. Electronically Signed   By: Garald Balding M.D.   On: 06/30/2018 18:51   Dg Chest Portable 1 View  Result Date: 06/30/2018 CLINICAL DATA:  Chest tube placement EXAM: PORTABLE CHEST 1 VIEW COMPARISON:  06/30/2018 at 1433 hours FINDINGS: Right hydropneumothorax with a moderate pneumothorax component and a small residual pleural fluid component. Indwelling pigtail chest tube. Bilateral lower lobe atelectasis. Right chest port terminates in the upper right atrium. The heart is normal in size. IMPRESSION: Moderate right hydropneumothorax with indwelling pigtail chest tube, as above. Electronically Signed   By: Julian Hy M.D.   On: 06/30/2018 18:10   Dg Chest Port 1 View  Result Date: 06/27/2018 CLINICAL DATA:  Post chest tube removal status post partial lobectomy. EXAM: PORTABLE CHEST 1 VIEW COMPARISON:  06/27/2018 at 0637 hours FINDINGS: Chain sutures project over the right lung apex. Small stable right apical pneumothorax persists measuring up to 3.2 cm, previously 2.9 cm in estimation. Right-sided chest tube has been removed. No midline shift. Heart and mediastinal contours are stable. Port catheter tip terminates in the right atrium. No acute osseous abnormality. Soft tissue emphysema along the right lateral chest wall likely associated from chest tube insertion. IMPRESSION: Status post right-sided chest tube removal with stable right apical pneumothorax measuring up to 3.2 cm. Electronically Signed   By: Ashley Royalty M.D.   On: 06/27/2018 18:47   Dg Chest Port 1 View  Result Date: 06/27/2018 CLINICAL DATA:  Follow-up right pneumothorax EXAM: PORTABLE CHEST 1 VIEW COMPARISON:  06/26/2018 FINDINGS: Postsurgical changes are again noted on the right. Right chest wall port is again seen. Chest tube is noted in place with small apical pneumothorax  relatively stable from the prior exam. Left lung is well aerated with minimal atelectasis. No new focal abnormality is noted. IMPRESSION: Stable changes when compared with the prior exam. Electronically Signed   By: Inez Catalina M.D.   On: 06/27/2018 09:01   Dg Chest Port 1 View  Result Date: 06/26/2018 CLINICAL DATA:  Pneumothorax. EXAM: PORTABLE CHEST 1 VIEW COMPARISON:  06/25/2018 FINDINGS: Chest tube near the right lung apex has been removed. There continues to be a chest tube along the lateral aspect of the right chest. Right apical pneumothorax is stable. Again noted is right jugular Port-A-Cath with the tip extending into the upper right atrium. Slightly improved aeration at the left lung base with residual patchy densities. Heart size is stable. Trachea is midline. IMPRESSION: Stable right apical pneumothorax. One chest tube is still remaining. Slightly improving aeration in the left lung base. Electronically Signed   By: Markus Daft M.D.   On: 06/26/2018 09:24   Dg Chest Port 1 View  Result Date: 06/25/2018 CLINICAL DATA:  Chest tube, pneumothorax EXAM: PORTABLE CHEST 1 VIEW COMPARISON:  06/24/2018 FINDINGS: Right Port-A-Cath, chest tubes, central line remain in place, unchanged. Heart is normal size. Bibasilar  atelectasis or infiltrates, left greater than right, slightly increased on the left since prior study. No effusions. Small right apical pneumothorax. IMPRESSION: Postoperative changes on the right with right chest tubes in place. Small right apical pneumothorax. Bibasilar opacities, left greater than right, worsening on the left since prior study. This could reflect atelectasis or pneumonia. Electronically Signed   By: Rolm Baptise M.D.   On: 06/25/2018 07:55   Dg Chest Port 1 View  Result Date: 06/24/2018 CLINICAL DATA:  Pneumothorax.  Chest tube EXAM: PORTABLE CHEST 1 VIEW COMPARISON:  Yesterday FINDINGS: Two right-sided chest tubes are in stable position. Central lines with tips in stable  position. The porta catheter tip is at the inferior right atrium. Postoperative volume loss the right apex. Mild interstitial crowding/atelectasis. No effusion or pneumothorax. Normal heart size. IMPRESSION: 1. Lower lung volumes. 2. No visible pneumothorax. Electronically Signed   By: [name] M.D.   On: 06/24/2018 09:15   Dg Chest Port 1 View  Result Date: 06/23/2018 CLINICAL DATA:  Status post VATS EXAM: PORTABLE CHEST 1 VIEW COMPARISON:  06/21/2018 FINDINGS: Cardiac shadow is stable. Right jugular central line and right chest wall port are noted in satisfactory position. Two chest tubes are noted on the right. No evidence of pneumothorax is seen. Changes of recent VATS are noted in the right apex. No other focal abnormality is seen. IMPRESSION: Tubes and lines as described above. No evidence of pneumothorax. Electronically Signed   By: Inez Catalina M.D.   On: 06/23/2018 13:55   Ct Image Guided Fluid Drain By Catheter  Result Date: 07/05/2018 INDICATION: 48 year old male with a persistent right apical hydropneumothorax following wedge resection of the right upper lobe. He presents for placement of an image guided chest tube. EXAM: Chest tube placement under CT guidance MEDICATIONS: None ANESTHESIA/SEDATION: Fentanyl 100 mcg IV; Versed 2 mg IV Moderate Sedation Time:  20 minutes The patient was continuously monitored during the procedure by the interventional radiology nurse under my direct supervision. COMPLICATIONS: None immediate. PROCEDURE: Informed written consent was obtained from the patient after a thorough discussion of the procedural risks, benefits and alternatives. All questions were addressed. A timeout was performed prior to the initiation of the procedure. Axial CT imaging was performed. A suitable skin entry site was selected between the anterior aspects of the first and second ribs. The overlying skin was sterilely prepped and draped in the standard fashion using chlorhexidine skin  prep. Local anesthesia was attained by infiltration with 1% lidocaine. Using trocar technique, a 10 French cook all-purpose drainage catheter was advanced between the ribs and into the pleural space. The catheter was advanced in the lung apex and formed. The catheter was connected to low wall suction via an atrium at -20 cm of water. The catheter was then secured to the skin with 0 Prolene suture and an adhesive fixation device. Post drain placement imaging demonstrates a well-positioned drainage catheter in the right apex. IMPRESSION: Successful placement of a right apical 10 French pigtail drainage catheter. The catheter should be maintained to low wall suction via an atrium until the hydropneumothorax and any underlying air leak have resolved. Electronically Signed   By: Jacqulynn Cadet M.D.   On: 07/05/2018 17:32         Discharge Medications: Allergies as of 07/09/2018      Reactions   Nsaids Other (See Comments)   DRUG INTERACTION WITH HIV MEDS   Sulfa Antibiotics Itching      Medication List  STOP taking these medications   acetaminophen 650 MG CR tablet Commonly known as:  Tylenol 8 Hour   oxyCODONE 5 MG immediate release tablet Commonly known as:  Oxy IR/ROXICODONE     TAKE these medications   albuterol 108 (90 Base) MCG/ACT inhaler Commonly known as:  PROVENTIL HFA;VENTOLIN HFA Inhale 1-2 puffs into the lungs every 6 (six) hours as needed for wheezing or shortness of breath.   Biktarvy 50-200-25 MG Tabs tablet Generic drug:  bictegravir-emtricitabine-tenofovir AF TAKE 1 TABLET BY MOUTH DAILY   cloNIDine 0.2 MG tablet Commonly known as:  CATAPRES Take 1 tablet (0.2 mg total) by mouth 2 (two) times daily.   oxyCODONE-acetaminophen 5-325 MG tablet Commonly known as:  Percocet Take 1 tablet by mouth every 6 (six) hours as needed for up to 5 days for severe pain.       Follow Up Appointments: Follow-up Information    Grace Isaac, MD. Go on 07/15/2018.    Specialty:  Cardiothoracic Surgery Why:  PA/LAT CXR to be taken (at [place] which is in the same building as Dr. Emelda Brothers office) on 03/26 at 9:30 am;Appointment time is at 10:00 am Contact information: [place] [address] [phone]           Signed: Joline Maxcy 07/09/2018, 12:45 PM

## 2018-07-03 NOTE — Discharge Instructions (Signed)
Pneumothorax °A pneumothorax is commonly called a collapsed lung. It is a condition in which air leaks from a lung and builds up between the thin layer of tissue that covers the lungs (visceral pleura) and the interior wall of the chest cavity (parietal pleura). The air gets trapped outside the lung, between the lung and the chest wall (pleural space). The air takes up space and prevents the lung from fully expanding. °This condition sometimes occurs suddenly with no apparent cause. The buildup of air may be small or large. A small pneumothorax may go away on its own. A large pneumothorax will require treatment and hospitalization. °What are the causes? °This condition may be caused by: °· Trauma and injury to the chest wall. °· Surgery and other medical procedures. °· A complication of an underlying lung problem, especially chronic obstructive pulmonary disease (COPD) or emphysema. °Sometimes the cause of this condition is not known. °What increases the risk? °You are more likely to develop this condition if: °· You have an underlying lung problem. °· You smoke. °· You are 20-40 years old, male, tall, and underweight. °· You have a personal or family history of pneumothorax. °· You have an eating disorder (anorexia nervosa). °This condition can also happen quickly, even in people with no history of lung problems. °What are the signs or symptoms? °Sometimes a pneumothorax will have no symptoms. When symptoms are present, they can include: °· Chest pain. °· Shortness of breath. °· Increased rate of breathing. °· Bluish color to your lips or skin (cyanosis). °How is this diagnosed? °This condition may be diagnosed by: °· A medical history and physical exam. °· A chest X-ray, chest CT scan, or ultrasound. °How is this treated? °Treatment depends on how severe your condition is. The goal of treatment is to remove the extra air and allow your lung to expand back to its normal size. °· For a small pneumothorax: °? No  treatment may be needed. °? Extra oxygen is sometimes used to make it go away more quickly. °· For a large pneumothorax or a pneumothorax that is causing symptoms, a procedure is done to drain the air from your lungs. To do this, a health care provider may use: °? A needle with a syringe. This is used to suck air from a pleural space where no additional leakage is taking place. °? A chest tube. This is used to suck air where there is ongoing leakage into the pleural space. The chest tube may need to remain in place for several days until the air leak has healed. °· In more severe cases, surgery may be needed to repair the damage that is causing the leak. °· If you have multiple pneumothorax episodes or have an air leak that will not heal, a procedure called a pleurodesis may be done. A medicine is placed in the pleural space to irritate the tissues around the lung so that the lung will stick to the chest wall, seal any leaks, and stop any buildup of air in that space. °If you have an underlying lung problem, severe symptoms, or a large pneumothorax you will usually need to stay in the hospital. °Follow these instructions at home: °Lifestyle °· Do not use any products that contain nicotine or tobacco, such as cigarettes and e-cigarettes. These are major risk factors in pneumothorax. If you need help quitting, ask your health care provider. °· Do not lift anything that is heavier than 10 lb (4.5 kg), or the limit that your health care   cigarettes and e-cigarettes. These are major risk factors in pneumothorax. If you need help quitting, ask your health care provider.   Do not lift anything that is heavier than 10 lb (4.5 kg), or the limit that your health care provider tells you, until he or she says that it is safe.   Avoid activities that take a lot of effort (strenuous) for as long as told by your health care provider.   Return to your normal activities as told by your health care provider. Ask your health care provider what activities are safe for you.   Do not fly in an airplane or scuba dive until your health care provider says it is okay.  General instructions   Take over-the-counter and prescription medicines  only as told by your health care provider.   If a cough or pain makes it difficult for you to sleep at night, try sleeping in a semi-upright position in a recliner or by using 2 or 3 pillows.   If you had a chest tube and it was removed, ask your health care provider when you can remove the bandage (dressing). While the dressing is in place, do not allow it to get wet.   Keep all follow-up visits as told by your health care provider. This is important.  Contact a health care provider if:   You cough up thick mucus (sputum) that is yellow or green in color.   You were treated with a chest tube, and you have redness, increasing pain, or discharge at the site where it was placed.  Get help right away if:   You have increasing chest pain or shortness of breath.   You have a cough that will not go away.   You begin coughing up blood.   You have pain that is getting worse or is not controlled with medicines.   The site where your chest tube was located opens up.   You feel air coming out of the site where the chest tube was placed.   You have a fever or persistent symptoms for more than 2-3 days.   You have a fever and your symptoms suddenly get worse.  These symptoms may represent a serious problem that is an emergency. Do not wait to see if the symptoms will go away. Get medical help right away. Call your local emergency services (911 in the [place] Do not drive yourself to the hospital.  Summary   A pneumothorax, commonly called a collapsed lung, is a condition in which air leaks from a lung and gets trapped between the lung and the chest wall (pleural space).   The buildup of air may be small or large. A small pneumothorax may go away on its own. A large pneumothorax will require treatment and hospitalization.   Treatment for this condition depends on how severe the pneumothorax is. The goal of treatment is to remove the extra air and allow the lung to expand back to its normal size.  This information  is not intended to replace advice given to you by your health care provider. Make sure you discuss any questions you have with your health care provider.  Document Released: 04/07/2005 Document Revised: 03/16/2017 Document Reviewed: 03/16/2017  [employer] Interactive Patient Education  2019 [employer]

## 2018-07-04 ENCOUNTER — Inpatient Hospital Stay (HOSPITAL_COMMUNITY): Payer: Medicaid Other

## 2018-07-04 MED ORDER — LACTULOSE 10 GM/15ML PO SOLN
20.0000 g | Freq: Every day | ORAL | Status: DC
  Administered 2018-07-04 – 2018-07-09 (×6): 20 g via ORAL
  Filled 2018-07-04 (×6): qty 30

## 2018-07-04 NOTE — Progress Notes (Addendum)
      [address]       [place] [address]             [id number]           Subjective: Patient with constipation. He states Lactulose and helped him so he wants daily  Objective: Vital signs in last 24 hours: Temp:  [98.5 F (36.9 C)-98.6 F (37 C)] 98.5 F (36.9 C) (03/15 0446) Pulse Rate:  [73-90] 73 (03/15 0446) Cardiac Rhythm: Normal sinus rhythm (03/14 1905) Resp:  [20-22] 22 (03/15 0446) BP: (115-123)/(76-87) 123/82 (03/15 0446) SpO2:  [94 %-99 %] 96 % (03/15 0446)     Intake/Output from previous day: 03/14 0701 - 03/15 0700 In: 1140 [P.O.:1060; I.V.:80] Out: 1375 [Urine:1375]   Physical Exam:  Cardiovascular: RRR Pulmonary: Clear to auscultation on the left and diminished right apex Extremities: No lower extremity edema. Chest Tube: to water seal, no air leak  Lab Results: CBC: No results for input(s): WBC, HGB, HCT, PLT in the last 72 hours. BMET:  Recent Labs    07/02/18 0302  NA 135  K 4.2  CL 100  CO2 27  GLUCOSE 107*  BUN 10  CREATININE 1.06  CALCIUM 8.9    PT/INR: No results for input(s): LABPROT, INR in the last 72 hours. ABG:  INR: Will add last result for INR, ABG once components are confirmed Will add last 4 CBG results once components are confirmed  Assessment/Plan:  1. CV - SR in the 90's (walking in room). On Clonidine patch 0.2 mg bid 2.  Pulmonary - On 4 liters via [place]. Pigtail chest tube with 10 cc. Chest tube is to water seal and there is no air leak.CXR this am appears similar to yesterday's. Will leave chest tube to water seal and await evalution by [name] in the am. 3. Lactulose daily   Donielle M ZimmermanPA-C 07/04/2018,7:12 AM [phone] Patient seen and examined, agree with above CXR unchanged appearance of apical space with fluid present The CT that is in place is not draining air or fluid- will dc   Remo Lipps C. Roxan Hockey, MD [facility] [phone]

## 2018-07-05 ENCOUNTER — Inpatient Hospital Stay (HOSPITAL_COMMUNITY): Payer: Medicaid Other

## 2018-07-05 LAB — CBC
HCT: 32.9 % — ABNORMAL LOW (ref 39.0–52.0)
Hemoglobin: 11.4 g/dL — ABNORMAL LOW (ref 13.0–17.0)
MCH: 36.1 pg — ABNORMAL HIGH (ref 26.0–34.0)
MCHC: 34.7 g/dL (ref 30.0–36.0)
MCV: 104.1 fL — AB (ref 80.0–100.0)
NRBC: 0 % (ref 0.0–0.2)
Platelets: 157 10*3/uL (ref 150–400)
RBC: 3.16 MIL/uL — ABNORMAL LOW (ref 4.22–5.81)
RDW: 11.3 % — ABNORMAL LOW (ref 11.5–15.5)
WBC: 10.9 10*3/uL — ABNORMAL HIGH (ref 4.0–10.5)

## 2018-07-05 LAB — PROTIME-INR
INR: 1 (ref 0.8–1.2)
Prothrombin Time: 12.7 seconds (ref 11.4–15.2)

## 2018-07-05 MED ORDER — MIDAZOLAM HCL 2 MG/2ML IJ SOLN
INTRAMUSCULAR | Status: AC | PRN
  Administered 2018-07-05: 1 mg via INTRAVENOUS
  Administered 2018-07-05 (×2): 0.5 mg via INTRAVENOUS

## 2018-07-05 MED ORDER — MIDAZOLAM HCL 2 MG/2ML IJ SOLN
INTRAMUSCULAR | Status: AC
  Filled 2018-07-05: qty 4

## 2018-07-05 MED ORDER — SODIUM CHLORIDE 0.9% FLUSH
5.0000 mL | Freq: Three times a day (TID) | INTRAVENOUS | Status: DC
  Administered 2018-07-06 – 2018-07-08 (×4): 5 mL

## 2018-07-05 MED ORDER — ENOXAPARIN SODIUM 40 MG/0.4ML ~~LOC~~ SOLN
40.0000 mg | Freq: Every day | SUBCUTANEOUS | Status: DC
  Administered 2018-07-06 – 2018-07-08 (×3): 40 mg via SUBCUTANEOUS
  Filled 2018-07-05 (×3): qty 0.4

## 2018-07-05 MED ORDER — LIDOCAINE HCL 1 % IJ SOLN
INTRAMUSCULAR | Status: AC
  Filled 2018-07-05: qty 20

## 2018-07-05 MED ORDER — FENTANYL CITRATE (PF) 100 MCG/2ML IJ SOLN
INTRAMUSCULAR | Status: AC | PRN
  Administered 2018-07-05: 25 ug via INTRAVENOUS
  Administered 2018-07-05: 50 ug via INTRAVENOUS
  Administered 2018-07-05: 25 ug via INTRAVENOUS

## 2018-07-05 MED ORDER — FENTANYL CITRATE (PF) 100 MCG/2ML IJ SOLN
INTRAMUSCULAR | Status: AC
  Filled 2018-07-05: qty 4

## 2018-07-05 NOTE — Consult Note (Signed)
Chief Complaint: Patient was seen in consultation today for right chest tube placement Chief Complaint  Patient presents with   Chest Pain   at the request of Dr Servando Snare  Supervising Physician: Aletta Edouard  Patient Status: [facility] - In-pt  History of Present Illness: Jeremiah [name] is a 48 y.o. male   Known HIV RUL wedge resection 3/5 Post PTX Chest tube placement 06/30/18   IMPRESSION: Unchanged appearance of the chest with RIGHT pneumothorax, pleural fluid and RIGHT LOWER lung opacity/atelectasis.  CT was dc'd yesterday  Request now for IR to place new Rt side chest tube drain Imaging reviewed with Dr Stephan Minister procedure   Past Medical History:  Diagnosis Date   Anal warts    COPD (chronic obstructive pulmonary disease) ([place])    Hemorrhoids 02/25/2015   History of tuberculin skin testing within last year 2014   HIV (human immunodeficiency virus infection) ([place])    Pneumonia 2007   Pneumothorax 07/01/2018   LEFT    Past Surgical History:  Procedure Laterality Date   CHEST TUBE INSERTION ([place] HX) Left 06/30/2018   HERNIA REPAIR  1991   Abdominal    unilateral orchiectomy     VIDEO ASSISTED THORACOSCOPY (VATS)/WEDGE RESECTION Right 06/23/2018   Procedure: VIDEO ASSISTED THORACOSCOPY (VATS)/LUNG RESECTION, stapling and dissection of apical bleb, wedge resection of right upper lobe mass, lymph node dissection, intercostal nerve block;  Surgeon: Grace Isaac, MD;  Location: [place];  Service: Thoracic;  Laterality: Right;   VIDEO BRONCHOSCOPY N/A 06/23/2018   Procedure: VIDEO BRONCHOSCOPY;  Surgeon: Grace Isaac, MD;  Location: [facility] OR;  Service: Thoracic;  Laterality: N/A;    Allergies: Nsaids and Sulfa antibiotics  Medications: Prior to Admission medications   Medication Sig Start Date End Date Taking? Authorizing Provider  acetaminophen (TYLENOL 8 HOUR) 650 MG CR tablet Take 1 tablet (650 mg total) by mouth every 8 (eight)  hours as needed. 06/02/18  Yes Varney Biles, MD  albuterol (PROVENTIL HFA;VENTOLIN HFA) 108 (90 Base) MCG/ACT inhaler Inhale 1-2 puffs into the lungs every 6 (six) hours as needed for wheezing or shortness of breath.   Yes [provider]  BIKTARVY 50-200-25 MG TABS tablet TAKE 1 TABLET BY MOUTH DAILY Patient taking differently: Take 1 tablet by mouth daily.  06/11/18  Yes Michel Bickers, MD  cloNIDine (CATAPRES) 0.2 MG tablet Take 1 tablet (0.2 mg total) by mouth 2 (two) times daily. 06/28/18  Yes Lars Pinks M, PA-C  oxyCODONE (OXY IR/ROXICODONE) 5 MG immediate release tablet Take 1 tablet (5 mg total) by mouth every 4 (four) hours as needed for severe pain. 06/28/18  Yes Nani Skillern, PA-C     Family History  Problem Relation Age of Onset   Diabetes Mother    Hypertension Mother    Cancer Mother        breast   Diabetes Father    Hypertension Father     Social History   Socioeconomic History   Marital status: Single    Spouse name: Not on file   Number of children: Not on file   Years of education: Not on file   Highest education level: Not on file  Occupational History   Not on file  Social Needs   Financial resource strain: Not on file   Food insecurity:    Worry: Not on file    Inability: Not on file   Transportation needs:    Medical: Not on file    Non-medical:  Not on file  Tobacco Use   Smoking status: Current Every Day Smoker    Packs/day: 1.00    Years: 24.00    Pack years: 24.00    Types: Cigarettes   Smokeless tobacco: Never Used  Substance and Sexual Activity   Alcohol use: Yes    Alcohol/week: 0.0 standard drinks    Comment: less than 40 oz a week   Drug use: No    Comment: Past history of crack cocaine  use    Sexual activity: Yes    Partners: Male    Birth control/protection: Condom    Comment: declined condoms  Lifestyle   Physical activity:    Days per week: Not on file    Minutes per session: Not on  file   Stress: Not on file  Relationships   Social connections:    Talks on phone: Not on file    Gets together: Not on file    Attends religious service: Not on file    Active member of club or organization: Not on file    Attends meetings of clubs or organizations: Not on file    Relationship status: Not on file  Other Topics Concern   Not on file  Social History Narrative   Not on file    Review of Systems: A 12 point ROS discussed and pertinent positives are indicated in the HPI above.  All other systems are negative.  Review of Systems  Constitutional: Negative for activity change, fatigue and fever.  Respiratory: Positive for cough and chest tightness. Negative for shortness of breath.   Psychiatric/Behavioral: Negative for confusion.    Vital Signs: BP 121/85 (BP Location: Right Arm)    Pulse 73    Temp 98.5 F (36.9 C) (Oral)    Resp (!) 22    Ht 5\' 11"  (1.803 m)    Wt 151 lb 3.8 oz (68.6 kg)    SpO2 91%    BMI 21.09 kg/m   Physical Exam Vitals signs reviewed.  Cardiovascular:     Rate and Rhythm: Regular rhythm.     Heart sounds: Normal heart sounds.  Pulmonary:     Effort: Pulmonary effort is normal.     Breath sounds: Examination of the right-upper field reveals decreased breath sounds. Examination of the right-middle field reveals decreased breath sounds. Decreased breath sounds present.  Abdominal:     Palpations: Abdomen is soft.  Musculoskeletal: Normal range of motion.  Skin:    General: Skin is warm and dry.  Neurological:     Mental Status: He is alert and oriented to person, place, and time.  Psychiatric:        Behavior: Behavior normal.     Imaging: Dg Chest 2 View  Result Date: 06/30/2018 CLINICAL DATA:  Acute onset of shortness of breath and right-sided chest pain. EXAM: CHEST - 2 VIEW COMPARISON:  Chest radiograph performed 06/29/2018 FINDINGS: There is been interval increase in the size of the patient's large right-sided pneumothorax,  involving approximately 40% of the right hemithorax. A small right pleural effusion is noted. Worsening right basilar airspace opacity and persistent mild left basilar opacity likely reflects atelectasis, though would correlate clinically to exclude pneumonia. A right-sided chest port is noted ending about the proximal right atrium. The heart is normal in size; the mediastinal contour is within normal limits. No acute osseous abnormalities are seen. Scattered soft tissue air is noted at the right lower chest wall, similar in appearance to the prior study. IMPRESSION:  1. Interval increase in size of large right-sided pneumothorax, involving approximately 40% of the right hemithorax. Small right pleural effusion noted. 2. Worsening right basilar airspace opacity and persistent mild left basilar opacity likely reflects atelectasis, though would correlate clinically to exclude pneumonia. 3. Scattered soft tissue air at the right lower chest wall, similar in appearance to the recent prior study. Critical Value/emergent results were called by telephone at the time of interpretation on 06/30/2018 at 2:48 pm to Dr. Aletta Edouard, who verbally acknowledged these results. Electronically Signed   By: Garald Balding M.D.   On: 06/30/2018 14:50   Dg Chest 2 View  Result Date: 06/29/2018 CLINICAL DATA:  Chest pain and shortness of breath EXAM: CHEST - 2 VIEW COMPARISON:  06/28/2018 FINDINGS: Postsurgical changes are again noted in the right apex. The previously seen right-sided pneumothorax has increased slightly in the interval from the prior exam. There is now approximately 4 cm excursion from the chest wall superiorly. Bibasilar atelectatic changes are noted increased from the prior exam. The left lung shows no pneumothorax. Right chest wall port is again seen and stable. Cardiac shadow is stable. IMPRESSION: Increasing right-sided pneumothorax. Increased bibasilar atelectasis. Critical Value/emergent results were called  by telephone at the time of interpretation on 06/29/2018 at 6:37 am to Dr. Veatrice Kells , who verbally acknowledged these results. Electronically Signed   By: Inez Catalina M.D.   On: 06/29/2018 06:37   Dg Chest 2 View  Result Date: 06/28/2018 CLINICAL DATA:  Partial nephrectomy and pneumothorax EXAM: CHEST - 2 VIEW COMPARISON:  Yesterday FINDINGS: Small right apical pneumothorax, 2 rib interspaces in height-possibly decreased from yesterday (although different positioning). Mild atelectasis at the bases, increased. Chest wall emphysema. Normal heart size. IMPRESSION: 1. Stable to decreased small right apical pneumothorax. 2. Increased atelectasis. Electronically Signed   By: [name] M.D.   On: 06/28/2018 07:33   Dg Chest 2 View  Result Date: 06/21/2018 CLINICAL DATA:  Preoperative evaluation for upcoming VATS EXAM: CHEST - 2 VIEW COMPARISON:  06/08/2018 FINDINGS: Cardiac shadows within normal limits. Right-sided chest wall port is noted in satisfactory position. The lungs are well aerated bilaterally. Emphysematous changes are seen in the right apex similar to that noted on prior CT. The known right upper lobe mass lesion is not as well appreciated as on prior CT. IMPRESSION: No acute abnormality noted. Emphysematous changes are seen. The known right upper lobe mass is not well appreciated. Electronically Signed   By: Inez Catalina M.D.   On: 06/21/2018 13:55   Nm Pet Image Initial (pi) Skull Base To Thigh  Result Date: 06/08/2018 CLINICAL DATA:  Initial treatment strategy for pulmonary nodule. History of anal cancer. EXAM: NUCLEAR MEDICINE PET SKULL BASE TO THIGH TECHNIQUE: 7.5 mCi F-18 FDG was injected intravenously. Full-ring PET imaging was performed from the skull base to thigh after the radiotracer. CT data was obtained and used for attenuation correction and anatomic localization. Fasting blood glucose: 91 mg/dl COMPARISON:  05/22/2018 FINDINGS: Mediastinal blood pool activity: SUV max 1.9  NECK: Motion artifact diminishes exam detail at the level of the soft tissues of neck. Incidental CT findings: none CHEST: No hypermetabolic axillary or supraclavicular lymph nodes. No hypermetabolic mediastinal or hilar lymph nodes. Moderate changes of paraseptal emphysema with diffuse bronchial wall thickening. Within the medial right upper lobe there is a nodule adjacent to a bulla measuring 1.2 x 0.8 cm, image 41/8. This is new when compared with 02/25/2015. The SUV max associated with this nodule  is equal to 4.04. Incidental CT findings: none ABDOMEN/PELVIS: No abnormal uptake within the liver, spleen, pancreas or adrenal glands. No hypermetabolic lymph nodes within the abdomen or pelvis. No hypermetabolic inguinal lymph nodes. Mild nonspecific uptake at the level of the anus has an SUV max of 3.87. Incidental CT findings: None SKELETON: There is decreased uptake within the L5 vertebra and sacrum likely reflecting changes due to external beam radiation. No suspicious hypermetabolic bone lesions identified. Incidental CT findings: none IMPRESSION: 1. Increased radiotracer uptake associated with right upper lobe pulmonary nodule is identified within SUV max of 4.04. Finding is worrisome for malignancy. Given the extensive smoking related changes within the lungs findings may reflect primary bronchogenic carcinoma versus metastatic disease from known anal neoplasm. 2. No additional foci of increased uptake suspicious for metastatic disease. Mild nonspecific increased uptake is identified at the level of the anus. 3.  Emphysema (ICD10-J43.9). Electronically Signed   By: Kerby Moors M.D.   On: 06/08/2018 16:38   Dg Chest Port 1 View  Result Date: 07/05/2018 CLINICAL DATA:  Follow-up pneumothorax EXAM: PORTABLE CHEST 1 VIEW COMPARISON:  07/02/2018 FINDINGS: Compared to the immediate prior film, the chest tube at the right base has been removed. Persistent pleural air and fluid at the right apex, similar to the  previous study. Left chest remains clear except for left base atelectasis. IMPRESSION: Removal of the right base chest tube. Persistent pleural air and fluid on the right. Pleural fluid may be slightly increased. Electronically Signed   By: Nelson Chimes M.D.   On: 07/05/2018 08:14   Dg Chest Port 1 View  Result Date: 07/04/2018 CLINICAL DATA:  Follow-up pneumothorax. EXAM: PORTABLE CHEST 1 VIEW COMPARISON:  07/03/2018 FINDINGS: RIGHT Port-A-Cath with tip overlying the RIGHT atrium and RIGHT thoracostomy tube again noted. Unchanged RIGHT apical pneumothorax, RIGHT pleural fluid and RIGHT LOWER lung opacity/atelectasis. Mild LEFT basilar atelectasis again noted. Little interval change from prior study. IMPRESSION: Unchanged appearance of the chest with RIGHT pneumothorax, pleural fluid and RIGHT LOWER lung opacity/atelectasis. Electronically Signed   By: Margarette Canada M.D.   On: 07/04/2018 09:46   Dg Chest Port 1 View  Result Date: 07/03/2018 CLINICAL DATA:  Followup right pneumothorax. EXAM: PORTABLE CHEST 1 VIEW COMPARISON:  07/02/2018 FINDINGS: Mild atelectasis persists at the left lung base. Right chest tube remains in place. Right apical pneumothorax appears similar. There is accumulating pleural fluid on the right. IMPRESSION: Accumulating pleural fluid on the right, with persistent pleural air at the apex. Persistent lower lung atelectasis on both sides. Electronically Signed   By: Nelson Chimes M.D.   On: 07/03/2018 11:02   Dg Chest Port 1 View  Result Date: 07/02/2018 CLINICAL DATA:  Pneumothorax. EXAM: PORTABLE CHEST 1 VIEW COMPARISON:  Radiograph of July 01, 2018. FINDINGS: Stable cardiomediastinal silhouette. Grossly stable right apical pneumothorax is noted. Right internal jugular Port-A-Cath is unchanged in position. Stable position of right pigtail drainage catheter. Stable right pleural effusion is noted with associated atelectasis of right lung base. Stable postsurgical changes are noted in  the right upper lobe. Stable mild left basilar subsegmental atelectasis is noted. Bony thorax is unremarkable. IMPRESSION: Grossly stable right apical pneumothorax. Stable position of right-sided pigtail pleural drainage catheter is noted. Stable moderate right pleural effusion is noted with associated atelectasis. Stable mild left basilar atelectasis. Electronically Signed   By: Marijo Conception, M.D.   On: 07/02/2018 08:47   Dg Chest Port 1 View  Result Date: 07/01/2018 CLINICAL DATA:  Follow-up pneumothorax. EXAM: PORTABLE CHEST 1 VIEW COMPARISON:  06/30/2018 at 1820 hours and older exams. FINDINGS: Right apical pneumothorax is similar to the most recent prior exam allowing for differences in patient positioning. There are stable post surgical changes with pulmonary anastomosis staples extending from adjacent to the right hila to the right apex. There is bilateral lung base opacity, which appears mildly increased on the right from the prior study, and stable on the left, consistent with atelectasis likely with small effusions. Right inferior hemithorax chest tube is stable. Remainder of the lungs is clear. Right anterior chest wall Port-A-Cath is also stable. IMPRESSION: 1. Right apical pneumothorax is without significant change from the most recent prior study. 2. Mild increase in right lung base opacity with stable left lung base opacity, consistent with atelectasis likely with small effusions. Electronically Signed   By: Lajean Manes M.D.   On: 07/01/2018 14:05   Dg Chest Port 1 View  Result Date: 06/30/2018 CLINICAL DATA:  Right chest tube placement. EXAM: PORTABLE CHEST 1 VIEW COMPARISON:  Chest radiograph performed earlier today at 5:36 p.m. FINDINGS: The patient's right-sided pneumothorax has decreased mildly in size. The right basilar pigtail catheter is unchanged in appearance. Bibasilar airspace opacities again likely reflect atelectasis. No significant pleural effusion is seen. The  cardiomediastinal silhouette is normal in size. A right-sided chest port is noted ending about the right atrium. No acute osseous abnormalities are identified. IMPRESSION: 1. Right-sided pneumothorax has decreased mildly in size. Right basilar pigtail catheter is unchanged in appearance. 2. Bibasilar airspace opacities again likely reflect atelectasis. Electronically Signed   By: Garald Balding M.D.   On: 06/30/2018 18:51   Dg Chest Portable 1 View  Result Date: 06/30/2018 CLINICAL DATA:  Chest tube placement EXAM: PORTABLE CHEST 1 VIEW COMPARISON:  06/30/2018 at 1433 hours FINDINGS: Right hydropneumothorax with a moderate pneumothorax component and a small residual pleural fluid component. Indwelling pigtail chest tube. Bilateral lower lobe atelectasis. Right chest port terminates in the upper right atrium. The heart is normal in size. IMPRESSION: Moderate right hydropneumothorax with indwelling pigtail chest tube, as above. Electronically Signed   By: Julian Hy M.D.   On: 06/30/2018 18:10   Dg Chest Port 1 View  Result Date: 06/27/2018 CLINICAL DATA:  Post chest tube removal status post partial lobectomy. EXAM: PORTABLE CHEST 1 VIEW COMPARISON:  06/27/2018 at 0637 hours FINDINGS: Chain sutures project over the right lung apex. Small stable right apical pneumothorax persists measuring up to 3.2 cm, previously 2.9 cm in estimation. Right-sided chest tube has been removed. No midline shift. Heart and mediastinal contours are stable. Port catheter tip terminates in the right atrium. No acute osseous abnormality. Soft tissue emphysema along the right lateral chest wall likely associated from chest tube insertion. IMPRESSION: Status post right-sided chest tube removal with stable right apical pneumothorax measuring up to 3.2 cm. Electronically Signed   By: Ashley Royalty M.D.   On: 06/27/2018 18:47   Dg Chest Port 1 View  Result Date: 06/27/2018 CLINICAL DATA:  Follow-up right pneumothorax EXAM: PORTABLE  CHEST 1 VIEW COMPARISON:  06/26/2018 FINDINGS: Postsurgical changes are again noted on the right. Right chest wall port is again seen. Chest tube is noted in place with small apical pneumothorax relatively stable from the prior exam. Left lung is well aerated with minimal atelectasis. No new focal abnormality is noted. IMPRESSION: Stable changes when compared with the prior exam. Electronically Signed   By: Inez Catalina M.D.   On: 06/27/2018  09:01   Dg Chest Port 1 View  Result Date: 06/26/2018 CLINICAL DATA:  Pneumothorax. EXAM: PORTABLE CHEST 1 VIEW COMPARISON:  06/25/2018 FINDINGS: Chest tube near the right lung apex has been removed. There continues to be a chest tube along the lateral aspect of the right chest. Right apical pneumothorax is stable. Again noted is right jugular Port-A-Cath with the tip extending into the upper right atrium. Slightly improved aeration at the left lung base with residual patchy densities. Heart size is stable. Trachea is midline. IMPRESSION: Stable right apical pneumothorax. One chest tube is still remaining. Slightly improving aeration in the left lung base. Electronically Signed   By: Markus Daft M.D.   On: 06/26/2018 09:24   Dg Chest Port 1 View  Result Date: 06/25/2018 CLINICAL DATA:  Chest tube, pneumothorax EXAM: PORTABLE CHEST 1 VIEW COMPARISON:  06/24/2018 FINDINGS: Right Port-A-Cath, chest tubes, central line remain in place, unchanged. Heart is normal size. Bibasilar atelectasis or infiltrates, left greater than right, slightly increased on the left since prior study. No effusions. Small right apical pneumothorax. IMPRESSION: Postoperative changes on the right with right chest tubes in place. Small right apical pneumothorax. Bibasilar opacities, left greater than right, worsening on the left since prior study. This could reflect atelectasis or pneumonia. Electronically Signed   By: Rolm Baptise M.D.   On: 06/25/2018 07:55   Dg Chest Port 1 View  Result Date:  06/24/2018 CLINICAL DATA:  Pneumothorax.  Chest tube EXAM: PORTABLE CHEST 1 VIEW COMPARISON:  Yesterday FINDINGS: Two right-sided chest tubes are in stable position. Central lines with tips in stable position. The porta catheter tip is at the inferior right atrium. Postoperative volume loss the right apex. Mild interstitial crowding/atelectasis. No effusion or pneumothorax. Normal heart size. IMPRESSION: 1. Lower lung volumes. 2. No visible pneumothorax. Electronically Signed   By: [name] M.D.   On: 06/24/2018 09:15   Dg Chest Port 1 View  Result Date: 06/23/2018 CLINICAL DATA:  Status post VATS EXAM: PORTABLE CHEST 1 VIEW COMPARISON:  06/21/2018 FINDINGS: Cardiac shadow is stable. Right jugular central line and right chest wall port are noted in satisfactory position. Two chest tubes are noted on the right. No evidence of pneumothorax is seen. Changes of recent VATS are noted in the right apex. No other focal abnormality is seen. IMPRESSION: Tubes and lines as described above. No evidence of pneumothorax. Electronically Signed   By: Inez Catalina M.D.   On: 06/23/2018 13:55    Labs:  CBC: Recent Labs    06/25/18 0549 06/29/18 0608 06/30/18 1445 07/05/18 0747  WBC 6.1 5.0 9.3 10.9*  HGB 13.2 13.4 14.0 11.4*  HCT 39.5 38.2* 41.2 32.9*  PLT 119* 115* 131* 157    COAGS: Recent Labs    06/21/18 1323 07/05/18 0747  INR 1.0 1.0  APTT 27  --     BMP: Recent Labs    06/25/18 0549 06/29/18 0608 06/30/18 1445 07/02/18 0302  NA 138 134* 139 135  K 4.3 4.4 4.4 4.2  CL 107 102 105 100  CO2 25 23 27 27   GLUCOSE 92 82 113* 107*  BUN 9 11 14 10   CALCIUM 8.8* 8.8* 9.2 8.9  CREATININE 0.99 0.93 1.03 1.06  GFRNONAA >60 >60 >60 >60  GFRAA >60 >60 >60 >60    LIVER FUNCTION TESTS: Recent Labs    06/21/18 1323 06/25/18 0549 06/29/18 0628 07/02/18 0302  BILITOT 1.0 1.1 1.8* 1.6*  AST 46* 87* 263* 97*  ALT  41 50* 288* 170*  ALKPHOS 54 45 68 70  PROT 6.0* 5.9* 6.2* 5.6*    ALBUMIN 3.7 3.7 3.7 3.2*    TUMOR MARKERS: No results for input(s): AFPTM, CEA, CA199, CHROMGRNA in the last 8760 hours.  Assessment and Plan:  RUL nodule wedge resection 06/23/18 PTX post procedure Chest tube placed 06/30/18- Dr Servando Snare Not complete resolution of PTX Chest tube dc'd yesterday Now IR to place new CT today Pt is aware of prcedure benefits and risks Including but not limited to Infection; bleeeding; damage to surround structures Consent signed in chart  Thank you for this interesting consult.  I greatly enjoyed meeting Lydia Meng and look forward to participating in their care.  A copy of this report was sent to the requesting provider on this date.  Electronically Signed: Lavonia Drafts, PA-C 07/05/2018, 9:42 AM   I spent a total of 40 Minutes    in face to face in clinical consultation, greater than 50% of which was counseling/coordinating care for right chest tube drain

## 2018-07-05 NOTE — Progress Notes (Signed)
Orders received from [name] to put CT to 20- cm of suction.   Clyde Canterbury, RN

## 2018-07-05 NOTE — Sedation Documentation (Signed)
Patient is resting comfortably. 

## 2018-07-05 NOTE — Progress Notes (Addendum)
  Subjective: Says he felt short of breath while moving around his room earlier this AM and "put my oxygen back on ".  Currently on 3L [place] with O2 sats 100%.  Objective: Vital signs in last 24 hours: Temp:  [98.2 F (36.8 C)-98.5 F (36.9 C)] 98.5 F (36.9 C) (03/16 0624) Pulse Rate:  [75-80] 79 (03/16 0624) Cardiac Rhythm: Normal sinus rhythm (03/16 0700) Resp:  [15-20] 20 (03/16 0624) BP: (107-126)/(72-95) 113/82 (03/16 0624) SpO2:  [94 %-100 %] 94 % (03/16 0624)    Intake/Output from previous day: 03/15 0701 - 03/16 0700 In: 970 [P.O.:880; I.V.:90] Out: 800 [Urine:800] Intake/Output this shift: No intake/output data recorded.  General appearance: alert, cooperative and no distress Heart: regularly irregular rhythm Chest: No subQ air. Lungs: Breath sounds clear bilaterally, diminished on right. CT site covered with aclean dry dressing.   Lab Results: No results for input(s): WBC, HGB, HCT, PLT in the last 72 hours. BMET: No results for input(s): NA, K, CL, CO2, GLUCOSE, BUN, CREATININE, CALCIUM in the last 72 hours.  PT/INR: No results for input(s): LABPROT, INR in the last 72 hours. ABG    Component Value Date/Time   PHART 7.343 (L) 06/24/2018 0425   HCO3 21.9 06/24/2018 0425   TCO2 23 06/24/2018 0425   ACIDBASEDEF 4.0 (H) 06/24/2018 0425   O2SAT 93.0 06/24/2018 0425   CBG (last 3)  No results for input(s): GLUCAP in the last 72 hours.  Assessment/Plan: Day 5 post placement of right pigtrail cath for a small right PTX following wedge resection of a RUL inflammatory mass and blebs.Pigtail catheter removed yesterday. CXR this AM has been done per patient but is not yet viewable.  He said he didn't think he needed the O2 and will try going without it this AM.   Will check on CXR later and monitor O2 sats. Possible discharge later today or in AM.    LOS: 5 days    Antony Odea 07/05/2018  Patient seen , chest xray reviewed Plan CT guided chest tube  placement to apex , poss anterior chest tube , does have right port cath Patient currently NPO for radiology procedure  I have seen and examined [name] and agree with the above assessment  and plan.  Grace Isaac MD Beeper [phone] Office [phone] 07/05/2018 10:46 AM

## 2018-07-05 NOTE — Progress Notes (Signed)
Pt complained that she has been feeling chest pressure same as before and short of breath. Pt has been off o2 most of the night. Now using 3l o2 via [place], sating 96%. Lung sound diminished in right base.Educated pt on Incentive spirometer , achived 750 ml. Pt asked to notify MD about his pain and SOB. Will pass on to oncoming shift. Will continue to monitor.

## 2018-07-05 NOTE — Progress Notes (Signed)
Pt received from IR w/ R apical pigtail CT to waterseal. VSS. Dinner tray ordered.  Clyde Canterbury, RN

## 2018-07-05 NOTE — Procedures (Signed)
Interventional Radiology Procedure Note  Procedure: Right apical 64F chest tube placed.  Left to wall suction via Atrium at -20 cm  Complications: None  Estimated Blood Loss: None  Recommendations: - Wall suction - CXR in am  Signed,  Criselda Peaches, MD

## 2018-07-06 ENCOUNTER — Inpatient Hospital Stay (HOSPITAL_COMMUNITY): Payer: Medicaid Other

## 2018-07-06 NOTE — Progress Notes (Signed)
Referring Physician(s): Dr Servando Snare  Supervising Physician: Dr Lucrezia Europe  Patient Status:  [facility] - In-pt  Chief Complaint:  Right chest tube drain placed in IR 3/16   Subjective:  Pt feels so much better today Breathing easier Less pain Chest skin site is tender  CXR today  IMPRESSION: 1. Right apical pneumothorax is small and decreased after new chest tube placement. 2. Loculated pleural fluid on the right.    Allergies: Nsaids and Sulfa antibiotics  Medications: Prior to Admission medications   Medication Sig Start Date End Date Taking? Authorizing Provider  acetaminophen (TYLENOL 8 HOUR) 650 MG CR tablet Take 1 tablet (650 mg total) by mouth every 8 (eight) hours as needed. 06/02/18  Yes Varney Biles, MD  albuterol (PROVENTIL HFA;VENTOLIN HFA) 108 (90 Base) MCG/ACT inhaler Inhale 1-2 puffs into the lungs every 6 (six) hours as needed for wheezing or shortness of breath.   Yes [provider]  BIKTARVY 50-200-25 MG TABS tablet TAKE 1 TABLET BY MOUTH DAILY Patient taking differently: Take 1 tablet by mouth daily.  06/11/18  Yes Michel Bickers, MD  cloNIDine (CATAPRES) 0.2 MG tablet Take 1 tablet (0.2 mg total) by mouth 2 (two) times daily. 06/28/18  Yes Lars Pinks M, PA-C  oxyCODONE (OXY IR/ROXICODONE) 5 MG immediate release tablet Take 1 tablet (5 mg total) by mouth every 4 (four) hours as needed for severe pain. 06/28/18  Yes Lars Pinks M, PA-C     Vital Signs: BP (!) 126/99 (BP Location: Right Arm)    Pulse 74    Temp 97.9 F (36.6 C) (Oral)    Resp 20    Ht 5\' 11"  (1.803 m)    Wt 151 lb 3.8 oz (68.6 kg)    SpO2 96%    BMI 21.09 kg/m   Physical Exam Vitals signs reviewed.  Pulmonary:     Effort: Pulmonary effort is normal.     Breath sounds: Normal breath sounds.  Skin:    General: Skin is warm and dry.     Comments: Skin site is clean and dry NT no bleeding No air leak in Pleurvac 330 cc serous fluid collected  Neurological:     Mental Status: He is alert.     Imaging: Dg Chest Port 1 View  Result Date: 07/06/2018 CLINICAL DATA:  Chest tube EXAM: PORTABLE CHEST 1 VIEW COMPARISON:  Two days ago FINDINGS: Right-sided chest tube with pigtail overlapping the apex where there is a small and decreased pneumothorax. Pleural fluid and lung opacity at the right base with some loculation. Normal heart size. Porta catheter with tip at the right atrium. IMPRESSION: 1. Right apical pneumothorax is small and decreased after new chest tube placement. 2. Loculated pleural fluid on the right. Electronically Signed   By: [name] M.D.   On: 07/06/2018 08:59   Dg Chest Port 1 View  Result Date: 07/05/2018 CLINICAL DATA:  Follow-up pneumothorax EXAM: PORTABLE CHEST 1 VIEW COMPARISON:  07/02/2018 FINDINGS: Compared to the immediate prior film, the chest tube at the right base has been removed. Persistent pleural air and fluid at the right apex, similar to the previous study. Left chest remains clear except for left base atelectasis. IMPRESSION: Removal of the right base chest tube. Persistent pleural air and fluid on the right. Pleural fluid may be slightly increased. Electronically Signed   By: Nelson Chimes M.D.   On: 07/05/2018 08:14   Dg Chest Port 1 View  Result Date: 07/04/2018 CLINICAL DATA:  Follow-up pneumothorax. EXAM: PORTABLE CHEST 1 VIEW COMPARISON:  07/03/2018 FINDINGS: RIGHT Port-A-Cath with tip overlying the RIGHT atrium and RIGHT thoracostomy tube again noted. Unchanged RIGHT apical pneumothorax, RIGHT pleural fluid and RIGHT LOWER lung opacity/atelectasis. Mild LEFT basilar atelectasis again noted. Little interval change from prior study. IMPRESSION: Unchanged appearance of the chest with RIGHT pneumothorax, pleural fluid and RIGHT LOWER lung opacity/atelectasis. Electronically Signed   By: Margarette Canada M.D.   On: 07/04/2018 09:46   Dg Chest Port 1 View  Result Date: 07/03/2018 CLINICAL DATA:  Followup right  pneumothorax. EXAM: PORTABLE CHEST 1 VIEW COMPARISON:  07/02/2018 FINDINGS: Mild atelectasis persists at the left lung base. Right chest tube remains in place. Right apical pneumothorax appears similar. There is accumulating pleural fluid on the right. IMPRESSION: Accumulating pleural fluid on the right, with persistent pleural air at the apex. Persistent lower lung atelectasis on both sides. Electronically Signed   By: Nelson Chimes M.D.   On: 07/03/2018 11:02   Ct Image Guided Fluid Drain By Catheter  Result Date: 07/05/2018 INDICATION: 48 year old male with a persistent right apical hydropneumothorax following wedge resection of the right upper lobe. He presents for placement of an image guided chest tube. EXAM: Chest tube placement under CT guidance MEDICATIONS: None ANESTHESIA/SEDATION: Fentanyl 100 mcg IV; Versed 2 mg IV Moderate Sedation Time:  20 minutes The patient was continuously monitored during the procedure by the interventional radiology nurse under my direct supervision. COMPLICATIONS: None immediate. PROCEDURE: Informed written consent was obtained from the patient after a thorough discussion of the procedural risks, benefits and alternatives. All questions were addressed. A timeout was performed prior to the initiation of the procedure. Axial CT imaging was performed. A suitable skin entry site was selected between the anterior aspects of the first and second ribs. The overlying skin was sterilely prepped and draped in the standard fashion using chlorhexidine skin prep. Local anesthesia was attained by infiltration with 1% lidocaine. Using trocar technique, a 10 French cook all-purpose drainage catheter was advanced between the ribs and into the pleural space. The catheter was advanced in the lung apex and formed. The catheter was connected to low wall suction via an atrium at -20 cm of water. The catheter was then secured to the skin with 0 Prolene suture and an adhesive fixation device. Post  drain placement imaging demonstrates a well-positioned drainage catheter in the right apex. IMPRESSION: Successful placement of a right apical 10 French pigtail drainage catheter. The catheter should be maintained to low wall suction via an atrium until the hydropneumothorax and any underlying air leak have resolved. Electronically Signed   By: Jacqulynn Cadet M.D.   On: 07/05/2018 17:32    Labs:  CBC: Recent Labs    06/25/18 0549 06/29/18 0608 06/30/18 1445 07/05/18 0747  WBC 6.1 5.0 9.3 10.9*  HGB 13.2 13.4 14.0 11.4*  HCT 39.5 38.2* 41.2 32.9*  PLT 119* 115* 131* 157    COAGS: Recent Labs    06/21/18 1323 07/05/18 0747  INR 1.0 1.0  APTT 27  --     BMP: Recent Labs    06/25/18 0549 06/29/18 0608 06/30/18 1445 07/02/18 0302  NA 138 134* 139 135  K 4.3 4.4 4.4 4.2  CL 107 102 105 100  CO2 25 23 27 27   GLUCOSE 92 82 113* 107*  BUN 9 11 14 10   CALCIUM 8.8* 8.8* 9.2 8.9  CREATININE 0.99 0.93 1.03 1.06  GFRNONAA >60 >60 >60 >60  GFRAA >60 >60 >  60 >60    LIVER FUNCTION TESTS: Recent Labs    06/21/18 1323 06/25/18 0549 06/29/18 0628 07/02/18 0302  BILITOT 1.0 1.1 1.8* 1.6*  AST 46* 87* 263* 97*  ALT 41 50* 288* 170*  ALKPHOS 54 45 68 70  PROT 6.0* 5.9* 6.2* 5.6*  ALBUMIN 3.7 3.7 3.7 3.2*    Assessment and Plan:  Right chest tube drain intact Will follow Plan per Dr Servando Snare  Electronically Signed: Lavonia Drafts, PA-C 07/06/2018, 2:20 PM   I spent a total of 15 Minutes at the the patient's bedside AND on the patient's hospital floor or unit, greater than 50% of which was counseling/coordinating care for right chest tube

## 2018-07-06 NOTE — Progress Notes (Addendum)
      [address]       [place] [address]             [phone]           Subjective: Patient just had bowel movement. Has some pain at right chest tube site.  Objective: Vital signs in last 24 hours: Temp:  [98 F (36.7 C)-98.5 F (36.9 C)] 98.4 F (36.9 C) (03/17 0330) Pulse Rate:  [69-118] 74 (03/17 0330) Cardiac Rhythm: Normal sinus rhythm (03/17 0700) Resp:  [16-27] 19 (03/17 0330) BP: (104-138)/(64-99) 112/99 (03/17 0330) SpO2:  [91 %-100 %] 96 % (03/17 0330)     Intake/Output from previous day: 03/16 0701 - 03/17 0700 In: 200 [P.O.:200] Out: 1254 [Urine:1000; Chest Tube:254]   Physical Exam:  Cardiovascular: RRR Pulmonary: Clear to auscultation on the left and diminished right apex Extremities: No lower extremity edema. Chest Tube: to suction, no air leak  Lab Results: CBC: Recent Labs    07/05/18 0747  WBC 10.9*  HGB 11.4*  HCT 32.9*  PLT 157   BMET:  No results for input(s): NA, K, CL, CO2, GLUCOSE, BUN, CREATININE, CALCIUM in the last 72 hours.  PT/INR:  Recent Labs    07/05/18 0747  LABPROT 12.7  INR 1.0   ABG:  INR: Will add last result for INR, ABG once components are confirmed Will add last 4 CBG results once components are confirmed  Assessment/Plan:  1. CV - SR in the 80's (walking in room). On Clonidine patch 0.2 mg bid 2.  Pulmonary - On 2 liters via [place].  IR consulted and a new 10 French pigtail catheter was placed yesterday. Chest tube to suction. There is no air leak. CXR this am appears to show patient rotated to the right and small right apical hydropneumothorax. As discussed with [name], will continue chest tube to suction for today. Encourage incentive spirometer.    Donielle M ZimmermanPA-C 07/06/2018,7:15 AM [phone]  I have seen and examined [name] and agree with the above assessment  and plan.  Grace Isaac MD Beeper [phone] Office [phone] 07/06/2018 7:57 PM

## 2018-07-07 ENCOUNTER — Inpatient Hospital Stay (HOSPITAL_COMMUNITY): Payer: Medicaid Other

## 2018-07-07 DIAGNOSIS — Z881 Allergy status to other antibiotic agents status: Secondary | ICD-10-CM

## 2018-07-07 DIAGNOSIS — Z95828 Presence of other vascular implants and grafts: Secondary | ICD-10-CM

## 2018-07-07 DIAGNOSIS — Z978 Presence of other specified devices: Secondary | ICD-10-CM

## 2018-07-07 DIAGNOSIS — Z21 Asymptomatic human immunodeficiency virus [HIV] infection status: Secondary | ICD-10-CM

## 2018-07-07 DIAGNOSIS — Z8709 Personal history of other diseases of the respiratory system: Secondary | ICD-10-CM

## 2018-07-07 DIAGNOSIS — Z85048 Personal history of other malignant neoplasm of rectum, rectosigmoid junction, and anus: Secondary | ICD-10-CM

## 2018-07-07 DIAGNOSIS — Z886 Allergy status to analgesic agent status: Secondary | ICD-10-CM

## 2018-07-07 DIAGNOSIS — J939 Pneumothorax, unspecified: Secondary | ICD-10-CM

## 2018-07-07 LAB — BASIC METABOLIC PANEL
Anion gap: 6 (ref 5–15)
BUN: 5 mg/dL — ABNORMAL LOW (ref 6–20)
CO2: 29 mmol/L (ref 22–32)
Calcium: 8.8 mg/dL — ABNORMAL LOW (ref 8.9–10.3)
Chloride: 99 mmol/L (ref 98–111)
Creatinine, Ser: 0.87 mg/dL (ref 0.61–1.24)
GFR calc Af Amer: >60 mL/min (ref >60)
GFR calc non Af Amer: >60 mL/min (ref >60)
Glucose, Bld: 98 mg/dL (ref 70–99)
Potassium: 3.9 mmol/L (ref 3.5–5.1)
Sodium: 134 mmol/L — ABNORMAL LOW (ref 135–145)

## 2018-07-07 NOTE — Progress Notes (Addendum)
      [address]       [place] [address]             [phone]           Subjective: Patient has some pain at right chest tube site.  Objective: Vital signs in last 24 hours: Temp:  [97.9 F (36.6 C)-98.6 F (37 C)] 98.6 F (37 C) (03/18 0352) Pulse Rate:  [69-76] 75 (03/18 0352) Cardiac Rhythm: Normal sinus rhythm (03/17 1944) Resp:  [13-25] 19 (03/18 0352) BP: (111-132)/(70-99) 111/85 (03/18 0352) SpO2:  [93 %-97 %] 94 % (03/18 0352)     Intake/Output from previous day: 03/17 0701 - 03/18 0700 In: 34 [P.O.:960] Out: 2680 [Urine:2600; Drains:80]   Physical Exam:  Cardiovascular: RRR Pulmonary: Clear to auscultation on the left and diminished right apex Extremities: No lower extremity edema. Chest Tube: to suction, no air leak  Lab Results: CBC: Recent Labs    07/05/18 0747  WBC 10.9*  HGB 11.4*  HCT 32.9*  PLT 157   BMET:  Recent Labs    07/07/18 0228  NA 134*  K 3.9  CL 99  CO2 29  GLUCOSE 98  BUN 5*  CREATININE 0.87  CALCIUM 8.8*    PT/INR:  Recent Labs    07/05/18 0747  LABPROT 12.7  INR 1.0   ABG:  INR: Will add last result for INR, ABG once components are confirmed Will add last 4 CBG results once components are confirmed  Assessment/Plan:  1. CV - SR in the 60-70's (walking in room). On Clonidine patch 0.2 mg bid 2.  Pulmonary - On room air. IR consulted and a new 10 French pigtail catheter was placed yesterday. Chest tube to suction. There is no air leak. CXR this am appears to show stable right apical pneumothorax and right pleural effusion. Hope to place chest tube to water seal. Encourage incentive spirometer.    Donielle M ZimmermanPA-C 07/07/2018,7:17 AM [phone]   No air leak I have seen and examined Jeremiah [name] and agree with the above assessment  and plan.  Grace Isaac MD Beeper [phone] Office [phone] 07/07/2018 7:44 PM

## 2018-07-07 NOTE — TOC Initial Note (Signed)
Transition of Care [facility]) - Initial/Assessment Note    Patient Details  Name: Jeremiah [name] MRN: [id number] Date of Birth: 11-Aug-1970  Transition of [facility] CM/SW Contact:    Carles Collet, RN Phone Number: 07/07/2018, 10:32 AM  Clinical Narrative:               Damaris Schooner w patient over the phone. He states that he lives at home alone, verified address and phone number in Epic is correct. He states that he obtains transportation through the bus system. His PCP is ID clinic, Dr Megan Salon. He gets his 042 meds at [facility] through ADAP program free of cost. He verified he has inhaler and clonidine at home. He states he has a brother and sister in law in town that he call if needed for assistance. He was provided with [facility] upon last DC on 06/28/2018.   Has chest tube, he states it may get DC'd prior to discharge. Notes state planning on water seal soon. Will need to follow for potential home needs.   CM will continue to follow for DC needs.      Expected Discharge Plan: Home/Self Care Barriers to Discharge: Continued Medical Work up, Inadequate or no insurance   Patient Goals and CMS Choice        Expected Discharge Plan and Services Expected Discharge Plan: Home/Self Care Discharge Planning Services: CM Consult   Living arrangements for the past 2 months: Apartment                          Prior Living Arrangements/Services Living arrangements for the past 2 months: Apartment Lives with:: Self Patient language and need for interpreter reviewed:: No Do you feel safe going back to the place where you live?: Yes               Activities of Daily Living Home Assistive Devices/Equipment: None ADL Screening (condition at time of admission) Patient's cognitive ability adequate to safely complete daily activities?: Yes Is the patient deaf or have difficulty hearing?: No Does the patient have difficulty seeing, even when wearing glasses/contacts?: No Does the patient have  difficulty concentrating, remembering, or making decisions?: No Patient able to express need for assistance with ADLs?: Yes Does the patient have difficulty dressing or bathing?: No Independently performs ADLs?: Yes (appropriate for developmental age) Does the patient have difficulty walking or climbing stairs?: Yes Weakness of Legs: Both Weakness of Arms/Hands: None  Permission Sought/Granted                  Emotional Assessment       Orientation: : Oriented to Self, Oriented to Place, Oriented to  Time, Oriented to Situation      Admission diagnosis:  Chest tube in place [Z96.89] Pneumothorax, unspecified type [J93.9] Patient Active Problem List   Diagnosis Date Noted  . Pneumothorax on left 06/30/2018  . Mass of upper lobe of right lung 06/23/2018  . 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[place]) 01/06/2018  . Chronic pain 03/31/2016  . Anal cancer ([place]) 11/07/2015  . Hyperglycemia 11/07/2015  . Gonorrhea 10/30/2015  . CAP (community acquired pneumonia) 02/25/2015  . Hemorrhoids 02/25/2015  . Human immunodeficiency virus (HIV) disease ([place]) 05/19/2013  . Dyslipidemia 05/19/2013  . Cigarette smoker 05/19/2013  . Depression 05/19/2013  . Dental caries 05/19/2013  . Hx of unilateral orchiectomy 05/19/2013  . Latent syphilis 05/06/2013  . Anal warts 05/06/2013  . History of chlamydia 05/06/2013   PCP:  [facility]:   [facility] [place], [address] [place] [address] Phone: [phone] Fax: [phone]  [id number] - [place], [place] [address] [place] [address] Phone: [phone] Fax: [place], [address] [place] [address] Phone: [phone] Fax:  [phone]     Social Determinants of Health (SDOH) Interventions    Readmission Risk Interventions 30 Day Unplanned Readmission Risk Score     ED to Hosp-Admission (Current) from 06/30/2018 in [facility] 4E CV SURGICAL PROGRESSIVE CARE  30 Day Unplanned Readmission Risk Score (%)  20 Filed at 07/07/2018 0801     This score is the patient's risk of an unplanned readmission within 30 days of being discharged (0 -100%). The score is based on dignosis, age, lab data, medications, orders, and past utilization.   Low:  0-14.9   Medium: 15-21.9   High: 22-29.9   Extreme: 30 and above       No flowsheet data found.

## 2018-07-07 NOTE — Progress Notes (Signed)
Patient ID: Jeremiah [name], male   DOB: Jun 26, 1970, 48 y.o.   MRN: [id number]         [facility] for Infectious Disease  Date of Admission:  06/30/2018     ASSESSMENT: He had an asymptomatic right upper lobe granuloma resected recently.  I am monitoring culture results and will get him back into clinic soon.  He will continue Biktarvy for now.  We will repeat his routine CD4 and HIV viral load tomorrow morning.  PLAN: 1. Continue Biktarvy 2. CD4 and HIV viral load tomorrow morning 3. He has follow-up arranged with me 4. Please call if I can be of further assistance while he is here.  Principal Problem:   Pneumothorax on left Active Problems:   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H[facility])   Human immunodeficiency virus (HIV) disease ([place])   Anal cancer ([place])   Dyslipidemia   Cigarette smoker   Depression   Dental caries   Scheduled Meds: . bictegravir-emtricitabine-tenofovir AF  1 tablet Oral Daily  . bisacodyl  10 mg Oral Daily  . cloNIDine  0.2 mg Oral BID  . enoxaparin (LOVENOX) injection  40 mg Subcutaneous Daily  . lactulose  20 g Oral Daily  . senna-docusate  1 tablet Oral QHS  . sodium chloride flush  3 mL Intravenous Once  . sodium chloride flush  5 mL Intracatheter Q8H   Continuous Infusions: . dextrose 5 % and 0.2 % NaCl with KCl 20 mEq 10 mL/hr at 07/05/18 0626   PRN Meds:.albuterol, fentaNYL (SUBLIMAZE) injection, ondansetron (ZOFRAN) IV, oxyCODONE, traMADol   SUBJECTIVE: Jeremiah [name] has longstanding HIV infection and underwent treatment for anal cancer while living in [place] last year.  As part of that work-up he was found to have a right lung nodule.  That was evaluated further recently and he underwent VATS resection on 06/23/2018.  Pathology review revealed necrotizing granulomatous inflammation.  No malignancy was noted.  AFB and fungal stains are negative and cultures are pending.  He has not been febrile or had any other signs and symptoms to suggest  active pneumonia.  He was readmitted recently with recurrent pneumothorax and has had 2 chest tube placements.  He is anxious but feeling better.  He denies missing any doses of his Biktarvy.  Review of Systems: Review of Systems  Constitutional: Negative for chills, diaphoresis and fever.  Respiratory: Negative for cough.   Cardiovascular: Positive for chest pain.  Gastrointestinal: Negative for abdominal pain, diarrhea, nausea and vomiting.    Allergies  Allergen Reactions  . Nsaids Other (See Comments)    DRUG INTERACTION WITH HIV MEDS  . Sulfa Antibiotics Itching    OBJECTIVE: Vitals:   07/07/18 0033 07/07/18 0352 07/07/18 0843 07/07/18 1207  BP: 116/80 111/85 122/84 128/84  Pulse: 76 75 74 70  Resp: 19 19 (!) 23 19  Temp: 98.2 F (36.8 C) 98.6 F (37 C) 98.4 F (36.9 C) 98.1 F (36.7 C)  TempSrc: Oral Oral Oral Oral  SpO2: 94% 94% 93% 94%  Weight:      Height:       Body mass index is 21.09 kg/m.  Physical Exam Constitutional:      Comments: Uncomfortable with his chest tube but otherwise in good spirits.  Cardiovascular:     Rate and Rhythm: Normal rate and regular rhythm.     Heart sounds: No murmur.  Pulmonary:     Effort: Pulmonary effort is normal.     Breath sounds: Normal breath sounds.  Chest:  Lab Results Lab Results  Component Value Date   WBC 10.9 (H) 07/05/2018   HGB 11.4 (L) 07/05/2018   HCT 32.9 (L) 07/05/2018   MCV 104.1 (H) 07/05/2018   PLT 157 07/05/2018    Lab Results  Component Value Date   CREATININE 0.87 07/07/2018   BUN 5 (L) 07/07/2018   NA 134 (L) 07/07/2018   K 3.9 07/07/2018   CL 99 07/07/2018   CO2 29 07/07/2018    Lab Results  Component Value Date   ALT 170 (H) 07/02/2018   AST 97 (H) 07/02/2018   ALKPHOS 70 07/02/2018   BILITOT 1.6 (H) 07/02/2018     Microbiology: No results found for this or any previous visit (from the past 240 hour(s)).  Michel Bickers, MD [facility] [phone] pager   [phone] cell 07/07/2018, 3:20 PM

## 2018-07-07 NOTE — Plan of Care (Signed)
  Problem: Clinical Measurements: Goal: Respiratory complications will improve Outcome: Progressing   Problem: Activity: Goal: Risk for activity intolerance will decrease Outcome: Progressing   Problem: Safety: Goal: Ability to remain free from injury will improve Outcome: Progressing   

## 2018-07-07 NOTE — Progress Notes (Signed)
Referring Physician(s): [name]  Supervising Physician: Jacqulynn Cadet  Patient Status:  [facility] - In-pt  Chief Complaint: Follow up right apical chest tube placed 3/16 by Dr. Laurence Ferrari  Subjective:  Patient reports breathing is good, able to ambulate without getting short of breath. Does c/o some intermittent right sided chest pain and some pain at chest tube insertion site. Appetite has been good. He is looking forward to having the tube removed at some point.  Allergies: Nsaids and Sulfa antibiotics  Medications: Prior to Admission medications   Medication Sig Start Date End Date Taking? Authorizing Provider  acetaminophen (TYLENOL 8 HOUR) 650 MG CR tablet Take 1 tablet (650 mg total) by mouth every 8 (eight) hours as needed. 06/02/18  Yes Varney Biles, MD  albuterol (PROVENTIL HFA;VENTOLIN HFA) 108 (90 Base) MCG/ACT inhaler Inhale 1-2 puffs into the lungs every 6 (six) hours as needed for wheezing or shortness of breath.   Yes [provider]  BIKTARVY 50-200-25 MG TABS tablet TAKE 1 TABLET BY MOUTH DAILY Patient taking differently: Take 1 tablet by mouth daily.  06/11/18  Yes Michel Bickers, MD  cloNIDine (CATAPRES) 0.2 MG tablet Take 1 tablet (0.2 mg total) by mouth 2 (two) times daily. 06/28/18  Yes Lars Pinks M, PA-C  oxyCODONE (OXY IR/ROXICODONE) 5 MG immediate release tablet Take 1 tablet (5 mg total) by mouth every 4 (four) hours as needed for severe pain. 06/28/18  Yes Lars Pinks M, PA-C     Vital Signs: BP 122/84 (BP Location: Right Arm)   Pulse 74   Temp 98.4 F (36.9 C) (Oral)   Resp (!) 23   Ht 5\' 11"  (1.803 m)   Wt 151 lb 3.8 oz (68.6 kg)   SpO2 93%   BMI 21.09 kg/m   Physical Exam Vitals signs and nursing note reviewed.  Constitutional:      General: He is not in acute distress. HENT:     Head: Normocephalic.  Cardiovascular:     Rate and Rhythm: Normal rate.  Pulmonary:     Effort: Pulmonary effort is normal.   Breath sounds: Normal breath sounds.     Comments: (+) right chest tube to suction; 400 cc serosanguineous OP in pleurvac. (-) air leak. Insertion site unremarkable.  Abdominal:     General: There is no distension.     Palpations: Abdomen is soft.     Tenderness: There is no abdominal tenderness.  Skin:    General: Skin is warm and dry.  Neurological:     Mental Status: He is alert. Mental status is at baseline.     Imaging: Dg Chest Port 1 View  Result Date: 07/07/2018 CLINICAL DATA:  Follow-up pneumothorax EXAM: PORTABLE CHEST 1 VIEW COMPARISON:  07/06/2018 FINDINGS: Cardiac shadow is stable. Right chest wall port is again seen. Right chest tube is again noted superiorly. Postsurgical changes on the right are noted. Minimal apical pneumothorax is again identified and stable. Loculated pleural fluid is noted inferolaterally. Minimal bibasilar atelectatic changes are seen. IMPRESSION: Stable minimal right apical pneumothorax. Somewhat loculated right pleural effusion. Mild bibasilar atelectasis. Electronically Signed   By: Inez Catalina M.D.   On: 07/07/2018 08:01   Dg Chest Port 1 View  Result Date: 07/06/2018 CLINICAL DATA:  Chest tube EXAM: PORTABLE CHEST 1 VIEW COMPARISON:  Two days ago FINDINGS: Right-sided chest tube with pigtail overlapping the apex where there is a small and decreased pneumothorax. Pleural fluid and lung opacity at the right base with some  loculation. Normal heart size. Porta catheter with tip at the right atrium. IMPRESSION: 1. Right apical pneumothorax is small and decreased after new chest tube placement. 2. Loculated pleural fluid on the right. Electronically Signed   By: [name] M.D.   On: 07/06/2018 08:59   Dg Chest Port 1 View  Result Date: 07/05/2018 CLINICAL DATA:  Follow-up pneumothorax EXAM: PORTABLE CHEST 1 VIEW COMPARISON:  07/02/2018 FINDINGS: Compared to the immediate prior film, the chest tube at the right base has been removed. Persistent  pleural air and fluid at the right apex, similar to the previous study. Left chest remains clear except for left base atelectasis. IMPRESSION: Removal of the right base chest tube. Persistent pleural air and fluid on the right. Pleural fluid may be slightly increased. Electronically Signed   By: Nelson Chimes M.D.   On: 07/05/2018 08:14   Dg Chest Port 1 View  Result Date: 07/04/2018 CLINICAL DATA:  Follow-up pneumothorax. EXAM: PORTABLE CHEST 1 VIEW COMPARISON:  07/03/2018 FINDINGS: RIGHT Port-A-Cath with tip overlying the RIGHT atrium and RIGHT thoracostomy tube again noted. Unchanged RIGHT apical pneumothorax, RIGHT pleural fluid and RIGHT LOWER lung opacity/atelectasis. Mild LEFT basilar atelectasis again noted. Little interval change from prior study. IMPRESSION: Unchanged appearance of the chest with RIGHT pneumothorax, pleural fluid and RIGHT LOWER lung opacity/atelectasis. Electronically Signed   By: Margarette Canada M.D.   On: 07/04/2018 09:46   Ct Image Guided Fluid Drain By Catheter  Result Date: 07/05/2018 INDICATION: 48 year old male with a persistent right apical hydropneumothorax following wedge resection of the right upper lobe. He presents for placement of an image guided chest tube. EXAM: Chest tube placement under CT guidance MEDICATIONS: None ANESTHESIA/SEDATION: Fentanyl 100 mcg IV; Versed 2 mg IV Moderate Sedation Time:  20 minutes The patient was continuously monitored during the procedure by the interventional radiology nurse under my direct supervision. COMPLICATIONS: None immediate. PROCEDURE: Informed written consent was obtained from the patient after a thorough discussion of the procedural risks, benefits and alternatives. All questions were addressed. A timeout was performed prior to the initiation of the procedure. Axial CT imaging was performed. A suitable skin entry site was selected between the anterior aspects of the first and second ribs. The overlying skin was sterilely prepped  and draped in the standard fashion using chlorhexidine skin prep. Local anesthesia was attained by infiltration with 1% lidocaine. Using trocar technique, a 10 French cook all-purpose drainage catheter was advanced between the ribs and into the pleural space. The catheter was advanced in the lung apex and formed. The catheter was connected to low wall suction via an atrium at -20 cm of water. The catheter was then secured to the skin with 0 Prolene suture and an adhesive fixation device. Post drain placement imaging demonstrates a well-positioned drainage catheter in the right apex. IMPRESSION: Successful placement of a right apical 10 French pigtail drainage catheter. The catheter should be maintained to low wall suction via an atrium until the hydropneumothorax and any underlying air leak have resolved. Electronically Signed   By: Jacqulynn Cadet M.D.   On: 07/05/2018 17:32    Labs:  CBC: Recent Labs    06/25/18 0549 06/29/18 0608 06/30/18 1445 07/05/18 0747  WBC 6.1 5.0 9.3 10.9*  HGB 13.2 13.4 14.0 11.4*  HCT 39.5 38.2* 41.2 32.9*  PLT 119* 115* 131* 157    COAGS: Recent Labs    06/21/18 1323 07/05/18 0747  INR 1.0 1.0  APTT 27  --  BMP: Recent Labs    06/29/18 0608 06/30/18 1445 07/02/18 0302 07/07/18 0228  NA 134* 139 135 134*  K 4.4 4.4 4.2 3.9  CL 102 105 100 99  CO2 23 27 27 29   GLUCOSE 82 113* 107* 98  BUN 11 14 10  5*  CALCIUM 8.8* 9.2 8.9 8.8*  CREATININE 0.93 1.03 1.06 0.87  GFRNONAA >60 >60 >60 >60  GFRAA >60 >60 >60 >60    LIVER FUNCTION TESTS: Recent Labs    06/21/18 1323 06/25/18 0549 06/29/18 0628 07/02/18 0302  BILITOT 1.0 1.1 1.8* 1.6*  AST 46* 87* 263* 97*  ALT 41 50* 288* 170*  ALKPHOS 54 45 68 70  PROT 6.0* 5.9* 6.2* 5.6*  ALBUMIN 3.7 3.7 3.7 3.2*    Assessment and Plan:  48 y/o M s/p right apical chest tube placement 3/16 in IR by Dr. Laurence Ferrari. Patient still c/o some right sided chest pain, denies dyspnea, saturating well on  RA. ~400 cc serosanguineous output in pleurvac today, remains to suction, no air leak. CXR this AM shows stable minimal right apical pneumothorax, somewhat loculated right pleural effusion.  Per CT surgery plans to place to water seal soon.  IR will continue to follow along - further plans per CT surgery. Please call with questions or concerns.   Electronically Signed: Joaquim Nam, PA-C 07/07/2018, 11:08 AM   I spent a total of 15 Minutes at the the patient's bedside AND on the patient's hospital floor or unit, greater than 50% of which was counseling/coordinating care for chest tube follow up.

## 2018-07-08 ENCOUNTER — Inpatient Hospital Stay (HOSPITAL_COMMUNITY): Payer: Medicaid Other

## 2018-07-08 LAB — T-HELPER CELLS (CD4) COUNT (NOT AT ARMC)
CD4 % Helper T Cell: 17 % — ABNORMAL LOW (ref 33–55)
CD4 T Cell Abs: 260 /uL — ABNORMAL LOW (ref 400–2700)

## 2018-07-08 MED ORDER — FENTANYL CITRATE (PF) 100 MCG/2ML IJ SOLN
25.0000 ug | Freq: Once | INTRAMUSCULAR | Status: AC
  Administered 2018-07-08: 25 ug via INTRAVENOUS
  Filled 2018-07-08: qty 2

## 2018-07-08 NOTE — Progress Notes (Signed)
Patient upset that this RN would not give oxycodone and fentanyl together. Pt states that is the way he has been getting it and became more and more upset. Pt states that he knows what he can have and he will call the doctor because he is not going to have this conversation with every nurse. Pt gave choice of fentanyl or oxycodone. Pt states he gets his medication every thirty minutes. Pt stated pain 5/10. Both medications ordered for severe pain. Note placed for doctor to address. Pt understands that we will wait at least one hour before getting another pain medication. Times when patient can get medication noted on white board in room. Pt resting with call bell within reach.  Will continue to monitor.

## 2018-07-08 NOTE — Progress Notes (Signed)
Patient was given fentanyl at [place]. Patient called out about 0110 stating that his IV was leaking. This RN assessed IV, flushed it and removed IV due to it leaking. IV team placed another IV. Patient called this RN back into the room stating that, "he needed another shot". Patient was informed that he could not get another "shot" since he just had one and that there was no way to tell how much he received.   Patient then stated", that he knows his body and he didn't get any of the fentanyl because he does not feel anything". Again the patient was informed that he could not receive another shot at this time and that he would have to wait.    Patient acknowledged that he understood and that he was going to try to get some sleep. Patient asked this RN if she could come in at 5 am and give him the shot. Patient was told that he would have to call and request the pain medicine.   Will continue to monitor

## 2018-07-08 NOTE — Progress Notes (Signed)
Referring Physician(s): [name]  Supervising Physician: Arne Cleveland  Patient Status:  [name]  Chief Complaint: Follow up right apical chest tube placed 3/16 by Dr. Laurence Ferrari  Subjective:  Patient sitting up in bed, reports some discomfort in his right chest after tube was removed today. He is very happy the tube was removed and is hopeful he can go home soon, but doesn't want to leave too early and get sick again. He is very thankful for all of the care he has received.   Allergies: Nsaids and Sulfa antibiotics  Medications: Prior to Admission medications   Medication Sig Start Date End Date Taking? Authorizing Provider  acetaminophen (TYLENOL 8 HOUR) 650 MG CR tablet Take 1 tablet (650 mg total) by mouth every 8 (eight) hours as needed. 06/02/18  Yes Varney Biles, MD  albuterol (PROVENTIL HFA;VENTOLIN HFA) 108 (90 Base) MCG/ACT inhaler Inhale 1-2 puffs into the lungs every 6 (six) hours as needed for wheezing or shortness of breath.   Yes [provider]  BIKTARVY 50-200-25 MG TABS tablet TAKE 1 TABLET BY MOUTH DAILY Patient taking differently: Take 1 tablet by mouth daily.  06/11/18  Yes Michel Bickers, MD  cloNIDine (CATAPRES) 0.2 MG tablet Take 1 tablet (0.2 mg total) by mouth 2 (two) times daily. 06/28/18  Yes Lars Pinks M, PA-C  oxyCODONE (OXY IR/ROXICODONE) 5 MG immediate release tablet Take 1 tablet (5 mg total) by mouth every 4 (four) hours as needed for severe pain. 06/28/18  Yes Lars Pinks M, PA-C     Vital Signs: BP 119/82   Pulse 68   Temp 97.9 F (36.6 C) (Oral)   Resp 15   Ht 5\' 11"  (1.803 m)   Wt 151 lb 3.8 oz (68.6 kg)   SpO2 95%   BMI 21.09 kg/m   Physical Exam Vitals signs and nursing note reviewed.  Constitutional:      General: He is not in acute distress. HENT:     Head: Normocephalic.  Cardiovascular:     Rate and Rhythm: Normal rate.  Pulmonary:     Effort: Pulmonary effort is normal.     Comments:  Dressing over former right apical chest tube site. Neurological:     Mental Status: He is alert. Mental status is at baseline.     Imaging: Dg Chest Port 1 View  Result Date: 07/08/2018 CLINICAL DATA:  Chest tube. EXAM: PORTABLE CHEST 1 VIEW COMPARISON:  07/07/2018. FINDINGS: Port-A-Cath and right chest tube in stable position. Stable small right apical pneumothorax. Postsurgical changes right lung again noted. Atelectatic changes right base. Right-sided pleural thickening/effusion again noted. No interim change. Mild left base atelectasis, improved from prior exam. Stable heart size. IMPRESSION: 1. Port-A-Cath and right chest tube in stable position. Stable small right apical pneumothorax. 2. Postsurgical changes right lung. Atelectatic changes right lung base. Right-sided pleural thickening/effusion again noted. Similar findings noted on prior exam. 3.  Mild left base atelectasis, improved from prior exam. Electronically Signed   By: Ben Hill   On: 07/08/2018 06:30   Dg Chest Port 1 View  Result Date: 07/07/2018 CLINICAL DATA:  Follow-up pneumothorax EXAM: PORTABLE CHEST 1 VIEW COMPARISON:  07/06/2018 FINDINGS: Cardiac shadow is stable. Right chest wall port is again seen. Right chest tube is again noted superiorly. Postsurgical changes on the right are noted. Minimal apical pneumothorax is again identified and stable. Loculated pleural fluid is noted inferolaterally. Minimal bibasilar atelectatic changes are seen. IMPRESSION: Stable minimal right apical pneumothorax.  Somewhat loculated right pleural effusion. Mild bibasilar atelectasis. Electronically Signed   By: Inez Catalina M.D.   On: 07/07/2018 08:01   Dg Chest Port 1 View  Result Date: 07/06/2018 CLINICAL DATA:  Chest tube EXAM: PORTABLE CHEST 1 VIEW COMPARISON:  Two days ago FINDINGS: Right-sided chest tube with pigtail overlapping the apex where there is a small and decreased pneumothorax. Pleural fluid and lung opacity at the  right base with some loculation. Normal heart size. Porta catheter with tip at the right atrium. IMPRESSION: 1. Right apical pneumothorax is small and decreased after new chest tube placement. 2. Loculated pleural fluid on the right. Electronically Signed   By: [name] M.D.   On: 07/06/2018 08:59   Dg Chest Port 1 View  Result Date: 07/05/2018 CLINICAL DATA:  Follow-up pneumothorax EXAM: PORTABLE CHEST 1 VIEW COMPARISON:  07/02/2018 FINDINGS: Compared to the immediate prior film, the chest tube at the right base has been removed. Persistent pleural air and fluid at the right apex, similar to the previous study. Left chest remains clear except for left base atelectasis. IMPRESSION: Removal of the right base chest tube. Persistent pleural air and fluid on the right. Pleural fluid may be slightly increased. Electronically Signed   By: Nelson Chimes M.D.   On: 07/05/2018 08:14   Ct Image Guided Fluid Drain By Catheter  Result Date: 07/05/2018 INDICATION: 48 year old male with a persistent right apical hydropneumothorax following wedge resection of the right upper lobe. He presents for placement of an image guided chest tube. EXAM: Chest tube placement under CT guidance MEDICATIONS: None ANESTHESIA/SEDATION: Fentanyl 100 mcg IV; Versed 2 mg IV Moderate Sedation Time:  20 minutes The patient was continuously monitored during the procedure by the interventional radiology nurse under my direct supervision. COMPLICATIONS: None immediate. PROCEDURE: Informed written consent was obtained from the patient after a thorough discussion of the procedural risks, benefits and alternatives. All questions were addressed. A timeout was performed prior to the initiation of the procedure. Axial CT imaging was performed. A suitable skin entry site was selected between the anterior aspects of the first and second ribs. The overlying skin was sterilely prepped and draped in the standard fashion using chlorhexidine skin prep.  Local anesthesia was attained by infiltration with 1% lidocaine. Using trocar technique, a 10 French cook all-purpose drainage catheter was advanced between the ribs and into the pleural space. The catheter was advanced in the lung apex and formed. The catheter was connected to low wall suction via an atrium at -20 cm of water. The catheter was then secured to the skin with 0 Prolene suture and an adhesive fixation device. Post drain placement imaging demonstrates a well-positioned drainage catheter in the right apex. IMPRESSION: Successful placement of a right apical 10 French pigtail drainage catheter. The catheter should be maintained to low wall suction via an atrium until the hydropneumothorax and any underlying air leak have resolved. Electronically Signed   By: Jacqulynn Cadet M.D.   On: 07/05/2018 17:32    Labs:  CBC: Recent Labs    06/25/18 0549 06/29/18 0608 06/30/18 1445 07/05/18 0747  WBC 6.1 5.0 9.3 10.9*  HGB 13.2 13.4 14.0 11.4*  HCT 39.5 38.2* 41.2 32.9*  PLT 119* 115* 131* 157    COAGS: Recent Labs    06/21/18 1323 07/05/18 0747  INR 1.0 1.0  APTT 27  --     BMP: Recent Labs    06/29/18 0608 06/30/18 1445 07/02/18 0302 07/07/18 0228  NA  134* 139 135 134*  K 4.4 4.4 4.2 3.9  CL 102 105 100 99  CO2 23 27 27 29   GLUCOSE 82 113* 107* 98  BUN 11 14 10  5*  CALCIUM 8.8* 9.2 8.9 8.8*  CREATININE 0.93 1.03 1.06 0.87  GFRNONAA >60 >60 >60 >60  GFRAA >60 >60 >60 >60    LIVER FUNCTION TESTS: Recent Labs    06/21/18 1323 06/25/18 0549 06/29/18 0628 07/02/18 0302  BILITOT 1.0 1.1 1.8* 1.6*  AST 46* 87* 263* 97*  ALT 41 50* 288* 170*  ALKPHOS 54 45 68 70  PROT 6.0* 5.9* 6.2* 5.6*  ALBUMIN 3.7 3.7 3.7 3.2*    Assessment and Plan:  48 y/o M s/p right apical chest tube placement 3/16 in IR by Dr. Laurence Ferrari. Chest tube was removed today by CT surgery PA, some pain over previous insertion site but otherwise doing well. No complaints of dyspnea or cough.   Further plans per CT surgery. Please call IR with questions or concerns.  Electronically Signed: Joaquim Nam, PA-C 07/08/2018, 2:06 PM   I spent a total of 15 Minutes at the the patient's bedside AND on the patient's hospital floor or unit, greater than 50% of which was counseling/coordinating care for right apical chest tube follow up.

## 2018-07-08 NOTE — Progress Notes (Signed)
  Subjective: Still complains of level 6/10 pain at pigtail insertion site. No shortness of breath.   Objective: Vital signs in last 24 hours: Temp:  [97.9 F (36.6 C)-98.7 F (37.1 C)] 97.9 F (36.6 C) (03/19 0833) Pulse Rate:  [68-72] 68 (03/19 1000) Cardiac Rhythm: Normal sinus rhythm (03/19 0700) Resp:  [13-27] 15 (03/19 0833) BP: (111-128)/(67-91) 119/82 (03/19 1000) SpO2:  [94 %-95 %] 95 % (03/19 0833)    Intake/Output from previous day: 03/18 0701 - 03/19 0700 In: 845 [P.O.:845] Out: 700 [Urine:650; Drains:30; Chest Tube:20] Intake/Output this shift: Total I/O In: 160 [P.O.:160] Out: -   General appearance: alert, cooperative and mild distress Heart: regular rate and rhythm Lungs: Clear, diminished right base. No air leak seen. Pleural pigtail catheter on water seal past 24 hours.  Wound: Insertion site dry. Old pigtail insertion site on right lateral chest well healed.   Lab Results: No results for input(s): WBC, HGB, HCT, PLT in the last 72 hours. BMET:  Recent Labs    07/07/18 0228  NA 134*  K 3.9  CL 99  CO2 29  GLUCOSE 98  BUN 5*  CREATININE 0.87  CALCIUM 8.8*    PT/INR: No results for input(s): LABPROT, INR in the last 72 hours. ABG    Component Value Date/Time   PHART 7.343 (L) 06/24/2018 0425   HCO3 21.9 06/24/2018 0425   TCO2 23 06/24/2018 0425   ACIDBASEDEF 4.0 (H) 06/24/2018 0425   O2SAT 93.0 06/24/2018 0425   CBG (last 3)  No results for input(s): GLUCAP in the last 72 hours.  Assessment/Plan:  Recurrent right hydropneumothorax following resection of right upper lobe granulomatous lesion.  This is been treated with placement of a pigtail catheter 3 days ago by interventional radiology.  The pneumothorax has stabilized and now is comprised of an expected small apical airspace.  There is no active air leak.  Minimal drainage from the chest tube.  Will remove the pleural tube today and repeat a PA and lateral chest x-ray tomorrow.    LOS: 8 days    Antony Odea, PA-C [phone] 07/08/2018

## 2018-07-09 ENCOUNTER — Inpatient Hospital Stay (HOSPITAL_COMMUNITY): Payer: Medicaid Other

## 2018-07-09 MED ORDER — OXYCODONE-ACETAMINOPHEN 5-325 MG PO TABS: 1.0000 | ORAL_TABLET | Freq: Four times a day (QID) | ORAL | 0 refills | Status: AC | PRN

## 2018-07-09 NOTE — Progress Notes (Signed)
  Subjective: No new problems or concerns.  Objective: Vital signs in last 24 hours: Temp:  [97.9 F (36.6 C)-98.2 F (36.8 C)] 98 F (36.7 C) (03/20 0555) Pulse Rate:  [66-82] 66 (03/20 0100) Cardiac Rhythm: Normal sinus rhythm (03/20 0555) Resp:  [13-21] 13 (03/20 0555) BP: (104-124)/(70-93) 121/93 (03/20 0555) SpO2:  [93 %-98 %] 98 % (03/20 0555)     Intake/Output from previous day: 03/19 0701 - 03/20 0700 In: 1360 [P.O.:1360] Out: -  Intake/Output this shift: No intake/output data recorded.  General appearance: alert, cooperative and mild distress Heart: regular rate and rhythm Lungs: Clear, diminished right base. No air leak. Chest tube removed yesterday, site is dry. No subQ air. CXR reviewed, No change in right apical air space.  Lab Results: No results for input(s): WBC, HGB, HCT, PLT in the last 72 hours. BMET:  Recent Labs    07/07/18 0228  NA 134*  K 3.9  CL 99  CO2 29  GLUCOSE 98  BUN 5*  CREATININE 0.87  CALCIUM 8.8*    PT/INR: No results for input(s): LABPROT, INR in the last 72 hours. ABG    Component Value Date/Time   PHART 7.343 (L) 06/24/2018 0425   HCO3 21.9 06/24/2018 0425   TCO2 23 06/24/2018 0425   ACIDBASEDEF 4.0 (H) 06/24/2018 0425   O2SAT 93.0 06/24/2018 0425   CBG (last 3)  No results for input(s): GLUCAP in the last 72 hours.  Assessment/Plan:  Recurrent right hydropneumothorax following resection of right upper lobe granulomatous lesion.  This is been treated with placement of a pigtail catheter 4 days ago by interventional radiology.  The pneumothorax has stabilized and air leak and drainage subsided. The Chest tube was removed yesterday and CXR is unchanged and he is clinically stable with no respiratory distress. He is maintaining adequate O2 saturations on RA. Plan discharge to home today.  LOS: 9 days    Antony Odea , [place] [phone] 07/09/2018

## 2018-07-13 ENCOUNTER — Encounter: Payer: Self-pay | Admitting: Pulmonary Disease

## 2018-07-13 ENCOUNTER — Other Ambulatory Visit: Payer: Self-pay

## 2018-07-13 ENCOUNTER — Ambulatory Visit (INDEPENDENT_AMBULATORY_CARE_PROVIDER_SITE_OTHER): Payer: Medicaid Other | Admitting: Pulmonary Disease

## 2018-07-13 ENCOUNTER — Ambulatory Visit: Payer: Self-pay | Admitting: Pulmonary Disease

## 2018-07-13 VITALS — BP 100/74 | HR 77 | Ht 71.0 in | Wt 151.9 lb

## 2018-07-13 DIAGNOSIS — R911 Solitary pulmonary nodule: Secondary | ICD-10-CM | POA: Diagnosis not present

## 2018-07-13 DIAGNOSIS — F17210 Nicotine dependence, cigarettes, uncomplicated: Secondary | ICD-10-CM | POA: Diagnosis not present

## 2018-07-13 DIAGNOSIS — B20 Human immunodeficiency virus [HIV] disease: Secondary | ICD-10-CM | POA: Diagnosis not present

## 2018-07-13 DIAGNOSIS — J8410 Pulmonary fibrosis, unspecified: Secondary | ICD-10-CM | POA: Diagnosis not present

## 2018-07-13 MED ORDER — NICOTINE 21 MG/24HR TD PT24: 21.0000 mg | MEDICATED_PATCH | Freq: Every day | TRANSDERMAL | 0 refills | Status: DC

## 2018-07-13 MED ORDER — NICOTINE POLACRILEX 4 MG MT LOZG: 4.0000 mg | LOZENGE | OROMUCOSAL | 3 refills | Status: DC | PRN

## 2018-07-13 NOTE — Assessment & Plan Note (Signed)
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

## 2018-07-13 NOTE — Assessment & Plan Note (Signed)
Assessment: Recently stop smoking on 07/06/2018 Not currently using any smoking cessation aids  Plan: Can start 21 mg nicotine patches Can start 4 mg nicotine lozenges Continue to not smoke

## 2018-07-13 NOTE — Assessment & Plan Note (Signed)
Assessment: Currently managed on Biktarvy  Plan: Continue follow-up with infectious disease

## 2018-07-13 NOTE — Assessment & Plan Note (Signed)
Assessment: Status post wedge resection  Plan: CT without contrast in 3 months

## 2018-07-13 NOTE — Patient Instructions (Signed)
We will order a CT to follow their history of a right upper lobe nodule as well as status post wedge resection to be completed in June/2020  We recommend that you stop smoking.  >>> Congratulations on stopping smoking!  Keep up the hard work >>>If you have friends or family who smoke, let them know you are trying to quit and not to smoke around you or in your living environment  Smoking Cessation Resources:  1 [place]  >>> Patient to call this resource and utilize it to help support her quit smoking >>> Keep up your hard work with stopping smoking  You can also contact the [facility] >>>For smoking cessation classes call [phone]  We do not recommend using e-cigarettes as a form of stopping smoking  You can sign up for smoking cessation support texts and information:  >>>https://smokefree.gov/smokefreetxt    Nicotine patches: >>>Make sure you rotate sites that you do not get skin irritation, Apply 1 patch each morning to a non-hairy skin site  If you are smoking greater than 10 cigarettes/day and weigh over 45 kg start with the nicotine patch of 21 mg a day for 6 weeks, then 14 mg a day for 2 weeks, then finished with 7 mg a day for 2 weeks, then stop  >>>If insomnia occurs you are having trouble sleeping you can take the patch off at night, and place a new one on in the morning >>>If the patch is removed at night and you have morning cravings start short acting nicotine replacement therapy such as gum or lozenges   >>>Avoid acidic beverages such as coffee, carbonated beverages before and during gum/lozenge use.  A soft acidic beverages lower oral pH which cause nicotine to not be absorbed properly >>>If you chew the gum too quickly or vigorously you could have nausea, vomiting, abdominal pain, constipation, hiccups, headache, sore jaw, mouth irritation ulcers  Nicotine lozenge: Lozenges are commonly uses short acting NRT product  >>>Smokers who smoke  within 30 minutes of awakening should use 4 mg dose >>>Smokers who wait more than 30 minutes after awakening to smoke should use 2 mg dose  Can use up to 1 lozenge every 1-2 hours for 6 weeks >>>Total amount of lozenges that can be used per day as 20 >>>Gradually reduce number of lozenges used per day after 2 weeks of use  Place lozenge in mouth and allowed to dissolve for 30 minutes loss and does not need to be chewed  Lozenges have advantages to be able to be used in people with TMG, poor dentition, dentures    Keep scheduled follow-up with infectious disease and let them know about the results from your wedge resection  Keep follow-up with [facility] as ordered  Follow-up with our office after completing CT    Coronavirus (COVID-19) Are you at risk?  Are you at risk for the Coronavirus (COVID-19)?  To be considered HIGH RISK for Coronavirus (COVID-19), you have to meet the following criteria:  . Traveled to [place], [place], [place], [place] or [place]; or in the [place] to [place], [place], [place], or [place]; and have fever, cough, and shortness of breath within the last 2 weeks of travel OR . Been in close contact with a person diagnosed with COVID-19 within the last 2 weeks and have fever, cough, and shortness of breath . IF YOU DO NOT MEET THESE CRITERIA, YOU ARE CONSIDERED LOW RISK FOR COVID-19.  What to do if you  are HIGH RISK for COVID-19?  Marland Kitchen If you are having a medical emergency, call 911. . Seek medical care right away. Before you go to a doctor's office, urgent care or emergency department, call ahead and tell them about your recent travel, contact with someone diagnosed with COVID-19, and your symptoms. You should receive instructions from your physician's office regarding next steps of care.  . When you arrive at healthcare provider, tell the healthcare staff immediately you have returned from visiting [place], [place], [place], [place] or [place]; or traveled in  the [place] to [place], [place], [place], or [place]; in the last two weeks or you have been in close contact with a person diagnosed with COVID-19 in the last 2 weeks.   . Tell the health care staff about your symptoms: fever, cough and shortness of breath. . After you have been seen by a medical provider, you will be either: o Tested for (COVID-19) and discharged home on quarantine except to seek medical care if symptoms worsen, and asked to  - Stay home and avoid contact with others until you get your results (4-5 days)  - Avoid travel on public transportation if possible (such as bus, train, or airplane) or o Sent to the Emergency Department by EMS for evaluation, COVID-19 testing, and possible admission depending on your condition and test results.  What to do if you are LOW RISK for COVID-19?  Reduce your risk of any infection by using the same precautions used for avoiding the common cold or flu:  Marland Kitchen Wash your hands often with soap and warm water for at least 20 seconds.  If soap and water are not readily available, use an alcohol-based hand sanitizer with at least 60% alcohol.  . If coughing or sneezing, cover your mouth and nose by coughing or sneezing into the elbow areas of your shirt or coat, into a tissue or into your sleeve (not your hands). . Avoid shaking hands with others and consider head nods or verbal greetings only. . Avoid touching your eyes, nose, or mouth with unwashed hands.  . Avoid close contact with people who are sick. . Avoid places or events with large numbers of people in one location, like concerts or sporting events. . Carefully consider travel plans you have or are making. . If you are planning any travel outside or inside the [place], visit the CDC's Travelers' Health webpage for the latest health notices. . If you have some symptoms but not all symptoms, continue to monitor at home and seek medical attention if your symptoms worsen. . If you are  having a medical emergency, call 911.   [place] / e-Visit: [email or web address]         [facility]: [facility]: [phone]                   [facility]: [phone]           It is flu season:   >>> Best ways to protect herself from the flu: Receive the yearly flu vaccine, practice good hand hygiene washing with soap and also using hand sanitizer when available, eat a nutritious meals, get adequate rest, hydrate appropriately   Please contact the office if your symptoms worsen or you have concerns that you are not improving.   Thank you for choosing  [facility] for your healthcare, and for allowing [place] to partner  with you on your healthcare journey. I am thankful to be able to provide care to you today.   Wyn Quaker FNP-C

## 2018-07-13 NOTE — Progress Notes (Signed)
@Patient  ID: Jeremiah [name], male    DOB: Feb 09, 1971, 48 y.o.   MRN: [id number]  Chief Complaint  Patient presents with   Follow-up    Denies any issues with breathing at this time.     Referring provider: [facility]  HPI:  48 year old male current every day smoker followed in our office for right upper lobe nodule  PMH: HIV (managed on Biktarvy), history of syphilis and gonorrhea Smoker/ Smoking History: Former smoker.  Quit smoking 07/06/2018.  24-pack-year smoking history. Maintenance:  none Pt of: [name]  07/13/2018  - Visit   48 year old male former smoker.  Patient recently stop smoking on 07/06/2018.  Presented to our office today for follow-up visit.  After last office visit patient was referred to CVTS and patient completed a wedge resection with Dr. Pia Mau.  There is no complications after completing the wedge resection which required additional hospitalization with chest tube placement.  Surgical pathology report listed below:  06/23/2018-surgical pathology report- lung wedge biopsy necrotizing granulomatous inflammation associated fibrosis, no evidence of malignancy  Patient reports that he has had some occasional shortness of breath since hospital discharge.  Patient reports he has scheduled follow-up with infectious disease on 07/19/2018.  Patient is currently managed with infectious disease due to his HIV diagnosis which is currently managed on Biktarvy.       Tests:   07/09/2018-chest x-ray-unchanged right apical pneumothorax and right pleural fluid  CT chest, abdomen pelvis 05/22/2018- 1.7 cm right upper lobe nodule, 2.1 cm right hilar lymph node.  No significant abnormality in the abdomen or pelvis.  PET scan 06/08/2018- FDG uptake in right upper lobe lung nodule, SUV 4.04.  No additional uptake elsewhere in the body or in the mediastinum.  Mild nonspecific uptake in the fat at the level of anus.  06/10/2018-pulmonary function test- FVC 4.95  (112% predicted), postbronchodilator ratio 76, postbronchodilator FEV1 3.83 (108% predicted), no positive bronchodilator response, DLCO 77 >>>Minimal airway obstructive disease, minimal diffusion defect  06/23/2018-AFB negative 06/23/2018- and aerobic anaerobic lymph node-rare WBC negative 06/23/2018-surgical pathology report- lung wedge biopsy necrotizing granulomatous inflammation associated fibrosis, no evidence of malignancy 06/23/2018- lymph node culture-fungus-no fungus   FENO:  No results found for: NITRICOXIDE  PFT: PFT Results Latest Ref Rng & Units 06/10/2018  FVC-Pre L 4.95  FVC-Predicted Pre % 112  FVC-Post L 5.01  FVC-Predicted Post % 113  Pre FEV1/FVC % % 71  Post FEV1/FCV % % 76  FEV1-Pre L 3.52  FEV1-Predicted Pre % 99  FEV1-Post L 3.83  DLCO UNC% % 77  DLCO COR %Predicted % 89  TLC L 6.65  TLC % Predicted % 93  RV % Predicted % 97    Imaging: Dg Chest 2 View  Result Date: 07/09/2018 CLINICAL DATA:  Pneumothorax EXAM: CHEST - 2 VIEW COMPARISON:  Yesterday FINDINGS: Interval removal of chest tube. Small right apical pneumothorax. Stable loculated pleural fluid laterally and at the base on the right. Porta catheter with tip at the right atrium. Mild atelectatic type opacity on the left. IMPRESSION: Unchanged small right apical pneumothorax and right pleural fluid. Electronically Signed   By: [name] M.D.   On: 07/09/2018 06:56   Dg Chest 2 View  Result Date: 06/30/2018 CLINICAL DATA:  Acute onset of shortness of breath and right-sided chest pain. EXAM: CHEST - 2 VIEW COMPARISON:  Chest radiograph performed 06/29/2018 FINDINGS: There is been interval increase in the size of the patient's large right-sided pneumothorax, involving approximately 40%  of the right hemithorax. A small right pleural effusion is noted. Worsening right basilar airspace opacity and persistent mild left basilar opacity likely reflects atelectasis, though would correlate clinically to exclude  pneumonia. A right-sided chest port is noted ending about the proximal right atrium. The heart is normal in size; the mediastinal contour is within normal limits. No acute osseous abnormalities are seen. Scattered soft tissue air is noted at the right lower chest wall, similar in appearance to the prior study. IMPRESSION: 1. Interval increase in size of large right-sided pneumothorax, involving approximately 40% of the right hemithorax. Small right pleural effusion noted. 2. Worsening right basilar airspace opacity and persistent mild left basilar opacity likely reflects atelectasis, though would correlate clinically to exclude pneumonia. 3. Scattered soft tissue air at the right lower chest wall, similar in appearance to the recent prior study. Critical Value/emergent results were called by telephone at the time of interpretation on 06/30/2018 at 2:48 pm to Dr. Aletta Edouard, who verbally acknowledged these results. Electronically Signed   By: Garald Balding M.D.   On: 06/30/2018 14:50   Dg Chest 2 View  Result Date: 06/29/2018 CLINICAL DATA:  Chest pain and shortness of breath EXAM: CHEST - 2 VIEW COMPARISON:  06/28/2018 FINDINGS: Postsurgical changes are again noted in the right apex. The previously seen right-sided pneumothorax has increased slightly in the interval from the prior exam. There is now approximately 4 cm excursion from the chest wall superiorly. Bibasilar atelectatic changes are noted increased from the prior exam. The left lung shows no pneumothorax. Right chest wall port is again seen and stable. Cardiac shadow is stable. IMPRESSION: Increasing right-sided pneumothorax. Increased bibasilar atelectasis. Critical Value/emergent results were called by telephone at the time of interpretation on 06/29/2018 at 6:37 am to Dr. Veatrice Kells , who verbally acknowledged these results. Electronically Signed   By: Inez Catalina M.D.   On: 06/29/2018 06:37   Dg Chest 2 View  Result Date:  06/28/2018 CLINICAL DATA:  Partial nephrectomy and pneumothorax EXAM: CHEST - 2 VIEW COMPARISON:  Yesterday FINDINGS: Small right apical pneumothorax, 2 rib interspaces in height-possibly decreased from yesterday (although different positioning). Mild atelectasis at the bases, increased. Chest wall emphysema. Normal heart size. IMPRESSION: 1. Stable to decreased small right apical pneumothorax. 2. Increased atelectasis. Electronically Signed   By: [name] M.D.   On: 06/28/2018 07:33   Dg Chest 2 View  Result Date: 06/21/2018 CLINICAL DATA:  Preoperative evaluation for upcoming VATS EXAM: CHEST - 2 VIEW COMPARISON:  06/08/2018 FINDINGS: Cardiac shadows within normal limits. Right-sided chest wall port is noted in satisfactory position. The lungs are well aerated bilaterally. Emphysematous changes are seen in the right apex similar to that noted on prior CT. The known right upper lobe mass lesion is not as well appreciated as on prior CT. IMPRESSION: No acute abnormality noted. Emphysematous changes are seen. The known right upper lobe mass is not well appreciated. Electronically Signed   By: Inez Catalina M.D.   On: 06/21/2018 13:55   Dg Chest Port 1 View  Result Date: 07/08/2018 CLINICAL DATA:  Chest tube. EXAM: PORTABLE CHEST 1 VIEW COMPARISON:  07/07/2018. FINDINGS: Port-A-Cath and right chest tube in stable position. Stable small right apical pneumothorax. Postsurgical changes right lung again noted. Atelectatic changes right base. Right-sided pleural thickening/effusion again noted. No interim change. Mild left base atelectasis, improved from prior exam. Stable heart size. IMPRESSION: 1. Port-A-Cath and right chest tube in stable position. Stable small right apical pneumothorax.  2. Postsurgical changes right lung. Atelectatic changes right lung base. Right-sided pleural thickening/effusion again noted. Similar findings noted on prior exam. 3.  Mild left base atelectasis, improved from prior exam.  Electronically Signed   By: [place]   On: 07/08/2018 06:30   Dg Chest Port 1 View  Result Date: 07/07/2018 CLINICAL DATA:  Follow-up pneumothorax EXAM: PORTABLE CHEST 1 VIEW COMPARISON:  07/06/2018 FINDINGS: Cardiac shadow is stable. Right chest wall port is again seen. Right chest tube is again noted superiorly. Postsurgical changes on the right are noted. Minimal apical pneumothorax is again identified and stable. Loculated pleural fluid is noted inferolaterally. Minimal bibasilar atelectatic changes are seen. IMPRESSION: Stable minimal right apical pneumothorax. Somewhat loculated right pleural effusion. Mild bibasilar atelectasis. Electronically Signed   By: Inez Catalina M.D.   On: 07/07/2018 08:01   Dg Chest Port 1 View  Result Date: 07/06/2018 CLINICAL DATA:  Chest tube EXAM: PORTABLE CHEST 1 VIEW COMPARISON:  Two days ago FINDINGS: Right-sided chest tube with pigtail overlapping the apex where there is a small and decreased pneumothorax. Pleural fluid and lung opacity at the right base with some loculation. Normal heart size. Porta catheter with tip at the right atrium. IMPRESSION: 1. Right apical pneumothorax is small and decreased after new chest tube placement. 2. Loculated pleural fluid on the right. Electronically Signed   By: [name] M.D.   On: 07/06/2018 08:59   Dg Chest Port 1 View  Result Date: 07/05/2018 CLINICAL DATA:  Follow-up pneumothorax EXAM: PORTABLE CHEST 1 VIEW COMPARISON:  07/02/2018 FINDINGS: Compared to the immediate prior film, the chest tube at the right base has been removed. Persistent pleural air and fluid at the right apex, similar to the previous study. Left chest remains clear except for left base atelectasis. IMPRESSION: Removal of the right base chest tube. Persistent pleural air and fluid on the right. Pleural fluid may be slightly increased. Electronically Signed   By: Nelson Chimes M.D.   On: 07/05/2018 08:14   Dg Chest Port 1 View  Result  Date: 07/04/2018 CLINICAL DATA:  Follow-up pneumothorax. EXAM: PORTABLE CHEST 1 VIEW COMPARISON:  07/03/2018 FINDINGS: RIGHT Port-A-Cath with tip overlying the RIGHT atrium and RIGHT thoracostomy tube again noted. Unchanged RIGHT apical pneumothorax, RIGHT pleural fluid and RIGHT LOWER lung opacity/atelectasis. Mild LEFT basilar atelectasis again noted. Little interval change from prior study. IMPRESSION: Unchanged appearance of the chest with RIGHT pneumothorax, pleural fluid and RIGHT LOWER lung opacity/atelectasis. Electronically Signed   By: Margarette Canada M.D.   On: 07/04/2018 09:46   Dg Chest Port 1 View  Result Date: 07/03/2018 CLINICAL DATA:  Followup right pneumothorax. EXAM: PORTABLE CHEST 1 VIEW COMPARISON:  07/02/2018 FINDINGS: Mild atelectasis persists at the left lung base. Right chest tube remains in place. Right apical pneumothorax appears similar. There is accumulating pleural fluid on the right. IMPRESSION: Accumulating pleural fluid on the right, with persistent pleural air at the apex. Persistent lower lung atelectasis on both sides. Electronically Signed   By: Nelson Chimes M.D.   On: 07/03/2018 11:02   Dg Chest Port 1 View  Result Date: 07/02/2018 CLINICAL DATA:  Pneumothorax. EXAM: PORTABLE CHEST 1 VIEW COMPARISON:  Radiograph of July 01, 2018. FINDINGS: Stable cardiomediastinal silhouette. Grossly stable right apical pneumothorax is noted. Right internal jugular Port-A-Cath is unchanged in position. Stable position of right pigtail drainage catheter. Stable right pleural effusion is noted with associated atelectasis of right lung base. Stable postsurgical changes are noted in the right  upper lobe. Stable mild left basilar subsegmental atelectasis is noted. Bony thorax is unremarkable. IMPRESSION: Grossly stable right apical pneumothorax. Stable position of right-sided pigtail pleural drainage catheter is noted. Stable moderate right pleural effusion is noted with associated atelectasis.  Stable mild left basilar atelectasis. Electronically Signed   By: Marijo Conception, M.D.   On: 07/02/2018 08:47   Dg Chest Port 1 View  Result Date: 07/01/2018 CLINICAL DATA:  Follow-up pneumothorax. EXAM: PORTABLE CHEST 1 VIEW COMPARISON:  06/30/2018 at 1820 hours and older exams. FINDINGS: Right apical pneumothorax is similar to the most recent prior exam allowing for differences in patient positioning. There are stable post surgical changes with pulmonary anastomosis staples extending from adjacent to the right hila to the right apex. There is bilateral lung base opacity, which appears mildly increased on the right from the prior study, and stable on the left, consistent with atelectasis likely with small effusions. Right inferior hemithorax chest tube is stable. Remainder of the lungs is clear. Right anterior chest wall Port-A-Cath is also stable. IMPRESSION: 1. Right apical pneumothorax is without significant change from the most recent prior study. 2. Mild increase in right lung base opacity with stable left lung base opacity, consistent with atelectasis likely with small effusions. Electronically Signed   By: Lajean Manes M.D.   On: 07/01/2018 14:05   Dg Chest Port 1 View  Result Date: 06/30/2018 CLINICAL DATA:  Right chest tube placement. EXAM: PORTABLE CHEST 1 VIEW COMPARISON:  Chest radiograph performed earlier today at 5:36 p.m. FINDINGS: The patient's right-sided pneumothorax has decreased mildly in size. The right basilar pigtail catheter is unchanged in appearance. Bibasilar airspace opacities again likely reflect atelectasis. No significant pleural effusion is seen. The cardiomediastinal silhouette is normal in size. A right-sided chest port is noted ending about the right atrium. No acute osseous abnormalities are identified. IMPRESSION: 1. Right-sided pneumothorax has decreased mildly in size. Right basilar pigtail catheter is unchanged in appearance. 2. Bibasilar airspace opacities again  likely reflect atelectasis. Electronically Signed   By: Garald Balding M.D.   On: 06/30/2018 18:51   Dg Chest Portable 1 View  Result Date: 06/30/2018 CLINICAL DATA:  Chest tube placement EXAM: PORTABLE CHEST 1 VIEW COMPARISON:  06/30/2018 at 1433 hours FINDINGS: Right hydropneumothorax with a moderate pneumothorax component and a small residual pleural fluid component. Indwelling pigtail chest tube. Bilateral lower lobe atelectasis. Right chest port terminates in the upper right atrium. The heart is normal in size. IMPRESSION: Moderate right hydropneumothorax with indwelling pigtail chest tube, as above. Electronically Signed   By: Julian Hy M.D.   On: 06/30/2018 18:10   Dg Chest Port 1 View  Result Date: 06/27/2018 CLINICAL DATA:  Post chest tube removal status post partial lobectomy. EXAM: PORTABLE CHEST 1 VIEW COMPARISON:  06/27/2018 at 0637 hours FINDINGS: Chain sutures project over the right lung apex. Small stable right apical pneumothorax persists measuring up to 3.2 cm, previously 2.9 cm in estimation. Right-sided chest tube has been removed. No midline shift. Heart and mediastinal contours are stable. Port catheter tip terminates in the right atrium. No acute osseous abnormality. Soft tissue emphysema along the right lateral chest wall likely associated from chest tube insertion. IMPRESSION: Status post right-sided chest tube removal with stable right apical pneumothorax measuring up to 3.2 cm. Electronically Signed   By: Ashley Royalty M.D.   On: 06/27/2018 18:47   Dg Chest Port 1 View  Result Date: 06/27/2018 CLINICAL DATA:  Follow-up right pneumothorax EXAM: PORTABLE  CHEST 1 VIEW COMPARISON:  06/26/2018 FINDINGS: Postsurgical changes are again noted on the right. Right chest wall port is again seen. Chest tube is noted in place with small apical pneumothorax relatively stable from the prior exam. Left lung is well aerated with minimal atelectasis. No new focal abnormality is noted.  IMPRESSION: Stable changes when compared with the prior exam. Electronically Signed   By: Inez Catalina M.D.   On: 06/27/2018 09:01   Dg Chest Port 1 View  Result Date: 06/26/2018 CLINICAL DATA:  Pneumothorax. EXAM: PORTABLE CHEST 1 VIEW COMPARISON:  06/25/2018 FINDINGS: Chest tube near the right lung apex has been removed. There continues to be a chest tube along the lateral aspect of the right chest. Right apical pneumothorax is stable. Again noted is right jugular Port-A-Cath with the tip extending into the upper right atrium. Slightly improved aeration at the left lung base with residual patchy densities. Heart size is stable. Trachea is midline. IMPRESSION: Stable right apical pneumothorax. One chest tube is still remaining. Slightly improving aeration in the left lung base. Electronically Signed   By: Markus Daft M.D.   On: 06/26/2018 09:24   Dg Chest Port 1 View  Result Date: 06/25/2018 CLINICAL DATA:  Chest tube, pneumothorax EXAM: PORTABLE CHEST 1 VIEW COMPARISON:  06/24/2018 FINDINGS: Right Port-A-Cath, chest tubes, central line remain in place, unchanged. Heart is normal size. Bibasilar atelectasis or infiltrates, left greater than right, slightly increased on the left since prior study. No effusions. Small right apical pneumothorax. IMPRESSION: Postoperative changes on the right with right chest tubes in place. Small right apical pneumothorax. Bibasilar opacities, left greater than right, worsening on the left since prior study. This could reflect atelectasis or pneumonia. Electronically Signed   By: Rolm Baptise M.D.   On: 06/25/2018 07:55   Dg Chest Port 1 View  Result Date: 06/24/2018 CLINICAL DATA:  Pneumothorax.  Chest tube EXAM: PORTABLE CHEST 1 VIEW COMPARISON:  Yesterday FINDINGS: Two right-sided chest tubes are in stable position. Central lines with tips in stable position. The porta catheter tip is at the inferior right atrium. Postoperative volume loss the right apex. Mild interstitial  crowding/atelectasis. No effusion or pneumothorax. Normal heart size. IMPRESSION: 1. Lower lung volumes. 2. No visible pneumothorax. Electronically Signed   By: [name] M.D.   On: 06/24/2018 09:15   Dg Chest Port 1 View  Result Date: 06/23/2018 CLINICAL DATA:  Status post VATS EXAM: PORTABLE CHEST 1 VIEW COMPARISON:  06/21/2018 FINDINGS: Cardiac shadow is stable. Right jugular central line and right chest wall port are noted in satisfactory position. Two chest tubes are noted on the right. No evidence of pneumothorax is seen. Changes of recent VATS are noted in the right apex. No other focal abnormality is seen. IMPRESSION: Tubes and lines as described above. No evidence of pneumothorax. Electronically Signed   By: Inez Catalina M.D.   On: 06/23/2018 13:55   Ct Image Guided Fluid Drain By Catheter  Result Date: 07/05/2018 INDICATION: 48 year old male with a persistent right apical hydropneumothorax following wedge resection of the right upper lobe. He presents for placement of an image guided chest tube. EXAM: Chest tube placement under CT guidance MEDICATIONS: None ANESTHESIA/SEDATION: Fentanyl 100 mcg IV; Versed 2 mg IV Moderate Sedation Time:  20 minutes The patient was continuously monitored during the procedure by the interventional radiology nurse under my direct supervision. COMPLICATIONS: None immediate. PROCEDURE: Informed written consent was obtained from the patient after a thorough discussion of the procedural risks,  benefits and alternatives. All questions were addressed. A timeout was performed prior to the initiation of the procedure. Axial CT imaging was performed. A suitable skin entry site was selected between the anterior aspects of the first and second ribs. The overlying skin was sterilely prepped and draped in the standard fashion using chlorhexidine skin prep. Local anesthesia was attained by infiltration with 1% lidocaine. Using trocar technique, a 10 French cook all-purpose  drainage catheter was advanced between the ribs and into the pleural space. The catheter was advanced in the lung apex and formed. The catheter was connected to low wall suction via an atrium at -20 cm of water. The catheter was then secured to the skin with 0 Prolene suture and an adhesive fixation device. Post drain placement imaging demonstrates a well-positioned drainage catheter in the right apex. IMPRESSION: Successful placement of a right apical 10 French pigtail drainage catheter. The catheter should be maintained to low wall suction via an atrium until the hydropneumothorax and any underlying air leak have resolved. Electronically Signed   By: Jacqulynn Cadet M.D.   On: 07/05/2018 17:32      Specialty Problems      Pulmonary Problems   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[place])    06/23/2018-surgical pathology report- lung wedge biopsy necrotizing granulomatous inflammation associated fibrosis, no evidence of malignancy      Lung nodule   Pneumothorax on left      Allergies  Allergen Reactions   Nsaids Other (See Comments)    DRUG INTERACTION WITH HIV MEDS   Sulfa Antibiotics Itching    Immunization History  Administered Date(s) Administered   Hepatitis A, Adult 10/25/2013, 05/11/2014   Influenza,inj,Quad PF,6+ Mos 05/06/2013, 05/11/2014, 01/02/2015, 03/31/2016, 01/06/2018   PPD Test 05/06/2013   Pneumococcal Polysaccharide-23 05/03/2013    Past Medical History:  Diagnosis Date   Anal warts    COPD (chronic obstructive pulmonary disease) ([place])    Hemorrhoids 02/25/2015   History of tuberculin skin testing within last year 2014   HIV (human immunodeficiency virus infection) ([place])    Pneumonia 2007   Pneumothorax 07/01/2018   LEFT    Tobacco History: Social History   Tobacco Use  Smoking Status Former Smoker   Packs/day: 1.00   Years: 27.00   Pack years: 27.00   Types: Cigarettes   Last attempt to quit: 07/06/2018   Years since quitting: 0.0  Smokeless  Tobacco Never Used   Counseling given: Not Answered  Smoking assessment and cessation counseling  Patient currently smoking: stopped on 07/06/18 I have advised the patient to quit/stop smoking as soon as possible due to high risk for multiple medical problems.  It will also be very difficult for [place] to manage patient's  respiratory symptoms and status if we continue to expose her lungs to a known irritant.  We do not advise e-cigarettes as a form of stopping smoking.  Patient has quit smoking.   I have advised the patient that we can assist and have options of nicotine replacement therapy, provided smoking cessation education today, provided smoking cessation counseling, and provided cessation resources. Willing to use smoking cessation aids. Open to using nicotine replacement.   Follow-up next office visit office visit for assessment of smoking cessation.    Smoking cessation counseling advised for: 6 min    Outpatient Encounter Medications as of 07/13/2018  Medication Sig   albuterol (PROVENTIL HFA;VENTOLIN HFA) 108 (90 Base) MCG/ACT inhaler Inhale 1-2 puffs into the lungs every 6 (six) hours as needed for wheezing or  shortness of breath.   BIKTARVY 50-200-25 MG TABS tablet TAKE 1 TABLET BY MOUTH DAILY (Patient taking differently: Take 1 tablet by mouth daily. )   cloNIDine (CATAPRES) 0.2 MG tablet Take 1 tablet (0.2 mg total) by mouth 2 (two) times daily.   oxyCODONE-acetaminophen (PERCOCET) 5-325 MG tablet Take 1 tablet by mouth every 6 (six) hours as needed for up to 5 days for severe pain.   nicotine (NICODERM CQ) 21 mg/24hr patch Place 1 patch (21 mg total) onto the skin daily.   nicotine polacrilex (COMMIT) 4 MG lozenge Take 1 lozenge (4 mg total) by mouth as needed for smoking cessation.   No facility-administered encounter medications on file as of 07/13/2018.      Review of Systems  Review of Systems  Constitutional: Positive for fatigue. Negative for activity change,  chills, fever and unexpected weight change.  HENT: Negative for postnasal drip, rhinorrhea, sinus pressure, sinus pain, sneezing and sore throat.   Eyes: Negative.   Respiratory: Positive for shortness of breath. Negative for cough and wheezing.   Cardiovascular: Negative for chest pain and palpitations.  Gastrointestinal: Negative for diarrhea, nausea and vomiting.  Endocrine: Negative.   Musculoskeletal: Negative.   Skin: Negative.   Neurological: Negative for dizziness and headaches.  Psychiatric/Behavioral: Negative.  Negative for dysphoric mood. The patient is not nervous/anxious.   All other systems reviewed and are negative.    Physical Exam  BP 100/74 (BP Location: Left Arm, Cuff Size: Normal)    Pulse 77    Ht 5\' 11"  (1.803 m)    Wt 151 lb 13.9 oz (68.9 kg)    SpO2 94%    BMI 21.18 kg/m   Wt Readings from Last 5 Encounters:  07/13/18 151 lb 13.9 oz (68.9 kg)  06/30/18 151 lb 3.8 oz (68.6 kg)  06/29/18 156 lb (70.8 kg)  06/24/18 160 lb 12.8 oz (72.9 kg)  06/21/18 157 lb 8 oz (71.4 kg)   Physical Exam  Constitutional: He is oriented to person, place, and time and well-developed, well-nourished, and in no distress. Vital signs are normal. No distress.  Chronically ill male  HENT:  Head: Normocephalic and atraumatic.  Right Ear: Hearing, tympanic membrane, external ear and ear canal normal.  Left Ear: Hearing, tympanic membrane, external ear and ear canal normal.  Nose: Nose normal. Right sinus exhibits no maxillary sinus tenderness and no frontal sinus tenderness. Left sinus exhibits no maxillary sinus tenderness and no frontal sinus tenderness.  Mouth/Throat: Uvula is midline and oropharynx is clear and moist. No oropharyngeal exudate.  Eyes: Pupils are equal, round, and reactive to light.  Neck: Normal range of motion. Neck supple.  Cardiovascular: Normal rate, regular rhythm and normal heart sounds.  Pulmonary/Chest: Effort normal and breath sounds normal. No accessory  muscle usage. No respiratory distress. He has no decreased breath sounds. He has no wheezes. He has no rhonchi. He has no rales.  Musculoskeletal: Normal range of motion.        General: No edema.  Lymphadenopathy:    He has no cervical adenopathy.  Neurological: He is alert and oriented to person, place, and time. Gait normal.  Skin: Skin is warm and dry. He is not diaphoretic. No erythema.     Psychiatric: Mood, memory, affect and judgment normal.  Nursing note and vitals reviewed.     Lab Results:  CBC    Component Value Date/Time   WBC 10.9 (H) 07/05/2018 0747   RBC 3.16 (L) 07/05/2018 9622  HGB 11.4 (L) 07/05/2018 0747   HGB 13.2 06/18/2018 1114   HCT 32.9 (L) 07/05/2018 0747   PLT 157 07/05/2018 0747   PLT 108 (L) 06/18/2018 1114   MCV 104.1 (H) 07/05/2018 0747   MCH 36.1 (H) 07/05/2018 0747   MCHC 34.7 07/05/2018 0747   RDW 11.3 (L) 07/05/2018 0747   LYMPHSABS 0.9 06/29/2018 0608   MONOABS 0.5 06/29/2018 0608   EOSABS 0.0 06/29/2018 0608   BASOSABS 0.0 06/29/2018 0608    BMET    Component Value Date/Time   NA 134 (L) 07/07/2018 0228   K 3.9 07/07/2018 0228   CL 99 07/07/2018 0228   CO2 29 07/07/2018 0228   GLUCOSE 98 07/07/2018 0228   BUN 5 (L) 07/07/2018 0228   CREATININE 0.87 07/07/2018 0228   CREATININE 1.11 06/18/2018 1114   CREATININE 1.06 05/07/2018 0858   CALCIUM 8.8 (L) 07/07/2018 0228   GFRNONAA >60 07/07/2018 0228   GFRNONAA >60 06/18/2018 1114   GFRNONAA 81 12/02/2017 1034   GFRAA >60 07/07/2018 0228   GFRAA >60 06/18/2018 1114   GFRAA 94 12/02/2017 1034    BNP No results found for: BNP  ProBNP No results found for: PROBNP    Assessment & Plan:     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[place]) Assessment: March/2020 surgical pathology report showing necrotizing granulomatous inflammation associated fibrosis no evidence of malignancy  Plan: Review information with infectious disease at 07/19/2018 office visit Follow-up CT in 3 months  Human  immunodeficiency virus (HIV) disease ([place]) Assessment: Currently managed on Biktarvy  Plan: Continue follow-up with infectious disease  Cigarette smoker Assessment: Recently stop smoking on 07/06/2018 Not currently using any smoking cessation aids  Plan: Can start 21 mg nicotine patches Can start 4 mg nicotine lozenges Continue to not smoke   Lung nodule Assessment: Status post wedge resection  Plan: CT without contrast in 3 months     Lauraine Rinne, NP 07/13/2018   This appointment was 30 min long with over 50% of the time in direct face-to-face patient care, assessment, plan of care, and follow-up.

## 2018-07-14 ENCOUNTER — Telehealth: Payer: Self-pay | Admitting: Hematology

## 2018-07-14 ENCOUNTER — Other Ambulatory Visit: Payer: Self-pay

## 2018-07-14 LAB — CULTURE, FUNGUS WITHOUT SMEAR

## 2018-07-14 NOTE — Telephone Encounter (Signed)
Per 3/24 sch message - r/s appt - pt aware of appt date and time

## 2018-07-15 ENCOUNTER — Ambulatory Visit
Admission: RE | Admit: 2018-07-15 | Discharge: 2018-07-15 | Disposition: A | Discharge status: Home / Self Care | Payer: Self-pay | Source: Ambulatory Visit | Attending: Cardiothoracic Surgery | Admitting: Cardiothoracic Surgery

## 2018-07-15 ENCOUNTER — Ambulatory Visit (INDEPENDENT_AMBULATORY_CARE_PROVIDER_SITE_OTHER): Payer: Self-pay | Admitting: Cardiothoracic Surgery

## 2018-07-15 ENCOUNTER — Ambulatory Visit: Payer: Self-pay

## 2018-07-15 VITALS — BP 130/95 | HR 67 | Temp 97.6°F | Resp 20 | Ht 71.0 in | Wt 154.0 lb

## 2018-07-15 DIAGNOSIS — R918 Other nonspecific abnormal finding of lung field: Secondary | ICD-10-CM

## 2018-07-15 DIAGNOSIS — R911 Solitary pulmonary nodule: Secondary | ICD-10-CM

## 2018-07-15 DIAGNOSIS — J95811 Postprocedural pneumothorax: Secondary | ICD-10-CM

## 2018-07-15 DIAGNOSIS — Z09 Encounter for follow-up examination after completed treatment for conditions other than malignant neoplasm: Secondary | ICD-10-CM

## 2018-07-15 DIAGNOSIS — R05 Cough: Secondary | ICD-10-CM | POA: Diagnosis not present

## 2018-07-15 MED ORDER — OXYCODONE HCL 5 MG PO TABS: 5.0000 mg | ORAL_TABLET | ORAL | 0 refills | Status: DC | PRN

## 2018-07-15 NOTE — Progress Notes (Signed)
[address]       [place] [address]             [phone]      [place] Record #[id number] Date of Birth: [birth year]  Referring: Marshell Garfinkel, MD Primary Care: [facility] Primary Cardiologist: No primary care provider on file.   Chief Complaint:   POST OP FOLLOW UP OPERATIVE REPORT DATE OF PROCEDURE:  06/23/2018 PREOPERATIVE DIAGNOSIS:  Right upper lobe lung mass. POSTOPERATIVE DIAGNOSIS:  Right upper lobe lung mass, benign by frozen section. PROCEDURE PERFORMED:  Bronchoscopy, right video-assisted thoracoscopy, resection and stapling of large apical bleb and resection of right upper lobe lung mass with wedge resection and lymph node dissection with intercostal nerve block. SURGEON:  Lanelle Bal, MD  History of Present Illness:     Overall patient feels better, still needing some pain medication for pain control, but this has decreased.  He denies shortness of breath.  He has had no cough no fever or chills     Past Medical History:  Diagnosis Date   Anal warts    COPD (chronic obstructive pulmonary disease) ([place])    Hemorrhoids 02/25/2015   History of tuberculin skin testing within last year 2014   HIV (human immunodeficiency virus infection) ([place])    Pneumonia 2007   Pneumothorax 07/01/2018   LEFT     Social History   Tobacco Use  Smoking Status Former Smoker   Packs/day: 1.00   Years: 27.00   Pack years: 27.00   Types: Cigarettes   Last attempt to quit: 07/06/2018   Years since quitting: 0.0  Smokeless Tobacco Never Used    Social History   Substance and Sexual Activity  Alcohol Use Yes   Alcohol/week: 0.0 standard drinks   Comment: less than 40 oz a week     Allergies  Allergen Reactions   Nsaids Other (See Comments)    DRUG INTERACTION WITH HIV MEDS   Sulfa Antibiotics Itching    Current Outpatient Medications  Medication Sig Dispense  Refill   albuterol (PROVENTIL HFA;VENTOLIN HFA) 108 (90 Base) MCG/ACT inhaler Inhale 1-2 puffs into the lungs every 6 (six) hours as needed for wheezing or shortness of breath.     BIKTARVY 50-200-25 MG TABS tablet TAKE 1 TABLET BY MOUTH DAILY (Patient taking differently: Take 1 tablet by mouth daily. ) 30 tablet 5   cloNIDine (CATAPRES) 0.2 MG tablet Take 1 tablet (0.2 mg total) by mouth 2 (two) times daily. 60 tablet 1   nicotine (NICODERM CQ) 21 mg/24hr patch Place 1 patch (21 mg total) onto the skin daily. (Patient not taking: Reported on 07/15/2018) 28 patch 0   nicotine polacrilex (COMMIT) 4 MG lozenge Take 1 lozenge (4 mg total) by mouth as needed for smoking cessation. (Patient not taking: Reported on 07/15/2018) 120 tablet 3   No current facility-administered medications for this visit.        Physical Exam: BP (!) 130/95    Pulse 67    Temp 97.6 F (36.4 C) (Oral)    Resp 20    Ht 5\' 11"  (1.803 m)    Wt 154 lb (69.9 kg)    SpO2 100% Comment: RA   BMI 21.48 kg/m   General appearance: alert and cooperative Neurologic: intact Heart: regular rate and rhythm, S1, S2 normal, no murmur, click, rub or gallop Lungs: clear to auscultation bilaterally Abdomen: soft, non-tender; bowel sounds normal; no  masses,  no organomegaly Extremities: extremities normal, atraumatic, no cyanosis or edema and Homans sign is negative, no sign of DVT Wound: Incisions are healing well chest tube sutures have already been removed   Diagnostic Studies & Laboratory data:     Recent Radiology Findings:   Dg Chest 2 View  Result Date: 07/15/2018 CLINICAL DATA:  Right upper lobe mass. Status post recent VATS procedure. Cough EXAM: CHEST - 2 VIEW COMPARISON:  July 09, 2018 FINDINGS: The right upper lobe mass is ill-defined and less apparent than on recent chest radiograph. There is a moderate shoulder loculated right pleural effusion with areas of atelectasis in the right base. Left lung is clear. Heart  size and pulmonary vascularity are within normal limits. No adenopathy appreciable by radiography. No bone lesions. Port-A-Cath tip is at the cavoatrial junction. No pneumothorax. IMPRESSION: Right upper lobe mass is present but less well delineated compared to recent prior examination. Stable rather minimal right apical pneumothorax. Postoperative changes noted in the right upper lobe. There is persistent pleural effusion on the right with areas of atelectasis in the right base. Left lung is clear. Stable cardiac silhouette. Electronically Signed   By: Lowella Grip III M.D.   On: 07/15/2018 09:34  I have independently reviewed the above radiology studies  and reviewed the findings with the patient. Of above reading by x-rays incorrect the patient does not have a right upper lobe mass pneumothorax that was apically situated is much improved   Recent Lab Findings: Lab Results  Component Value Date   WBC 10.9 (H) 07/05/2018   HGB 11.4 (L) 07/05/2018   HCT 32.9 (L) 07/05/2018   PLT 157 07/05/2018   GLUCOSE 98 07/07/2018   CHOL 186 05/07/2018   TRIG 234 (H) 05/07/2018   HDL 63 05/07/2018   LDLCALC 91 05/07/2018   ALT 170 (H) 07/02/2018   AST 97 (H) 07/02/2018   NA 134 (L) 07/07/2018   K 3.9 07/07/2018   CL 99 07/07/2018   CREATININE 0.87 07/07/2018   BUN 5 (L) 07/07/2018   CO2 29 07/07/2018   INR 1.0 07/05/2018      Assessment / Plan:   Patient stable following resection of apical blebs and small apical lung mass, that was highly suspicious for malignancy but after resection was found to be inflammatory granuloma. Wounds are healing adequately Patient was given 30 tablets of oxycodone and was instructed to have these last until his next appointment in 4 weeks.        Grace Isaac MD      [address] [place] [address] Office [phone]   Beeper [phone]  07/15/2018 10:04 AM

## 2018-07-19 ENCOUNTER — Inpatient Hospital Stay: Payer: Self-pay

## 2018-07-19 ENCOUNTER — Inpatient Hospital Stay: Payer: Self-pay | Admitting: Hematology

## 2018-07-20 LAB — HIV-1 RNA, PCR (GRAPH) RFX/GENO EDI
HIV-1 RNA BY PCR: 30 copies/mL
HIV-1 RNA Quant, Log: 1.477 log10copy/mL

## 2018-07-22 ENCOUNTER — Ambulatory Visit: Payer: Self-pay | Admitting: Cardiothoracic Surgery

## 2018-07-29 ENCOUNTER — Other Ambulatory Visit: Payer: Self-pay

## 2018-07-29 ENCOUNTER — Ambulatory Visit
Admission: RE | Admit: 2018-07-29 | Discharge: 2018-07-29 | Disposition: A | Discharge status: Home / Self Care | Payer: Self-pay | Source: Ambulatory Visit | Attending: Cardiothoracic Surgery | Admitting: Cardiothoracic Surgery

## 2018-07-29 DIAGNOSIS — R042 Hemoptysis: Secondary | ICD-10-CM | POA: Diagnosis not present

## 2018-07-29 NOTE — Progress Notes (Signed)
Repeat CXR Monday 08/02/18 per Dr Servando Snare. C/O blood in sputum in AM. No fevers. Patient aware

## 2018-08-02 ENCOUNTER — Ambulatory Visit
Admission: RE | Admit: 2018-08-02 | Discharge: 2018-08-02 | Disposition: A | Discharge status: Home / Self Care | Payer: Self-pay | Source: Ambulatory Visit | Attending: Cardiothoracic Surgery | Admitting: Cardiothoracic Surgery

## 2018-08-02 ENCOUNTER — Other Ambulatory Visit: Payer: Self-pay

## 2018-08-02 DIAGNOSIS — R079 Chest pain, unspecified: Secondary | ICD-10-CM | POA: Diagnosis not present

## 2018-08-02 DIAGNOSIS — R042 Hemoptysis: Secondary | ICD-10-CM

## 2018-08-04 NOTE — Progress Notes (Signed)
[address]       [place] [address]             [phone]   Telehealth Visit     Virtual Visit via Telephone Note   This visit type was conducted due to national recommendations for restrictions regarding the COVID-19 Pandemic (e.g. social distancing) in an effort to limit this patient's exposure and mitigate transmission in our community.  Due to his co-morbid illnesses, this patient is at least at moderate risk for complications without adequate follow up.  This format is felt to be most appropriate for this patient at this time.  The patient did not have access to video technology/had technical difficulties with video requiring transitioning to audio format only (telephone).  All issues noted in this document were discussed and addressed.  No physical exam could be performed with this format.  Please refer to the patient's chart for his  consent to telehealth for [facility].   This visit type was conducted due to national recommendations for restrictions regarding the COVID-19 Pandemic (e.g. social distancing).  This format is felt to be most appropriate for this patient at this time.  All issues noted in this document were discussed and addressed.  No physical exam was performed (except for noted visual exam findings with Video Visits).     Jeremiah [name] Record #[id number] Date of Birth: Jul 09, 1970  Referring: [facility] Primary Care: [facility] Primary Cardiologist: No primary care provider on file.   Chief Complaint:   POST OP FOLLOW UP OPERATIVE REPORT DATE OF PROCEDURE:  06/23/2018 PREOPERATIVE DIAGNOSIS:  Right upper lobe lung mass. POSTOPERATIVE DIAGNOSIS:  Right upper lobe lung mass, benign by frozen section. PROCEDURE PERFORMED:  Bronchoscopy, right video-assisted thoracoscopy, resection and stapling of large apical bleb and resection of right upper lobe lung mass with wedge  resection and lymph node dissection with intercostal nerve block. SURGEON:  Lanelle Bal, MD  History of Present Illness:     I contacted Jeremiah [name] remotely due to the limitations of the current COVID pandemic on 08/05/2018  at  9:56 AM  verifying that I was speaking to Jeremiah [name] whose birthday is 1970/11/17.   I discussed limitations of the evaluation and management  of patients remotely without  the benefit of physical exam.  The patient was agreeable with proceeding with a remote/ not face to face visit.   The patient notes that he is feeling better.  He is increasing his activity.  He denies any fever or chills.  Last week he had some minor hemoptysis that is now completely resolved follow-up chest x-rays were done 6 days ago and again 3 days ago and were reviewed with the patient.  He was concerned that it had some weight loss but he is taking a p.o. diet well.  He notes that he is being careful about social distancing.       Past Medical History:  Diagnosis Date  . Anal warts   . COPD (chronic obstructive pulmonary disease) ([place])   . Hemorrhoids 02/25/2015  . History of tuberculin skin testing within last year 2014  . HIV (human immunodeficiency virus infection) ([place])   . Pneumonia 2007  . Pneumothorax 07/01/2018   LEFT     Social History   Tobacco Use  Smoking Status Former Smoker  . Packs/day: 1.00  . Years: 27.00  . Pack years: 27.00  . Types:  Cigarettes  . Last attempt to quit: 07/06/2018  . Years since quitting: 0.0  Smokeless Tobacco Never Used    Social History   Substance and Sexual Activity  Alcohol Use Yes  . Alcohol/week: 0.0 standard drinks   Comment: less than 40 oz a week     Allergies  Allergen Reactions  . Nsaids Other (See Comments)    DRUG INTERACTION WITH HIV MEDS  . Sulfa Antibiotics Itching    Current Outpatient Medications  Medication Sig Dispense Refill  . albuterol (PROVENTIL HFA;VENTOLIN HFA) 108 (90 Base) MCG/ACT  inhaler Inhale 1-2 puffs into the lungs every 6 (six) hours as needed for wheezing or shortness of breath.    Marland Kitchen BIKTARVY 50-200-25 MG TABS tablet TAKE 1 TABLET BY MOUTH DAILY (Patient taking differently: Take 1 tablet by mouth daily. ) 30 tablet 5  . cloNIDine (CATAPRES) 0.2 MG tablet Take 1 tablet (0.2 mg total) by mouth 2 (two) times daily. 60 tablet 1   No current facility-administered medications for this visit.        Physical Exam: There were no vitals taken for this visit. No physical exam as this was a telephone visit   Diagnostic Studies & Laboratory data:     Recent Radiology Findings:    Dg Chest 2 View  Result Date: 08/02/2018 CLINICAL DATA:  Hemoptysis.  Right-sided chest pain. EXAM: CHEST - 2 VIEW COMPARISON:  07/29/2018, 07/15/2018 and 07/08/2018 FINDINGS: Port-A-Cath in place, unchanged. Ill-defined cavitary lesion in the right middle lobe appears slightly less prominent. Postsurgical scarring in the right lung apex. Tenting of the lateral aspect of the right hemidiaphragm, unchanged. Left lung is clear. Heart size and vascularity are normal. No effusions. No bone abnormality. IMPRESSION: The ill-defined cavitary lesion in the right middle lobe slightly less prominent than on the prior exam. No other change. Electronically Signed   By: Lorriane Shire M.D.   On: 08/02/2018 11:06     I have independently reviewed the above radiology studies  and reviewed the findings with the patient.   Recent Lab Findings: Lab Results  Component Value Date   WBC 10.9 (H) 07/05/2018   HGB 11.4 (L) 07/05/2018   HCT 32.9 (L) 07/05/2018   PLT 157 07/05/2018   GLUCOSE 98 07/07/2018   CHOL 186 05/07/2018   TRIG 234 (H) 05/07/2018   HDL 63 05/07/2018   LDLCALC 91 05/07/2018   ALT 170 (H) 07/02/2018   AST 97 (H) 07/02/2018   NA 134 (L) 07/07/2018   K 3.9 07/07/2018   CL 99 07/07/2018   CREATININE 0.87 07/07/2018   BUN 5 (L) 07/07/2018   CO2 29 07/07/2018   INR 1.0 07/05/2018       Assessment / Plan:   Patient stable following resection of apical blebs and small apical lung mass, that was highly suspicious for malignancy but after resection was found to be inflammatory granuloma. Wounds are healing adequately Patient is making adequate progress after surgery We will plan to see the patient back in 1 month with a follow-up CT of the chest        Grace Isaac MD      [address] [place] [address] Office [phone]   Beeper [phone]  08/05/2018 9:56 AM

## 2018-08-05 ENCOUNTER — Other Ambulatory Visit: Payer: Self-pay

## 2018-08-05 ENCOUNTER — Telehealth (INDEPENDENT_AMBULATORY_CARE_PROVIDER_SITE_OTHER): Payer: Self-pay | Admitting: Cardiothoracic Surgery

## 2018-08-05 ENCOUNTER — Other Ambulatory Visit: Payer: Self-pay | Admitting: *Deleted

## 2018-08-05 DIAGNOSIS — R911 Solitary pulmonary nodule: Secondary | ICD-10-CM

## 2018-08-05 DIAGNOSIS — Z09 Encounter for follow-up examination after completed treatment for conditions other than malignant neoplasm: Secondary | ICD-10-CM

## 2018-08-05 LAB — ACID FAST CULTURE WITH REFLEXED SENSITIVITIES (MYCOBACTERIA): Acid Fast Culture: NEGATIVE

## 2018-08-05 NOTE — Progress Notes (Unsigned)
t

## 2018-08-05 NOTE — Progress Notes (Signed)
Jeremiah [name] is requesting phone call telephone visit today. He is C/O some chest tightness and random shortness of breath. He does not have any more blood in his sputum but is concerned about his weight loss. Review all medications with him. He is requesting BP med refill if he should continue.

## 2018-08-15 ENCOUNTER — Emergency Department (HOSPITAL_COMMUNITY)
Admission: EM | Admit: 2018-08-15 | Discharge: 2018-08-16 | Disposition: A | Discharge status: Home / Self Care | Payer: Medicaid Other | Attending: Emergency Medicine | Admitting: Emergency Medicine

## 2018-08-15 ENCOUNTER — Encounter (HOSPITAL_COMMUNITY): Payer: Self-pay | Admitting: *Deleted

## 2018-08-15 ENCOUNTER — Other Ambulatory Visit: Payer: Self-pay

## 2018-08-15 DIAGNOSIS — R0902 Hypoxemia: Secondary | ICD-10-CM | POA: Diagnosis not present

## 2018-08-15 DIAGNOSIS — R1084 Generalized abdominal pain: Secondary | ICD-10-CM | POA: Diagnosis not present

## 2018-08-15 DIAGNOSIS — Z87891 Personal history of nicotine dependence: Secondary | ICD-10-CM | POA: Diagnosis not present

## 2018-08-15 DIAGNOSIS — J449 Chronic obstructive pulmonary disease, unspecified: Secondary | ICD-10-CM | POA: Insufficient documentation

## 2018-08-15 DIAGNOSIS — R52 Pain, unspecified: Secondary | ICD-10-CM | POA: Diagnosis not present

## 2018-08-15 DIAGNOSIS — R1033 Periumbilical pain: Secondary | ICD-10-CM | POA: Insufficient documentation

## 2018-08-15 DIAGNOSIS — Z79899 Other long term (current) drug therapy: Secondary | ICD-10-CM | POA: Insufficient documentation

## 2018-08-15 DIAGNOSIS — B20 Human immunodeficiency virus [HIV] disease: Secondary | ICD-10-CM | POA: Diagnosis not present

## 2018-08-15 DIAGNOSIS — R109 Unspecified abdominal pain: Secondary | ICD-10-CM | POA: Diagnosis present

## 2018-08-15 LAB — URINALYSIS, ROUTINE W REFLEX MICROSCOPIC
Bilirubin Urine: NEGATIVE
Glucose, UA: NEGATIVE mg/dL
Hgb urine dipstick: NEGATIVE
Ketones, ur: NEGATIVE mg/dL
Leukocytes,Ua: NEGATIVE
Nitrite: NEGATIVE
Protein, ur: NEGATIVE mg/dL
Specific Gravity, Urine: 1.005 (ref 1.005–1.030)
pH: 6 (ref 5.0–8.0)

## 2018-08-15 LAB — COMPREHENSIVE METABOLIC PANEL
ALT: 27 U/L (ref 0–44)
AST: 30 U/L (ref 15–41)
Albumin: 4.1 g/dL (ref 3.5–5.0)
Alkaline Phosphatase: 57 U/L (ref 38–126)
Anion gap: 5 (ref 5–15)
BUN: 10 mg/dL (ref 6–20)
CO2: 26 mmol/L (ref 22–32)
Calcium: 8.7 mg/dL — ABNORMAL LOW (ref 8.9–10.3)
Chloride: 111 mmol/L (ref 98–111)
Creatinine, Ser: 1.08 mg/dL (ref 0.61–1.24)
GFR calc Af Amer: >60 mL/min (ref >60)
GFR calc non Af Amer: >60 mL/min (ref >60)
Glucose, Bld: 88 mg/dL (ref 70–99)
Potassium: 3.8 mmol/L (ref 3.5–5.1)
Sodium: 142 mmol/L (ref 135–145)
Total Bilirubin: 0.9 mg/dL (ref 0.3–1.2)
Total Protein: 7.2 g/dL (ref 6.5–8.1)

## 2018-08-15 LAB — CBC
HCT: 39.6 % (ref 39.0–52.0)
Hemoglobin: 13.5 g/dL (ref 13.0–17.0)
MCH: 36.2 pg — ABNORMAL HIGH (ref 26.0–34.0)
MCHC: 34.1 g/dL (ref 30.0–36.0)
MCV: 106.2 fL — ABNORMAL HIGH (ref 80.0–100.0)
Platelets: 163 10*3/uL (ref 150–400)
RBC: 3.73 MIL/uL — ABNORMAL LOW (ref 4.22–5.81)
RDW: 12.5 % (ref 11.5–15.5)
WBC: 3.1 10*3/uL — ABNORMAL LOW (ref 4.0–10.5)
nRBC: 0 % (ref 0.0–0.2)

## 2018-08-15 LAB — LIPASE, BLOOD: Lipase: 39 U/L (ref 11–51)

## 2018-08-15 MED ORDER — SODIUM CHLORIDE 0.9% FLUSH: 3.0000 mL | Freq: Once | INTRAVENOUS | Status: DC

## 2018-08-15 MED ORDER — LIDOCAINE VISCOUS HCL 2 % MT SOLN
15.0000 mL | Freq: Once | OROMUCOSAL | Status: AC
  Administered 2018-08-15: 15 mL via ORAL
  Filled 2018-08-15: qty 15

## 2018-08-15 MED ORDER — METOCLOPRAMIDE HCL 10 MG PO TABS: 10.0000 mg | ORAL_TABLET | Freq: Four times a day (QID) | ORAL | 0 refills | Status: DC

## 2018-08-15 MED ORDER — METOCLOPRAMIDE HCL 10 MG PO TABS
10.0000 mg | ORAL_TABLET | Freq: Once | ORAL | Status: AC
  Administered 2018-08-16: 10 mg via ORAL
  Filled 2018-08-15: qty 1

## 2018-08-15 MED ORDER — ALUM & MAG HYDROXIDE-SIMETH 200-200-20 MG/5ML PO SUSP
30.0000 mL | Freq: Once | ORAL | Status: AC
  Administered 2018-08-15: 30 mL via ORAL
  Filled 2018-08-15: qty 30

## 2018-08-15 NOTE — ED Notes (Signed)
Urine culture sent to the lab. 

## 2018-08-15 NOTE — ED Provider Notes (Signed)
[facility] DEPT Provider Note   CSN: [id number] Arrival date & time: 08/15/18  2052    History   Chief Complaint Chief Complaint  Patient presents with  . Abdominal Pain    HPI Jeremiah [name] is a 48 y.o. male.     Patient with history of HIV (CD4 260, RNA 1.4 on 07/17/18), COPD, right upper lobe mass with recent resection (06/23/2018) presents with periumbilical abdominal pain that started about 2 hours prior to arrival. He notes the pain started shortly after eating his dinner. No nausea, vomiting, fever. Last bowel movement was this morning per his usual habit. He has been passing gas throughout the day. Last meal was around noon time without any symptoms of pain. He took Rolaids prior to calling EMS without relief. He states the pain location and character remind him of symptoms of previous UTI. No penile discharge, testicular pain or scrotal swelling.   The history is provided by the patient. No language interpreter was used.  Abdominal Pain  Associated symptoms: no chest pain, no constipation, no cough, no diarrhea, no dysuria, no fever, no nausea, no shortness of breath and no vomiting     Past Medical History:  Diagnosis Date  . Anal warts   . COPD (chronic obstructive pulmonary disease) ([place])   . Hemorrhoids 02/25/2015  . History of tuberculin skin testing within last year 2014  . HIV (human immunodeficiency virus infection) ([place])   . Pneumonia 2007  . Pneumothorax 07/01/2018   LEFT    Patient Active Problem List   Diagnosis Date Noted  . Pneumothorax on left 06/30/2018  . Lung nodule 06/23/2018  . 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[place]) 01/06/2018  . Anal cancer ([place]) 11/07/2015  . Human immunodeficiency virus (HIV) disease ([place]) 05/19/2013  . Dyslipidemia 05/19/2013  . Cigarette smoker 05/19/2013  . Depression 05/19/2013  . Dental caries 05/19/2013  . Hx of unilateral orchiectomy 05/19/2013  . Latent syphilis 05/06/2013  . Anal warts  05/06/2013  . History of chlamydia 05/06/2013    Past Surgical History:  Procedure Laterality Date  . CHEST TUBE INSERTION ([place] HX) Left 06/30/2018  . HERNIA REPAIR  1991   Abdominal   . unilateral orchiectomy    . VIDEO ASSISTED THORACOSCOPY (VATS)/WEDGE RESECTION Right 06/23/2018   Procedure: VIDEO ASSISTED THORACOSCOPY (VATS)/LUNG RESECTION, stapling and dissection of apical bleb, wedge resection of right upper lobe mass, lymph node dissection, intercostal nerve block;  Surgeon: Grace Isaac, MD;  Location: [facility];  Service: Thoracic;  Laterality: Right;  Marland Kitchen VIDEO BRONCHOSCOPY N/A 06/23/2018   Procedure: VIDEO BRONCHOSCOPY;  Surgeon: Grace Isaac, MD;  Location: [facility];  Service: Thoracic;  Laterality: N/A;        Home Medications    Prior to Admission medications   Medication Sig Start Date End Date Taking? Authorizing Provider  albuterol (PROVENTIL HFA;VENTOLIN HFA) 108 (90 Base) MCG/ACT inhaler Inhale 1-2 puffs into the lungs every 6 (six) hours as needed for wheezing or shortness of breath.    [provider]  BIKTARVY 50-200-25 MG TABS tablet TAKE 1 TABLET BY MOUTH DAILY Patient taking differently: Take 1 tablet by mouth daily.  06/11/18   Michel Bickers, MD  cloNIDine (CATAPRES) 0.2 MG tablet Take 1 tablet (0.2 mg total) by mouth 2 (two) times daily. 06/28/18   Nani Skillern, PA-C    Family History Family History  Problem Relation Age of Onset  . Diabetes Mother   . Hypertension Mother   . Cancer Mother  breast  . Diabetes Father   . Hypertension Father     Social History Social History   Tobacco Use  . Smoking status: Former Smoker    Packs/day: 1.00    Years: 27.00    Pack years: 27.00    Types: Cigarettes    Last attempt to quit: 07/06/2018    Years since quitting: 0.1  . Smokeless tobacco: Never Used  Substance Use Topics  . Alcohol use: Yes    Alcohol/week: 0.0 standard drinks    Comment: less than 40 oz a week  . Drug  use: No    Comment: Past history of crack cocaine  use      Allergies   Nsaids and Sulfa antibiotics   Review of Systems Review of Systems  Constitutional: Negative for fever.  HENT: Negative.   Respiratory: Negative.  Negative for cough and shortness of breath.   Cardiovascular: Negative for chest pain.  Gastrointestinal: Positive for abdominal pain. Negative for constipation, diarrhea, nausea and vomiting.  Genitourinary: Negative for dysuria, scrotal swelling and testicular pain.  Musculoskeletal: Negative for myalgias.  Skin: Negative for rash.  Neurological: Negative for weakness.     Physical Exam Updated Vital Signs BP 125/79 (BP Location: Right Arm)   Pulse 71   Temp 99.5 F (37.5 C) (Oral)   Resp 16   Ht 5\' 11"  (1.803 m)   Wt 69.6 kg   SpO2 100%   BMI 21.41 kg/m   Physical Exam Vitals signs and nursing note reviewed.  Constitutional:      Appearance: He is well-developed.  Neck:     Musculoskeletal: Normal range of motion.  Cardiovascular:     Rate and Rhythm: Normal rate.  Pulmonary:     Effort: Pulmonary effort is normal.  Abdominal:     General: There is no distension.     Palpations: Abdomen is soft. There is no mass.     Tenderness: There is abdominal tenderness (periumbilical tenderness). There is no guarding.  Genitourinary:    Penis: Normal.      Comments: Uncircumcised penis without meatal discharge. No testicular tenderness.  Musculoskeletal: Normal range of motion.  Skin:    General: Skin is warm and dry.  Neurological:     Mental Status: He is alert and oriented to person, place, and time.      ED Treatments / Results  Labs (all labs ordered are listed, but only abnormal results are displayed) Labs Reviewed  CBC - Abnormal; Notable for the following components:      Result Value   WBC 3.1 (*)    RBC 3.73 (*)    MCV 106.2 (*)    MCH 36.2 (*)    All other components within normal limits  URINALYSIS, ROUTINE W REFLEX  MICROSCOPIC - Abnormal; Notable for the following components:   Color, Urine STRAW (*)    All other components within normal limits  LIPASE, BLOOD  COMPREHENSIVE METABOLIC PANEL   Results for orders placed or performed during the hospital encounter of 08/15/18  Lipase, blood  Result Value Ref Range   Lipase 39 11 - 51 U/L  Comprehensive metabolic panel  Result Value Ref Range   Sodium 142 135 - 145 mmol/L   Potassium 3.8 3.5 - 5.1 mmol/L   Chloride 111 98 - 111 mmol/L   CO2 26 22 - 32 mmol/L   Glucose, Bld 88 70 - 99 mg/dL   BUN 10 6 - 20 mg/dL   Creatinine, Ser 1.08 0.61 -  1.24 mg/dL   Calcium 8.7 (L) 8.9 - 10.3 mg/dL   Total Protein 7.2 6.5 - 8.1 g/dL   Albumin 4.1 3.5 - 5.0 g/dL   AST 30 15 - 41 U/L   ALT 27 0 - 44 U/L   Alkaline Phosphatase 57 38 - 126 U/L   Total Bilirubin 0.9 0.3 - 1.2 mg/dL   GFR calc non Af Amer >60 >60 mL/min   GFR calc Af Amer >60 >60 mL/min   Anion gap 5 5 - 15  CBC  Result Value Ref Range   WBC 3.1 (L) 4.0 - 10.5 K/uL   RBC 3.73 (L) 4.22 - 5.81 MIL/uL   Hemoglobin 13.5 13.0 - 17.0 g/dL   HCT 39.6 39.0 - 52.0 %   MCV 106.2 (H) 80.0 - 100.0 fL   MCH 36.2 (H) 26.0 - 34.0 pg   MCHC 34.1 30.0 - 36.0 g/dL   RDW 12.5 11.5 - 15.5 %   Platelets 163 150 - 400 K/uL   nRBC 0.0 0.0 - 0.2 %  Urinalysis, Routine w reflex microscopic  Result Value Ref Range   Color, Urine STRAW (A) YELLOW   APPearance CLEAR CLEAR   Specific Gravity, Urine 1.005 1.005 - 1.030   pH 6.0 5.0 - 8.0   Glucose, UA NEGATIVE NEGATIVE mg/dL   Hgb urine dipstick NEGATIVE NEGATIVE   Bilirubin Urine NEGATIVE NEGATIVE   Ketones, ur NEGATIVE NEGATIVE mg/dL   Protein, ur NEGATIVE NEGATIVE mg/dL   Nitrite NEGATIVE NEGATIVE   Leukocytes,Ua NEGATIVE NEGATIVE    EKG None  Radiology No results found.  Procedures Procedures (including critical care time)  Medications Ordered in ED Medications  sodium chloride flush (NS) 0.9 % injection 3 mL (has no administration in time  range)     Initial Impression / Assessment and Plan / ED Course  I have reviewed the triage vital signs and the nursing notes.  Pertinent labs & imaging results that were available during my care of the patient were reviewed by me and considered in my medical decision making (see chart for details).        Patient to ED with abdominal pain for 2 hours. No fever, diarrhea, constipation, N, V, urinary symptoms.   Patient is well appearing. Does not appear uncomfortable. VSS. He has mild periumbilical tenderness to soft abdomen. No risk factors for obstruction, no fever to suggest infection, labs are essentially normal. GI cocktail provided with minimal relief.   Do not feel imaging would provide benefit. Discussed with Dr. Ronnald Nian. Will discharge home with REglan. Return precautions of fever, severe pain, vomiting, bloody stools discussed specifically with the patient.   Final Clinical Impressions(s) / ED Diagnoses   Final diagnoses:  None   1. Abdominal pain  ED Discharge Orders    None       Charlann Lange, PA-C 08/15/18 2346    Lennice Sites, DO 08/15/18 2350

## 2018-08-15 NOTE — ED Triage Notes (Addendum)
Per [facility], pt c/o abd pain 1.5 hours after eating & just prior to transport, ETOH, recent upper right partial lobectomy 06/23/18.  Pt c/o lower abd pain.

## 2018-08-15 NOTE — Discharge Instructions (Addendum)
Return to ED if you develop severe pain, high fever, bloody stools, vomiting. Take Reglan as directed. Follow up with your doctor as needed for recheck if pain continues.

## 2018-08-17 ENCOUNTER — Telehealth: Payer: Self-pay | Admitting: Internal Medicine

## 2018-08-26 ENCOUNTER — Telehealth: Payer: Self-pay | Admitting: Hematology

## 2018-08-26 NOTE — Telephone Encounter (Signed)
Called regarding upcoming Webex appointment, patient does not have access for Webex and needs this to be a telephone visit. °

## 2018-08-27 NOTE — Progress Notes (Signed)
[name]   [phone] [phone]   Clinic Follow up Note   Patient Care Team: [facility] as PCP - General Michel Bickers, MD as PCP - Infectious Diseases (Infectious Diseases) Charlott Rakes, MD as Consulting Physician (Family Medicine) Grace Isaac, MD as Consulting Physician (Cardiothoracic Surgery)   I connected with Jeremiah [name] on 08/30/2018 at  9:30 AM EDT by telephone visit and verified that I am speaking with the correct person using two identifiers.  I discussed the limitations, risks, security and privacy concerns of performing an evaluation and management service by telephone and the availability of in person appointments. I also discussed with the patient that there may be a patient responsible charge related to this service. The patient expressed understanding and agreed to proceed.   Patient's location:  [facility]rem[name]  Provider's location:  My Office   CHIEF COMPLAINT: F/u anal cancer   CURRENT THERAPY:  Surveillance   INTERVAL HISTORY:  Jeremiah [name] is here for a follow up. He was able to identify himself by birth date. He notes he is doing well post surgery. He notes occasional mild SOB and pain in upper chest which he attributes to COPD. He notes he is able to be active but nothing strenuous things. He does work Part time and able to handle it. He will come [name] tired afterward. He notes he has stopped smoking.  He notes he has loose stool and can sometimes have diarrhea. He denies abdominal pain. He denies any change in weight. He weight 156 pounds.    REVIEW OF SYSTEMS:   Constitutional: Denies fevers, chills or abnormal weight loss Eyes: Denies blurriness of vision Ears, nose, mouth, throat, and face: Denies mucositis or sore throat Respiratory: Denies cough or wheezes (+) Occasional mild SOB and mild upper chest pain  Cardiovascular: Denies palpitation, chest discomfort or lower  extremity swelling Gastrointestinal:  Denies nausea, heartburn (+) occasional diarrhea (2-3 times a day) Skin: Denies abnormal skin rashes Lymphatics: Denies new lymphadenopathy or easy bruising Neurological:Denies numbness, tingling or new weaknesses Behavioral/Psych: Mood is stable, no new changes  All other systems were reviewed with the patient and are negative.  MEDICAL HISTORY:  Past Medical History:  Diagnosis Date  . Anal warts   . COPD (chronic obstructive pulmonary disease) ([place])   . Hemorrhoids 02/25/2015  . History of tuberculin skin testing within last year 2014  . HIV (human immunodeficiency virus infection) ([place])   . Pneumonia 2007  . Pneumothorax 07/01/2018   LEFT    SURGICAL HISTORY: Past Surgical History:  Procedure Laterality Date  . CHEST TUBE INSERTION ([place] HX) Left 06/30/2018  . HERNIA REPAIR  1991   Abdominal   . unilateral orchiectomy    . VIDEO ASSISTED THORACOSCOPY (VATS)/WEDGE RESECTION Right 06/23/2018   Procedure: VIDEO ASSISTED THORACOSCOPY (VATS)/LUNG RESECTION, stapling and dissection of apical bleb, wedge resection of right upper lobe mass, lymph node dissection, intercostal nerve block;  Surgeon: Grace Isaac, MD;  Location: [facility];  Service: Thoracic;  Laterality: Right;  Marland Kitchen VIDEO BRONCHOSCOPY N/A 06/23/2018   Procedure: VIDEO BRONCHOSCOPY;  Surgeon: Grace Isaac, MD;  Location: [facility] OR;  Service: Thoracic;  Laterality: N/A;    I have reviewed the social history and family history with the patient and they are unchanged from previous note.  ALLERGIES:  is allergic to nsaids and sulfa antibiotics.  MEDICATIONS:  Current Outpatient Medications  Medication Sig Dispense Refill  . albuterol (PROVENTIL HFA;VENTOLIN  HFA) 108 (90 Base) MCG/ACT inhaler Inhale 1-2 puffs into the lungs every 6 (six) hours as needed for wheezing or shortness of breath.    Marland Kitchen BIKTARVY 50-200-25 MG TABS tablet TAKE 1 TABLET BY MOUTH DAILY (Patient taking differently:  Take 1 tablet by mouth daily. ) 30 tablet 5  . cloNIDine (CATAPRES) 0.2 MG tablet Take 1 tablet (0.2 mg total) by mouth 2 (two) times daily. 60 tablet 1  . metoCLOPramide (REGLAN) 10 MG tablet Take 1 tablet (10 mg total) by mouth every 6 (six) hours. 30 tablet 0   No current facility-administered medications for this visit.     PHYSICAL EXAMINATION: ECOG PERFORMANCE STATUS: 1 - Symptomatic but completely ambulatory  No vitals taken today, Exam not performed today  LABORATORY DATA:  I have reviewed the data as listed CBC Latest Ref Rng & Units 08/15/2018 07/05/2018 06/30/2018  WBC 4.0 - 10.5 K/uL 3.1(L) 10.9(H) 9.3  Hemoglobin 13.0 - 17.0 g/dL 13.5 11.4(L) 14.0  Hematocrit 39.0 - 52.0 % 39.6 32.9(L) 41.2  Platelets 150 - 400 K/uL 163 157 131(L)     CMP Latest Ref Rng & Units 08/15/2018 07/07/2018 07/02/2018  Glucose 70 - 99 mg/dL 88 98 107(H)  BUN 6 - 20 mg/dL 10 5(L) 10  Creatinine 0.61 - 1.24 mg/dL 1.08 0.87 1.06  Sodium 135 - 145 mmol/L 142 134(L) 135  Potassium 3.5 - 5.1 mmol/L 3.8 3.9 4.2  Chloride 98 - 111 mmol/L 111 99 100  CO2 22 - 32 mmol/L 26 29 27   Calcium 8.9 - 10.3 mg/dL 8.7(L) 8.8(L) 8.9  Total Protein 6.5 - 8.1 g/dL 7.2 - 5.6(L)  Total Bilirubin 0.3 - 1.2 mg/dL 0.9 - 1.6(H)  Alkaline Phos 38 - 126 U/L 57 - 70  AST 15 - 41 U/L 30 - 97(H)  ALT 0 - 44 U/L 27 - 170(H)    VIDEO BRONCHOSCOPY by [name] 06/23/18  Diagnosis 1. Lung, wedge biopsy/resection, Right upper lobe - NECROTIZING GRANULOMATOUS INFLAMMATION WITH ASSOCIATED FIBROSIS. SEE NOTE. - NO EVIDENCE OF MALIGNANCY. 2. Lung, bleb(s) / bullae, Right apical - SUBPLEURAL BULLA. 3. Lung, bleb(s) / bullae, Right apical #2 - SUBPLEURAL BULLA. 4. Lymph node, biopsy, 2R - BENIGN LYMPH NODE. 5. Lymph node, biopsy, 4R - BENIGN LYMPH NODE. 6. Lymph node, biopsy, 10R - BENIGN LYMPH NODE. 7. Lung, bleb(s) / bullae, Right apical #3 - SUBPLEURAL BULLA.   RADIOGRAPHIC STUDIES: I have personally reviewed the  radiological images as listed and agreed with the findings in the report. No results found.   ASSESSMENT & PLAN:  Jeremiah [name] is a 48 y.o. male with   1.History of anal cancer  -Initially diagnosed in November 2018,and was treated with concurrent chemoradiation in [place].  -Latest 05/2018 CT of abdomen and pelvis was negative for recurrence except RUL lung nodule. -He is clinically doing well, with residual mild diarrhea. I encouraged him to use imodium when he has 2 episodes of diarrhea.  -Continue Surveillance. Will repeat CT AP in 05/2019. Will f/u with him every 4 months for the year, then every 6 months.   2.Right upper lobe benign lung nodule, s/p resection -Jeremiah CT chest, abdomen and pelvis from Jeremiah ED visit on 05/22/2018 showed a 1.7 cm nodule in the right upper lobe,and a 2 cm lymph node in the right hilar, highly suspicious for malignancy -Jeremiah PET from 06/08/18 shows 1.7cm RUL nodule concerning for malignancy or metastatic disease, no hypermetabolic hilar or mediastinal adenopathy, no evidence of other metastasis.  -  He underwent surgical resection with [name] on 06/23/18 which was negative for malignancy. I reviewed pathology with patient.  -He has recovered well from surgery. He occasional mild SOB and mild upper chest pain. This is related to Jeremiah surgery and COPD.  -Will do CT chest with [name] on 5/14.  -F/u in 4 months    3. HIV -Controlled, he is onBiktarvy, f/u with ID Dr. Megan Salon   4.  Mild leukopenia and thrombocytopenia  -Probably related to Jeremiah HIV -Overall mild, continue monitoring.  5.Social issues -He has limited income, no insurance  -He has been referred to our SW, andfinancial staff  6. Smoking Cessation  -He has cut back on smoking, but I strongly encouraged him to completely quit, given cancer in Jeremiah lung and overall health.  -He has tried nicotine patch. He has completley stopped smoking     Plan -I reviewed Jeremiah  surgical pathology, no evidence of malignancy  -Lab and f/u in 4 months  -CT chest on 09/02/18 and f/u with Dr.  Servando Snare   No problem-specific Assessment & Plan notes found for this encounter.   No orders of the defined types were placed in this encounter.  I discussed the assessment and treatment plan with the patient. The patient was provided an opportunity to ask questions and all were answered. The patient agreed with the plan and demonstrated an understanding of the instructions.  The patient was advised to call back or seek an in-person evaluation if the symptoms worsen or if the condition fails to improve as anticipated.  I provided 15 minutes of non face-to-face telephone visit time during this encounter, and > 50% was spent counseling as documented under my assessment & plan.    Truitt Merle, MD 08/30/2018   I, Joslyn Devon, am acting as scribe for Truitt Merle, MD.   I have reviewed the above documentation for accuracy and completeness, and I agree with the above.

## 2018-08-30 ENCOUNTER — Inpatient Hospital Stay: Payer: Medicaid Other | Attending: Hematology | Admitting: Hematology

## 2018-08-30 ENCOUNTER — Other Ambulatory Visit: Payer: Self-pay

## 2018-08-30 ENCOUNTER — Encounter: Payer: Self-pay | Admitting: Hematology

## 2018-08-30 ENCOUNTER — Telehealth: Payer: Self-pay | Admitting: Hematology

## 2018-08-30 DIAGNOSIS — D72819 Decreased white blood cell count, unspecified: Secondary | ICD-10-CM | POA: Diagnosis not present

## 2018-08-30 DIAGNOSIS — C210 Malignant neoplasm of anus, unspecified: Secondary | ICD-10-CM | POA: Diagnosis not present

## 2018-08-30 DIAGNOSIS — B20 Human immunodeficiency virus [HIV] disease: Secondary | ICD-10-CM | POA: Diagnosis not present

## 2018-08-30 DIAGNOSIS — D696 Thrombocytopenia, unspecified: Secondary | ICD-10-CM | POA: Diagnosis not present

## 2018-08-30 DIAGNOSIS — R918 Other nonspecific abnormal finding of lung field: Secondary | ICD-10-CM

## 2018-08-30 NOTE — Telephone Encounter (Signed)
Scheduled appt per 5/11 los. ° °A calendar will be mailed out. °

## 2018-09-01 ENCOUNTER — Other Ambulatory Visit: Payer: Self-pay

## 2018-09-02 ENCOUNTER — Other Ambulatory Visit: Payer: Self-pay

## 2018-09-02 ENCOUNTER — Ambulatory Visit: Payer: Self-pay | Admitting: Cardiothoracic Surgery

## 2018-09-07 ENCOUNTER — Other Ambulatory Visit: Payer: Self-pay

## 2018-09-07 ENCOUNTER — Ambulatory Visit
Admission: RE | Admit: 2018-09-07 | Discharge: 2018-09-07 | Disposition: A | Discharge status: Home / Self Care | Payer: Self-pay | Source: Ambulatory Visit | Attending: Cardiothoracic Surgery | Admitting: Cardiothoracic Surgery

## 2018-09-07 DIAGNOSIS — R911 Solitary pulmonary nodule: Secondary | ICD-10-CM

## 2018-09-14 ENCOUNTER — Telehealth: Payer: Self-pay | Admitting: Cardiothoracic Surgery

## 2018-09-14 ENCOUNTER — Other Ambulatory Visit: Payer: Self-pay

## 2018-09-14 NOTE — Progress Notes (Signed)
error 

## 2018-09-14 NOTE — Patient Instructions (Addendum)
[name]       [name], [address]             [phone]   Telehealth Visit     Virtual Visit via Telephone Note   This visit type was conducted due to national recommendations for restrictions regarding the COVID-19 Pandemic (e.g. social distancing) in an effort to limit this patient's exposure and mitigate transmission in our community.  Due to his co-morbid illnesses, this patient is at least at moderate risk for complications without adequate follow up.  This format is felt to be most appropriate for this patient at this time.  The patient did not have access to video technology/had technical difficulties with video requiring transitioning to audio format only (telephone).  All issues noted in this document were discussed and addressed.  No physical exam could be performed with this format.        [facility] Record #[id number] Date of Birth: 04/19/1971  Referring: [facility] Primary Care: [facility] Primary Cardiologist: No primary care provider on file.   Chief Complaint:   POST OP FOLLOW UP OPERATIVE REPORT DATE OF PROCEDURE:  06/23/2018 PREOPERATIVE DIAGNOSIS:  Right upper lobe lung mass. POSTOPERATIVE DIAGNOSIS:  Right upper lobe lung mass, benign by frozen section. PROCEDURE PERFORMED:  Bronchoscopy, right video-assisted thoracoscopy, resection and stapling of large apical bleb and resection of right upper lobe lung mass with wedge resection and lymph node dissection with intercostal nerve block. SURGEON:  Lanelle Bal, MD  History of Present Illness:     I contacted Jeremiah [name] remotely due to the limitations of the current COVID pandemic on 09/14/2018  at  12:35 PM  verifying that I was speaking to Jeremiah [name] whose birthday is 1970-08-05.   I discussed limitations of the evaluation and management  of patients remotely without  the benefit of physical exam.  The  patient was agreeable with proceeding with a remote/ not face to face visit.   Patient had a follow-up CT scan of the chest done last week and returns for his virtual visit today to review the findings and make further plans.  The patient has had no fever chills no hemoptysis or cough.  He does note some paresthesias over the chest incisions on the right, these seem to come and go.  He remains active, and when I called him was headed for the bus.  We discussed the need to avoid possible covert infection.    Past Medical History:  Diagnosis Date   Anal warts    COPD (chronic obstructive pulmonary disease) ([place])    Hemorrhoids 02/25/2015   History of tuberculin skin testing within last year 2014   HIV (human immunodeficiency virus infection) ([place])    Pneumonia 2007   Pneumothorax 07/01/2018   LEFT     Social History   Tobacco Use  Smoking Status Former Smoker   Packs/day: 1.00   Years: 27.00   Pack years: 27.00   Types: Cigarettes   Last attempt to quit: 07/06/2018   Years since quitting: 0.1  Smokeless Tobacco Never Used    Social History   Substance and Sexual Activity  Alcohol Use Yes   Alcohol/week: 0.0 standard drinks   Comment: less than 40 oz a week     Allergies  Allergen Reactions   Nsaids Other (See Comments)    DRUG INTERACTION WITH HIV MEDS   Sulfa Antibiotics Itching  Current Outpatient Medications  Medication Sig Dispense Refill   albuterol (PROVENTIL HFA;VENTOLIN HFA) 108 (90 Base) MCG/ACT inhaler Inhale 1-2 puffs into the lungs every 6 (six) hours as needed for wheezing or shortness of breath.     BIKTARVY 50-200-25 MG TABS tablet TAKE 1 TABLET BY MOUTH DAILY (Patient taking differently: Take 1 tablet by mouth daily. ) 30 tablet 5   cloNIDine (CATAPRES) 0.2 MG tablet Take 1 tablet (0.2 mg total) by mouth 2 (two) times daily. 60 tablet 1   metoCLOPramide (REGLAN) 10 MG tablet Take 1 tablet (10 mg total) by mouth every 6 (six) hours.  30 tablet 0   No current facility-administered medications for this visit.        Physical Exam: There were no vitals taken for this visit. No physical exam as this was a telephone visit Although no physical exam was done the patient did describe the incision sites on the rest right chest are well-healed without drainage  Diagnostic Studies & Laboratory data:     Recent Radiology Findings:    Ct Chest Wo Contrast  Result Date: 09/08/2018 CLINICAL DATA:  48 year old male with history of anal cancer status post radiation therapy and chemotherapy. Shortness of breath and chest discomfort. Follow-up for pulmonary nodule. EXAM: CT CHEST WITHOUT CONTRAST TECHNIQUE: Multidetector CT imaging of the chest was performed following the standard protocol without IV contrast. COMPARISON:  Chest CT 02/25/2015. FINDINGS: Cardiovascular: Heart size is normal. There is no significant pericardial fluid, thickening or pericardial calcification. Atherosclerotic calcifications in the left main coronary artery. Right internal jugular single-lumen porta cath with tip terminating in the right atrium. Mediastinum/Nodes: No pathologically enlarged mediastinal or hilar lymph nodes. Please note that accurate exclusion of hilar adenopathy is limited on noncontrast CT scans. Esophagus is unremarkable in appearance. No axillary lymphadenopathy. Lungs/Pleura: Postoperative changes of wedge resection are noted in the medial aspect of the right upper lobe in the previously noted right upper lobe pulmonary nodule is no longer identified. However, today's study demonstrates a new mass-like opacity in the lateral segment of the right middle lobe (axial image 92 of series 8 and coronal image 49 of series 4) measuring 3.4 x 2.0 x 1.8 cm, in direct contact with the minor fissure. No other suspicious appearing pulmonary nodules or masses are noted. No pleural effusions. Diffuse bronchial wall thickening with mild to moderate  centrilobular and paraseptal emphysema. Upper Abdomen: Unremarkable. Musculoskeletal: There are no aggressive appearing lytic or blastic lesions noted in the visualized portions of the skeleton. IMPRESSION: 1. Status post wedge resection in the medial aspect of the right upper lobe. New mass-like opacity in the lateral segment of the right middle lobe. Although this has a very aggressive appearance, this is strongly favored to represent an area of post infectious or inflammatory scarring, particularly in light of the rapid development over the past 3 months. However, close attention on follow-up studies is recommended to ensure the resolution of this finding and exclude neoplasm. 2. Atherosclerosis, including left main coronary artery disease. Please note that although the presence of coronary artery calcium documents the presence of coronary artery disease, the severity of this disease and any potential stenosis cannot be assessed on this non-gated CT examination. Assessment for potential risk factor modification, dietary therapy or pharmacologic therapy may be warranted, if clinically indicated. Electronically Signed   By: Vinnie Langton M.D.   On: 09/08/2018 14:31    I have independently reviewed the above radiology studies  and reviewed the findings with  the patient.   Recent Lab Findings: Lab Results  Component Value Date   WBC 3.1 (L) 08/15/2018   HGB 13.5 08/15/2018   HCT 39.6 08/15/2018   PLT 163 08/15/2018   GLUCOSE 88 08/15/2018   CHOL 186 05/07/2018   TRIG 234 (H) 05/07/2018   HDL 63 05/07/2018   LDLCALC 91 05/07/2018   ALT 27 08/15/2018   AST 30 08/15/2018   NA 142 08/15/2018   K 3.8 08/15/2018   CL 111 08/15/2018   CREATININE 1.08 08/15/2018   BUN 10 08/15/2018   CO2 26 08/15/2018   INR 1.0 07/05/2018      Assessment / Plan:   Patient stable following resection of apical blebs and small apical lung mass, that was highly suspicious for malignancy but after resection was  found to be inflammatory granuloma.  The follow-up CT scan results reviewed with patient, there is a 1.5 cm inflammatory appearing mass in the lateral segment of the right middle lobe that was not previously present.  The area is wedge resection and bleb resection appears stable.  -This area is most likely inflammatory.  I discussed with the patient follow-up CT scan of the chest in 3 months and a follow-up appointment on that day.  He is agreeable with this plan.    Grace Isaac MD      [address] [place] [address] Office [phone]   Beeper [phone]  09/14/2018 12:35 PM

## 2018-09-22 ENCOUNTER — Encounter: Payer: Self-pay | Admitting: Internal Medicine

## 2018-09-28 ENCOUNTER — Telehealth: Payer: Self-pay | Admitting: Internal Medicine

## 2018-09-28 NOTE — Telephone Encounter (Signed)
COVID-19 Pre-Screening Questions:  Do you currently have a fever (>100 F), chills or unexplained body aches?no   Are you currently experiencing new cough, shortness of breath, sore throat, runny nose?no    Have you recently travelled outside the state of [place] in the last 14 days? No    1. Have you been in contact with someone that is currently pending confirmation of Covid19 testing or has been confirmed to have the [place] virus?  No

## 2018-09-29 ENCOUNTER — Telehealth: Payer: Self-pay | Admitting: *Deleted

## 2018-09-29 ENCOUNTER — Other Ambulatory Visit: Payer: Self-pay

## 2018-09-29 ENCOUNTER — Ambulatory Visit (INDEPENDENT_AMBULATORY_CARE_PROVIDER_SITE_OTHER): Payer: Self-pay | Admitting: Internal Medicine

## 2018-09-29 ENCOUNTER — Telehealth: Payer: Self-pay | Admitting: Pulmonary Disease

## 2018-09-29 ENCOUNTER — Encounter: Payer: Self-pay | Admitting: Internal Medicine

## 2018-09-29 VITALS — BP 142/89 | HR 71 | Temp 98.1°F | Wt 154.0 lb

## 2018-09-29 DIAGNOSIS — F419 Anxiety disorder, unspecified: Secondary | ICD-10-CM

## 2018-09-29 DIAGNOSIS — F17210 Nicotine dependence, cigarettes, uncomplicated: Secondary | ICD-10-CM

## 2018-09-29 DIAGNOSIS — Z23 Encounter for immunization: Secondary | ICD-10-CM

## 2018-09-29 DIAGNOSIS — B20 Human immunodeficiency virus [HIV] disease: Secondary | ICD-10-CM

## 2018-09-29 DIAGNOSIS — J8410 Pulmonary fibrosis, unspecified: Secondary | ICD-10-CM

## 2018-09-29 MED ORDER — BICTEGRAVIR-EMTRICITAB-TENOFOV 50-200-25 MG PO TABS: 1.0000 | ORAL_TABLET | Freq: Every day | ORAL | 11 refills | Status: DC

## 2018-09-29 NOTE — Assessment & Plan Note (Signed)
He is trying hard to quit smoking.  He wanted to try nicotine replacement but says that he could not afford the patches or lozenges.

## 2018-09-29 NOTE — Telephone Encounter (Signed)
09/29/2018 2051  Holly thank you for following up.  Okay to cancel.  I have reviewed the patient's chart and he has been followed by Dr. Jobie Quaker with telehealth visits.  It looks like last telehealth visit was on 09/14/2018 with a current A&P listed below:  Assessment / Plan:   Patient stable following resection of apical blebs and small apical lung mass, that was highly suspicious for malignancy but after resection was found to be inflammatory granuloma.  The follow-up CT scan results reviewed with patient, there is a 1.5 cm inflammatory appearing mass in the lateral segment of the right middle lobe that was not previously present.  The area is wedge resection and bleb resection appears stable.  -This area is most likely inflammatory.  I discussed with the patient follow-up CT scan of the chest in 3 months and a follow-up appointment on that day.  He is agreeable with this plan.  We will follow-up with Dr. Cain Saupe office and let them know that we are canceling our previously ordered CT as he recently completed 1 on 09/08/2018.  We can also use this time to remind [facility] that there is not a upcoming CT ordered which was indicated on last A&P for patient to be completed in August/2020.  Routing to [employer] as Juluis Rainier.  Routing to [facility] for follow-up.  Wyn Quaker, FNP

## 2018-09-29 NOTE — Assessment & Plan Note (Signed)
He has a new right middle lobe density following wedge resection of his right upper lobe granuloma earlier this year.  Hopeful that the new finding represents resolving scar related to his recent procedures.  He is scheduled for a follow-up CT scan in about 2 months.

## 2018-09-29 NOTE — Assessment & Plan Note (Signed)
His infection remains under very good long-term control.  He will get blood work today and continue [employer].

## 2018-09-29 NOTE — Assessment & Plan Note (Signed)
We will arrange a telephone E visit with our behavioral health counselor.

## 2018-09-29 NOTE — Telephone Encounter (Signed)
-----   Message from Satira Anis sent at 09/29/2018  3:48 PM EDT ----- Regarding: CT Signe Colt!   You placed on order for a CT for Mr. Proehl to complete in June.  However, pt completed one on 5/20.  Judson Roch took a look at it & told me not to schedule another one so soon.  But, Judson Roch did ask me to have you look at the results from the 5/20 CT & get with [name] to coordinate follow up care for this patient.   Thanks a bunch, [employer]

## 2018-09-29 NOTE — Telephone Encounter (Addendum)
Patient notes increased anxiety throughout the day during his visit with Dr Megan Salon. Will connect to [facility] for follow up. Scheduled patient for 6/15 at 9am. Landis Gandy, RN

## 2018-09-29 NOTE — Progress Notes (Signed)
Patient Active Problem List   Diagnosis Date Noted  . Pneumothorax on left 06/30/2018    Priority: High  . 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[place]) 01/06/2018    Priority: High  . Anal cancer ([place]) 11/07/2015    Priority: Medium  . Human immunodeficiency virus (HIV) disease ([place]) 05/19/2013    Priority: Medium  . Anxiety 09/29/2018  . Lung nodule 06/23/2018  . Dyslipidemia 05/19/2013  . Cigarette smoker 05/19/2013  . Depression 05/19/2013  . Dental caries 05/19/2013  . Hx of unilateral orchiectomy 05/19/2013  . Latent syphilis 05/06/2013  . Anal warts 05/06/2013  . History of chlamydia 05/06/2013    Patient's Medications  New Prescriptions   No medications on file  Previous Medications   ALBUTEROL (PROVENTIL HFA;VENTOLIN HFA) 108 (90 BASE) MCG/ACT INHALER    Inhale 1-2 puffs into the lungs every 6 (six) hours as needed for wheezing or shortness of breath.   CLONIDINE (CATAPRES) 0.2 MG TABLET    Take 1 tablet (0.2 mg total) by mouth 2 (two) times daily.   METOCLOPRAMIDE (REGLAN) 10 MG TABLET    Take 1 tablet (10 mg total) by mouth every 6 (six) hours.   MULTIPLE VITAMIN (MULTI-VITAMIN) TABLET    Take by mouth.  Modified Medications   Modified Medication Previous Medication   BICTEGRAVIR-EMTRICITABINE-TENOFOVIR AF (BIKTARVY) 50-200-25 MG TABS TABLET BIKTARVY 50-200-25 MG TABS tablet      Take 1 tablet by mouth daily.    TAKE 1 TABLET BY MOUTH DAILY  Discontinued Medications   No medications on file    Subjective: Jeremiah [name] is in for his routine follow-up visit.  He has had no problems obtaining, taking or tolerating his Biktarvy and denies missing any doses.  He was found to have a right upper lobe nodule as part of his cancer follow-up.  He underwent resection of the nodule on 06/23/2018.  Granuloma were seen on path review.  AFB and fungal stains were negative and all cultures were finalized as negative.  He has had some numbness of his right chest since surgery.  He developed  hydropneumothorax after discharge and was readmitted for chest tube placement on 06/29/2018.  He has recovered uneventfully since that time.  He has been trying to stop smoking.  He says that he no longer smokes cigarettes but has about 3 to 4 cigars daily.  His dyspnea on exertion is stable.  He says that he has been feeling more anxious than usual over the last week.  He thinks it may be related to the [place] pandemic in the recent protests over black lives matters.  He had a follow-up CT scan in mid May.  It showed a new density in the right middle lobe adjacent to the minor fissure.  He is currently not in a relationship and has not been sexually active.  Review of Systems: Review of Systems  Constitutional: Negative for chills, diaphoresis, fever and weight loss.  HENT: Negative for congestion and sore throat.   Respiratory: Positive for shortness of breath. Negative for cough and sputum production.   Cardiovascular: Negative for chest pain.       Right chest wall numbness.  Gastrointestinal: Negative for abdominal pain, diarrhea, nausea and vomiting.  Psychiatric/Behavioral: Negative for depression and substance abuse. The patient is nervous/anxious. The patient does not have insomnia.     Past Medical History:  Diagnosis Date  . Anal warts   . COPD (chronic obstructive pulmonary disease) ([place])   .  Hemorrhoids 02/25/2015  . History of tuberculin skin testing within last year 2014  . HIV (human immunodeficiency virus infection) ([place])   . Pneumonia 2007  . Pneumothorax 07/01/2018   LEFT    Social History   Tobacco Use  . Smoking status: Light Tobacco Smoker    Packs/day: 0.10    Years: 27.00    Pack years: 2.70    Types: Cigarettes, Cigars    Last attempt to quit: 07/06/2018    Years since quitting: 0.2  . Smokeless tobacco: Never Used  Substance Use Topics  . Alcohol use: Yes    Alcohol/week: 0.0 standard drinks    Comment: less than 40 oz a week  . Drug use: No     Comment: Past history of crack cocaine  use     Family History  Problem Relation Age of Onset  . Diabetes Mother   . Hypertension Mother   . Cancer Mother        breast  . Diabetes Father   . Hypertension Father     Allergies  Allergen Reactions  . Nsaids Other (See Comments)    DRUG INTERACTION WITH HIV MEDS  . Sulfa Antibiotics Itching    Health Maintenance  Topic Date Due  . TETANUS/TDAP  10/28/1989  . INFLUENZA VACCINE  11/20/2018  . HIV Screening  Completed    Objective:  Vitals:   09/29/18 1104  BP: (!) 142/89  Pulse: 71  Temp: 98.1 F (36.7 C)  TempSrc: Oral  SpO2: 98%  Weight: 154 lb (69.9 kg)   Body mass index is 21.48 kg/m.  Physical Exam Constitutional:      Comments: He is in good spirits.  Cardiovascular:     Rate and Rhythm: Normal rate and regular rhythm.     Heart sounds: No murmur.  Pulmonary:     Effort: Pulmonary effort is normal.     Breath sounds: Normal breath sounds. No wheezing or rales.  Chest:    Abdominal:     Palpations: Abdomen is soft.     Tenderness: There is no abdominal tenderness.  Psychiatric:        Mood and Affect: Mood normal.     Lab Results Lab Results  Component Value Date   WBC 3.1 (L) 08/15/2018   HGB 13.5 08/15/2018   HCT 39.6 08/15/2018   MCV 106.2 (H) 08/15/2018   PLT 163 08/15/2018    Lab Results  Component Value Date   CREATININE 1.08 08/15/2018   BUN 10 08/15/2018   NA 142 08/15/2018   K 3.8 08/15/2018   CL 111 08/15/2018   CO2 26 08/15/2018    Lab Results  Component Value Date   ALT 27 08/15/2018   AST 30 08/15/2018   ALKPHOS 57 08/15/2018   BILITOT 0.9 08/15/2018    Lab Results  Component Value Date   CHOL 186 05/07/2018   HDL 63 05/07/2018   LDLCALC 91 05/07/2018   TRIG 234 (H) 05/07/2018   CHOLHDL 3.0 05/07/2018   Lab Results  Component Value Date   LABRPR REACTIVE (A) 05/07/2018   RPRTITER 1:1 (H) 05/07/2018   HIV 1 RNA Quant (copies/mL)  Date Value  05/07/2018  <20 NOT DETECTED  12/02/2017 <20 NOT DETECTED  03/31/2016 <20   CD4 T Cell Abs (/uL)  Date Value  07/08/2018 260 (L)  05/07/2018 200 (L)  01/06/2018 200 (L)     Problem List Items Addressed This Visit      High   Pulmonary  granuloma [facility])    He has a new right middle lobe density following wedge resection of his right upper lobe granuloma earlier this year.  Hopeful that the new finding represents resolving scar related to his recent procedures.  He is scheduled for a follow-up CT scan in about 2 months.        Medium   Human immunodeficiency virus (HIV) disease ([place])    His infection remains under very good long-term control.  He will get blood work today and continue [employer].      Relevant Medications   bictegravir-emtricitabine-tenofovir AF (BIKTARVY) 50-200-25 MG TABS tablet   Other Relevant Orders   T-helper cell (CD4)- ([facility] clinic only)   HIV-1 RNA quant-no reflex-bld     Unprioritized   Cigarette smoker    He is trying hard to quit smoking.  He wanted to try nicotine replacement but says that he could not afford the patches or lozenges.      Anxiety    We will arrange a telephone E visit with our behavioral health counselor.           Michel Bickers, MD [facility] [phone] pager   [phone] cell 09/29/2018, 11:40 AM

## 2018-09-30 LAB — T-HELPER CELL (CD4) - (RCID CLINIC ONLY)
CD4 % Helper T Cell: 16 % — ABNORMAL LOW (ref 33–65)
CD4 T Cell Abs: 176 /uL — ABNORMAL LOW (ref 400–1790)

## 2018-09-30 NOTE — Telephone Encounter (Signed)
Called to let them know the information per Aaron Edelman. The office states the patient has an appointment on 10/14/18 where they will address the August 2020 CT as mentioned in last A&P.  Nothing further needed

## 2018-10-01 NOTE — Addendum Note (Signed)
Addended by: Landis Gandy on: 10/01/2018 05:07 PM   Modules accepted: Orders

## 2018-10-04 ENCOUNTER — Ambulatory Visit (INDEPENDENT_AMBULATORY_CARE_PROVIDER_SITE_OTHER): Payer: Self-pay | Admitting: Licensed Clinical Social Worker

## 2018-10-04 ENCOUNTER — Other Ambulatory Visit: Payer: Self-pay

## 2018-10-04 DIAGNOSIS — F411 Generalized anxiety disorder: Secondary | ICD-10-CM

## 2018-10-04 NOTE — Progress Notes (Signed)
Integrated Behavioral Health Visit via Telemedicine (Telephone)  10/04/2018 Jeremiah [name] [id number]   Session Start time: 8:59am  Session End time: 9:28am Total time: 30 minutes  Referring Provider: Dr Megan Salon Type of Visit: Telephonic Patient location: Patient's car [facility] Provider location: [place] home office All persons participating in visit: Counselor and patient  Confirmed patient's address: Yes  Confirmed patient's phone number: Yes  Any changes to demographics: No   Confirmed patient's insurance: Yes  Any changes to patient's insurance: No   Discussed confidentiality: Yes    The following statements were read to the patient and/or legal guardian that are established with the [facility] Provider.  "The purpose of this phone visit is to provide behavioral health care while limiting exposure to the coronavirus (COVID19).  There is a possibility of technology failure and discussed alternative modes of communication if that failure occurs."  "By engaging in this telephone visit, you consent to the provision of healthcare.  Additionally, you authorize for your insurance to be billed for the services provided during this telephone visit."   Patient and/or legal guardian consented to telephone visit: Yes   PRESENTING CONCERNS: Patient and/or family reports the following symptoms/concerns: anxious thoughts, muscle tension, tightness of chest/some shortness of breath, restlessness Duration of problem: 3 months; Severity of problem: moderate  STRENGTHS (Protective Factors/Coping Skills): Positive mindset, supportive friends/family  GOALS ADDRESSED: Patient will: 1.  Reduce symptoms of: anxiety   INTERVENTIONS: Interventions utilized:  Motivational Interviewing and Mindfulness or Relaxation Training  ASSESSMENT: Patient currently experiencing anxious thoughts, muscle tension, chest tightness/shortness of breath, and restless sensation. He reports that the  anxiety comes and goes, and while it does not have specific triggers or related thoughts, the general atmosphere and state of life lately seems to worsen it. He denies any return of depression/mood symptoms. The most appropriate diagnosis at this time is Generalized Anxiety Disorder.  Patient reports that he had lung surgery in early March, and a chest tube placed shortly after. Since that time his recovery has gone well, but his anxiety has increased. He states that he is very worried about COVID, since that seemed to spike in the community around the time of his surgeries, and that he feels anxiety/stress over the general state of the community and society. Counselor normalized these fears, and guided patient to discuss any thoughts that may accompany them. Patient wasn't able to identify specific thoughts, just a general sense of worry and foreboding. He states that he has some numbness in his chest due to surgery and scar tissue, but when he becomes aware of it he always worries that it is a sign of sickness and has to tell himself that it is normal after this surgery and not a symptom of more sickness. Counselor taught patient sensory grounding exercises for anxiety. Patient was willing to engage and practice these techniques. Counselor guided patient to identify his goals, and how counseling might help them to achieve these goals. Counselor educated patient on the purpose of anxiety, to let him know that something is important. Patient was able to identify that his health is important to him, and ways that anxiety reminds him of this. Counselor and patient explored ways that patient to respond to the anxiety, including rest, breathing techniques, and self-talk regarding the ways that patient is treating his health as important.  Patient may benefit from ongoing monthly counseling sessions.  PLAN: 1. Patient did not wish to set follow up appointments at this time, but was given counselor's contact  information to do so in the future as needed.  Lillie Fragmin

## 2018-10-08 DIAGNOSIS — Z736 Limitation of activities due to disability: Secondary | ICD-10-CM

## 2018-10-09 LAB — HIV-1 RNA QUANT-NO REFLEX-BLD
HIV 1 RNA Quant: <20 copies/mL
HIV-1 RNA Quant, Log: <1.3 Log copies/mL

## 2018-10-14 ENCOUNTER — Telehealth: Payer: Self-pay | Admitting: Cardiothoracic Surgery

## 2018-10-14 ENCOUNTER — Telehealth (INDEPENDENT_AMBULATORY_CARE_PROVIDER_SITE_OTHER): Payer: Medicaid Other | Admitting: Cardiothoracic Surgery

## 2018-10-14 ENCOUNTER — Other Ambulatory Visit: Payer: Self-pay

## 2018-10-14 DIAGNOSIS — R202 Paresthesia of skin: Secondary | ICD-10-CM

## 2018-10-14 DIAGNOSIS — J95811 Postprocedural pneumothorax: Secondary | ICD-10-CM

## 2018-10-14 DIAGNOSIS — R0789 Other chest pain: Secondary | ICD-10-CM

## 2018-10-14 NOTE — Progress Notes (Signed)
[address]       [place] [address]             [phone]   Telehealth Visit     Virtual Visit via Telephone Note   This visit type was conducted due to national recommendations for restrictions regarding the COVID-19 Pandemic (e.g. social distancing) in an effort to limit this patient's exposure and mitigate transmission in our community.  Due to his co-morbid illnesses, this patient is at least at moderate risk for complications without adequate follow up.  This format is felt to be most appropriate for this patient at this time.  The patient did not have access to video technology/had technical difficulties with video requiring transitioning to audio format only (telephone).  All issues noted in this document were discussed and addressed.  No physical exam could be performed with this format.  Please refer to the patient's chart for his  consent to telehealth for [facility].   This visit type was conducted due to national recommendations for restrictions regarding the COVID-19 Pandemic (e.g. social distancing).  This format is felt to be most appropriate for this patient at this time.  All issues noted in this document were discussed and addressed.  No physical exam was performed (except for noted visual exam findings with Video Visits).     [place] Record #[id number] Date of Birth: [birth year]  Referring: Marshell Garfinkel, MD [facility] Primary Cardiologist: No primary care provider on file.   Chief Complaint:   POST OP FOLLOW UP OPERATIVE REPORT DATE OF PROCEDURE:  06/23/2018 PREOPERATIVE DIAGNOSIS:  Right upper lobe lung mass. POSTOPERATIVE DIAGNOSIS:  Right upper lobe lung mass, benign by frozen section. PROCEDURE PERFORMED:  Bronchoscopy, right video-assisted thoracoscopy, resection and stapling of large apical bleb and resection of right upper lobe lung mass with wedge resection  and lymph node dissection with intercostal nerve block. SURGEON:  Lanelle Bal, MD  History of Present Illness:     I contacted Jeremiah [name] remotely due to the limitations of the current COVID pandemic on 10/14/2018  at  9:13 AM  verifying that I was speaking to Jeremiah [name] whose birthday is [birth year].   I discussed limitations of the evaluation and management  of patients remotely without  the benefit of physical exam.  The patient was agreeable with proceeding with a remote/ not face to face visit.   Patient notes he continues to have some vague discomfort over his right chest with numbness.  His respiratory status is improved.  He denies fevers or chills.  He says he is continuing to take his blood pressure medicine.  He was seen in infectious disease office 2 weeks ago.    Past Medical History:  Diagnosis Date  . Anal warts   . COPD (chronic obstructive pulmonary disease) ([place])   . Hemorrhoids 02/25/2015  . History of tuberculin skin testing within last year 2014  . HIV (human immunodeficiency virus infection) ([place])   . Pneumonia 2007  . Pneumothorax 07/01/2018   LEFT     Social History   Tobacco Use  Smoking Status Light Tobacco Smoker  . Packs/day: 0.10  . Years: 27.00  . Pack years: 2.70  . Types: Cigarettes, Cigars  . Last attempt to quit: 07/06/2018  . Years since quitting: 0.2  Smokeless Tobacco Never Used    Social History   Substance and Sexual Activity  Alcohol Use Yes  .  Alcohol/week: 0.0 standard drinks   Comment: less than 40 oz a week     Allergies  Allergen Reactions  . Nsaids Other (See Comments)    DRUG INTERACTION WITH HIV MEDS  . Sulfa Antibiotics Itching    Current Outpatient Medications  Medication Sig Dispense Refill  . albuterol (PROVENTIL HFA;VENTOLIN HFA) 108 (90 Base) MCG/ACT inhaler Inhale 1-2 puffs into the lungs every 6 (six) hours as needed for wheezing or shortness of breath.    . bictegravir-emtricitabine-tenofovir AF  (BIKTARVY) 50-200-25 MG TABS tablet Take 1 tablet by mouth daily. 30 tablet 11  . cloNIDine (CATAPRES) 0.2 MG tablet Take 1 tablet (0.2 mg total) by mouth 2 (two) times daily. 60 tablet 1  . metoCLOPramide (REGLAN) 10 MG tablet Take 1 tablet (10 mg total) by mouth every 6 (six) hours. (Patient not taking: Reported on 09/29/2018) 30 tablet 0  . Multiple Vitamin (MULTI-VITAMIN) tablet Take by mouth.     No current facility-administered medications for this visit.        Physical Exam: There were no vitals taken for this visit. No physical exam as this was a telephone visit   Diagnostic Studies & Laboratory data:     Recent Radiology Findings:  Recent Lab Findings: Lab Results  Component Value Date   WBC 3.1 (L) 08/15/2018   HGB 13.5 08/15/2018   HCT 39.6 08/15/2018   PLT 163 08/15/2018   GLUCOSE 88 08/15/2018   CHOL 186 05/07/2018   TRIG 234 (H) 05/07/2018   HDL 63 05/07/2018   LDLCALC 91 05/07/2018   ALT 27 08/15/2018   AST 30 08/15/2018   NA 142 08/15/2018   K 3.8 08/15/2018   CL 111 08/15/2018   CREATININE 1.08 08/15/2018   BUN 10 08/15/2018   CO2 26 08/15/2018   INR 1.0 07/05/2018      Assessment / Plan:   Patient stable following resection of apical blebs and small apical lung mass, that was highly suspicious for malignancy but after resection was found to be inflammatory granuloma. Patient is making adequate progress after surgery We will plan to see the patient back in August 2020 with a follow-up CT of the chest     Grace Isaac MD      [address] [place] [address] Office [phone]   Beeper [phone]  10/14/2018 9:13 AM

## 2018-10-16 ENCOUNTER — Other Ambulatory Visit: Payer: Self-pay | Admitting: Physician Assistant

## 2018-10-25 ENCOUNTER — Telehealth: Payer: Self-pay | Admitting: Pulmonary Disease

## 2018-10-25 ENCOUNTER — Other Ambulatory Visit: Payer: Self-pay | Admitting: Physician Assistant

## 2018-10-25 MED ORDER — ALBUTEROL SULFATE HFA 108 (90 BASE) MCG/ACT IN AERS: 1.0000 | INHALATION_SPRAY | Freq: Four times a day (QID) | RESPIRATORY_TRACT | 12 refills | Status: DC | PRN

## 2018-10-25 NOTE — Telephone Encounter (Signed)
I called pt to advise him that I sent the albuterol inhaler refills to his pharmacy but there was no answer. No VM to leave message. Will try again tomorrow.

## 2018-10-25 NOTE — Telephone Encounter (Signed)
Called & spoke w/ pt. Pt states he actually needs a refill on his albuterol rescue inhaler. According to his records, the inhaler was prescribed by a historical provider. Pt last seen 07/13/2018 by Aaron Edelman.  The medication appears in both Dr. Matilde Bash and Brian's notes, however, there is no mention of it in the plan portion.  Pt has a CT Chest scan scheduled for 12/16/2018 per Aaron Edelman.   Aaron Edelman, would it be okay to refill this patient's albuterol rescue inhaler? Please advise. Thanks.

## 2018-10-25 NOTE — Telephone Encounter (Signed)
Yes okay to refill.  1 albuterol inhaler with 12 refills.  Aaron Edelman

## 2018-10-26 NOTE — Telephone Encounter (Signed)
ATC pt, call went straight to a recording stating that the pt did not have his voicemail box set up. Will try back.

## 2018-10-27 NOTE — Telephone Encounter (Signed)
Spoke with pt. He is aware that we have sent in this prescription. Nothing further was needed.

## 2018-10-29 ENCOUNTER — Other Ambulatory Visit: Payer: Self-pay

## 2018-10-29 DIAGNOSIS — I10 Essential (primary) hypertension: Secondary | ICD-10-CM

## 2018-10-29 MED ORDER — CLONIDINE HCL 0.2 MG PO TABS: 0.2000 mg | ORAL_TABLET | Freq: Two times a day (BID) | ORAL | 1 refills | Status: DC

## 2018-11-05 ENCOUNTER — Encounter: Payer: Self-pay | Admitting: Internal Medicine

## 2018-12-16 ENCOUNTER — Encounter: Payer: Self-pay | Admitting: Cardiothoracic Surgery

## 2018-12-16 ENCOUNTER — Other Ambulatory Visit: Payer: Self-pay | Admitting: Thoracic Surgery (Cardiothoracic Vascular Surgery)

## 2018-12-16 ENCOUNTER — Other Ambulatory Visit: Payer: Self-pay

## 2018-12-21 ENCOUNTER — Telehealth: Payer: Self-pay | Admitting: Pulmonary Disease

## 2018-12-21 NOTE — Telephone Encounter (Signed)
Attempted to contact x2, voicemail is full.

## 2018-12-21 NOTE — Telephone Encounter (Signed)
Attempted to contact x2, voicemail remains fulls.

## 2018-12-21 NOTE — Telephone Encounter (Signed)
Per NP B. Warner Mccreedy have patient do CT chest w/ocontrast in 2-4 weeks with f/u with PM.

## 2018-12-22 NOTE — Telephone Encounter (Signed)
ATC, voicemail box remains full. x3 I called pt's emergency contact on file and left a message with pt's brother to give our office a call back at [phone].

## 2018-12-23 NOTE — Telephone Encounter (Signed)
Unable to reach by phone x4.

## 2018-12-24 NOTE — Telephone Encounter (Signed)
We have attempted to contact pt several times with no success or call back from pt. Per triage protocol, message will be closed.  

## 2018-12-30 ENCOUNTER — Telehealth: Payer: Self-pay | Admitting: Pulmonary Disease

## 2018-12-30 NOTE — Telephone Encounter (Signed)
?   Who placed his trach?  Will await pt to call since he does not have a phone per initial msg

## 2018-12-31 ENCOUNTER — Inpatient Hospital Stay: Payer: Medicaid Other | Admitting: Hematology

## 2018-12-31 ENCOUNTER — Inpatient Hospital Stay: Payer: Medicaid Other | Attending: Hematology

## 2018-12-31 ENCOUNTER — Ambulatory Visit
Admission: RE | Admit: 2018-12-31 | Discharge: 2018-12-31 | Disposition: A | Discharge status: Home / Self Care | Payer: Medicaid Other | Source: Ambulatory Visit | Attending: Pulmonary Disease | Admitting: Pulmonary Disease

## 2018-12-31 ENCOUNTER — Other Ambulatory Visit: Payer: Self-pay

## 2018-12-31 DIAGNOSIS — R918 Other nonspecific abnormal finding of lung field: Secondary | ICD-10-CM | POA: Diagnosis not present

## 2018-12-31 DIAGNOSIS — J8410 Pulmonary fibrosis, unspecified: Secondary | ICD-10-CM

## 2018-12-31 DIAGNOSIS — F17210 Nicotine dependence, cigarettes, uncomplicated: Secondary | ICD-10-CM

## 2018-12-31 DIAGNOSIS — R911 Solitary pulmonary nodule: Secondary | ICD-10-CM

## 2018-12-31 NOTE — Progress Notes (Signed)
CT results have come back.  Showing decreased size of spiculated opacity in right middle lobe. This is good news. No new recs.   We will follow this with another CT in 3 months.  Please make sure the patient is scheduled with a follow-up with [name] where he can further review the CT.  Please go ahead and place order for CT chest without contrast to be completed in 3 months.  Wyn Quaker, FNP

## 2019-01-03 ENCOUNTER — Telehealth: Payer: Self-pay | Admitting: Hematology

## 2019-01-03 ENCOUNTER — Ambulatory Visit: Payer: Self-pay | Admitting: Internal Medicine

## 2019-01-03 NOTE — Progress Notes (Signed)
Please keep this encounter open until the patient is reached.  And the CT is ordered.  And to follow-up with [name] as scheduled.Do not close encounter unless all of these things have been completed.Wyn Quaker, FNP

## 2019-01-03 NOTE — Progress Notes (Signed)
I am not seeing where the patient has been reached and where the CT results have been notified.  I am also not seen the CT chest order already placed.  I am unsure why this message then was closed.Sending back to triage and SS for follow-up.Wyn Quaker, FNP

## 2019-01-03 NOTE — Telephone Encounter (Signed)
Called pt . - no answer and no voice mail set up per 9/11 sch message

## 2019-01-03 NOTE — Telephone Encounter (Signed)
We still have not received a call back from the pt. We can't call the number back that was provided as it is not his phone number. Will keep message open for a few more days to see if the pt calls back.

## 2019-01-04 NOTE — Telephone Encounter (Signed)
Pt had a missed call and would like a call back . He can be reached at [phone]

## 2019-01-04 NOTE — Telephone Encounter (Signed)
Attempted to call patient at the number provided below [phone]) but no one answered. Could not leave a VM due to the inbox being full.   Will attempt to call back later.

## 2019-01-05 ENCOUNTER — Ambulatory Visit: Payer: Medicaid Other | Admitting: Cardiothoracic Surgery

## 2019-01-05 ENCOUNTER — Other Ambulatory Visit: Payer: Self-pay

## 2019-01-05 VITALS — BP 101/65 | HR 75 | Temp 97.7°F | Resp 20 | Ht 71.0 in | Wt 155.0 lb

## 2019-01-05 DIAGNOSIS — R911 Solitary pulmonary nodule: Secondary | ICD-10-CM

## 2019-01-05 NOTE — Progress Notes (Signed)
[place] Record [id number] Date of Birth: Mar 22, 1971  Referring: Marshell Garfinkel, MD [facility] Primary Cardiologist: No primary care provider on file.   Chief Complaint:   POST OP FOLLOW UP OPERATIVE REPORT DATE OF PROCEDURE:  06/23/2018 PREOPERATIVE DIAGNOSIS:  Right upper lobe lung mass. POSTOPERATIVE DIAGNOSIS:  Right upper lobe lung mass, benign by frozen section. PROCEDURE PERFORMED:  Bronchoscopy, right video-assisted thoracoscopy, resection and stapling of large apical bleb and resection of right upper lobe lung mass with wedge resection and lymph node dissection with intercostal nerve block. SURGEON:  Lanelle Bal, MD  History of Present Illness:     Patient returns office today after recent CT scan done by [name].  Results were reviewed with the patient.  He still has some episodes of shortness of breath with exertion.  Denies fever chills or evidence of active infection.       Past Medical History:  Diagnosis Date  . Anal warts   . COPD (chronic obstructive pulmonary disease) ([place])   . Hemorrhoids 02/25/2015  . History of tuberculin skin testing within last year 2014  . HIV (human immunodeficiency virus infection) ([place])   . Pneumonia 2007  . Pneumothorax 07/01/2018   LEFT     Social History   Tobacco Use  Smoking Status Light Tobacco Smoker  . Packs/day: 0.10  . Years: 27.00  . Pack years: 2.70  . Types: Cigarettes, Cigars  . Last attempt to quit: 07/06/2018  . Years since quitting: 0.5  Smokeless Tobacco Never Used    Social History   Substance and Sexual Activity  Alcohol Use Yes  . Alcohol/week: 0.0 standard drinks   Comment: less than 40 oz a week     Allergies  Allergen Reactions  . Nsaids Other (See Comments)    DRUG INTERACTION WITH HIV MEDS  . Sulfa Antibiotics Itching    Current Outpatient Medications  Medication Sig Dispense Refill  . albuterol  (VENTOLIN HFA) 108 (90 Base) MCG/ACT inhaler Inhale 1-2 puffs into the lungs every 6 (six) hours as needed for wheezing or shortness of breath. 6.7 g 12  . bictegravir-emtricitabine-tenofovir AF (BIKTARVY) 50-200-25 MG TABS tablet Take 1 tablet by mouth daily. 30 tablet 11  . cloNIDine (CATAPRES) 0.2 MG tablet Take 1 tablet (0.2 mg total) by mouth 2 (two) times daily. 60 tablet 1  . metoCLOPramide (REGLAN) 10 MG tablet Take 1 tablet (10 mg total) by mouth every 6 (six) hours. (Patient not taking: Reported on 09/29/2018) 30 tablet 0  . Multiple Vitamin (MULTI-VITAMIN) tablet Take by mouth.     No current facility-administered medications for this visit.        Physical Exam: BP 101/65   Pulse 75   Temp 97.7 F (36.5 C) (Skin)   Resp 20   Ht 5\' 11"  (1.803 m)   Wt 155 lb (70.3 kg)   SpO2 97% Comment: RA  BMI 21.62 kg/m  General appearance: alert and cooperative Neck: no adenopathy, no carotid bruit, no JVD, supple, symmetrical, trachea midline and thyroid not enlarged, symmetric, no tenderness/mass/nodules Lymph nodes: Cervical, supraclavicular, and axillary nodes normal. Resp: clear to auscultation bilaterally Cardio: regular rate and rhythm, S1, S2 normal, no murmur, click, rub or gallop GI: soft, non-tender; bowel sounds normal; no masses,  no organomegaly Extremities: extremities normal, atraumatic, no cyanosis or edema and Homans sign is negative, no sign of DVT Neurologic: Grossly normal   Diagnostic Studies & Laboratory data:  Recent Radiology Findings: Ct Chest Wo Contrast  Result Date: 12/31/2018 CLINICAL DATA:  Follow-up lung nodules, history of pneumothorax, history of right-sided VATS, history of anal cancer EXAM: CT CHEST WITHOUT CONTRAST TECHNIQUE: Multidetector CT imaging of the chest was performed following the standard protocol without IV contrast. COMPARISON:  09/07/2018 FINDINGS: Cardiovascular: Right chest port catheter. Normal heart size. No pericardial  effusion. Mediastinum/Nodes: No enlarged mediastinal, hilar, or axillary lymph nodes. Thyroid gland, trachea, and esophagus demonstrate no significant findings. Lungs/Pleura: Postoperative findings of medial right upper lobe wedge resection. Moderate centrilobular and paraseptal emphysema. Interval decrease in size of a spiculated opacity of the lateral segment right middle lobe, now measuring approximately 1.7 x 1.3 cm, previously 2.4 x 2.1 cm when measured similarly (series 8, image 93). No pleural effusion or pneumothorax. Upper Abdomen: No acute abnormality. Musculoskeletal: No chest wall mass or suspicious bone lesions identified. Nonacute sclerotic fracture of the right sixth rib. IMPRESSION: 1. Interval decrease in size of a spiculated opacity of the lateral segment right middle lobe, now measuring approximately 1.7 x 1.3 cm, previously 2.4 x 2.1 cm when measured similarly (series 8, image 93). Findings are consistent with resolving infection or inflammation given rapid development and apparent progress of resolution. Recommend additional follow-up in 3-6 months to document continued stability or resolution. 2. Redemonstrated postoperative findings of medial right upper lobe wedge resection. 3.  Emphysema. Electronically Signed   By: Eddie Candle M.D.   On: 12/31/2018 15:56   I have independently reviewed the above radiology studies  and reviewed the findings with the patient.    Recent Lab Findings: Lab Results  Component Value Date   WBC 3.1 (L) 08/15/2018   HGB 13.5 08/15/2018   HCT 39.6 08/15/2018   PLT 163 08/15/2018   GLUCOSE 88 08/15/2018   CHOL 186 05/07/2018   TRIG 234 (H) 05/07/2018   HDL 63 05/07/2018   LDLCALC 91 05/07/2018   ALT 27 08/15/2018   AST 30 08/15/2018   NA 142 08/15/2018   K 3.8 08/15/2018   CL 111 08/15/2018   CREATININE 1.08 08/15/2018   BUN 10 08/15/2018   CO2 26 08/15/2018   INR 1.0 07/05/2018      Assessment / Plan:   Follow-up CT scan shows  decreased size of right lung lesions.  Patient has a follow-up CT scan and visit arranged with [name].  I have not made him a return appointment to see me as he is closely followed in the pulmonary and infectious disease office.  Would be glad to see him at any time should subsequent scans show any indication.  Grace Isaac MD      [address] [place], [address] Office [phone]   Beeper [phone]  01/06/2019 9:11 AM

## 2019-01-06 ENCOUNTER — Encounter: Payer: Self-pay | Admitting: *Deleted

## 2019-01-06 ENCOUNTER — Encounter: Payer: Self-pay | Admitting: Cardiothoracic Surgery

## 2019-01-06 NOTE — Progress Notes (Signed)
I would suggest trying to call at different times of the day as the mornings clearly patient is available. After one more attempt I would print and mail request for patient to call our office and sign this encounter.   Wyn Quaker FNP

## 2019-01-06 NOTE — Telephone Encounter (Signed)
ATC Patient again. Called twice but received a message that the number was no longer in service. Will leave this encounter open in case he calls back.

## 2019-01-07 NOTE — Telephone Encounter (Signed)
Called patient again, received the same message that the number was no longer in service. Will close this encounter.

## 2019-01-10 ENCOUNTER — Other Ambulatory Visit: Payer: Self-pay | Admitting: *Deleted

## 2019-01-10 DIAGNOSIS — R911 Solitary pulmonary nodule: Secondary | ICD-10-CM

## 2019-01-10 NOTE — Progress Notes (Signed)
Thank you   B

## 2019-02-03 ENCOUNTER — Encounter: Payer: Self-pay | Admitting: Internal Medicine

## 2019-02-03 ENCOUNTER — Telehealth: Payer: Self-pay | Admitting: *Deleted

## 2019-02-03 ENCOUNTER — Other Ambulatory Visit: Payer: Self-pay

## 2019-02-03 ENCOUNTER — Ambulatory Visit (INDEPENDENT_AMBULATORY_CARE_PROVIDER_SITE_OTHER): Payer: Medicaid Other | Admitting: Internal Medicine

## 2019-02-03 DIAGNOSIS — B20 Human immunodeficiency virus [HIV] disease: Secondary | ICD-10-CM

## 2019-02-03 MED ORDER — SULFAMETHOXAZOLE-TRIMETHOPRIM 800-160 MG PO TABS: 1.0000 | ORAL_TABLET | Freq: Every day | ORAL | 11 refills | Status: DC

## 2019-02-03 NOTE — Telephone Encounter (Signed)
Patient called to report for the past 2 days he has had a cough and nasal dripping. He denies any fever but has some chest congestion. He states that today the mucus is yellow and he was wanting an antibiotic called in. Advised him he can do an evisit with Dr Megan Salon. Patient agreed and was given an appointment for today at 2.

## 2019-02-03 NOTE — Progress Notes (Signed)
Virtual Visit via Telephone Note  I connected with Jeremiah [name] on 02/03/19 at  2:00 PM EDT by telephone and verified that I am speaking with the correct person using two identifiers.  Location: Patient: Home Provider: [facility]   I discussed the limitations, risks, security and privacy concerns of performing an evaluation and management service by telephone and the availability of in person appointments. I also discussed with the patient that there may be a patient responsible charge related to this service. The patient expressed understanding and agreed to proceed.   History of Present Illness: I called and spoke with Jeremiah [name] today.  He has had no problems obtaining, taking or tolerating his Biktarvy and does not recall missing any dosages.  2 days ago he developed some congestion with cough productive of yellow sputum.  He also had yellow nasal drainage.  He has not had any fever, chills, shortness of breath or change in his smell or taste.   Observations/Objective: HIV 1 RNA Quant (copies/mL)  Date Value  09/29/2018 <20 NOT DETECTED  05/07/2018 <20 NOT DETECTED  12/02/2017 <20 NOT DETECTED   CD4 T Cell Abs (/uL)  Date Value  09/29/2018 176 (L)  07/08/2018 260 (L)  05/07/2018 200 (L)   HIV 1 RNA Quant (copies/mL)  Date Value  09/29/2018 <20 NOT DETECTED  05/07/2018 <20 NOT DETECTED  12/02/2017 <20 NOT DETECTED   CD4 T Cell Abs (/uL)  Date Value  09/29/2018 176 (L)  07/08/2018 260 (L)  05/07/2018 200 (L)    Assessment and Plan: He probably has a viral head cold.  Note that his last CD4 count had dropped below 200 so I will start him on pneumocystis prophylaxis with trimethoprim sulfamethoxazole 1 double strength tablet daily.  Follow Up Instructions: Continue Biktarvy Start trimethoprim sulfamethoxazole 1 double strength tablet daily Follow-up here in 1 month   I discussed the assessment and treatment plan with the patient. The patient was provided an opportunity to  ask questions and all were answered. The patient agreed with the plan and demonstrated an understanding of the instructions.   The patient was advised to call back or seek an in-person evaluation if the symptoms worsen or if the condition fails to improve as anticipated.  I provided 8 minutes of non-face-to-face time during this encounter.   Michel Bickers, MD

## 2019-02-08 ENCOUNTER — Telehealth: Payer: Self-pay

## 2019-02-08 NOTE — Telephone Encounter (Signed)
Received call in triage from patient stating [name] is no longer renewing his Clonidine and patient is requesting renewal from Dr. Megan Salon. Patient also states he has a continued cough. Inquiring if it is ok to take OTC cough medication along with Bactrim? Routing to Dr. Megan Salon for advise.  Eugenia Mcalpine

## 2019-02-08 NOTE — Telephone Encounter (Signed)
Pt called office requesting refill of Clonidine. Instructed pt to contact his PCP for refill since [name] indicated in his last progress note that he does not need to continue seeing him. Pt verbalizes understanding.

## 2019-02-08 NOTE — Telephone Encounter (Signed)
It is okay for him to take over-the-counter cough medication.  I recommend that he talk with his PCP about the clonidine.

## 2019-02-09 NOTE — Telephone Encounter (Signed)
Made patient aware of Dr. Hale Bogus advise. Provided patient with contact information for PCP with [facility]. Jeremiah [name]

## 2019-02-10 NOTE — Telephone Encounter (Signed)
Patient called office today with concerns regarding cough. States that his cough has gotten worse and is not sure what to do next. Advised patient to contact PCP or go to urgent care. Patient has not had a covid test done.  Patient states that he does not have PCP and has tried to schedule appointment with [facility], but was told they do not have any openings for another 3 weeks. Provided patient with number to IM to establish care.  Reiterated to patient if he feels like he needs to see someone regarding his cough to go to urgent care. [place]

## 2019-02-17 ENCOUNTER — Telehealth: Payer: Self-pay

## 2019-02-17 NOTE — Telephone Encounter (Signed)
Patient called office today requesting refills on his Clonidine. States that he has not been at [facility] office in a few years and Is will need refills in the meantime. Last refill was done by [name], but requested additional refills go through PCP. Provided [facility] with [facility]; advised to call them to see if they can provide refills. Patient will call office if he is still having issues. [place]

## 2019-02-21 ENCOUNTER — Ambulatory Visit (INDEPENDENT_AMBULATORY_CARE_PROVIDER_SITE_OTHER): Payer: Medicaid Other | Admitting: Internal Medicine

## 2019-02-21 ENCOUNTER — Other Ambulatory Visit: Payer: Self-pay

## 2019-02-21 ENCOUNTER — Encounter: Payer: Self-pay | Admitting: Internal Medicine

## 2019-02-21 VITALS — BP 134/94 | HR 67 | Temp 98.6°F | Ht 71.0 in | Wt 153.6 lb

## 2019-02-21 DIAGNOSIS — Z79899 Other long term (current) drug therapy: Secondary | ICD-10-CM

## 2019-02-21 DIAGNOSIS — Z833 Family history of diabetes mellitus: Secondary | ICD-10-CM | POA: Diagnosis not present

## 2019-02-21 DIAGNOSIS — Z720 Tobacco use: Secondary | ICD-10-CM | POA: Diagnosis not present

## 2019-02-21 DIAGNOSIS — R05 Cough: Secondary | ICD-10-CM | POA: Diagnosis not present

## 2019-02-21 DIAGNOSIS — Z23 Encounter for immunization: Secondary | ICD-10-CM | POA: Diagnosis not present

## 2019-02-21 DIAGNOSIS — I10 Essential (primary) hypertension: Secondary | ICD-10-CM

## 2019-02-21 DIAGNOSIS — Z8249 Family history of ischemic heart disease and other diseases of the circulatory system: Secondary | ICD-10-CM

## 2019-02-21 DIAGNOSIS — Z131 Encounter for screening for diabetes mellitus: Secondary | ICD-10-CM | POA: Diagnosis not present

## 2019-02-21 DIAGNOSIS — Z21 Asymptomatic human immunodeficiency virus [HIV] infection status: Secondary | ICD-10-CM | POA: Diagnosis not present

## 2019-02-21 LAB — POCT GLYCOSYLATED HEMOGLOBIN (HGB A1C): Hemoglobin A1C: 5.2 % (ref 4.0–5.6)

## 2019-02-21 LAB — GLUCOSE, CAPILLARY: Glucose-Capillary: 89 mg/dL (ref 70–99)

## 2019-02-21 MED ORDER — HYDROCHLOROTHIAZIDE 25 MG PO TABS: 25.0000 mg | ORAL_TABLET | Freq: Every day | ORAL | 1 refills | Status: DC

## 2019-02-21 NOTE — Progress Notes (Signed)
   CC: Medication refill and screening labs  HPI: Patient is a 48 year old male with past medical history of HIV, hypertension who presents for medication refill and request for screening labs.  Patient has cough for which he is being treated with Bactrim per cardiothoracic surgery.  Jeremiah [name] is a 48 y.o.   Past Medical History:  Diagnosis Date  . Anal warts   . COPD (chronic obstructive pulmonary disease) ([place])   . Hemorrhoids 02/25/2015  . History of tuberculin skin testing within last year 2014  . HIV (human immunodeficiency virus infection) ([place])   . Pneumonia 2007  . Pneumothorax 07/01/2018   LEFT   Review of Systems:   Review of Systems  Constitutional: Negative for chills and fever.  HENT: Negative for congestion.   Respiratory: Positive for cough (2 weeks duration, productive of yellow sputum). Negative for shortness of breath.   Cardiovascular: Negative for chest pain.  Gastrointestinal: Negative for abdominal pain, constipation, diarrhea, nausea and vomiting.  Genitourinary: Negative for dysuria.  All other systems reviewed and are negative.  Physical Exam:  Vitals:   02/21/19 0839  BP: (!) 134/94  Pulse: 67  Temp: 98.6 F (37 C)  TempSrc: Oral  Weight: 153 lb 9.6 oz (69.7 kg)  Height: 5\' 11"  (1.803 m)   Physical Exam  Constitutional: He is well-developed, well-nourished, and in no distress.  HENT:  Head: Normocephalic and atraumatic.  Eyes: EOM are normal. Right eye exhibits no discharge. Left eye exhibits no discharge.  Neck: Normal range of motion. No tracheal deviation present.  Cardiovascular: Normal rate and regular rhythm. Exam reveals no gallop and no friction rub.  No murmur heard. Pulmonary/Chest: Effort normal and breath sounds normal. No respiratory distress. He has no wheezes. He has no rales.  Abdominal: Soft. He exhibits no distension. There is no abdominal tenderness. There is no rebound and no guarding.  Musculoskeletal: Normal  range of motion.        General: No tenderness, deformity or edema.  Neurological: He is alert. Coordination normal.  Skin: Skin is warm and dry. No rash noted. He is not diaphoretic. No erythema.  Psychiatric: Memory and judgment normal.   Family History: Hypertension and diabetes in mother, father, brother.  Social: Smokes approximately 4 cigars/day, drinks three 40 ounce cans of beer per week, denies recreational drug usage.  Allergies: Reports allergy of itching with sulfa drugs.  Assessment & Plan:   See Encounters Tab for problem based charting.  Patient seen and discussed with Dr. Lynnae January

## 2019-02-21 NOTE — Patient Instructions (Signed)
You were seen for follow-up on your hypertension.  We have sent you a prescription for hydrochlorothiazide to your [place].  We want to see you again in clinic in 4 weeks to check for any side effects of this medication.  We also did lab work to screen for diabetes and will call you with the results of this test.  Feel free to call our clinic with any concerns.  Thank you for allowing [place] to be part of your medical care.

## 2019-02-21 NOTE — Assessment & Plan Note (Addendum)
Patient reports that he was started on clonidine for hypertension by cardiothoracic surgery in March.  He has run out of his medication and been off of it for a little over 2 weeks.  Patient states that he has experienced fatigue since initiation of clonidine. Patient without chest pain, or other new complaint.  Patient has family history of hypertension in mother, father, brother.   Discussed the benefits of transitioning therapy to hydrochlorothiazide due to side effects of clonidine and efficacy of hydrochlorothiazide. * Prescription sent for hydrochlorothiazide 25 mg daily * We will check BMP in 4 weeks to assess electrolytes, patient with normal baseline potassium of 3.8 in April

## 2019-02-21 NOTE — Assessment & Plan Note (Signed)
Patient with family history of diabetes in mother, father, brother.  Glucose of 88 on BMP in April, and BMI of 21.4 which would indicate lower suspicion for diabetes. Due to family history and age will screen for diabetes with hemoglobin A1c.

## 2019-02-21 NOTE — Progress Notes (Signed)
Internal Medicine Clinic Attending  I saw and evaluated the patient.  I personally confirmed the key portions of the history and exam documented by Dr. MacLean and I reviewed pertinent patient test results.  The assessment, diagnosis, and plan were formulated together and I agree with the documentation in the resident's note.  

## 2019-02-22 ENCOUNTER — Telehealth: Payer: Self-pay

## 2019-02-22 NOTE — Telephone Encounter (Signed)
Requesting lab results. Please call pt back.  

## 2019-02-24 NOTE — Telephone Encounter (Signed)
Forgot to drop a note on this. I called him yesterday and let him know the results. Thanks

## 2019-03-08 ENCOUNTER — Other Ambulatory Visit: Payer: Self-pay

## 2019-03-08 ENCOUNTER — Other Ambulatory Visit: Payer: Medicaid Other

## 2019-03-08 ENCOUNTER — Other Ambulatory Visit: Payer: Self-pay | Admitting: *Deleted

## 2019-03-08 DIAGNOSIS — B20 Human immunodeficiency virus [HIV] disease: Secondary | ICD-10-CM

## 2019-03-09 ENCOUNTER — Ambulatory Visit: Payer: Medicaid Other | Admitting: Internal Medicine

## 2019-03-09 LAB — T-HELPER CELL (CD4) - (RCID CLINIC ONLY)
CD4 % Helper T Cell: 17 % — ABNORMAL LOW (ref 33–65)
CD4 T Cell Abs: 239 /uL — ABNORMAL LOW (ref 400–1790)

## 2019-03-16 LAB — COMPLETE METABOLIC PANEL WITH GFR
AG Ratio: 2 (calc) (ref 1.0–2.5)
ALT: 19 U/L (ref 9–46)
AST: 33 U/L (ref 10–40)
Albumin: 4.2 g/dL (ref 3.6–5.1)
Alkaline phosphatase (APISO): 73 U/L (ref 36–130)
BUN: 15 mg/dL (ref 7–25)
CO2: 28 mmol/L (ref 20–32)
Calcium: 9.4 mg/dL (ref 8.6–10.3)
Chloride: 102 mmol/L (ref 98–110)
Creat: 1.08 mg/dL (ref 0.60–1.35)
GFR, Est African American: 94 mL/min/{1.73_m2} (ref >=60)
GFR, Est Non African American: 81 mL/min/{1.73_m2} (ref >=60)
Globulin: 2.1 g/dL (calc) (ref 1.9–3.7)
Glucose, Bld: 91 mg/dL (ref 65–99)
Potassium: 4.7 mmol/L (ref 3.5–5.3)
Sodium: 138 mmol/L (ref 135–146)
Total Bilirubin: 0.9 mg/dL (ref 0.2–1.2)
Total Protein: 6.3 g/dL (ref 6.1–8.1)

## 2019-03-16 LAB — CBC WITH DIFFERENTIAL/PLATELET
Absolute Monocytes: 460 cells/uL (ref 200–950)
Basophils Absolute: 28 cells/uL (ref 0–200)
Basophils Relative: 0.6 %
Eosinophils Absolute: 120 cells/uL (ref 15–500)
Eosinophils Relative: 2.6 %
HCT: 41.4 % (ref 38.5–50.0)
Hemoglobin: 14.4 g/dL (ref 13.2–17.1)
Lymphs Abs: 1541 cells/uL (ref 850–3900)
MCH: 36.7 pg — ABNORMAL HIGH (ref 27.0–33.0)
MCHC: 34.8 g/dL (ref 32.0–36.0)
MCV: 105.6 fL — ABNORMAL HIGH (ref 80.0–100.0)
MPV: 9.8 fL (ref 7.5–12.5)
Monocytes Relative: 10 %
Neutro Abs: 2452 cells/uL (ref 1500–7800)
Neutrophils Relative %: 53.3 %
Platelets: 146 10*3/uL (ref 140–400)
RBC: 3.92 10*6/uL — ABNORMAL LOW (ref 4.20–5.80)
RDW: 12.1 % (ref 11.0–15.0)
Total Lymphocyte: 33.5 %
WBC: 4.6 10*3/uL (ref 3.8–10.8)

## 2019-03-16 LAB — HIV-1 RNA QUANT-NO REFLEX-BLD
HIV 1 RNA Quant: <20 copies/mL
HIV-1 RNA Quant, Log: <1.3 Log copies/mL

## 2019-03-21 ENCOUNTER — Telehealth: Payer: Self-pay | Admitting: Pulmonary Disease

## 2019-03-21 NOTE — Telephone Encounter (Signed)
I am working on [employer]

## 2019-03-22 ENCOUNTER — Other Ambulatory Visit: Payer: Self-pay

## 2019-03-22 ENCOUNTER — Ambulatory Visit (INDEPENDENT_AMBULATORY_CARE_PROVIDER_SITE_OTHER): Payer: Medicaid Other | Admitting: Internal Medicine

## 2019-03-22 ENCOUNTER — Encounter: Payer: Self-pay | Admitting: Internal Medicine

## 2019-03-22 ENCOUNTER — Encounter: Payer: Medicaid Other | Admitting: Internal Medicine

## 2019-03-22 DIAGNOSIS — B20 Human immunodeficiency virus [HIV] disease: Secondary | ICD-10-CM | POA: Diagnosis not present

## 2019-03-22 DIAGNOSIS — C210 Malignant neoplasm of anus, unspecified: Secondary | ICD-10-CM | POA: Diagnosis not present

## 2019-03-22 DIAGNOSIS — F3342 Major depressive disorder, recurrent, in full remission: Secondary | ICD-10-CM

## 2019-03-22 DIAGNOSIS — A530 Latent syphilis, unspecified as early or late: Secondary | ICD-10-CM

## 2019-03-22 NOTE — Assessment & Plan Note (Signed)
I will make another referral for general surgery.  He needs routine surveillance follow-up for his previously treated anal cancer.  I also think it would be best to have his Port-A-Cath removed.

## 2019-03-22 NOTE — Progress Notes (Signed)
Patient Active Problem List   Diagnosis Date Noted  . Pneumothorax on left 06/30/2018    Priority: High  . 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[place]) 01/06/2018    Priority: High  . Anal cancer ([place]) 11/07/2015    Priority: Medium  . Human immunodeficiency virus (HIV) disease ([place]) 05/19/2013    Priority: Medium  . Hypertension 02/21/2019  . Screening for diabetes mellitus 02/21/2019  . Anxiety 09/29/2018  . Lung nodule 06/23/2018  . Dyslipidemia 05/19/2013  . Cigarette smoker 05/19/2013  . Depression 05/19/2013  . Dental caries 05/19/2013  . Hx of unilateral orchiectomy 05/19/2013  . Latent syphilis 05/06/2013  . Anal warts 05/06/2013  . History of chlamydia 05/06/2013    Patient's Medications  New Prescriptions   No medications on file  Previous Medications   ALBUTEROL (VENTOLIN HFA) 108 (90 BASE) MCG/ACT INHALER    Inhale 1-2 puffs into the lungs every 6 (six) hours as needed for wheezing or shortness of breath.   BICTEGRAVIR-EMTRICITABINE-TENOFOVIR AF (BIKTARVY) 50-200-25 MG TABS TABLET    Take 1 tablet by mouth daily.   HYDROCHLOROTHIAZIDE (HYDRODIURIL) 25 MG TABLET    Take 1 tablet (25 mg total) by mouth daily.   MULTIPLE VITAMIN (MULTI-VITAMIN) TABLET    Take by mouth.   SULFAMETHOXAZOLE-TRIMETHOPRIM (BACTRIM DS) 800-160 MG TABLET    Take 1 tablet by mouth daily.  Modified Medications   No medications on file  Discontinued Medications   No medications on file    Subjective: Jeremiah [name] is in for his routine HIV follow-up visit.  He has had no problems obtaining, taking or tolerating his Biktarvy and never misses a dose.  He continues to take Trimethoprim/Sulfamethoxazole, his blood pressure medication and multivitamins.  He has been struggling with more anxiety and depression during the Covid pandemic.  He is getting out and trying to get some exercise, walking, each day.  He still has his Port-A-Cath in from his chemotherapy for anal cancer last year.  He does not seem that  a referral for general surgery was ever completed so that he can get surveillance follow-up for his cancer and to talk about removal of his Port-A-Cath.  It has not been flushed in many months.  He had a recent follow-up chest CT in September which showed interval decrease in the spiculated opacity in the right middle lobe.  Review of Systems: Review of Systems  Constitutional: Negative for chills, diaphoresis, fever, malaise/fatigue and weight loss.  HENT: Negative for sore throat.   Respiratory: Negative for cough, sputum production and shortness of breath.   Cardiovascular: Negative for chest pain.  Gastrointestinal: Negative for abdominal pain, diarrhea, heartburn, nausea and vomiting.  Genitourinary: Negative for dysuria and frequency.  Musculoskeletal: Negative for joint pain and myalgias.  Skin: Negative for rash.  Neurological: Negative for dizziness and headaches.  Psychiatric/Behavioral: Positive for depression. Negative for substance abuse. The patient is nervous/anxious.     Past Medical History:  Diagnosis Date  . Anal warts   . COPD (chronic obstructive pulmonary disease) ([place])   . Hemorrhoids 02/25/2015  . History of tuberculin skin testing within last year 2014  . HIV (human immunodeficiency virus infection) ([place])   . Pneumonia 2007  . Pneumothorax 07/01/2018   LEFT    Social History   Tobacco Use  . Smoking status: Current Every Day Smoker    Packs/day: 0.10    Years: 27.00    Pack years: 2.70    Types: Cigars  .  Smokeless tobacco: Never Used  . Tobacco comment: occassionally  Substance Use Topics  . Alcohol use: Not Currently    Alcohol/week: 0.0 standard drinks    Comment: less than 40 oz a week  . Drug use: No    Comment: Past history of crack cocaine  use     Family History  Problem Relation Age of Onset  . Diabetes Mother   . Hypertension Mother   . Cancer Mother        breast  . Diabetes Father   . Hypertension Father     Allergies   Allergen Reactions  . Nsaids Other (See Comments)    DRUG INTERACTION WITH HIV MEDS  . Sulfa Antibiotics Itching    Health Maintenance  Topic Date Due  . TETANUS/TDAP  10/28/1989  . INFLUENZA VACCINE  Completed  . HIV Screening  Completed    Objective:  Vitals:   03/22/19 0935  BP: 133/83  Pulse: 65  Temp: 98.3 F (36.8 C)  TempSrc: Oral  Weight: 157 lb 3.2 oz (71.3 kg)   Body mass index is 21.92 kg/m.  Physical Exam Constitutional:      Comments: He is pleasant, talkative and slightly anxious.  Cardiovascular:     Rate and Rhythm: Normal rate and regular rhythm.     Heart sounds: No murmur.  Pulmonary:     Effort: Pulmonary effort is normal.     Breath sounds: Normal breath sounds.  Chest:    Abdominal:     Palpations: Abdomen is soft.     Tenderness: There is no abdominal tenderness.  Musculoskeletal:        General: No swelling or tenderness.  Skin:    Findings: No rash.  Neurological:     General: No focal deficit present.  Psychiatric:        Mood and Affect: Mood normal.     Lab Results Lab Results  Component Value Date   WBC 4.6 03/08/2019   HGB 14.4 03/08/2019   HCT 41.4 03/08/2019   MCV 105.6 (H) 03/08/2019   PLT 146 03/08/2019    Lab Results  Component Value Date   CREATININE 1.08 03/08/2019   BUN 15 03/08/2019   NA 138 03/08/2019   K 4.7 03/08/2019   CL 102 03/08/2019   CO2 28 03/08/2019    Lab Results  Component Value Date   ALT 19 03/08/2019   AST 33 03/08/2019   ALKPHOS 57 08/15/2018   BILITOT 0.9 03/08/2019    Lab Results  Component Value Date   CHOL 186 05/07/2018   HDL 63 05/07/2018   LDLCALC 91 05/07/2018   TRIG 234 (H) 05/07/2018   CHOLHDL 3.0 05/07/2018   Lab Results  Component Value Date   LABRPR REACTIVE (A) 05/07/2018   RPRTITER 1:1 (H) 05/07/2018   HIV 1 RNA Quant (copies/mL)  Date Value  03/08/2019 <20 NOT DETECTED  09/29/2018 <20 NOT DETECTED  05/07/2018 <20 NOT DETECTED   CD4 T Cell Abs (/uL)   Date Value  03/08/2019 239 (L)  09/29/2018 176 (L)  07/08/2018 260 (L)     Problem List Items Addressed This Visit      Medium   Human immunodeficiency virus (HIV) disease ([place])    His infection remains under very good, long-term control and he has had some CD4 reconstitution.  He has already received his influenza vaccine.  He will continue Biktarvy and stop Trimethoprim/Sulfamethoxazole now.  He will follow-up in 3 months.  Anal cancer [facility])    I will make another referral for general surgery.  He needs routine surveillance follow-up for his previously treated anal cancer.  I also think it would be best to have his Port-A-Cath removed.      Relevant Orders   Ambulatory referral to General Surgery     Unprioritized   Latent syphilis    His RPR remains very low and stable.  I suspect he is serofast.      Depression    We will give him the contact information for our behavioral health counselors and encouraged him to call today.           Michel Bickers, MD [facility] [place] [facility] [phone] pager   [phone] cell 03/22/2019, 10:01 AM

## 2019-03-22 NOTE — Assessment & Plan Note (Signed)
We will give him the contact information for our behavioral health counselors and encouraged him to call today.

## 2019-03-22 NOTE — Assessment & Plan Note (Signed)
His infection remains under very good, long-term control and he has had some CD4 reconstitution.  He has already received his influenza vaccine.  He will continue Biktarvy and stop Trimethoprim/Sulfamethoxazole now.  He will follow-up in 3 months.

## 2019-03-22 NOTE — Assessment & Plan Note (Signed)
His RPR remains very low and stable.  I suspect he is serofast.

## 2019-03-22 NOTE — Telephone Encounter (Signed)
Josem Kaufmann has been completed and placed in order [id number] Joellen [place]

## 2019-03-24 ENCOUNTER — Encounter: Payer: Self-pay | Admitting: Internal Medicine

## 2019-03-24 ENCOUNTER — Ambulatory Visit
Admission: RE | Admit: 2019-03-24 | Discharge: 2019-03-24 | Disposition: A | Discharge status: Home / Self Care | Payer: Medicaid Other | Source: Ambulatory Visit | Attending: Pulmonary Disease | Admitting: Pulmonary Disease

## 2019-03-24 ENCOUNTER — Ambulatory Visit (INDEPENDENT_AMBULATORY_CARE_PROVIDER_SITE_OTHER): Payer: Medicaid Other | Admitting: Internal Medicine

## 2019-03-24 VITALS — BP 109/68 | HR 72 | Temp 98.7°F | Ht 71.0 in | Wt 157.3 lb

## 2019-03-24 DIAGNOSIS — Z79899 Other long term (current) drug therapy: Secondary | ICD-10-CM

## 2019-03-24 DIAGNOSIS — I10 Essential (primary) hypertension: Secondary | ICD-10-CM

## 2019-03-24 DIAGNOSIS — R911 Solitary pulmonary nodule: Secondary | ICD-10-CM

## 2019-03-24 NOTE — Progress Notes (Signed)
Internal Medicine Clinic Attending  Case discussed with Dr. Harbrecht at the time of the visit.  We reviewed the resident's history and exam and pertinent patient test results.  I agree with the assessment, diagnosis, and plan of care documented in the resident's note.   

## 2019-03-24 NOTE — Progress Notes (Signed)
   CC: HTN  HPI:[name] is a 48 y.o. male who presents for evaluation of HTN. Please see individual problem based A/P for details.  Past Medical History:  Diagnosis Date  . Anal warts   . COPD (chronic obstructive pulmonary disease) ([place])   . Hemorrhoids 02/25/2015  . History of tuberculin skin testing within last year 2014  . HIV (human immunodeficiency virus infection) ([place])   . Pneumonia 2007  . Pneumothorax 07/01/2018   LEFT   Review of Systems:  ROS negative except as per HPI.  Physical Exam: Vitals:   03/24/19 1321  BP: 109/68  Pulse: 72  Temp: 98.7 F (37.1 C)  TempSrc: Oral  SpO2: 100%  Weight: 157 lb 4.8 oz (71.4 kg)  Height: 5\' 11"  (1.803 m)   General: A/O x4, in no acute distress, afebrile, nondiaphoretic HEENT: PEERL, EMO intact Cardio: RRR, no mrg's  Pulmonary: CTA bilaterally, no wheezing or crackles  MSK: BLE nontender, nonedematous Psych: Appropriate affect, not depressed in appearance, engages well  Assessment & Plan:   See Encounters Tab for problem based charting.  Patient discussed with Dr. Philipp Ovens

## 2019-03-24 NOTE — Patient Instructions (Signed)
FOLLOW-UP INSTRUCTIONS When: 6 months For: Routine visit What to bring: All of your medications  Today we discussed your high blood pressure history.  Your blood pressure looks good today at 109/68.  As you have tolerated and have been stable on the hydrochlorothiazide tablet at 25 mg daily I would not recommend any changes.  We will go ahead and obtain the lab today to make certain that your creatinine and potassium are normal.  Please continue to eat a low sodium diet of less than 2 g of sodium per day and continue to exercise at moderate intensity at least 30 minutes/day approximate 5 days a week.  Please let us know if we have any concerns for any issues that may arise.  Thank you for your visit to the [facility] today. If you have any questions or concerns please call us at [phone].

## 2019-03-24 NOTE — Assessment & Plan Note (Signed)
  Hypertension: Patient's BP today is 109/68 with a goal of <140/80. The patient endorses adherence to his medication regimen. He denied, chest pain, headache, visual changes, lightheadedness, weakness, dizziness on standing, swelling in the feet or ankles.  The patients most recent sCr was 1.08, on 03/08/2019 which was obtained for the [place] clinic but may not represent the full potential affect of his HCTZ. He is requesting the lab as well today to be certain it is stable and I am in agreement with obtaining the BMP.  Plan: Continue HCTZ 25mg  daily Return in 6 months for a BP check and BMP

## 2019-03-25 ENCOUNTER — Telehealth: Payer: Self-pay | Admitting: *Deleted

## 2019-03-25 LAB — BMP8+ANION GAP
Anion Gap: 12 mmol/L (ref 10.0–18.0)
BUN/Creatinine Ratio: 14 (ref 9–20)
BUN: 17 mg/dL (ref 6–24)
CO2: 25 mmol/L (ref 20–29)
Calcium: 9.2 mg/dL (ref 8.7–10.2)
Chloride: 102 mmol/L (ref 96–106)
Creatinine, Ser: 1.18 mg/dL (ref 0.76–1.27)
GFR calc Af Amer: 84 mL/min/{1.73_m2} (ref >59)
GFR calc non Af Amer: 73 mL/min/{1.73_m2} (ref >59)
Glucose: 86 mg/dL (ref 65–99)
Potassium: 4.3 mmol/L (ref 3.5–5.2)
Sodium: 139 mmol/L (ref 134–144)

## 2019-03-25 NOTE — Telephone Encounter (Signed)
ATC patient unable to reach LM to call back office (x1)   Will leave in results to follow up on, but also route to [place] desk to schedule with [name].

## 2019-03-25 NOTE — Progress Notes (Signed)
Right middle lobe spiculated nodule continues to decrease.  This is good news.  There are no new or progressive findings on the CT.  Patient needs appointment with [name] sometime over the next 1 to 4 weeks to further review the CT.  Wyn Quaker, FNP

## 2019-03-25 NOTE — Telephone Encounter (Signed)
-----   Message from Lauraine Rinne, NP sent at 03/25/2019  9:58 AM EST ----- Right middle lobe spiculated nodule continues to decrease.  This is good news.  There are no new or progressive findings on the CT.  Patient needs appointment with [name] sometime over the next 1 to 4 weeks to further review the CT.  Wyn Quaker, FNP

## 2019-03-28 ENCOUNTER — Telehealth: Payer: Self-pay | Admitting: Internal Medicine

## 2019-03-28 ENCOUNTER — Other Ambulatory Visit: Payer: Self-pay | Admitting: General Surgery

## 2019-03-28 DIAGNOSIS — Z85048 Personal history of other malignant neoplasm of rectum, rectosigmoid junction, and anus: Secondary | ICD-10-CM | POA: Diagnosis not present

## 2019-03-28 NOTE — H&P (Signed)
The patient is a 48 year old male who presents with anal lesions. 48 year old male with HIV who presents to the office approximately 2 years status post treatment of anal cancer with radiation and chemotherapy in [place]. It does not appear that he has had any follow-up for this. He did see Dr. Burr Medico, who has been doing surveillance CT scans. He did have a lung nodule which was biopsied by [name] and found to be granulomatous disease. He reports that he is asymptomatic. He occasionally has some blood on the toilet tissue when he has a large bowel movement. He also has a right-sided port that needs to be removed.   Past Surgical History (Tanisha A. Owens Shark, [place]; 03/28/2019 9:44 AM) Laparoscopic Inguinal Hernia Surgery Right.  Diagnostic Studies History (Tanisha A. Owens Shark, [place]; 03/28/2019 9:44 AM) Colonoscopy 1-5 years ago  Allergies (Tanisha A. Owens Shark, [place]; 03/28/2019 9:47 AM) Sulfa Antibiotics Allergies Reconciled  Medication History (Tanisha A. Owens Shark, [place]; 03/28/2019 9:47 AM) hydroCHLOROthiazide (25MG  Tablet, Oral) Active. Biktarvy (50-200-25MG  Tablet, Oral) Active. Medications Reconciled  Social History (Tanisha A. Owens Shark, [place]; 03/28/2019 9:44 AM) Alcohol use Moderate alcohol use. No caffeine use No drug use Tobacco use Current some day smoker.  Family History (Tanisha A. Owens Shark, [place]; 03/28/2019 9:44 AM) Diabetes Mellitus Brother, Father, Mother. Hypertension Brother, Father, Mother.  Other Problems (Tanisha A. Owens Shark, [place]; 03/28/2019 9:44 AM) Chronic Obstructive Lung Disease High blood pressure HIV-positive Inguinal Hernia Rectal Cancer     Review of Systems (Tanisha A. Brown RMA; 03/28/2019 9:44 AM) General Not Present- Appetite Loss, Chills, Fatigue, Fever, Night Sweats, Weight Gain and Weight Loss. Skin Not Present- Change in Wart/Mole, Dryness, Hives, Jaundice, New Lesions, Non-Healing Wounds, Rash and Ulcer. HEENT Not Present- Earache, Hearing  Loss, Hoarseness, Nose Bleed, Oral Ulcers, Ringing in the Ears, Seasonal Allergies, Sinus Pain, Sore Throat, Visual Disturbances, Wears glasses/contact lenses and Yellow Eyes. Respiratory Not Present- Bloody sputum, Chronic Cough, Difficulty Breathing, Snoring and Wheezing. Breast Not Present- Breast Mass, Breast Pain, Nipple Discharge and Skin Changes. Cardiovascular Not Present- Chest Pain, Difficulty Breathing Lying Down, Leg Cramps, Palpitations, Rapid Heart Rate, Shortness of Breath and Swelling of Extremities. Gastrointestinal Not Present- Abdominal Pain, Bloating, Bloody Stool, Change in Bowel Habits, Chronic diarrhea, Constipation, Difficulty Swallowing, Excessive gas, Gets full quickly at meals, Hemorrhoids, Indigestion, Nausea, Rectal Pain and Vomiting. Male Genitourinary Not Present- Blood in Urine, Change in Urinary Stream, Frequency, Impotence, Nocturia, Painful Urination, Urgency and Urine Leakage. Musculoskeletal Not Present- Back Pain, Joint Pain, Joint Stiffness, Muscle Pain, Muscle Weakness and Swelling of Extremities. Neurological Not Present- Decreased Memory, Fainting, Headaches, Numbness, Seizures, Tingling, Tremor, Trouble walking and Weakness. Psychiatric Not Present- Anxiety, Bipolar, Change in Sleep Pattern, Depression, Fearful and Frequent crying. Endocrine Not Present- Cold Intolerance, Excessive Hunger, Hair Changes, Heat Intolerance, Hot flashes and New Diabetes. Hematology Not Present- Blood Thinners, Easy Bruising, Excessive bleeding, Gland problems, HIV and Persistent Infections.  Vitals (Tanisha A. Brown RMA; 03/28/2019 9:46 AM) 03/28/2019 9:45 AM Weight: 155 lb Height: 71in Body Surface Area: 1.89 m Body Mass Index: 21.62 kg/m  Temp.: 97.20F  Pulse: 92 (Regular)  BP: 124/82 (Sitting, Left Arm, Standard)        Physical Exam Leighton Ruff MD; [id number] 10:04 AM)  General Mental Status-Alert. General Appearance-Not in acute  distress. Build & Nutrition-Well nourished. Posture-Normal posture. Gait-Normal.  Head and Neck Head-normocephalic, atraumatic with no lesions or palpable masses. Trachea-midline.  Chest and Lung Exam Chest and lung exam reveals -normal excursion with symmetric chest walls and  quiet, even and easy respiratory effort with no use of accessory muscles.  Cardiovascular Cardiovascular examination reveals -normal heart sounds, regular rate and rhythm with no murmurs and no digital clubbing, cyanosis, edema, increased warmth or tenderness.  Abdomen Inspection Inspection of the abdomen reveals - No Hernias. Palpation/Percussion Palpation and Percussion of the abdomen reveal - Soft, Non Tender, No Rigidity (guarding), No hepatosplenomegaly and No Palpable abdominal masses.  Rectal Note: Possible scar noted in the left anterior lateral region. No stricture noted. No external lesions noted.  Neurologic Neurologic evaluation reveals -alert and oriented x 3 with no impairment of recent or remote memory, normal attention span and ability to concentrate, normal sensation and normal coordination.  Musculoskeletal Normal Exam - Bilateral-Upper Extremity Strength Normal and Lower Extremity Strength Normal.  Lymphatic Femoral & Inguinal -Note:No lymphadenopathy noted bilaterally.    Results Leighton Ruff MD; [id number] 10:05 AM) Procedures  Name Value Date ANOSCOPY, DIAGNOSTIC [id number]) [ Hemorrhoids ] Procedure Other: Procedure: Anoscopy....Marland KitchenMarland KitchenSurgeon: Marcello Moores....Marland KitchenMarland KitchenAfter the risks and benefits were explained, verbal consent was obtained for above procedure. A medical assistant chaperone was present thoroughout the entire procedure. ....Marland KitchenMarland KitchenAnesthesia: none....Marland KitchenMarland KitchenDiagnosis: History of anal cancer....Marland KitchenMarland KitchenFindings: possible scar noted in left anterior lateral anal canal. Minimal visual findings noted circumferentially. No external lesions noted.  Performed:  03/28/2019 10:04 AM    Assessment & Plan Leighton Ruff MD; [id number] 10:04 AM)  HISTORY OF ANAL CANCER (Z85.048) Impression: 48 year old male with HIV who presents to the office for evaluation of his anal cancer. This was treated approximately 2 years ago. On exam today, he appears to have no sign of recurrent cancer. He does have a port in his right chest. We will plan on removing this in the operating room under sedation. Risk of bleeding and infection were discussed with patient. I believe he understands this and wishes to proceed with surgery.

## 2019-03-28 NOTE — Telephone Encounter (Signed)
Called patient at [phone] to notify him of his normal BMP results.  No medication changes recommended at this time.  Repeat BMP in 6 weeks and blood pressure at that time.  There is no answer.  I cannot leave a voicemail as an answering service and established.

## 2019-03-31 NOTE — Telephone Encounter (Signed)
Attempted to call pt at both numbers.  Left VM on mobile number and home number just rings and goes to busy signal.  2nd attempted.  03/31/19 -ta

## 2019-04-05 ENCOUNTER — Ambulatory Visit (INDEPENDENT_AMBULATORY_CARE_PROVIDER_SITE_OTHER): Payer: Medicaid Other | Admitting: Pulmonary Disease

## 2019-04-05 ENCOUNTER — Other Ambulatory Visit: Payer: Self-pay

## 2019-04-05 ENCOUNTER — Encounter: Payer: Self-pay | Admitting: Pulmonary Disease

## 2019-04-05 VITALS — BP 122/80 | HR 66 | Temp 97.8°F | Ht 71.0 in | Wt 159.8 lb

## 2019-04-05 DIAGNOSIS — R911 Solitary pulmonary nodule: Secondary | ICD-10-CM | POA: Diagnosis not present

## 2019-04-05 DIAGNOSIS — F17210 Nicotine dependence, cigarettes, uncomplicated: Secondary | ICD-10-CM

## 2019-04-05 MED ORDER — VARENICLINE TARTRATE 0.5 MG X 11 & 1 MG X 42 PO MISC: ORAL | 0 refills | Status: DC

## 2019-04-05 MED ORDER — NICOTINE 14 MG/24HR TD PT24: 14.0000 mg | MEDICATED_PATCH | Freq: Every day | TRANSDERMAL | 0 refills | Status: DC

## 2019-04-05 NOTE — Patient Instructions (Signed)
Call in a prescription for Chantix and nicotine patches Refer you to pharmacy for smoking cessation  Check alpha-1 antitrypsin levels and phenotype Schedule a CT chest without contrast in 6 months Follow-up in clinic in 6 months

## 2019-04-05 NOTE — Progress Notes (Signed)
Jeremiah [name]    NX:[id number]    Dec 27, 1970  Primary Care Physician:MacLean, Rodman Key, MD  Referring Physician: Jeanmarie Hubert, MD [address],  [place] [address]  Chief complaint: Follow-up for lung nodules, emphysema  HPI: 48 year old active smoker with HIV, anal cancer.  Referred for abnormal CT with right lung nodule  Diagnosed with anal cancer in November of 2018 and underwent chemotherapy, radiation at [facility] in [place].  Noted to have lung nodules on follow-up in March 2019 in [place] and surveillance recommended.  He has subsequently had CT scan and PET scan follow-up at [facility] this month which showed an FDG avid 1.7 cm nodule in the right upper lobe.  Has chronic cough with white mucus, no hemoptysis, loss of weight, loss of appetite. 30-pack-year smoker and continues to smoke 1 pack/day.  Been told that he has COPD but no PFTs on record.  Interim history: Underwent right upper lobe wedge resection of lung nodule in March 2020 which turned out to be granulomatous inflammation.  Postop developed hydropneumothorax requiring chest tube placement He subsequently developed right middle lobe spiculated opacity noted on CT scan May 2020 which is being followed by serial CTs.  The nodule has overall improved in size.  States that he is doing well with no issues.  He has occasional dyspnea for which he uses Ventolin Continues to smoke.  Outpatient Encounter Medications as of 04/05/2019  Medication Sig  . albuterol (VENTOLIN HFA) 108 (90 Base) MCG/ACT inhaler Inhale 1-2 puffs into the lungs every 6 (six) hours as needed for wheezing or shortness of breath.  . bictegravir-emtricitabine-tenofovir AF (BIKTARVY) 50-200-25 MG TABS tablet Take 1 tablet by mouth daily.  . hydrochlorothiazide (HYDRODIURIL) 25 MG tablet Take 1 tablet (25 mg total) by mouth daily.  . Multiple Vitamin (MULTI-VITAMIN) tablet Take by mouth.  .  [DISCONTINUED] sulfamethoxazole-trimethoprim (BACTRIM DS) 800-160 MG tablet Take 1 tablet by mouth daily.   No facility-administered encounter medications on file as of 04/05/2019.   Physical Exam: Blood pressure 122/80, pulse 66, temperature 97.8 F (36.6 C), temperature source Temporal, height 5\' 11"  (1.803 m), weight 159 lb 12.8 oz (72.5 kg), SpO2 98 %. Gen:      No acute distress HEENT:  EOMI, sclera anicteric Neck:     No masses; no thyromegaly Lungs:    Clear to auscultation bilaterally; normal respiratory effort CV:         Regular rate and rhythm; no murmurs Abd:      + bowel sounds; soft, non-tender; no palpable masses, no distension Ext:    No edema; adequate peripheral perfusion Skin:      Warm and dry; no rash Neuro: alert and oriented x 3 Psych: normal mood and affect  Data Reviewed: Imaging: CT chest, abdomen pelvis 05/22/2018- 1.7 cm right upper lobe nodule, 2.1 cm right hilar lymph node.  No significant abnormality in the abdomen or pelvis.  PET scan 06/08/2018- FDG uptake in right upper lobe lung nodule, SUV 4.04.  No additional uptake elsewhere in the body or in the mediastinum.  Mild nonspecific uptake in the fat at the level of anus.  CT chest 09/07/2018-new masslike opacity in the right middle lobe.  Emphysema, coronary atherosclerosis  CT chest 12/31/2018-decrease in size of right middle lobe.  Emphysema  CT chest 03/24/2019-new decrease in size of right middle lobe nodule.  Emphysema. I have reviewed the images personally  PFTs: 06/10/2018-FVC 5.01 [113%], FEV1  3.83 [108%], F/F 76, TLC 6.65 [93%], DLCO 23.8 [77%] Minimal obstructive airway disease.  Minimal diffusion defect.  No restriction.  Labs: CBC 03/08/2019-WBC 4.6, eosinophils 2.6%, absolute eosinophil count 120  Pathology 06/23/2018 Necrotizing inflammation with fibrosis.  Lymph nodes negative for malignancy  Assessment:  Lung nodules He has resection of right upper lobe lung nodule with path showing  necrotizing granulomatous inflammation on pathology with no evidence of malignancy Subsequently developed right middle lobe spiculated nodule that is improving on follow-up CT scans.  Suspect resolving scar.  Reviewed imaging with patient  Order follow-up CT scan in 6 months.  Emphysema. Has significant emphysematous changes on CT scan.  PFTs do not show overt obstruction No need for inhalers at present Check alpha-1 antitrypsin levels and phenotype  Active smoker Discussed smoking cessation.  He is ready to quit.  Will try nicotine patches and Chantix Refer to pharmacy clinic for smoking cessation.  Reassess in 6 months.  Time spent counseling-5 minutes  Plan/Recommendations: - CT in 6 months - Smoking cessation  Marshell Garfinkel MD [facility] 04/05/2019, 9:34 AM  CC: Jeanmarie Hubert, MD

## 2019-04-13 LAB — ALPHA-1 ANTITRYPSIN PHENOTYPE: A-1 Antitrypsin, Ser: 98 mg/dL (ref 83–199)

## 2019-04-22 DIAGNOSIS — K760 Fatty (change of) liver, not elsewhere classified: Secondary | ICD-10-CM

## 2019-04-22 HISTORY — DX: Fatty (change of) liver, not elsewhere classified: K76.0

## 2019-04-25 ENCOUNTER — Other Ambulatory Visit: Payer: Self-pay | Admitting: Internal Medicine

## 2019-04-25 DIAGNOSIS — I10 Essential (primary) hypertension: Secondary | ICD-10-CM

## 2019-05-06 ENCOUNTER — Encounter (HOSPITAL_BASED_OUTPATIENT_CLINIC_OR_DEPARTMENT_OTHER): Payer: Self-pay | Admitting: General Surgery

## 2019-05-06 ENCOUNTER — Other Ambulatory Visit: Payer: Self-pay

## 2019-05-06 NOTE — Progress Notes (Signed)
Spoke with: Jeremiah [name] NPO:  After Midnight, no gum, candy, or mints   Arrival time: 0630 AM Labs: Istat 8 (EKG 06/30/2018 in epic) AM medications: Biktarvy, Bring Inhaler Pre op orders: Yes Ride home: Currently arranging transportation

## 2019-05-07 ENCOUNTER — Other Ambulatory Visit (HOSPITAL_COMMUNITY)
Admission: RE | Admit: 2019-05-07 | Discharge: 2019-05-07 | Disposition: A | Discharge status: Home / Self Care | Payer: Medicaid Other | Source: Ambulatory Visit | Attending: General Surgery | Admitting: General Surgery

## 2019-05-07 DIAGNOSIS — Z20822 Contact with and (suspected) exposure to covid-19: Secondary | ICD-10-CM | POA: Insufficient documentation

## 2019-05-07 DIAGNOSIS — Z01812 Encounter for preprocedural laboratory examination: Secondary | ICD-10-CM | POA: Insufficient documentation

## 2019-05-08 LAB — NOVEL CORONAVIRUS, NAA (HOSP ORDER, SEND-OUT TO REF LAB; TAT 18-24 HRS): SARS-CoV-2, NAA: NOT DETECTED

## 2019-05-10 NOTE — Anesthesia Preprocedure Evaluation (Addendum)
Anesthesia Evaluation  Patient identified by MRN, date of birth, ID band Patient awake    Reviewed: Allergy & Precautions, NPO status , Patient's Chart, lab work & pertinent test results  Airway Mallampati: II  TM Distance: >3 FB Neck ROM: Full    Dental  (+) Dental Advisory Given   Pulmonary COPD, Current Smoker,    breath sounds clear to auscultation       Cardiovascular hypertension, Pt. on medications  Rhythm:Regular Rate:Normal     Neuro/Psych negative neurological ROS     GI/Hepatic Neg liver ROS, Rectal CA   Endo/Other  negative endocrine ROS  Renal/GU negative Renal ROS     Musculoskeletal   Abdominal   Peds  Hematology  (+) HIV,   Anesthesia Other Findings   Reproductive/Obstetrics                            Anesthesia Physical Anesthesia Plan  ASA: II  Anesthesia Plan: MAC   Post-op Pain Management:    Induction:   PONV Risk Score and Plan: 0 and Propofol infusion and Treatment may vary due to age or medical condition  Airway Management Planned: Natural Airway and Simple Face Mask  Additional Equipment:   Intra-op Plan:   Post-operative Plan:   Informed Consent: I have reviewed the patients History and Physical, chart, labs and discussed the procedure including the risks, benefits and alternatives for the proposed anesthesia with the patient or authorized representative who has indicated his/her understanding and acceptance.       Plan Discussed with: CRNA  Anesthesia Plan Comments:        Anesthesia Quick Evaluation

## 2019-05-11 ENCOUNTER — Other Ambulatory Visit (HOSPITAL_COMMUNITY): Payer: Self-pay | Admitting: General Surgery

## 2019-05-11 ENCOUNTER — Encounter (HOSPITAL_BASED_OUTPATIENT_CLINIC_OR_DEPARTMENT_OTHER): Payer: Self-pay | Admitting: General Surgery

## 2019-05-11 ENCOUNTER — Ambulatory Visit (HOSPITAL_BASED_OUTPATIENT_CLINIC_OR_DEPARTMENT_OTHER)
Admission: RE | Admit: 2019-05-11 | Discharge: 2019-05-11 | Disposition: A | Discharge status: Home / Self Care | Payer: Medicaid Other | Attending: General Surgery | Admitting: General Surgery

## 2019-05-11 ENCOUNTER — Ambulatory Visit (HOSPITAL_BASED_OUTPATIENT_CLINIC_OR_DEPARTMENT_OTHER): Payer: Medicaid Other | Admitting: Anesthesiology

## 2019-05-11 ENCOUNTER — Other Ambulatory Visit: Payer: Self-pay

## 2019-05-11 ENCOUNTER — Telehealth (HOSPITAL_COMMUNITY): Payer: Self-pay | Admitting: Radiology

## 2019-05-11 ENCOUNTER — Encounter (HOSPITAL_BASED_OUTPATIENT_CLINIC_OR_DEPARTMENT_OTHER)
Admission: RE | Disposition: A | Discharge status: Home / Self Care | Payer: Self-pay | Source: Home / Self Care | Attending: General Surgery

## 2019-05-11 DIAGNOSIS — F418 Other specified anxiety disorders: Secondary | ICD-10-CM | POA: Diagnosis not present

## 2019-05-11 DIAGNOSIS — Z882 Allergy status to sulfonamides status: Secondary | ICD-10-CM | POA: Diagnosis not present

## 2019-05-11 DIAGNOSIS — Z452 Encounter for adjustment and management of vascular access device: Secondary | ICD-10-CM | POA: Insufficient documentation

## 2019-05-11 DIAGNOSIS — Z833 Family history of diabetes mellitus: Secondary | ICD-10-CM | POA: Insufficient documentation

## 2019-05-11 DIAGNOSIS — Z21 Asymptomatic human immunodeficiency virus [HIV] infection status: Secondary | ICD-10-CM | POA: Diagnosis not present

## 2019-05-11 DIAGNOSIS — K4090 Unilateral inguinal hernia, without obstruction or gangrene, not specified as recurrent: Secondary | ICD-10-CM | POA: Diagnosis not present

## 2019-05-11 DIAGNOSIS — E785 Hyperlipidemia, unspecified: Secondary | ICD-10-CM | POA: Diagnosis not present

## 2019-05-11 DIAGNOSIS — F17200 Nicotine dependence, unspecified, uncomplicated: Secondary | ICD-10-CM | POA: Insufficient documentation

## 2019-05-11 DIAGNOSIS — Z85048 Personal history of other malignant neoplasm of rectum, rectosigmoid junction, and anus: Secondary | ICD-10-CM

## 2019-05-11 DIAGNOSIS — J449 Chronic obstructive pulmonary disease, unspecified: Secondary | ICD-10-CM | POA: Diagnosis not present

## 2019-05-11 DIAGNOSIS — Z888 Allergy status to other drugs, medicaments and biological substances status: Secondary | ICD-10-CM | POA: Insufficient documentation

## 2019-05-11 DIAGNOSIS — I10 Essential (primary) hypertension: Secondary | ICD-10-CM | POA: Diagnosis not present

## 2019-05-11 DIAGNOSIS — Z8249 Family history of ischemic heart disease and other diseases of the circulatory system: Secondary | ICD-10-CM | POA: Insufficient documentation

## 2019-05-11 HISTORY — DX: Solitary pulmonary nodule: R91.1

## 2019-05-11 HISTORY — DX: Depression, unspecified: F32.A

## 2019-05-11 HISTORY — PX: PORT-A-CATH REMOVAL: SHX5289

## 2019-05-11 HISTORY — DX: Essential (primary) hypertension: I10

## 2019-05-11 HISTORY — DX: Carpal tunnel syndrome, unspecified upper limb: G56.00

## 2019-05-11 HISTORY — DX: Latent syphilis, unspecified as early or late: A53.0

## 2019-05-11 HISTORY — DX: Malignant neoplasm of rectum: C20

## 2019-05-11 HISTORY — DX: Malignant neoplasm of anus, unspecified: C21.0

## 2019-05-11 LAB — POCT I-STAT, CHEM 8
BUN: 11 mg/dL (ref 6–20)
Calcium, Ion: 1.22 mmol/L (ref 1.15–1.40)
Chloride: 104 mmol/L (ref 98–111)
Creatinine, Ser: 1 mg/dL (ref 0.61–1.24)
Glucose, Bld: 92 mg/dL (ref 70–99)
HCT: 44 % (ref 39.0–52.0)
Hemoglobin: 15 g/dL (ref 13.0–17.0)
Potassium: 4.6 mmol/L (ref 3.5–5.1)
Sodium: 139 mmol/L (ref 135–145)
TCO2: 27 mmol/L (ref 22–32)

## 2019-05-11 SURGERY — REMOVAL PORT-A-CATH
Anesthesia: Monitor Anesthesia Care | Site: Chest

## 2019-05-11 MED ORDER — BUPIVACAINE-EPINEPHRINE 0.5% -1:200000 IJ SOLN
INTRAMUSCULAR | Status: DC | PRN
  Administered 2019-05-11: 10 mL
  Administered 2019-05-11: 13 mL

## 2019-05-11 MED ORDER — FENTANYL CITRATE (PF) 100 MCG/2ML IJ SOLN
INTRAMUSCULAR | Status: DC | PRN
  Administered 2019-05-11: 50 ug via INTRAVENOUS

## 2019-05-11 MED ORDER — ONDANSETRON HCL 4 MG/2ML IJ SOLN
INTRAMUSCULAR | Status: DC | PRN
  Administered 2019-05-11: 4 mg via INTRAVENOUS

## 2019-05-11 MED ORDER — CEFAZOLIN SODIUM-DEXTROSE 2-4 GM/100ML-% IV SOLN
INTRAVENOUS | Status: AC
  Filled 2019-05-11: qty 100

## 2019-05-11 MED ORDER — PROMETHAZINE HCL 25 MG/ML IJ SOLN
6.2500 mg | INTRAMUSCULAR | Status: DC | PRN
  Filled 2019-05-11: qty 1

## 2019-05-11 MED ORDER — FENTANYL CITRATE (PF) 100 MCG/2ML IJ SOLN
25.0000 ug | INTRAMUSCULAR | Status: DC | PRN
  Filled 2019-05-11: qty 1

## 2019-05-11 MED ORDER — LACTATED RINGERS IV SOLN
INTRAVENOUS | Status: DC
  Filled 2019-05-11: qty 1000

## 2019-05-11 MED ORDER — PROPOFOL 500 MG/50ML IV EMUL
INTRAVENOUS | Status: DC | PRN
  Administered 2019-05-11: 125 ug/kg/min via INTRAVENOUS

## 2019-05-11 MED ORDER — CEFAZOLIN SODIUM-DEXTROSE 2-4 GM/100ML-% IV SOLN
2.0000 g | INTRAVENOUS | Status: AC
  Administered 2019-05-11: 2 g via INTRAVENOUS
  Filled 2019-05-11: qty 100

## 2019-05-11 MED ORDER — PROPOFOL 500 MG/50ML IV EMUL
INTRAVENOUS | Status: AC
  Filled 2019-05-11: qty 50

## 2019-05-11 MED ORDER — FENTANYL CITRATE (PF) 100 MCG/2ML IJ SOLN
INTRAMUSCULAR | Status: AC
  Filled 2019-05-11: qty 2

## 2019-05-11 MED ORDER — MIDAZOLAM HCL 5 MG/5ML IJ SOLN
INTRAMUSCULAR | Status: DC | PRN
  Administered 2019-05-11: 2 mg via INTRAVENOUS

## 2019-05-11 MED ORDER — ACETAMINOPHEN 500 MG PO TABS
1000.0000 mg | ORAL_TABLET | Freq: Once | ORAL | Status: AC
  Administered 2019-05-11: 1000 mg via ORAL
  Filled 2019-05-11: qty 2

## 2019-05-11 MED ORDER — ONDANSETRON HCL 4 MG/2ML IJ SOLN
INTRAMUSCULAR | Status: AC
  Filled 2019-05-11: qty 2

## 2019-05-11 MED ORDER — MIDAZOLAM HCL 2 MG/2ML IJ SOLN
INTRAMUSCULAR | Status: AC
  Filled 2019-05-11: qty 2

## 2019-05-11 MED ORDER — ACETAMINOPHEN 500 MG PO TABS
ORAL_TABLET | ORAL | Status: AC
  Filled 2019-05-11: qty 2

## 2019-05-11 MED ORDER — SODIUM CHLORIDE 0.9% FLUSH
3.0000 mL | Freq: Two times a day (BID) | INTRAVENOUS | Status: DC
  Filled 2019-05-11: qty 3

## 2019-05-11 SURGICAL SUPPLY — 33 items
BLADE HEX COATED 2.75 (ELECTRODE) ×3 IMPLANT
BLADE SURG 15 STRL LF DISP TIS (BLADE) ×1 IMPLANT
BLADE SURG 15 STRL SS (BLADE) ×2
CHLORAPREP W/TINT 26 (MISCELLANEOUS) ×3 IMPLANT
COVER BACK TABLE 60X90IN (DRAPES) ×3 IMPLANT
COVER MAYO STAND STRL (DRAPES) ×3 IMPLANT
COVER WAND RF STERILE (DRAPES) ×4 IMPLANT
DECANTER SPIKE VIAL GLASS SM (MISCELLANEOUS) ×2 IMPLANT
DERMABOND ADVANCED (GAUZE/BANDAGES/DRESSINGS) ×2
DERMABOND ADVANCED .7 DNX12 (GAUZE/BANDAGES/DRESSINGS) ×1 IMPLANT
DRAPE LAPAROTOMY TRNSV 102X78 (DRAPES) ×3 IMPLANT
DRAPE UTILITY XL STRL (DRAPES) ×3 IMPLANT
ELECT REM PT RETURN 9FT ADLT (ELECTROSURGICAL) ×3
ELECTRODE REM PT RTRN 9FT ADLT (ELECTROSURGICAL) ×1 IMPLANT
GAUZE SPONGE 4X4 12PLY STRL (GAUZE/BANDAGES/DRESSINGS) ×3 IMPLANT
GLOVE BIO SURGEON STRL SZ 6.5 (GLOVE) ×2 IMPLANT
GLOVE BIO SURGEON STRL SZ7 (GLOVE) ×2 IMPLANT
GLOVE BIO SURGEONS STRL SZ 6.5 (GLOVE) ×1
GLOVE BIOGEL PI IND STRL 7.0 (GLOVE) ×1 IMPLANT
GLOVE BIOGEL PI INDICATOR 7.0 (GLOVE) ×4
GOWN STRL REUS W/TWL 2XL LVL3 (GOWN DISPOSABLE) ×3 IMPLANT
GOWN STRL REUS W/TWL LRG LVL3 (GOWN DISPOSABLE) ×2 IMPLANT
NEEDLE HYPO 22GX1.5 SAFETY (NEEDLE) ×3 IMPLANT
NS IRRIG 500ML POUR BTL (IV SOLUTION) ×2 IMPLANT
PACK BASIN DAY SURGERY FS (CUSTOM PROCEDURE TRAY) ×3 IMPLANT
PENCIL BUTTON HOLSTER BLD 10FT (ELECTRODE) ×3 IMPLANT
SUT SILK 2 0 PERMA HAND 18 BK (SUTURE) ×2 IMPLANT
SUT VIC AB 3-0 SH 27 (SUTURE) ×2
SUT VIC AB 3-0 SH 27X BRD (SUTURE) ×1 IMPLANT
SUT VICRYL 4-0 PS2 18IN ABS (SUTURE) ×3 IMPLANT
SYR BULB IRRIGATION 50ML (SYRINGE) ×3 IMPLANT
SYR CONTROL 10ML LL (SYRINGE) ×3 IMPLANT
TOWEL OR 17X26 10 PK STRL BLUE (TOWEL DISPOSABLE) ×3 IMPLANT

## 2019-05-11 NOTE — Op Note (Signed)
05/11/2019  8:59 AM  PATIENT:  Jeremiah [name]  49 y.o. male  Patient Care Team: Jeanmarie Hubert, MD as PCP - General (Internal Medicine) Michel Bickers, MD as PCP - Infectious Diseases (Infectious Diseases) Charlott Rakes, MD as Consulting Physician (Family Medicine) Grace Isaac, MD as Consulting Physician (Cardiothoracic Surgery)  PRE-OPERATIVE DIAGNOSIS:  PORT IN PLACE  POST-OPERATIVE DIAGNOSIS:  PORT IN PLACE  PROCEDURE:  PARTIAL PORT REMOVAL   Surgeon(s): Leighton Ruff, MD  ASSISTANT: none   ANESTHESIA:   MAC  EBL:  65m  DRAINS: none   SPECIMEN:  No Specimen  DISPOSITION OF SPECIMEN:  N/A  COUNTS:  YES  PLAN OF CARE: Discharge to home after PACU  PATIENT DISPOSITION:  PACU - hemodynamically stable.  INDICATION: 49y.o. M with port in place for the last 2 years due to treatment for anal cancer.  He is here for removal.   OR FINDINGS: normal appearing port.  Catheter unable to be removed.  Point of stricture appears to be at jugular vein.  DESCRIPTION: the patient was identified in the preoperative holding area and taken to the OR where they were laid supine on the operating room table.  MAC anesthesia was induced without difficulty. SCDs were also noted to be in place prior to the initiation of anesthesia.  The patient was then prepped and draped in the usual sterile fashion.   A surgical timeout was performed indicating the correct patient, procedure, positioning and need for preoperative antibiotics.   I began by making an incision through his previous scar.  I dissected down to the level of the port.  The capsule above the port was opened and the scar tissue was removed from the neck of the port.  This allowed for the port to be removed from the pocket.  There did not appear to be any stay sutures to remove.  I placed a 3-0 Vicryl pursestring suture around the catheter opening and attempted to remove the catheter from the internal jugular vein.  The  catheter slid out about halfway and then stopped.  There appeared to be an obstruction at the internal jugular vein and I was unable to overcome this with gentle pressure.  I did not feel it was safe to proceed.  Since the port was halfway out, I transected the catheter with suture scissors and tacked the remaining catheter to the port cavity using 2, 2-0 silk sutures.  (Removed portion pictured below.)  The catheter was watertight and not leaking any blood.  I then closed the port capsule with a running 3-0 Vicryl suture and the skin with a running 4-0 Vicryl suture and Dermabond.  Patient tolerated this well and was awakened from anesthesia and sent to the postanesthesia care unit in stable condition.  All counts were correct per operating room staff.  Patient will be referred to vascular surgery for possible IJ cutdown and foreign body removal.

## 2019-05-11 NOTE — Anesthesia Postprocedure Evaluation (Signed)
Anesthesia Post Note  Patient: Jeremiah [name]  Procedure(s) Performed: PARTIAL PORT REMOVAL (N/A Chest)     Patient location during evaluation: PACU Anesthesia Type: MAC Level of consciousness: awake and alert Pain management: pain level controlled Vital Signs Assessment: post-procedure vital signs reviewed and stable Respiratory status: spontaneous breathing, nonlabored ventilation, respiratory function stable and patient connected to nasal cannula oxygen Cardiovascular status: stable and blood pressure returned to baseline Postop Assessment: no apparent nausea or vomiting Anesthetic complications: no    Last Vitals:  Vitals:   05/11/19 0941 05/11/19 1000  BP:  110/76  Pulse: 63 (!) 56  Resp: 13 14  Temp: 36.6 C 36.4 C  SpO2: 99% 100%    Last Pain:  Vitals:   05/11/19 1000  TempSrc:   PainSc: 0-No pain                 Tiajuana Amass

## 2019-05-11 NOTE — Telephone Encounter (Signed)
Attempted to call pt on both phone numbers provided. The home phone just gives a busy signal and I did leave a message on the cell phone for patient to call me back to schedule the foreign body retrieval of the cath portion of his PAC. JM

## 2019-05-11 NOTE — Anesthesia Procedure Notes (Signed)
Procedure Name: [place] Performed by: Nashly Olsson D, CRNA Oxygen Delivery Method: Simple face mask

## 2019-05-11 NOTE — H&P (Signed)
The patient is a 49 year old male who presents with anal lesions. 49 year old male with HIV who presents to the office approximately 2 years status post treatment of anal cancer with radiation and chemotherapy in [place]. It does not appear that he has had any follow-up for this. He did see Dr. Burr Medico, who has been doing surveillance CT scans. He did have a lung nodule which was biopsied by [name] and found to be granulomatous disease. He reports that he is asymptomatic. He occasionally has some blood on the toilet tissue when he has a large bowel movement. He also has a right-sided port that needs to be removed.   Past Surgical History (Tanisha A. Owens Shark, [place]; 03/28/2019 9:44 AM) Laparoscopic Inguinal Hernia Surgery Right.  Diagnostic Studies History (Tanisha A. Owens Shark, [place]; 03/28/2019 9:44 AM) Colonoscopy 1-5 years ago  Allergies (Tanisha A. Owens Shark, [place]; 03/28/2019 9:47 AM) Sulfa Antibiotics Allergies Reconciled  Medication History (Tanisha A. Owens Shark, [place]; 03/28/2019 9:47 AM) hydroCHLOROthiazide (25MG  Tablet, Oral) Active. Biktarvy (50-200-25MG  Tablet, Oral) Active. Medications Reconciled  Social History (Tanisha A. Owens Shark, [place]; 03/28/2019 9:44 AM) Alcohol use Moderate alcohol use. No caffeine use No drug use Tobacco use Current some day smoker.  Family History (Tanisha A. Owens Shark, [place]; 03/28/2019 9:44 AM) Diabetes Mellitus Brother, Father, Mother. Hypertension Brother, Father, Mother.  Other Problems (Tanisha A. Owens Shark, [place]; 03/28/2019 9:44 AM) Chronic Obstructive Lung Disease High blood pressure HIV-positive Inguinal Hernia Rectal Cancer     Review of Systems  General Not Present- Appetite Loss, Chills, Fatigue, Fever, Night Sweats, Weight Gain and Weight Loss. Skin Not Present- Change in Wart/Mole, Dryness, Hives, Jaundice, New Lesions, Non-Healing Wounds, Rash and Ulcer. HEENT Not Present- Earache, Hearing Loss, Hoarseness, Nose Bleed,  Oral Ulcers, Ringing in the Ears, Seasonal Allergies, Sinus Pain, Sore Throat, Visual Disturbances, Wears glasses/contact lenses and Yellow Eyes. Respiratory Not Present- Bloody sputum, Chronic Cough, Difficulty Breathing, Snoring and Wheezing. Breast Not Present- Breast Mass, Breast Pain, Nipple Discharge and Skin Changes. Cardiovascular Not Present- Chest Pain, Difficulty Breathing Lying Down, Leg Cramps, Palpitations, Rapid Heart Rate, Shortness of Breath and Swelling of Extremities. Gastrointestinal Not Present- Abdominal Pain, Bloating, Bloody Stool, Change in Bowel Habits, Chronic diarrhea, Constipation, Difficulty Swallowing, Excessive gas, Gets full quickly at meals, Hemorrhoids, Indigestion, Nausea, Rectal Pain and Vomiting. Male Genitourinary Not Present- Blood in Urine, Change in Urinary Stream, Frequency, Impotence, Nocturia, Painful Urination, Urgency and Urine Leakage. Musculoskeletal Not Present- Back Pain, Joint Pain, Joint Stiffness, Muscle Pain, Muscle Weakness and Swelling of Extremities. Neurological Not Present- Decreased Memory, Fainting, Headaches, Numbness, Seizures, Tingling, Tremor, Trouble walking and Weakness. Psychiatric Not Present- Anxiety, Bipolar, Change in Sleep Pattern, Depression, Fearful and Frequent crying. Endocrine Not Present- Cold Intolerance, Excessive Hunger, Hair Changes, Heat Intolerance, Hot flashes and New Diabetes. Hematology Not Present- Blood Thinners, Easy Bruising, Excessive bleeding, Gland problems, HIV and Persistent Infections.  BP 133/81   Pulse 65   Temp 97.8 F (36.6 C) (Oral)   Resp 16   Ht 5\' 11"  (1.803 m)   Wt 70.3 kg   SpO2 98%   BMI 21.60 kg/m     Physical Exam  General Mental Status-Alert. General Appearance-Not in acute distress. Build & Nutrition-Well nourished. Posture-Normal posture. Gait-Normal.  Head and Neck Head-normocephalic, atraumatic with no lesions or palpable  masses. Trachea-midline.  Chest and Lung Exam Chest and lung exam reveals -normal excursion with symmetric chest walls and quiet, even and easy respiratory effort with no use of accessory muscles.  Cardiovascular Cardiovascular examination  reveals -normal heart sounds, regular rate and rhythm with no murmurs and no digital clubbing, cyanosis, edema, increased warmth or tenderness.  Abdomen Inspection Inspection of the abdomen reveals - No Hernias. Palpation/Percussion Palpation and Percussion of the abdomen reveal - Soft, Non Tender, No Rigidity (guarding), No hepatosplenomegaly and No Palpable abdominal masses.  Rectal Note: Possible scar noted in the left anterior lateral region. No stricture noted. No external lesions noted.  Neurologic Neurologic evaluation reveals -alert and oriented x 3 with no impairment of recent or remote memory, normal attention span and ability to concentrate, normal sensation and normal coordination.  Musculoskeletal Normal Exam - Bilateral-Upper Extremity Strength Normal and Lower Extremity Strength Normal.  Lymphatic Femoral & Inguinal -Note:No lymphadenopathy noted bilaterally.     Assessment & Plan   HISTORY OF ANAL CANCER (Z85.048) Impression: 49 year old male with HIV who presents to the office for evaluation of his anal cancer. This was treated approximately 2 years ago. On exam today, he appears to have no sign of recurrent cancer. He does have a port in his right chest. We will plan on removing this in the operating room under sedation. Risk of bleeding and infection were discussed with patient. I believe he understands this and wishes to proceed with surgery.

## 2019-05-11 NOTE — Discharge Instructions (Addendum)
Post Anesthesia Home Care Instructions  Activity: Get plenty of rest for the remainder of the day. A responsible adult should stay with you for 24 hours following the procedure.  For the next 24 hours, DO NOT: -Drive a car -Paediatric nurse -Drink alcoholic beverages -Take any medication unless instructed by your physician -Make any legal decisions or sign important papers.  Meals: Start with liquid foods such as gelatin or soup. Progress to regular foods as tolerated. Avoid greasy, spicy, heavy foods. If nausea and/or vomiting occur, drink only clear liquids until the nausea and/or vomiting subsides. Call your physician if vomiting continues.  Special Instructions/Symptoms: Your throat may feel dry or sore from the anesthesia or the breathing tube placed in your throat during surgery. If this causes discomfort, gargle with warm salt water. The discomfort should disappear within 24 hours.  If you had a scopolamine patch placed behind your ear for the management of post- operative nausea and/or vomiting:  1. The medication in the patch is effective for 72 hours, after which it should be removed.  Wrap patch in a tissue and discard in the trash. Wash hands thoroughly with soap and water. 2. You may remove the patch earlier than 72 hours if you experience unpleasant side effects which may include dry mouth, dizziness or visual disturbances. 3. Avoid touching the patch. Wash your hands with soap and water after contact with the patch.   GENERAL SURGERY: POST OP INSTRUCTIONS  1. DIET: Follow a light bland diet the first 24 hours after arrival home, such as soup, liquids, crackers, etc.  Be sure to include lots of fluids daily.  Avoid fast food or heavy meals as your are more likely to get nauseated.   2. Take your usually prescribed home medications unless otherwise directed. 3. PAIN CONTROL: a. Pain is best controlled by a usual combination of three different methods  TOGETHER: i. Ice/Heat ii. Over the counter pain medication iii. Prescription pain medication b. Most patients will experience some swelling and bruising around the incisions.  Ice packs or heating pads (30-60 minutes up to 6 times a day) will help. Use ice for the first few days to help decrease swelling and bruising, then switch to heat to help relax tight/sore spots and speed recovery.  Some people prefer to use ice alone, heat alone, alternating between ice & heat.  Experiment to what works for you.  Swelling and bruising can take several weeks to resolve.   c. It is helpful to take an over-the-counter pain medication regularly for the first few weeks.  Choose one of the following that works best for you: i. Naproxen (Aleve, etc)  Two 220mg  tabs twice a day ii. Ibuprofen (Advil, etc) Three 200mg  tabs four times a day (every meal & bedtime)  4. Avoid getting constipated.  Between the surgery and the pain medications, it is common to experience some constipation.  Increasing fluid intake and taking a fiber supplement (such as Metamucil, Citrucel, FiberCon, MiraLax, etc) 1-2 times a day regularly will usually help prevent this problem from occurring.  A mild laxative (prune juice, Milk of Magnesia, MiraLax, etc) should be taken according to package directions if there are no bowel movements after 48 hours.   5. Wash / shower every day.  You may shower over the dressings as they are waterproof.  Continue to shower over incision(s) after the dressing is off. 6. Remove your waterproof bandages 5 days after surgery.  You may leave the incision open to air.  You may have skin tapes (Steri Strips) covering the incision(s).  Leave them on until one week, then remove.  You may replace a dressing/Band-Aid to cover the incision for comfort if you wish.      7. ACTIVITIES as tolerated:   a. You may resume regular (light) daily activities beginning the next day--such as daily self-care, walking, climbing  stairs--gradually increasing activities as tolerated.  If you can walk 30 minutes without difficulty, it is safe to try more intense activity such as jogging, treadmill, bicycling, low-impact aerobics, swimming, etc. b. Save the most intensive and strenuous activity for last such as sit-ups, heavy lifting, contact sports, etc  Refrain from any heavy lifting or straining until you are off narcotics for pain control.   c. DO NOT PUSH THROUGH PAIN.  Let pain be your guide: If it hurts to do something, don't do it.  Pain is your body warning you to avoid that activity for another week until the pain goes down. d. You may drive when you are no longer taking prescription pain medication, you can comfortably wear a seatbelt, and you can safely maneuver your car and apply brakes. e. Dennis Bast may have sexual intercourse when it is comfortable.  8. FOLLOW UP in our office a. Please call [facility] at [phone] to set up an appointment to see your surgeon in the office for a follow-up appointment approximately 2-3 weeks after your surgery. b. Make sure that you call for this appointment the day you arrive home to insure a convenient appointment time. 9. IF YOU HAVE DISABILITY OR FAMILY LEAVE FORMS, BRING THEM TO THE OFFICE FOR PROCESSING.  DO NOT GIVE THEM TO YOUR DOCTOR.   WHEN TO CALL us [phone]: 1. Poor pain control 2. Reactions / problems with new medications (rash/itching, nausea, etc)  3. Fever over 101.5 F (38.5 C) 4. Worsening swelling or bruising 5. Continued bleeding from incision. 6. Increased pain, redness, or drainage from the incision   The clinic staff is available to answer your questions during regular business hours (8:30am-5pm).  Please don't hesitate to call and ask to speak to one of our nurses for clinical concerns.   If you have a medical emergency, go to the nearest emergency room or call 911.  A surgeon from [facility] is always on call at the [facility], [place], [place], [place], [place]  [id number] ? MAIN: [phone] ? TOLL FREE: [phone] ?  FAX [phone] [email or web address]

## 2019-05-11 NOTE — Transfer of Care (Signed)
Immediate Anesthesia Transfer of Care Note  Patient: Jeremiah [name]  Procedure(s) Performed: PORT REMOVAL (N/A Chest)  Patient Location: PACU  Anesthesia Type:MAC  Level of Consciousness: awake, alert  and oriented  Airway & Oxygen Therapy: Patient Spontanous Breathing and Patient connected to face mask oxygen  Post-op Assessment: Report given to RN and Post -op Vital signs reviewed and stable  Post vital signs: Reviewed and stable  Last Vitals:  Vitals Value Taken Time  BP 87/58 05/11/19 0908  Temp 36.6 C 05/11/19 0908  Pulse 71 05/11/19 0908  Resp 15 05/11/19 0908  SpO2 100 % 05/11/19 0908    Last Pain:  Vitals:   05/11/19 0908  TempSrc: Oral  PainSc:       Patients Stated Pain Goal: 5 (06/01/13 5208)  Complications: No apparent anesthesia complications

## 2019-05-12 ENCOUNTER — Telehealth (HOSPITAL_COMMUNITY): Payer: Self-pay | Admitting: Radiology

## 2019-05-12 ENCOUNTER — Other Ambulatory Visit: Payer: Self-pay | Admitting: General Surgery

## 2019-05-12 DIAGNOSIS — S20359A Superficial foreign body of unspecified front wall of thorax, initial encounter: Secondary | ICD-10-CM

## 2019-05-12 NOTE — Telephone Encounter (Signed)
Pt returned my call. I tried to schedule his foreign body retrieval but patient had multiple questions and wanted to schedule an office visit at clinic with and IR doctor before proceeding. Transferred pt to [facility] [place]

## 2019-05-17 ENCOUNTER — Other Ambulatory Visit: Payer: Self-pay

## 2019-05-17 ENCOUNTER — Encounter: Payer: Self-pay | Admitting: *Deleted

## 2019-05-17 ENCOUNTER — Ambulatory Visit
Admission: RE | Admit: 2019-05-17 | Discharge: 2019-05-17 | Disposition: A | Discharge status: Home / Self Care | Payer: Medicaid Other | Source: Ambulatory Visit | Attending: General Surgery | Admitting: General Surgery

## 2019-05-17 DIAGNOSIS — Z452 Encounter for adjustment and management of vascular access device: Secondary | ICD-10-CM | POA: Diagnosis not present

## 2019-05-17 DIAGNOSIS — S20359A Superficial foreign body of unspecified front wall of thorax, initial encounter: Secondary | ICD-10-CM

## 2019-05-17 HISTORY — PX: IR RADIOLOGIST EVAL & MGMT: IMG5224

## 2019-05-17 NOTE — Consult Note (Signed)
Chief Complaint: Incomplete removal of portacatheter  Referring Physician(s): Thomas,Alicia  History of Present Illness: Amaru Bynum is a 49 y.o. male with past medical history significant for anal cancer who underwent attempted surgical removal of indwelling portacatheter on 05/10/2018.  Unfortunately, only the peripheral aspect of the portacatheter was able to be removed secondary to presumed scarring at the jugular access site.  As such, request made for image guided removal of the remainder of the portacatheter fragment.  This had been attempted to be scheduled in an expeditious manner however patient had multiple questions regarding the procedure and as such, a teleconference appointment was scheduled for today to answer any of the patient's questions or concerns.  The portacatheter was initially placed approximately 3 years ago in [place] (per patient report).  Past Medical History:  Diagnosis Date  . Anal cancer ([place])   . Anal warts   . Carpal tunnel syndrome    Right  . COPD (chronic obstructive pulmonary disease) ([place])   . Depression   . Hemorrhoids 02/25/2015  . History of tuberculin skin testing within last year 2014  . HIV (human immunodeficiency virus infection) ([place])   . Hypertension   . Latent syphilis   . Lung nodule    Right upper lobe  . Pneumonia 2007   x2  . Pneumothorax 07/01/2018   LEFT  . Rectal cancer [facility])     Past Surgical History:  Procedure Laterality Date  . CHEST TUBE INSERTION ([place] HX) Left 06/30/2018  . CONDYLOMA EXCISION/FULGURATION    . HERNIA REPAIR  1991   Abdominal   . PORT-A-CATH REMOVAL N/A 05/11/2019   Procedure: PARTIAL PORT REMOVAL;  Surgeon: Leighton Ruff, MD;  Location: [place];  Service: General;  Laterality: N/A;  . unilateral orchiectomy    . VIDEO ASSISTED THORACOSCOPY (VATS)/WEDGE RESECTION Right 06/23/2018   Procedure: VIDEO ASSISTED THORACOSCOPY (VATS)/LUNG RESECTION,  stapling and dissection of apical bleb, wedge resection of right upper lobe mass, lymph node dissection, intercostal nerve block;  Surgeon: Grace Isaac, MD;  Location: [place];  Service: Thoracic;  Laterality: Right;  Marland Kitchen VIDEO BRONCHOSCOPY N/A 06/23/2018   Procedure: VIDEO BRONCHOSCOPY;  Surgeon: Grace Isaac, MD;  Location: [facility] OR;  Service: Thoracic;  Laterality: N/A;    Allergies: Nsaids and Sulfa antibiotics  Medications: Prior to Admission medications   Medication Sig Start Date End Date Taking? Authorizing Provider  albuterol (VENTOLIN HFA) 108 (90 Base) MCG/ACT inhaler Inhale 1-2 puffs into the lungs every 6 (six) hours as needed for wheezing or shortness of breath. 10/25/18   Lauraine Rinne, NP  bictegravir-emtricitabine-tenofovir AF (BIKTARVY) 50-200-25 MG TABS tablet Take 1 tablet by mouth daily. Patient taking differently: Take 1 tablet by mouth daily.  09/29/18   Michel Bickers, MD  hydrochlorothiazide (HYDRODIURIL) 25 MG tablet TAKE 1 TABLET(25 MG) BY MOUTH DAILY 04/26/19   Jeanmarie Hubert, MD  Multiple Vitamin (MULTI-VITAMIN) tablet Take by mouth.    [provider]  nicotine (NICODERM CQ - DOSED IN MG/24 HOURS) 14 mg/24hr patch Place 1 patch (14 mg total) onto the skin daily. Patient not taking: Reported on 05/06/2019 04/05/19   Marshell Garfinkel, MD  varenicline (CHANTIX PAK) 0.5 MG X 11 & 1 MG X 42 tablet Take one 0.5 mg tablet by mouth once daily for 3 days, then increase to one 0.5 mg tablet twice daily for 4 days, then increase to one 1 mg tablet twice daily. Patient not taking: Reported on 05/06/2019 04/05/19  Marshell Garfinkel, MD     Family History  Problem Relation Age of Onset  . Diabetes Mother   . Hypertension Mother   . Cancer Mother        breast  . Diabetes Father   . Hypertension Father     Social History   Socioeconomic History  . Marital status: Single    Spouse name: Not on file  . Number of children: Not on file  . Years of education: Not  on file  . Highest education level: Not on file  Occupational History  . Not on file  Tobacco Use  . Smoking status: Current Every Day Smoker    Packs/day: 0.10    Years: 27.00    Pack years: 2.70    Types: Cigars  . Smokeless tobacco: Never Used  . Tobacco comment: occassionally  Substance and Sexual Activity  . Alcohol use: Yes    Alcohol/week: 0.0 standard drinks    Comment: less than 40 oz a week  . Drug use: Not Currently    Comment: Past history of crack cocaine  use   . Sexual activity: Not Currently    Partners: Male    Birth control/protection: Condom    Comment: declined condoms 03/2019  Other Topics Concern  . Not on file  Social History Narrative  . Not on file   Social Determinants of Health   Financial Resource Strain:   . Difficulty of Paying Living Expenses: Not on file  Food Insecurity:   . Worried About Charity fundraiser in the Last Year: Not on file  . Ran Out of Food in the Last Year: Not on file  Transportation Needs:   . Lack of Transportation (Medical): Not on file  . Lack of Transportation (Non-Medical): Not on file  Physical Activity:   . Days of Exercise per Week: Not on file  . Minutes of Exercise per Session: Not on file  Stress:   . Feeling of Stress : Not on file  Social Connections:   . Frequency of Communication with Friends and Family: Not on file  . Frequency of Social Gatherings with Friends and Family: Not on file  . Attends Religious Services: Not on file  . Active Member of Clubs or Organizations: Not on file  . Attends Archivist Meetings: Not on file  . Marital Status: Not on file    ECOG Status: 0 - Asymptomatic  Review of Systems  Review of Systems: A 12 point ROS discussed and pertinent positives are indicated in the HPI above.  All other systems are negative.  Physical Exam No direct physical exam was performed (except for noted visual exam findings with Video Visits).   Vital Signs: There were no  vitals taken for this visit.  Imaging:  Chest CT - 03/24/2019  Labs:  CBC: Recent Labs    06/30/18 1445 06/30/18 1445 07/05/18 0747 08/15/18 2126 03/08/19 1110 05/11/19 0659  WBC 9.3  --  10.9* 3.1* 4.6  --   HGB 14.0   < > 11.4* 13.5 14.4 15.0  HCT 41.2   < > 32.9* 39.6 41.4 44.0  PLT 131*  --  157 163 146  --    < > = values in this interval not displayed.    COAGS: Recent Labs    06/21/18 1323 07/05/18 0747  INR 1.0 1.0  APTT 27  --     BMP: Recent Labs    07/07/18 0228 07/07/18 0228 08/15/18 2126 03/08/19  1110 03/24/19 1328 05/11/19 0659  NA 134*   < > 142 138 139 139  K 3.9   < > 3.8 4.7 4.3 4.6  CL 99   < > 111 102 102 104  CO2 29  --  26 28 25   --   GLUCOSE 98   < > 88 91 86 92  BUN 5*   < > 10 15 17 11   CALCIUM 8.8*  --  8.7* 9.4 9.2  --   CREATININE 0.87   < > 1.08 1.08 1.18 1.00  GFRNONAA >60  --  >60 81 73  --   GFRAA >60  --  >60 94 84  --    < > = values in this interval not displayed.    LIVER FUNCTION TESTS: Recent Labs    06/25/18 0549 06/25/18 0549 06/29/18 0628 07/02/18 0302 08/15/18 2126 03/08/19 1110  BILITOT 1.1   < > 1.8* 1.6* 0.9 0.9  AST 87*   < > 263* 97* 30 33  ALT 50*   < > 288* 170* 27 19  ALKPHOS 45  --  68 70 57  --   PROT 5.9*   < > 6.2* 5.6* 7.2 6.3  ALBUMIN 3.7  --  3.7 3.2* 4.1  --    < > = values in this interval not displayed.    TUMOR MARKERS: No results for input(s): AFPTM, CEA, CA199, CHROMGRNA in the last 8760 hours.   Operative radiograph:   Assessment and Plan:  Jeremiah [name] is a 49 y.o. male with past medical history significant for anal cancer who underwent attempted surgical removal of indwelling portacatheter on 05/10/2018.  Unfortunately, only the peripheral aspect of the portacatheter was able to be removed secondary to presumed scarring at the jugular access site.  As such, request made for image guided removal of the remainder of the portacatheter fragment.  This had been attempted  to be scheduled in an expeditious manner however patient had multiple questions regarding the procedure and as such, a teleconference appointment was scheduled for today to answer any of the patient's questions or concerns.  The portacatheter was initially placed approximately 3 years ago in [place] (per patient report).  Benefits and risks (including but not limited to radiation and contrast exposure, inability to remove the remainder of the portacatheter tubing, foreign body embolization, cardiac arrhythmia and/or bleeding) of portacatheter fragment removal was discussed at length with the patient.    I explained that I most likely will attempt to retrieve the portacatheter fragment from a right femoral approach with a snare device though ultimately the procedure may require a limited cutdown at the jugular venotomy site.    I explained the predominant risk with leaving the foreign body would be fragment embolization, vessel thrombosis and/or infectious seeding.  Following this prolonged and detailed conversation, the patient is appropriately apprehensive, though does wish to pursue attempted image guided retrieval.  As such, this procedure will be scheduled on an outpatient basis at [facility] this Friday, January 29th.  Thank you for this interesting consult.  I greatly enjoyed meeting Tahjae Ruppel and look forward to participating in their care.  A copy of this report was sent to the requesting provider on this date.  Electronically Signed: Sandi Mariscal 05/17/2019, 2:39 PM   I spent a total of 15 Minutes in remote  clinical consultation, greater than 50% of which was counseling/coordinating care for image guided portacatheter fragment removal.    Visit  type: Audio only (telephone). Audio (no video) only due to patient's lack of internet/smartphone capability. Alternative for in-person consultation at [facility], [place] [address] [place], [place],  [place]. This visit type was conducted due to national recommendations for restrictions regarding the COVID-19 Pandemic (e.g. social distancing).  This format is felt to be most appropriate for this patient at this time.  All issues noted in this document were discussed and addressed.

## 2019-05-18 ENCOUNTER — Other Ambulatory Visit: Payer: Self-pay | Admitting: Radiology

## 2019-05-18 ENCOUNTER — Ambulatory Visit: Payer: Medicaid Other

## 2019-05-20 ENCOUNTER — Ambulatory Visit (INDEPENDENT_AMBULATORY_CARE_PROVIDER_SITE_OTHER): Payer: Medicaid Other | Admitting: Internal Medicine

## 2019-05-20 ENCOUNTER — Other Ambulatory Visit (HOSPITAL_COMMUNITY): Payer: Self-pay | Admitting: General Surgery

## 2019-05-20 ENCOUNTER — Other Ambulatory Visit: Payer: Self-pay

## 2019-05-20 ENCOUNTER — Ambulatory Visit (HOSPITAL_COMMUNITY)
Admission: RE | Admit: 2019-05-20 | Discharge: 2019-05-20 | Disposition: A | Discharge status: Home / Self Care | Payer: Medicaid Other | Source: Ambulatory Visit | Attending: General Surgery | Admitting: General Surgery

## 2019-05-20 DIAGNOSIS — I10 Essential (primary) hypertension: Secondary | ICD-10-CM | POA: Insufficient documentation

## 2019-05-20 DIAGNOSIS — Z803 Family history of malignant neoplasm of breast: Secondary | ICD-10-CM | POA: Insufficient documentation

## 2019-05-20 DIAGNOSIS — X58XXXA Exposure to other specified factors, initial encounter: Secondary | ICD-10-CM

## 2019-05-20 DIAGNOSIS — Z8249 Family history of ischemic heart disease and other diseases of the circulatory system: Secondary | ICD-10-CM | POA: Diagnosis not present

## 2019-05-20 DIAGNOSIS — Z23 Encounter for immunization: Secondary | ICD-10-CM

## 2019-05-20 DIAGNOSIS — S91105A Unspecified open wound of left lesser toe(s) without damage to nail, initial encounter: Secondary | ICD-10-CM | POA: Diagnosis not present

## 2019-05-20 DIAGNOSIS — Z1889 Other specified retained foreign body fragments: Secondary | ICD-10-CM | POA: Diagnosis not present

## 2019-05-20 DIAGNOSIS — F17210 Nicotine dependence, cigarettes, uncomplicated: Secondary | ICD-10-CM | POA: Insufficient documentation

## 2019-05-20 DIAGNOSIS — Z85048 Personal history of other malignant neoplasm of rectum, rectosigmoid junction, and anus: Secondary | ICD-10-CM

## 2019-05-20 DIAGNOSIS — G5601 Carpal tunnel syndrome, right upper limb: Secondary | ICD-10-CM | POA: Insufficient documentation

## 2019-05-20 DIAGNOSIS — Z452 Encounter for adjustment and management of vascular access device: Secondary | ICD-10-CM | POA: Diagnosis not present

## 2019-05-20 DIAGNOSIS — J449 Chronic obstructive pulmonary disease, unspecified: Secondary | ICD-10-CM | POA: Diagnosis not present

## 2019-05-20 DIAGNOSIS — Z21 Asymptomatic human immunodeficiency virus [HIV] infection status: Secondary | ICD-10-CM

## 2019-05-20 DIAGNOSIS — Z79899 Other long term (current) drug therapy: Secondary | ICD-10-CM | POA: Diagnosis not present

## 2019-05-20 DIAGNOSIS — M79672 Pain in left foot: Secondary | ICD-10-CM

## 2019-05-20 HISTORY — PX: IR TRANSCATH RETRIEVAL FB INCL GUIDANCE (MS): IMG5375

## 2019-05-20 HISTORY — PX: IR RADIOLOGY PERIPHERAL GUIDED IV START: IMG5598

## 2019-05-20 HISTORY — PX: IR US GUIDE VASC ACCESS RIGHT: IMG2390

## 2019-05-20 MED ORDER — IOHEXOL 300 MG/ML  SOLN: 50.0000 mL | Freq: Once | INTRAMUSCULAR | Status: DC | PRN

## 2019-05-20 MED ORDER — SODIUM CHLORIDE 0.9 % IV SOLN: INTRAVENOUS | Status: DC

## 2019-05-20 MED ORDER — CEFAZOLIN SODIUM-DEXTROSE 2-4 GM/100ML-% IV SOLN: 2.0000 g | INTRAVENOUS | Status: DC

## 2019-05-20 MED ORDER — DOXYCYCLINE MONOHYDRATE 100 MG PO TABS: 100.0000 mg | ORAL_TABLET | Freq: Two times a day (BID) | ORAL | 0 refills | Status: DC

## 2019-05-20 MED ORDER — MIDAZOLAM HCL 2 MG/2ML IJ SOLN
INTRAMUSCULAR | Status: AC | PRN
  Administered 2019-05-20: 1 mg via INTRAVENOUS

## 2019-05-20 MED ORDER — FENTANYL CITRATE (PF) 100 MCG/2ML IJ SOLN
INTRAMUSCULAR | Status: AC | PRN
  Administered 2019-05-20: 50 ug via INTRAVENOUS

## 2019-05-20 MED ORDER — LIDOCAINE HCL (PF) 1 % IJ SOLN
INTRAMUSCULAR | Status: AC | PRN
  Administered 2019-05-20: 5 mL

## 2019-05-20 MED ORDER — FENTANYL CITRATE (PF) 100 MCG/2ML IJ SOLN
INTRAMUSCULAR | Status: AC
  Filled 2019-05-20: qty 2

## 2019-05-20 MED ORDER — LIDOCAINE HCL 1 % IJ SOLN
INTRAMUSCULAR | Status: AC
  Filled 2019-05-20: qty 20

## 2019-05-20 MED ORDER — MIDAZOLAM HCL 2 MG/2ML IJ SOLN
INTRAMUSCULAR | Status: AC
  Filled 2019-05-20: qty 2

## 2019-05-20 NOTE — H&P (Signed)
Chief Complaint: Patient was seen in consultation today for fractured Port-A-Cath  Referring Physician(s): Thomas,Alicia  Supervising Physician: Sandi Mariscal  Patient Status: [facility] - Out-pt  History of Present Illness: Jeremiah [name] is a 49 y.o. male with past medical history of COPD, depression, HIV, latent syphilis, anal/rectal cancer s/p treatment via R IJ Port-A-Cath placed ~3 years ago at [facility].  Patient underwent attempt at Port-A-Cath removal in the OR 05/11/19, however the catheter was fractured due to presumed scarring at the jugular site and the internal portion of the catheter could not be removed.  Patient referred to IR for foreign body removal. He was Jeremiah to meet in virtual conference with Dr. Pascal Lux 05/17/19.  Plan was made for attempt at fragment removal today.   Patient presents to [facility] today.  He is understandably nervous about the procedure today.  He denies fever, chills, nausea, vomiting, abdominal pain, dysuria.  His surgical removal site appear well-healed.  There is potential remaining tubing within the chest wall.  He understands the goals of the procedure today and is agreeable to proceed.  He has been NPO.  He does not take blood thinners.   Past Medical History:  Diagnosis Date  . Anal cancer ([place])   . Anal warts   . Carpal tunnel syndrome    Right  . COPD (chronic obstructive pulmonary disease) ([place])   . Depression   . Hemorrhoids 02/25/2015  . History of tuberculin skin testing within last year 2014  . HIV (human immunodeficiency virus infection) ([place])   . Hypertension   . Latent syphilis   . Lung nodule    Right upper lobe  . Pneumonia 2007   x2  . Pneumothorax 07/01/2018   LEFT  . Rectal cancer [facility])     Past Surgical History:  Procedure Laterality Date  . CHEST TUBE INSERTION ([place] HX) Left 06/30/2018  . CONDYLOMA EXCISION/FULGURATION    . HERNIA REPAIR  1991   Abdominal   . IR RADIOLOGIST EVAL & MGMT  05/17/2019  . PORT-A-CATH  REMOVAL N/A 05/11/2019   Procedure: PARTIAL PORT REMOVAL;  Surgeon: Leighton Ruff, MD;  Location: [facility];  Service: General;  Laterality: N/A;  . unilateral orchiectomy    . VIDEO ASSISTED THORACOSCOPY (VATS)/WEDGE RESECTION Right 06/23/2018   Procedure: VIDEO ASSISTED THORACOSCOPY (VATS)/LUNG RESECTION, stapling and dissection of apical bleb, wedge resection of right upper lobe mass, lymph node dissection, intercostal nerve block;  Surgeon: Grace Isaac, MD;  Location: [place];  Service: Thoracic;  Laterality: Right;  Marland Kitchen VIDEO BRONCHOSCOPY N/A 06/23/2018   Procedure: VIDEO BRONCHOSCOPY;  Surgeon: Grace Isaac, MD;  Location: [facility] OR;  Service: Thoracic;  Laterality: N/A;    Allergies: Nsaids and Sulfa antibiotics  Medications: Prior to Admission medications   Medication Sig Start Date End Date Taking? Authorizing Provider  albuterol (VENTOLIN HFA) 108 (90 Base) MCG/ACT inhaler Inhale 1-2 puffs into the lungs every 6 (six) hours as needed for wheezing or shortness of breath. 10/25/18   Lauraine Rinne, NP  bictegravir-emtricitabine-tenofovir AF (BIKTARVY) 50-200-25 MG TABS tablet Take 1 tablet by mouth daily. Patient taking differently: Take 1 tablet by mouth daily.  09/29/18   Michel Bickers, MD  doxycycline (ADOXA) 100 MG tablet Take 1 tablet (100 mg total) by mouth 2 (two) times daily for 7 days. 05/20/19 05/27/19  Marianna Payment, MD  hydrochlorothiazide (HYDRODIURIL) 25 MG tablet TAKE 1 TABLET(25 MG) BY MOUTH DAILY 04/26/19   Jeanmarie Hubert, MD  Multiple Vitamin (  MULTI-VITAMIN) tablet Take by mouth.    [provider]  nicotine (NICODERM CQ - DOSED IN MG/24 HOURS) 14 mg/24hr patch Place 1 patch (14 mg total) onto the skin daily. Patient not taking: Reported on 05/06/2019 04/05/19   Marshell Garfinkel, MD  varenicline (CHANTIX PAK) 0.5 MG X 11 & 1 MG X 42 tablet Take one 0.5 mg tablet by mouth once daily for 3 days, then increase to one 0.5 mg tablet twice daily for 4  days, then increase to one 1 mg tablet twice daily. Patient not taking: Reported on 05/06/2019 04/05/19   Marshell Garfinkel, MD     Family History  Problem Relation Age of Onset  . Diabetes Mother   . Hypertension Mother   . Cancer Mother        breast  . Diabetes Father   . Hypertension Father     Social History   Socioeconomic History  . Marital status: Single    Spouse name: Not on file  . Number of children: Not on file  . Years of education: Not on file  . Highest education level: Not on file  Occupational History  . Not on file  Tobacco Use  . Smoking status: Current Every Day Smoker    Packs/day: 0.10    Years: 27.00    Pack years: 2.70    Types: Cigars  . Smokeless tobacco: Never Used  . Tobacco comment: couple cigars per day  Substance and Sexual Activity  . Alcohol use: Yes    Alcohol/week: 0.0 standard drinks    Comment: less than 40 oz a week  . Drug use: Not Currently    Comment: Past history of crack cocaine  use   . Sexual activity: Not Currently    Partners: Male    Birth control/protection: Condom    Comment: declined condoms 03/2019  Other Topics Concern  . Not on file  Social History Narrative  . Not on file   Social Determinants of Health   Financial Resource Strain:   . Difficulty of Paying Living Expenses: Not on file  Food Insecurity:   . Worried About Charity fundraiser in the Last Year: Not on file  . Ran Out of Food in the Last Year: Not on file  Transportation Needs:   . Lack of Transportation (Medical): Not on file  . Lack of Transportation (Non-Medical): Not on file  Physical Activity:   . Days of Exercise per Week: Not on file  . Minutes of Exercise per Session: Not on file  Stress:   . Feeling of Stress : Not on file  Social Connections:   . Frequency of Communication with Friends and Family: Not on file  . Frequency of Social Gatherings with Friends and Family: Not on file  . Attends Religious Services: Not on file  .  Active Member of Clubs or Organizations: Not on file  . Attends Archivist Meetings: Not on file  . Marital Status: Not on file     Review of Systems: A 12 point ROS discussed and pertinent positives are indicated in the HPI above.  All other systems are negative.  Review of Systems  Constitutional: Negative for fatigue and fever.  Respiratory: Negative for cough and shortness of breath.   Cardiovascular: Negative for chest pain.  Gastrointestinal: Negative for abdominal pain.  Genitourinary: Negative for dysuria.  Musculoskeletal: Negative for back pain.  Psychiatric/Behavioral: Negative for behavioral problems and confusion.    Vital Signs: BP Marland Kitchen)  122/96 (BP Location: Left Arm)   Pulse (!) 50   Temp 98.4 F (36.9 C) (Oral)   Resp 18   Ht '5\' 11"'  (1.803 m)   Wt 153 lb (69.4 kg)   SpO2 100%   BMI 21.34 kg/m   Physical Exam Vitals and nursing note reviewed.  Constitutional:      Appearance: Normal appearance.  HENT:     Mouth/Throat:     Mouth: Mucous membranes are moist.     Pharynx: Oropharynx is clear.  Cardiovascular:     Rate and Rhythm: Normal rate and regular rhythm.  Pulmonary:     Effort: Pulmonary effort is normal. No respiratory distress.     Breath sounds: Normal breath sounds.  Abdominal:     General: Abdomen is flat.     Palpations: Abdomen is soft.  Skin:    General: Skin is warm and dry.  Neurological:     General: No focal deficit present.     Mental Status: He is alert and oriented to person, place, and time. Mental status is at baseline.  Psychiatric:        Mood and Affect: Mood normal.        Behavior: Behavior normal.        Thought Content: Thought content normal.        Judgment: Judgment normal.      MD Evaluation Airway: WNL Heart: WNL Abdomen: WNL Chest/ Lungs: WNL ASA  Classification: 3 Mallampati/Airway Score: One   Imaging: IR Radiologist Eval & Mgmt  Result Date: 05/17/2019 Please refer to notes tab for  details about interventional procedure. (Op Note)   Labs:  CBC: Recent Labs    06/30/18 1445 06/30/18 1445 07/05/18 0747 08/15/18 2126 03/08/19 1110 05/11/19 0659  WBC 9.3  --  10.9* 3.1* 4.6  --   HGB 14.0   < > 11.4* 13.5 14.4 15.0  HCT 41.2   < > 32.9* 39.6 41.4 44.0  PLT 131*  --  157 163 146  --    < > = values in this interval not displayed.    COAGS: Recent Labs    06/21/18 1323 07/05/18 0747  INR 1.0 1.0  APTT 27  --     BMP: Recent Labs    07/07/18 0228 07/07/18 0228 08/15/18 2126 03/08/19 1110 03/24/19 1328 05/11/19 0659  NA 134*   < > 142 138 139 139  K 3.9   < > 3.8 4.7 4.3 4.6  CL 99   < > 111 102 102 104  CO2 29  --  '26 28 25  ' --   GLUCOSE 98   < > 88 91 86 92  BUN 5*   < > '10 15 17 11  ' CALCIUM 8.8*  --  8.7* 9.4 9.2  --   CREATININE 0.87   < > 1.08 1.08 1.18 1.00  GFRNONAA >60  --  >60 81 73  --   GFRAA >60  --  >60 94 84  --    < > = values in this interval not displayed.    LIVER FUNCTION TESTS: Recent Labs    06/25/18 0549 06/25/18 0549 06/29/18 0628 07/02/18 0302 08/15/18 2126 03/08/19 1110  BILITOT 1.1   < > 1.8* 1.6* 0.9 0.9  AST 87*   < > 263* 97* 30 33  ALT 50*   < > 288* 170* 27 19  ALKPHOS 45  --  68 70 57  --   PROT 5.9*   < >  6.2* 5.6* 7.2 6.3  ALBUMIN 3.7  --  3.7 3.2* 4.1  --    < > = values in this interval not displayed.    TUMOR MARKERS: No results for input(s): AFPTM, CEA, CA199, CHROMGRNA in the last 8760 hours.  Assessment and Plan: Patient with past medical history of anal/rectal cancer s/p treatment completion and attempted Port-A-Cath removal presents for foreign body removal at the request of Dr. Marcello Moores.  Case reviewed by Dr. Pascal Lux who approves patient for procedure and has met in consultation with the patient.  Patient presents today in their usual state of health.  He has been NPO and is not currently on blood thinners.   Risks and benefits discussed with the patient including, but not limited to  bleeding, infection, contrast induced renal failure, foreign body fracture or migration, damage or irritation to adjacent structures..  All of the patient's questions were answered, patient is agreeable to proceed. Consent signed and in chart.  Thank you for this interesting consult.  I greatly enjoyed meeting Tymeir Weathington and look forward to participating in their care.  A copy of this report was sent to the requesting provider on this date.  Electronically Signed: Docia Barrier, PA 05/20/2019, 11:41 AM   I spent a total of  30 Minutes   in face to face in clinical consultation, greater than 50% of which was counseling/coordinating care for foreign body removal.

## 2019-05-20 NOTE — Progress Notes (Signed)
Per Whitney,RN in radiology, start IV and send to radiology

## 2019-05-20 NOTE — Procedures (Signed)
Pre Procedural Dx: Retained port-a-cath tubing Post Procedural Dx: Same  Successful removal of the remainder of the port-a-catheter tubing.  EBL: Minimal No immediate post procedural complications.   Ronny Bacon, MD Pager #: [phone]

## 2019-05-20 NOTE — Discharge Instructions (Signed)
Implanted [place], Care After This sheet gives you information about how to care for yourself after your procedure. Your health care provider may also give you more specific instructions. If you have problems or questions, contact your health care provider. What can I expect after the procedure? After the procedure, it is common to have:  Soreness or pain near your incision.  Some swelling or bruising near your incision. Follow these instructions at home: Medicines  Take over-the-counter and prescription medicines only as told by your health care provider.  If you were prescribed an antibiotic medicine, take it as told by your health care provider. Do not stop taking the antibiotic even if you start to feel better. Bathing  Do not take baths, swim, or use a hot tub until your health care provider approves. Ask your health care provider if you can take showers. You may only be allowed to take sponge baths. Incision care   Follow instructions from your health care provider about how to take care of your incision. Make sure you: ? Wash your hands with soap and water before you change your bandage (dressing). If soap and water are not available, use hand sanitizer. ? Change your dressing as told by your health care provider. ? Keep your dressing dry. ? Leave stitches (sutures), skin glue, or adhesive strips in place. These skin closures may need to stay in place for 2 weeks or longer. If adhesive strip edges start to loosen and curl up, you may trim the loose edges. Do not remove adhesive strips completely unless your health care provider tells you to do that.  Check your incision area every day for signs of infection. Check for: ? More redness, swelling, or pain. ? More fluid or blood. ? Warmth. ? Pus or a bad smell. Driving   Do not drive for 24 hours if you were given a medicine to help you relax (sedative) during your procedure.  If you did not receive a sedative, ask your  health care provider when it is safe to drive. Activity  Return to your normal activities as told by your health care provider. Ask your health care provider what activities are safe for you.  Do not lift anything that is heavier than 10 lb (4.5 kg), or the limit that you are told, until your health care provider says that it is safe.  Do not do activities that involve lifting your arms over your head. General instructions  Do not use any products that contain nicotine or tobacco, such as cigarettes and e-cigarettes. These can delay healing. If you need help quitting, ask your health care provider.  Keep all follow-up visits as told by your health care provider. This is important. Contact a health care provider if:  You have more redness, swelling, or pain around your incision.  You have more fluid or blood coming from your incision.  Your incision feels warm to the touch.  You have pus or a bad smell coming from your incision.  You have pain that is not relieved by your pain medicine. Get help right away if you have:  A fever or chills.  Chest pain.  Difficulty breathing. Summary  After the procedure, it is common to have pain, soreness, swelling, or bruising near your incision.  If you were prescribed an antibiotic medicine, take it as told by your health care provider. Do not stop taking the antibiotic even if you start to feel better.  Do not drive for 24 hours   if you were given a sedative during your procedure.  Return to your normal activities as told by your health care provider. Ask your health care provider what activities are safe for you. This information is not intended to replace advice given to you by your health care provider. Make sure you discuss any questions you have with your health care provider. Document Revised: 05/21/2017 Document Reviewed: 05/21/2017 Elsevier Patient Education  2020 [employer] Inc.  

## 2019-05-20 NOTE — Progress Notes (Signed)
   CC: Foot wound  HPI:  Mr.Keylor Bielak is a 49 y.o. male with a past medical history stated below and presents today for foot wound. Please see problem based assessment and plan for additional details.   Past Medical History:  Diagnosis Date  . Anal cancer ([place])   . Anal warts   . Carpal tunnel syndrome    Right  . COPD (chronic obstructive pulmonary disease) ([place])   . Depression   . Hemorrhoids 02/25/2015  . History of tuberculin skin testing within last year 2014  . HIV (human immunodeficiency virus infection) ([place])   . Hypertension   . Latent syphilis   . Lung nodule    Right upper lobe  . Pneumonia 2007   x2  . Pneumothorax 07/01/2018   LEFT  . Rectal cancer ([place])      Review of Systems: ROS   Vitals:   05/20/19 0907  BP: (!) 120/91  Pulse: 65  Temp: 98.3 F (36.8 C)  TempSrc: Oral  SpO2: 100%  Weight: 153 lb 3.2 oz (69.5 kg)  Height: 5\' 11"  (1.803 m)     Physical Exam: Physical Exam  Constitutional: He is oriented to person, place, and time and well-developed, well-nourished, and in no distress.  HENT:  Head: Normocephalic and atraumatic.  Eyes: EOM are normal.  Cardiovascular: Normal rate, regular rhythm, normal heart sounds and intact distal pulses.  Pulmonary/Chest: Effort normal and breath sounds normal.  Abdominal: Soft. He exhibits no distension. There is no abdominal tenderness.  Musculoskeletal:        General: No tenderness or edema. Normal range of motion.     Cervical back: Normal range of motion.  Neurological: He is alert and oriented to person, place, and time.  Skin:        Assessment & Plan:   See Encounters Tab for problem based charting.  Patient discussed with Dr. Dareen Piano

## 2019-05-20 NOTE — Progress Notes (Signed)
Per Loree Fee, they will start IV in radiology

## 2019-05-20 NOTE — Patient Instructions (Addendum)
Thank you, [name] for allowing [place] to provide your care today. Today we discussed foot wound.    I have ordered none labs for you. I will call if any are abnormal.    I have place a referrals to wound care for foot.   I have ordered the following tests: X-rays   I have ordered the following medication/changed the following medications:  1. Doxycycline 100 mg twice daily for 7 days   Please follow-up in 1 weeks.    Should you have any questions or concerns please call the internal medicine clinic at [phone].    Jeremiah [name], D.O. [facility]

## 2019-05-24 ENCOUNTER — Ambulatory Visit: Payer: Medicaid Other

## 2019-05-24 ENCOUNTER — Telehealth: Payer: Self-pay | Admitting: *Deleted

## 2019-05-24 NOTE — Telephone Encounter (Signed)
[employer] will not cover Doxycycline Monohydrate 100 mg tablets w/o a PA. Call to Patient stated he can swallow capsules if needed.  Change to Doxycycline 100 mg capsules was called to [employer].  Message to be sent to Dr. Marianna Payment to change to capsules sothat patient can afford.  Sander Nephew, RN 05/24/2019 4:48 PM.

## 2019-05-25 MED ORDER — DOXYCYCLINE MONOHYDRATE 100 MG PO CAPS: 100.0000 mg | ORAL_CAPSULE | Freq: Two times a day (BID) | ORAL | 0 refills | Status: AC

## 2019-05-25 NOTE — Telephone Encounter (Signed)
Called patient to inform him that I put in the new prescription for doxy capsule instead of tablets. I asked him why he hasn't done his XR of his foot yet. Hey states that he did not remember to get the XR. He said he would get them first thing tomorrow morning. I told him it is very important that he gets the images as he may has an abscess vs chronic osteomyelitis.   Marianna Payment, D.O. Date 05/25/2019 Time 3:30 PM [facility], PGY-1

## 2019-05-26 ENCOUNTER — Ambulatory Visit (INDEPENDENT_AMBULATORY_CARE_PROVIDER_SITE_OTHER): Payer: Medicaid Other | Admitting: Radiation Oncology

## 2019-05-26 ENCOUNTER — Ambulatory Visit (HOSPITAL_COMMUNITY)
Admission: RE | Admit: 2019-05-26 | Discharge: 2019-05-26 | Disposition: A | Discharge status: Home / Self Care | Payer: Medicaid Other | Source: Ambulatory Visit | Attending: Internal Medicine | Admitting: Internal Medicine

## 2019-05-26 ENCOUNTER — Other Ambulatory Visit: Payer: Self-pay

## 2019-05-26 ENCOUNTER — Encounter: Payer: Self-pay | Admitting: Radiation Oncology

## 2019-05-26 VITALS — BP 128/80 | HR 82 | Temp 98.7°F | Ht 71.0 in | Wt 152.6 lb

## 2019-05-26 DIAGNOSIS — S91105A Unspecified open wound of left lesser toe(s) without damage to nail, initial encounter: Secondary | ICD-10-CM | POA: Diagnosis not present

## 2019-05-26 DIAGNOSIS — M79672 Pain in left foot: Secondary | ICD-10-CM | POA: Insufficient documentation

## 2019-05-26 DIAGNOSIS — K029 Dental caries, unspecified: Secondary | ICD-10-CM | POA: Diagnosis not present

## 2019-05-26 DIAGNOSIS — S91302A Unspecified open wound, left foot, initial encounter: Secondary | ICD-10-CM | POA: Diagnosis not present

## 2019-05-26 MED ORDER — HYDROCODONE-ACETAMINOPHEN 5-300 MG PO TABS: 5.0000 mg | ORAL_TABLET | Freq: Four times a day (QID) | ORAL | 0 refills | Status: AC | PRN

## 2019-05-26 MED ORDER — MAGIC MOUTHWASH W/LIDOCAINE: 5.0000 mL | Freq: Four times a day (QID) | ORAL | 0 refills | Status: AC

## 2019-05-26 NOTE — Progress Notes (Signed)
   CC: toothache   HPI:  Jeremiah [name] is a 49 y.o. male who presents to the Internal Medicine Clinic for a toothache. Please see Assessment and Plan for full HPI.  Past Medical History:  Diagnosis Date  . Anal cancer ([place])   . Anal warts   . Carpal tunnel syndrome    Right  . COPD (chronic obstructive pulmonary disease) ([place])   . Depression   . Hemorrhoids 02/25/2015  . History of tuberculin skin testing within last year 2014  . HIV (human immunodeficiency virus infection) ([place])   . Hypertension   . Latent syphilis   . Lung nodule    Right upper lobe  . Pneumonia 2007   x2  . Pneumothorax 07/01/2018   LEFT  . Rectal cancer ([place])    Review of Systems:  Please see Assessment and Plan for full ROS.  Physical Exam:  There were no vitals filed for this visit.  Physical Exam  Constitutional: He is well-developed, well-nourished, and in no distress. No distress.  HENT:  Poor dentition, no swelling or drainage  Pulmonary/Chest: Effort normal. No respiratory distress.  Musculoskeletal:        General: Normal range of motion.  Neurological: He is alert.  Skin: Skin is warm and dry. He is not diaphoretic.  Psychiatric: Affect normal.  Nursing note and vitals reviewed.  Assessment & Plan:   See Encounters Tab for problem based charting.  Patient discussed with Dr. Philipp Ovens

## 2019-05-26 NOTE — Assessment & Plan Note (Addendum)
Patient with hx of dental caries here with toothache and swelling over the last day. He states he has had multiple prior tooth infections treated with antibiotics. On exam there is no swelling, erythema, abscess or drainage. He has an appointment with his dentist next week.  Plan: -    Hydrocodone for severe pain -    Magic mouthwash w/lidocaine SOLN; Take 5 mLs by mouth 4 (four) times daily for 7 days. -    Follow up with your dentist  -    Call us if you develop fevers or chills

## 2019-05-26 NOTE — Progress Notes (Signed)
Internal Medicine Clinic Attending  Case discussed with Dr. Lanierat the time of the visit.  We reviewed the resident's history and exam and pertinent patient test results.  I agree with the assessment, diagnosis, and plan of care documented in the resident's note.   

## 2019-05-26 NOTE — Patient Instructions (Addendum)
Thank you for coming to your appointment. It was so nice to see you. Today we discussed  Trence was seen today for dental pain.  Diagnoses and all orders for this visit:  Dental caries -    Hydrocodone for severe pain -    Magic mouthwash w/lidocaine SOLN; Take 5 mLs by mouth 4 (four) times daily for 7 days.       -    Follow up with your dentist        -    Call us if you develop fevers or chills    If labs were drawn today, I will call you with any abnormal results. Otherwise, please keep up the good work. I look forward to seeing you again soon at your follow up in 3 months.  Sincerely,  Al Decant, MD

## 2019-05-28 NOTE — Assessment & Plan Note (Signed)
Assessment: Patient presents with pain and swelling of his left 5th digit. He states that it has been there for a month or 2. He admits to foul smelling clear drainage. He denies fevers, chills, shortness of breath. He is taking medications for his HIV and he states that his viral load is undetectable. Furthermore, he also admits to stepping on a nail 2 days ago. Does not know when his last tetanus shot was. He does not have one on file.   Plan: -  XR of left foot to look for osteomyelitis - Prescribed doxycyline 100 mg bid for 7 days - Tdap shot given today.

## 2019-05-30 NOTE — Progress Notes (Signed)
Internal Medicine Clinic Attending  Case discussed with Dr. Coe at the time of the visit.  We reviewed the resident's history and exam and pertinent patient test results.  I agree with the assessment, diagnosis, and plan of care documented in the resident's note.    

## 2019-06-01 ENCOUNTER — Other Ambulatory Visit: Payer: Self-pay

## 2019-06-01 ENCOUNTER — Encounter (HOSPITAL_BASED_OUTPATIENT_CLINIC_OR_DEPARTMENT_OTHER): Payer: Medicaid Other | Attending: Physician Assistant | Admitting: Physician Assistant

## 2019-06-01 DIAGNOSIS — Z21 Asymptomatic human immunodeficiency virus [HIV] infection status: Secondary | ICD-10-CM | POA: Insufficient documentation

## 2019-06-01 DIAGNOSIS — L97522 Non-pressure chronic ulcer of other part of left foot with fat layer exposed: Secondary | ICD-10-CM | POA: Insufficient documentation

## 2019-06-01 DIAGNOSIS — G629 Polyneuropathy, unspecified: Secondary | ICD-10-CM | POA: Diagnosis not present

## 2019-06-01 DIAGNOSIS — Z8249 Family history of ischemic heart disease and other diseases of the circulatory system: Secondary | ICD-10-CM | POA: Insufficient documentation

## 2019-06-01 DIAGNOSIS — I10 Essential (primary) hypertension: Secondary | ICD-10-CM | POA: Diagnosis not present

## 2019-06-01 DIAGNOSIS — G9009 Other idiopathic peripheral autonomic neuropathy: Secondary | ICD-10-CM | POA: Diagnosis not present

## 2019-06-01 DIAGNOSIS — Z85048 Personal history of other malignant neoplasm of rectum, rectosigmoid junction, and anus: Secondary | ICD-10-CM | POA: Diagnosis not present

## 2019-06-01 DIAGNOSIS — J449 Chronic obstructive pulmonary disease, unspecified: Secondary | ICD-10-CM | POA: Insufficient documentation

## 2019-06-01 DIAGNOSIS — Z882 Allergy status to sulfonamides status: Secondary | ICD-10-CM | POA: Insufficient documentation

## 2019-06-01 DIAGNOSIS — Z833 Family history of diabetes mellitus: Secondary | ICD-10-CM | POA: Diagnosis not present

## 2019-06-01 DIAGNOSIS — I252 Old myocardial infarction: Secondary | ICD-10-CM | POA: Diagnosis not present

## 2019-06-01 DIAGNOSIS — L84 Corns and callosities: Secondary | ICD-10-CM | POA: Diagnosis not present

## 2019-06-06 NOTE — Progress Notes (Signed)
EDGAR, ALLEGRO ([id number]) Visit Report for 06/01/2019 Abuse/Suicide Risk Screen Details Patient Name: Date of Service: Jeremiah [name], Jeremiah [name] 06/01/2019 1:15 PM Medical Record [id number] Patient Account Number: [id number] Date of Birth/Sex: Treating RN: 1970/06/12 (49 y.o. Male) Levan Hurst Primary Care Abigael Mogle: Jeanmarie Hubert Other Clinician: Referring Avigdor Dollar: Treating Keina Mutch/Extender:Stone III, Rometta Emery, MATTHEW Weeks in Treatment: 0 Abuse/Suicide Risk Screen Items Answer ABUSE RISK SCREEN: Has anyone close to you tried to hurt or harm you recentlyo No Do you feel uncomfortable with anyone in your familyo No Has anyone forced you do things that you didnt want to doo No Electronic Signature(s) Signed: 06/06/2019 6:14:31 PM By: Levan Hurst RN, BSN Entered By: Levan Hurst on 06/01/2019 13:40:06 -------------------------------------------------------------------------------- Activities of Daily Living Details Patient Name: Date of Service: Jeremiah [name], Jeremiah [name] 06/01/2019 1:15 PM Medical Record [id number] Patient Account Number: [id number] Date of Birth/Sex: Treating RN: 21-May-1970 (49 y.o. Male) Levan Hurst Primary Care Davison Ohms: Jeanmarie Hubert Other Clinician: Referring Rupinder Livingston: Treating Ashten Prats/Extender:Stone III, Rometta Emery, MATTHEW Weeks in Treatment: 0 Activities of Daily Living Items Answer Activities of Daily Living (Please select one for each item) Drive Automobile Completely Able Take Medications Completely Able Use Telephone Completely Able Care for Appearance Completely Able Use Toilet Completely [place] Hevia / Shower Completely Able Dress Self Completely Able Feed Self Completely Able Walk Completely Able Get In / Out Bed Completely [place] for Self Completely Able Electronic Signature(s) Signed: 06/06/2019 6:14:31 PM By: Levan Hurst RN,  BSN Entered By: Levan Hurst on 06/01/2019 13:40:24 -------------------------------------------------------------------------------- Education Screening Details Patient Name: Date of Service: Jeremiah [name], Jeremiah [name] 06/01/2019 1:15 PM Medical Record [id number] Patient Account Number: [id number] Date of Birth/Sex: Treating RN: March 11, 1971 (48 y.o. Male) Levan Hurst Primary Care Rhian Funari: Jeanmarie Hubert Other Clinician: Referring Marica Trentham: Treating Santana Edell/Extender:Stone III, Rometta Emery, MATTHEW Weeks in Treatment: 0 Primary Learner Assessed: Patient Learning Preferences/Education Level/Primary Language Learning Preference: Explanation, Demonstration, Printed Material Highest Education Level: High School Preferred Language: English Cognitive Barrier Language Barrier: No Translator Needed: No Memory Deficit: No Emotional Barrier: No Cultural/Religious Beliefs Affecting Medical Care: No Physical Barrier Impaired Vision: No Impaired Hearing: No Decreased Hand dexterity: No Knowledge/Comprehension Knowledge Level: High Comprehension Level: High Ability to understand written High instructions: Ability to understand verbal High instructions: Motivation Anxiety Level: Calm Cooperation: Cooperative Education Importance: Acknowledges Need Interest in Health Problems: Asks Questions Perception: Coherent Willingness to Engage in Self- High Management Activities: Readiness to Engage in Self- High Management Activities: Electronic Signature(s) Signed: 06/06/2019 6:14:31 PM By: Levan Hurst RN, BSN Entered By: Levan Hurst on 06/01/2019 13:40:42 -------------------------------------------------------------------------------- Fall Risk Assessment Details Patient Name: Date of Service: Jeremiah [name], Jeremiah [name] 06/01/2019 1:15 PM Medical Record [id number] Patient Account Number: [id number] Date of Birth/Sex: Treating RN: [birth year] (49 y.o. Male) Levan Hurst Primary Care Ariona Deschene: Jeanmarie Hubert Other Clinician: Referring Yulitza Shorts: Treating Zaray Gatchel/Extender:Stone III, Rometta Emery, MATTHEW Weeks in Treatment: 0 Fall Risk Assessment Items Have you had 2 or more falls in the last 12 monthso 0 No Have you had any fall that resulted in injury in the last 12 monthso 0 Yes FALLS RISK SCREEN History of falling - immediate or within 3 months 25 Yes Secondary diagnosis (Do you have 2 or more medical diagnoseso) 0 No Ambulatory aid None/bed rest/wheelchair/nurse 0 No Crutches/cane/walker 0 No Furniture 0 No Intravenous therapy Access/Saline/Heparin Lock 0 No Weak (short steps with or without shuffle, stooped but able to lift head 0 No while walking, may seek support from furniture) Impaired (short steps  with shuffle, may have difficulty arising from chair, 0 No head down, impaired balance) Mental Status Oriented to own ability 0 Yes Overestimates or forgets limitations 0 No Risk Level: Medium Risk Score: 25 Electronic Signature(s) Signed: 06/06/2019 6:14:31 PM By: Levan Hurst RN, BSN Entered By: Levan Hurst on 06/01/2019 13:40:58 -------------------------------------------------------------------------------- Foot Assessment Details Patient Name: Date of Service: Jeremiah [name], Jeremiah [name] 06/01/2019 1:15 PM Medical Record [id number] Patient Account Number: [id number] Date of Birth/Sex: Treating RN: 1971/01/14 (48 y.o. Male) Levan Hurst Primary Care Asako Saliba: Jeanmarie Hubert Other Clinician: Referring Rj Pedrosa: Treating Jacey Pelc/Extender:Stone III, Rometta Emery, MATTHEW Weeks in Treatment: 0 Foot Assessment Items Site Locations + = Sensation present, - = Sensation absent, C = Callus, U = Ulcer R = Redness, W = Warmth, M = Maceration, PU = Pre-ulcerative lesion F = Fissure, S = Swelling, D = Dryness Assessment Right: Left: Other Deformity: No No Prior Foot Ulcer: No No Prior Amputation: No No Charcot Joint: No  No Ambulatory Status: Ambulatory Without Help Gait: Steady Electronic Signature(s) Signed: 06/06/2019 6:14:31 PM By: Levan Hurst RN, BSN Entered By: Levan Hurst on 06/01/2019 13:52:18 -------------------------------------------------------------------------------- Nutrition Risk Screening Details Patient Name: Date of Service: Jeremiah [name], Jeremiah [name] 06/01/2019 1:15 PM Medical Record [id number] Patient Account Number: [id number] Date of Birth/Sex: Treating RN: 30-Sep-1970 (49 y.o. Male) Levan Hurst Primary Care Maor Meckel: Jeanmarie Hubert Other Clinician: Referring Aidric Endicott: Treating Kylieann Eagles/Extender:Stone III, Rometta Emery, MATTHEW Weeks in Treatment: 0 Height (in): 71 Weight (lbs): 155 Body Mass Index (BMI): 21.6 Nutrition Risk Screening Items Score Screening NUTRITION RISK SCREEN: I have an illness or condition that made me change the kind and/or 2 Yes amount of food I eat I eat fewer than two meals per day 0 No I eat few fruits and vegetables, or milk products 0 No I have three or more drinks of beer, liquor or wine almost every day 0 No I have tooth or mouth problems that make it hard for me to eat 0 No I don't always have enough money to buy the food I need 0 No I eat alone most of the time 0 No I take three or more different prescribed or over-the-counter drugs a day 1 Yes 0 No Without wanting to, I have lost or gained 10 pounds in the last six months I am not always physically able to shop, cook and/or feed myself 0 No Nutrition Protocols Good Risk Protocol Provide education on Moderate Risk Protocol 0 nutrition High Risk Proctocol Risk Level: Moderate Risk Score: 3 Electronic Signature(s) Signed: 06/06/2019 6:14:31 PM By: Levan Hurst RN, BSN Entered By: Levan Hurst on 06/01/2019 13:41:21

## 2019-06-06 NOTE — Progress Notes (Signed)
FALLON, SALAIZ ([id number]) Visit Report for 06/01/2019 Allergy List Details Patient Name: Date of Service: NEILSON, MELDE 06/01/2019 1:15 PM Medical Record [id number] Patient Account Number: [id number] Date of Birth/Sex: Treating RN: Sep 17, 1970 (49 y.o. Male) Levan Hurst Primary Care Gwenda Heiner: Jeanmarie Hubert Other Clinician: Referring Gadge Hermiz: Treating Irlene Crudup/Extender:Stone III, Rometta Emery, MATTHEW Weeks in Treatment: 0 Allergies Active Allergies Sulfa (Sulfonamide Antibiotics) Reaction: itching Severity: Moderate Allergy Notes Electronic Signature(s) Signed: 06/06/2019 6:14:31 PM By: Levan Hurst RN, BSN Entered By: Levan Hurst on 06/01/2019 13:28:14 -------------------------------------------------------------------------------- Arrival Information Details Patient Name: Date of Service: LAMEL, HAYENGA 06/01/2019 1:15 PM Medical Record [id number] Patient Account Number: [id number] Date of Birth/Sex: Treating RN: 1970-10-24 (49 y.o. Male) Levan Hurst Primary Care Mertha Clyatt: Jeanmarie Hubert Other Clinician: Referring Miski Feldpausch: Treating Arlington Sigmund/Extender:Stone III, Rometta Emery, MATTHEW Weeks in Treatment: 0 Visit Information Patient Arrived: Ambulatory Arrival Time: 13:20 Accompanied By: alone Transfer Assistance: None Patient Identification Verified: Yes Secondary Verification Process Yes Completed: Patient Has Alerts: Yes Patient Alerts: HIV positive Electronic Signature(s) Signed: 06/06/2019 6:14:31 PM By: Levan Hurst RN, BSN Entered By: Levan Hurst on 06/01/2019 13:20:20 -------------------------------------------------------------------------------- Encounter Discharge Information Details Patient Name: Date of Service: MARTAVION, BEECK 06/01/2019 1:15 PM Medical Record [id number] Patient Account Number: [id number] Date of Birth/Sex: Treating RN: 28-Apr-1970 (49 y.o. Male) Carlene Coria Primary Care Jaaziel Peatross:  Jeanmarie Hubert Other Clinician: Referring Sakari Raisanen: Treating Luca Dyar/Extender:Stone III, Rometta Emery, MATTHEW Weeks in Treatment: 0 Encounter Discharge Information Items Discharge Condition: Stable Ambulatory Status: Ambulatory Discharge Destination: Home Transportation: Private Auto Accompanied By: self Schedule Follow-up Appointment: Yes Clinical Summary of Care: Patient Declined Electronic Signature(s) Signed: 06/01/2019 4:53:37 PM By: Carlene Coria RN Entered By: Carlene Coria on 06/01/2019 14:53:28 -------------------------------------------------------------------------------- Lower Extremity Assessment Details Patient Name: Date of Service: DARTAGNAN, HUGHES 06/01/2019 1:15 PM Medical Record [id number] Patient Account Number: [id number] Date of Birth/Sex: Treating RN: Jun 24, 1970 (49 y.o. Male) Levan Hurst Primary Care Anaid Haney: Jeanmarie Hubert Other Clinician: Referring Chance Munter: Treating Athanasius Kesling/Extender:Stone III, Rometta Emery, MATTHEW Weeks in Treatment: 0 Edema Assessment Assessed: [Left: Yes] [Right: No] Edema: [Left: N] [Right: o] Calf Left: Right: Point of Measurement: 36 cm From Medial Instep 34.2 cm cm Ankle Left: Right: Point of Measurement: 12 cm From Medial Instep 21.5 cm cm Vascular Assessment Pulses: Dorsalis Pedis Palpable: [Left:Yes] Doppler Audible: [Left:Yes] Posterior Tibial Palpable: [Left:Yes] Doppler Audible: [Left:Yes] Blood Pressure: Brachial: [Left:101] Ankle: [Left:Dorsalis Pedis: 108 1.07] Electronic Signature(s) Signed: 06/06/2019 6:14:31 PM By: Levan Hurst RN, BSN Entered By: Levan Hurst on 06/01/2019 13:47:56 -------------------------------------------------------------------------------- [place] Details Patient Name: Date of Service: DHILAN, SZALKOWSKI 06/01/2019 1:15 PM Medical Record [id number] Patient Account Number: [id number] Date of Birth/Sex: Treating RN: [birth year] (49 y.o.  Male) Baruch Gouty Primary Care Lova Urbieta: Jeanmarie Hubert Other Clinician: Referring Adamary Savary: Treating Jewell Ryans/Extender:Stone III, Rometta Emery, MATTHEW Weeks in Treatment: 0 Active Inactive Abuse / Safety / Falls / Self Care Management Nursing Diagnoses: Potential for falls Goals: Patient/caregiver will verbalize/demonstrate measures taken to prevent injury and/or falls Date Initiated: 06/01/2019 Target Resolution Date: 06/29/2019 Goal Status: Active Interventions: Assess fall risk on admission and as needed Assess: immobility, friction, shearing, incontinence upon admission and as needed Notes: Wound/Skin Impairment Nursing Diagnoses: Impaired tissue integrity Knowledge deficit related to smoking impact on wound healing Knowledge deficit related to ulceration/compromised skin integrity Goals: Patient will demonstrate a reduced rate of smoking or cessation of smoking Date Initiated: 06/01/2019 Target Resolution Date: 06/29/2019 Goal Status: Active Patient/caregiver will verbalize understanding of skin care regimen Date Initiated: 06/01/2019 Target Resolution Date: 06/29/2019 Goal Status: Active Ulcer/skin breakdown will have  a volume reduction of 30% by week 4 Date Initiated: 06/01/2019 Target Resolution Date: 06/29/2019 Goal Status: Active Interventions: Assess patient/caregiver ability to obtain necessary supplies Assess patient/caregiver ability to perform ulcer/skin care regimen upon admission and as needed Assess ulceration(s) every visit Provide education on smoking Provide education on ulcer and skin care Treatment Activities: Skin care regimen initiated : 06/01/2019 Topical wound management initiated : 06/01/2019 Notes: Electronic Signature(s) Signed: 06/01/2019 5:39:41 PM By: Baruch Gouty RN, BSN Entered By: Baruch Gouty on 06/01/2019 14:35:06 -------------------------------------------------------------------------------- Pain Assessment Details Patient  Name: Date of Service: ABDI, ROLLE 06/01/2019 1:15 PM Medical Record [id number] Patient Account Number: [id number] Date of Birth/Sex: Treating RN: February 20, 1971 (49 y.o. Male) Levan Hurst Primary Care Trig Mcbryar: Jeanmarie Hubert Other Clinician: Referring Jasemine Nawaz: Treating Kazuki Ingle/Extender:Stone III, Rometta Emery, MATTHEW Weeks in Treatment: 0 Active Problems Location of Pain Severity and Description of Pain Patient Has Paino Yes Site Locations Pain Location: Pain in Ulcers With Dressing Change: Yes Duration of the Pain. Constant / Intermittento Intermittent Rate the pain. Current Pain Level: 2 Worst Pain Level: 10 Least Pain Level: 0 Character of Pain Describe the Pain: Burning Pain Management and Medication Current Pain Management: Medication: Yes Cold Application: No Rest: No Massage: No Activity: No T.E.N.S.: No Heat Application: No Leg drop or elevation: No Is the Current Pain Management Adequate: Adequate How does your wound impact your activities of daily livingo Sleep: No Bathing: No Appetite: No Relationship With Others: No Bladder Continence: No Emotions: No Bowel Continence: No Work: No Toileting: No Drive: No Dressing: No Hobbies: No Electronic Signature(s) Signed: 06/06/2019 6:14:31 PM By: Levan Hurst RN, BSN Entered By: Levan Hurst on 06/01/2019 13:51:51 -------------------------------------------------------------------------------- Patient/Caregiver Education Details Patient Name: [name] 2/10/2021andnbsp1:15 Date of Service: PM Medical Record [id number] Number: Patient Account Number: [id number] Treating RN: Date of Birth/Gender: 1971-01-14 (49 y.o. Baruch Gouty Male) Other Clinician: Primary Care Treating MACLEAN, [place] Physician: Physician/Extender: Referring Physician: Star Age in Treatment: 0 Education Assessment Education Provided To: Patient Education Topics  Provided Smoking and Wound Healing: Methods: Explain/Verbal Responses: Reinforcements needed, State content correctly Welcome [facility]: Handouts: Welcome [facility] Methods: Explain/Verbal Responses: Reinforcements needed, State content correctly Wound/Skin Impairment: Handouts: Caring for Your Ulcer, Skin Care Do's and Dont's, Smoking and Wound Healing Methods: Explain/Verbal Responses: Reinforcements needed, See progress note Electronic Signature(s) Signed: 06/01/2019 5:39:41 PM By: Baruch Gouty RN, BSN Entered By: Baruch Gouty on 06/01/2019 14:35:46 -------------------------------------------------------------------------------- Wound Assessment Details Patient Name: Date of Service: ABBEY, FAHNER 06/01/2019 1:15 PM Medical Record [id number] Patient Account Number: [id number] Date of Birth/Sex: Treating RN: 03-28-1971 (48 y.o. Male) Baruch Gouty Primary Care Razia Screws: Jeanmarie Hubert Other Clinician: Referring Marai Teehan: Treating Ishana Blades/Extender:Stone III, Rometta Emery, MATTHEW Weeks in Treatment: 0 Wound Status Wound Number: 1 Primary Neuropathic Ulcer-Non Diabetic Etiology: Wound Location: Left Toe Fifth - Medial Wound Open Wounding Event: Gradually Appeared Status: Date Acquired: 05/16/2019 Comorbid Human Immunodeficiency Virus, Chronic Weeks Of Treatment: 0 History: Obstructive Pulmonary Disease (COPD), Clustered Wound: No Pneumothorax, Neuropathy, Received Chemotherapy, Received Radiation Photos Wound Measurements Length: (cm) 0.3 % Reductio Width: (cm) 0.6 % Reduct Depth: (cm) 0.2 Epitheli Area: (cm) 0.141 Tunneli Volume: (cm) 0.028 Undermi Wound Description Full Thickness Without Exposed Support Classification: Structures Wound Flat and Intact Margin: Exudate Small Amount: Exudate Serous Type: Exudate amber Color: Wound Bed Granulation Amount: Large (67-100%) Granulation Quality: Pink,  Pale Necrotic Amount: None Present (0%) Exposed Structure Fascia Exposed: No Fat Layer (Subcutaneous Tissue) Exposed: Yes Tendon Exposed:  No Muscle Exposed: No Joint Exposed: No Bone Exposed: No n in Area: 0% ion in Volume: 0% alization: None ng: No ning: No Treatment Notes Wound #1 (Left, Medial Toe Fifth) 1. Cleanse With Wound Cleanser 3. Primary Dressing Applied Calcium Alginate Ag 4. Secondary Dressing Dry Gauze 5. Secured With Recruitment consultant) Signed: 06/02/2019 4:30:47 PM By: Mikeal Hawthorne EMT/HBOT Signed: 06/03/2019 5:30:09 PM By: Baruch Gouty RN, BSN Entered By: Mikeal Hawthorne on 06/02/2019 11:38:21 -------------------------------------------------------------------------------- Vitals Details Patient Name: Date of Service: BRAHM, HORMAN 06/01/2019 1:15 PM Medical Record [id number] Patient Account Number: [id number] Date of Birth/Sex: Treating RN: 07-Jan-1971 (49 y.o. Male) Levan Hurst Primary Care Manasa Spease: Jeanmarie Hubert Other Clinician: Referring Alba Kriesel: Treating Jermya Dowding/Extender:Stone III, Rometta Emery, MATTHEW Weeks in Treatment: 0 Vital Signs Time Taken: 13:20 Temperature (F): 98.3 Height (in): 71 Pulse (bpm): 66 Source: Stated Respiratory Rate (breaths/min): 16 Weight (lbs): 155 Blood Pressure (mmHg): 101/72 Source: Stated Reference Range: 80 - 120 mg / dl Body Mass Index (BMI): 21.6 Electronic Signature(s) Signed: 06/06/2019 6:14:31 PM By: Levan Hurst RN, BSN Entered By: Levan Hurst on 06/01/2019 13:26:10

## 2019-06-06 NOTE — Progress Notes (Signed)
VINN, ZANER ([id number]) Visit Report for 06/01/2019 Chief Complaint Document Details Patient Name: Date of Service: Jeremiah [name], Jeremiah [name] 06/01/2019 1:15 PM Medical Record [id number] Patient Account Number: [id number] Date of Birth/Sex: Treating RN: [birth year] (49 y.o. Male) Baruch Gouty Primary Care Provider: Jeanmarie Hubert Other Clinician: Referring Provider: Treating Provider/Extender:Stone III, Rometta Emery, MATTHEW Weeks in Treatment: 0 Information Obtained from: Patient Chief Complaint Left Medial 5th Toe Ulcer Electronic Signature(s) Signed: 06/01/2019 2:28:45 PM By: Worthy Keeler PA-C Entered By: Worthy Keeler on 06/01/2019 14:28:45 -------------------------------------------------------------------------------- HPI Details Patient Name: Date of Service: Jeremiah [name], Jeremiah [name] 06/01/2019 1:15 PM Medical Record [id number] Patient Account Number: [id number] Date of Birth/Sex: Treating RN: Sep 07, 1970 (48 y.o. Male) Baruch Gouty Primary Care Provider: Jeanmarie Hubert Other Clinician: Referring Provider: Treating Provider/Extender:Stone III, Rometta Emery, MATTHEW Weeks in Treatment: 0 History of Present Illness HPI Description: 06/01/2019 patient presents today for initial evaluation here in our clinic concerning issues that he has been having unfortunately for several weeks with a wound between his fourth and fifth toes of the left foot specifically this is against the fifth toe. He did have an x-ray which showed mild cortical irregularity along the medial aspect of the fifth proximal phalanx distally this does correspond with the area in question. With that being said the distal phalanx of the fifth toe was stated to be unlikely infection. Overall I did state that MRI would be helpful for further evaluation if any further medical treatment was necessitated at this point. With that being said the patient seems to barely have anything open although the area  in question is indeed the area that we saw on x-ray as well. No fevers, chills, nausea, vomiting, or diarrhea. The patient does unfortunately note that he is HIV positive. 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 He has also had rectal cancer 3 years ago. He does have neuropathy but unfortunately is still having pain in this toe region. He has not had an MRI at this point. Electronic Signature(s) Signed: 06/01/2019 2:49:11 PM By: Worthy Keeler PA-C Entered By: Worthy Keeler on 06/01/2019 14:49:11 -------------------------------------------------------------------------------- Paring/cutting 1 benign hyperkeratotic lesion Details Patient Name: Date of Service: Jeremiah [name], Jeremiah [name] 06/01/2019 1:15 PM Medical Record [id number] Patient Account Number: [id number] Date of Birth/Sex: 1970/05/16 (48 y.o. Male) Treating RN: Baruch Gouty Primary Care Provider: Jeanmarie Hubert Other Clinician: Referring Provider: Treating Provider/Extender:Stone III, Rometta Emery, MATTHEW Weeks in Treatment: 0 Procedure Performed for: Wound #1 Left,Medial Toe Fifth Performed By: Physician Worthy Keeler, PA Post Procedure Diagnosis Same as Pre-procedure Notes using #3 curette Electronic Signature(s) Signed: 06/01/2019 5:39:41 PM By: Baruch Gouty RN, BSN Signed: 06/01/2019 6:32:46 PM By: Worthy Keeler PA-C Entered By: Baruch Gouty on 06/01/2019 14:42:24 -------------------------------------------------------------------------------- Physical Exam Details Patient Name: Date of Service: Jeremiah [name], Jeremiah [name] 06/01/2019 1:15 PM Medical Record [id number] Patient Account Number: [id number] Date of Birth/Sex: Treating RN: 1971/03/29 (48 y.o. Male) Baruch Gouty Primary Care Provider: Jeanmarie Hubert Other Clinician: Referring Provider: Treating Provider/Extender:Stone III, Rometta Emery, MATTHEW Weeks in Treatment: 0 Constitutional supine blood pressure is within target range for  patient.. pulse regular and within target range for patient.Marland Kitchen respirations regular, non-labored and within target range for patient.Marland Kitchen temperature within target range for patient.. Well-nourished and well-hydrated in no acute distress. Respiratory normal breathing without difficulty. Cardiovascular 2+ dorsalis pedis/posterior tibialis pulses. no clubbing, cyanosis, significant edema, <3 sec cap refill. Gastrointestinal (GI) soft, non-tender, non-distended, +BS. no ventral hernia noted. Musculoskeletal normal gait and posture. no significant deformity or arthritic changes, no loss  or range of motion, no clubbing. Psychiatric this patient is able to make decisions and demonstrates good insight into disease process. Alert and Oriented x 3. pleasant and cooperative. Notes Patient's toe ulcer actually did show signs of significant callus buildup there did not appear to be any obvious evidence of active infection at this time in fact I could never find anything draining I removed as much of the callus as I could without causing pain or discomfort. Once I was able to remove as much as possible there was still a small central indention which I attempted to clean out as well however the patient had significant pain in order to clean this out we would have likely had to actually numb him with injectable lidocaine he really was not wanting to proceed with that he states he is already been to the dentist and has had 2 surgeries in the past 2 weeks. Electronic Signature(s) Signed: 06/01/2019 2:50:10 PM By: Worthy Keeler PA-C Entered By: Worthy Keeler on 06/01/2019 14:50:09 -------------------------------------------------------------------------------- Physician Orders Details Patient Name: Date of Service: Jeremiah [name], Jeremiah [name] 06/01/2019 1:15 PM Medical Record [id number] Patient Account Number: [id number] Date of Birth/Sex: Treating RN: [birth year] (48 y.o. 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 Dressing Change Frequency Wound #1 Left,Medial Toe Fifth Change dressing every day. Wound Cleansing Wound #1 Left,Medial Toe Fifth May shower and wash wound with soap and water. - dry well between toes Primary Wound Dressing Wound #1 Left,Medial Toe Fifth Calcium Alginate with Silver Secondary Dressing Wound #1 Left,Medial Toe Fifth Kerlix/Rolled Gauze Dry Gauze Electronic Signature(s) Signed: 06/01/2019 5:39:41 PM By: Baruch Gouty RN, BSN Signed: 06/01/2019 6:32:46 PM By: Worthy Keeler PA-C Entered By: Baruch Gouty on 06/01/2019 14:44:03 -------------------------------------------------------------------------------- Problem List Details Patient Name: Date of Service: Jeremiah [name], Jeremiah [name] 06/01/2019 1:15 PM Medical Record [id number] Patient Account Number: [id number] Date of Birth/Sex: Treating RN: Sep 15, 1970 (49 y.o. Male) Baruch Gouty Primary Care Provider: Jeanmarie Hubert Other Clinician: Referring Provider: Treating Provider/Extender:Stone III, Rometta Emery, MATTHEW Weeks in Treatment: 0 Active Problems ICD-10 Evaluated Encounter Code Description Active Date Today Diagnosis G90.09 Other idiopathic peripheral autonomic neuropathy 06/01/2019 No Yes L97.522 Non-pressure chronic ulcer of other part of left foot 06/01/2019 No Yes with fat layer exposed B20 Human immunodeficiency virus [HIV] disease 06/01/2019 No Yes A53.0 Latent syphilis, unspecified  as early or late 06/01/2019 No Yes Inactive Problems Resolved Problems Electronic Signature(s) Signed: 06/01/2019 2:28:07 PM By: Worthy Keeler PA-C Entered By: Worthy Keeler on 06/01/2019 14:28:06 -------------------------------------------------------------------------------- Progress Note Details Patient Name: Date of Service: Jeremiah [name], Jeremiah [name] 06/01/2019 1:15 PM Medical Record [id number] Patient Account Number: [id number] Date of Birth/Sex: Treating RN: Oct 22, 1970 (48 y.o. Male) Baruch Gouty Primary Care Provider: Jeanmarie Hubert Other Clinician: Referring Provider: Treating Provider/Extender:Stone III, Rometta Emery, MATTHEW Weeks in Treatment: 0 Subjective Chief Complaint Information obtained from Patient Left Medial 5th Toe Ulcer History of Present Illness (HPI) 06/01/2019 patient presents today for initial evaluation here in our clinic concerning issues that he has been having unfortunately for several weeks with a wound between his fourth and fifth toes of the left foot specifically this is  against the fifth toe. He did have an x-ray which showed mild cortical irregularity along the medial aspect of the fifth proximal phalanx distally this does correspond with the area in question. With that being said the distal phalanx of the fifth toe was stated to be unlikely infection. Overall I did state that MRI would be helpful for further evaluation if any further medical treatment was necessitated at this point. With that being said the patient seems to barely have anything open although the area in question is indeed the area that we saw on x-ray as well. No fevers, chills, nausea, vomiting, or diarrhea. The patient does unfortunately note that he is HIV positive. 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 He has also had rectal cancer 3 years ago. He does have neuropathy but unfortunately is still having pain in this toe region. He has not had an MRI at this  point. Patient History Information obtained from Patient. Allergies Sulfa (Sulfonamide Antibiotics) (Severity: Moderate, Reaction: itching) Family History Diabetes - Mother,Father, Hypertension - Mother,Father, No family history of Cancer, Heart Disease, Hereditary Spherocytosis, Kidney Disease, Lung Disease, Seizures, Stroke, Thyroid Problems, Tuberculosis. Social History Current every day smoker - 3-4 cigars daily, Marital Status - Single, Alcohol Use - Rarely, Drug Use - No History, Caffeine Use - Rarely. Medical History Eyes Denies history of Cataracts, Glaucoma, Optic Neuritis Ear/Nose/Mouth/Throat Denies history of Chronic sinus problems/congestion, Middle ear problems Hematologic/Lymphatic Patient has history of Human Immunodeficiency Virus Denies history of Anemia, Hemophilia, Lymphedema, Sickle Cell Disease Respiratory Patient has history of Chronic Obstructive Pulmonary Disease (COPD), Pneumothorax - march 2020 after sx right partial lobectomy Denies history of Aspiration, Asthma, Sleep Apnea, Tuberculosis Cardiovascular Denies history of Angina, Arrhythmia, Congestive Heart Failure, Coronary Artery Disease, Deep Vein Thrombosis, Hypertension, Hypotension, Myocardial Infarction, Peripheral Arterial Disease, Peripheral Venous Disease, Phlebitis, Vasculitis Gastrointestinal Denies history of Cirrhosis , Colitis, Crohnoos, Hepatitis A, Hepatitis B, Hepatitis C Endocrine Denies history of Type I Diabetes, Type II Diabetes Genitourinary Denies history of End Stage Renal Disease Immunological Denies history of Lupus Erythematosus, Raynaudoos, Scleroderma Integumentary (Skin) Denies history of History of Burn Musculoskeletal Denies history of Gout, Rheumatoid Arthritis, Osteoarthritis, Osteomyelitis Neurologic Patient has history of Neuropathy Denies history of Dementia, Quadriplegia, Paraplegia, Seizure Disorder Oncologic Patient has history of Received Chemotherapy  - 2018, Received Radiation - 2018 Psychiatric Denies history of Anorexia/bulimia, Confinement Anxiety Hospitalization/Surgery History - anal Ca 2018 mass removed. - march 2020 right partial lobectomy r/t Ca mass. - Port of Cath removed x2 weeks. Medical And Surgical History Notes Oncologic Anal Ca Review of Systems (ROS) Constitutional Symptoms (General Health) Denies complaints or symptoms of Fatigue, Fever, Chills, Marked Weight Change. Eyes Denies complaints or symptoms of Dry Eyes, Vision Changes, Glasses / Contacts. Ear/Nose/Mouth/Throat Denies complaints or symptoms of Chronic sinus problems or rhinitis. Respiratory Denies complaints or symptoms of Chronic or frequent coughs, Shortness of Breath. Cardiovascular Denies complaints or symptoms of Chest pain. Gastrointestinal Denies complaints or symptoms of Frequent diarrhea, Nausea, Vomiting. Endocrine Denies complaints or symptoms of Heat/cold intolerance. Genitourinary Denies complaints or symptoms of Frequent urination. Integumentary (Skin) Complains or has symptoms of Wounds - left foot. Musculoskeletal Denies complaints or symptoms of Muscle Pain, Muscle Weakness. Neurologic Denies complaints or symptoms of Numbness/parasthesias. Psychiatric Denies complaints or symptoms of Claustrophobia, Suicidal. Objective Constitutional supine blood pressure is within target range for patient.. pulse regular and within target range for patient.Marland Kitchen respirations regular, non-labored and within target range for patient.Marland Kitchen temperature within target range for patient.Marland Kitchen  Well-nourished and well-hydrated in no acute distress. Vitals Time Taken: 1:20 PM, Height: 71 in, Source: Stated, Weight: 155 lbs, Source: Stated, BMI: 21.6, Temperature: 98.3 F, Pulse: 66 bpm, Respiratory Rate: 16 breaths/min, Blood Pressure: 101/72 mmHg. Respiratory normal breathing without difficulty. Cardiovascular 2+ dorsalis pedis/posterior tibialis pulses. no  clubbing, cyanosis, significant edema, Gastrointestinal (GI) soft, non-tender, non-distended, +BS. no ventral hernia noted. Musculoskeletal normal gait and posture. no significant deformity or arthritic changes, no loss or range of motion, no clubbing. Psychiatric this patient is able to make decisions and demonstrates good insight into disease process. Alert and Oriented x 3. pleasant and cooperative. General Notes: Patient's toe ulcer actually did show signs of significant callus buildup there did not appear to be any obvious evidence of active infection at this time in fact I could never find anything draining I removed as much of the callus as I could without causing pain or discomfort. Once I was able to remove as much as possible there was still a small central indention which I attempted to clean out as well however the patient had significant pain in order to clean this out we would have likely had to actually numb him with injectable lidocaine he really was not wanting to proceed with that he states he is already been to the dentist and has had 2 surgeries in the past 2 weeks. Integumentary (Hair, Skin) Wound #1 status is Open. Original cause of wound was Gradually Appeared. The wound is located on the Left,Medial Toe Fifth. The wound measures 0.3cm length x 0.6cm width x 0.2cm depth; 0.141cm^2 area and 0.028cm^3 volume. There is Fat Layer (Subcutaneous Tissue) Exposed exposed. There is no tunneling or undermining noted. There is a small amount of serous drainage noted. The wound margin is flat and intact. There is large (67-100%) pink, pale granulation within the wound bed. There is no necrotic tissue within the wound bed. Assessment Active Problems ICD-10 Other idiopathic peripheral autonomic neuropathy Non-pressure chronic ulcer of other part of left foot with fat layer exposed Human immunodeficiency virus [HIV] disease Latent syphilis, unspecified as early or  late Procedures Wound #1 Pre-procedure diagnosis of Wound #1 is a Neuropathic Ulcer-Non Diabetic located on the Left,Medial Toe Fifth . An Paring/cutting 1 benign hyperkeratotic lesion procedure was performed by Worthy Keeler, PA. Post procedure Diagnosis Wound #1: Same as Pre-Procedure Notes: using #3 curette Plan Follow-up Appointments: Return Appointment in 1 week. Dressing Change Frequency: Wound #1 Left,Medial Toe Fifth: Change dressing every day. Wound Cleansing: Wound #1 Left,Medial Toe Fifth: May shower and wash wound with soap and water. - dry well between toes Primary Wound Dressing: Wound #1 Left,Medial Toe Fifth: Calcium Alginate with Silver Secondary Dressing: Wound #1 Left,Medial Toe Fifth: Kerlix/Rolled Gauze Dry Gauze 1. At this point I would recommend that we can watch and wait using just a silver alginate dressing between the toes see how things appear next week. 2. If things do not seem to be improving or he is continues to have significant pain that I likely would recommend a MRI of the foot in order to evaluate for osteomyelitis or anything more significant going on the right now again I did not see evidence of this. 3. If anything changes the patient knows to contact the office and let me know but I am hopeful that he may start to feel signs of improvement have no further drainage and actually do fairly well between now next time I see him. We will see patient back for reevaluation  in 1 week here in the clinic. If anything worsens or changes patient will contact our office for additional recommendations. Electronic Signature(s) Signed: 06/01/2019 2:50:53 PM By: Worthy Keeler PA-C Entered By: Worthy Keeler on 06/01/2019 14:50:53 -------------------------------------------------------------------------------- HxROS Details Patient Name: Date of Service: Jeremiah [name], Jeremiah [name] 06/01/2019 1:15 PM Medical Record [id number] Patient Account Number:  [id number] Date of Birth/Sex: Treating RN: Jul 30, 1970 (49 y.o. Male) Levan Hurst Primary Care Provider: Jeanmarie Hubert Other Clinician: Referring Provider: Treating Provider/Extender:Stone III, Rometta Emery, MATTHEW Weeks in Treatment: 0 Information Obtained From Patient Constitutional Symptoms (General Health) Complaints and Symptoms: Negative for: Fatigue; Fever; Chills; Marked Weight Change Eyes Complaints and Symptoms: Negative for: Dry Eyes; Vision Changes; Glasses / Contacts Medical History: Negative for: Cataracts; Glaucoma; Optic Neuritis Ear/Nose/Mouth/Throat Complaints and Symptoms: Negative for: Chronic sinus problems or rhinitis Medical History: Negative for: Chronic sinus problems/congestion; Middle ear problems Respiratory Complaints and Symptoms: Negative for: Chronic or frequent coughs; Shortness of Breath Medical History: Positive for: Chronic Obstructive Pulmonary Disease (COPD); Pneumothorax - march 2020 after sx right partial lobectomy Negative for: Aspiration; Asthma; Sleep Apnea; Tuberculosis Cardiovascular Complaints and Symptoms: Negative for: Chest pain Medical History: Negative for: Angina; Arrhythmia; Congestive Heart Failure; Coronary Artery Disease; Deep Vein Thrombosis; Hypertension; Hypotension; Myocardial Infarction; Peripheral Arterial Disease; Peripheral Venous Disease; Phlebitis; Vasculitis Gastrointestinal Complaints and Symptoms: Negative for: Frequent diarrhea; Nausea; Vomiting Medical History: Negative for: Cirrhosis ; Colitis; Crohns; Hepatitis A; Hepatitis B; Hepatitis C Endocrine Complaints and Symptoms: Negative for: Heat/cold intolerance Medical History: Negative for: Type I Diabetes; Type II Diabetes Genitourinary Complaints and Symptoms: Negative for: Frequent urination Medical History: Negative for: End Stage Renal Disease Integumentary (Skin) Complaints and Symptoms: Positive for: Wounds - left foot Medical  History: Negative for: History of Burn Musculoskeletal Complaints and Symptoms: Negative for: Muscle Pain; Muscle Weakness Medical History: Negative for: Gout; Rheumatoid Arthritis; Osteoarthritis; Osteomyelitis Neurologic Complaints and Symptoms: Negative for: Numbness/parasthesias Medical History: Positive for: Neuropathy Negative for: Dementia; Quadriplegia; Paraplegia; Seizure Disorder Psychiatric Complaints and Symptoms: Negative for: Claustrophobia; Suicidal Medical History: Negative for: Anorexia/bulimia; Confinement Anxiety Hematologic/Lymphatic Medical History: Positive for: Human Immunodeficiency Virus Negative for: Anemia; Hemophilia; Lymphedema; Sickle Cell Disease Immunological Medical History: Negative for: Lupus Erythematosus; Raynauds; Scleroderma Oncologic Medical History: Positive for: Received Chemotherapy - 2018; Received Radiation - 2018 Past Medical History Notes: Anal Ca Immunizations Pneumococcal Vaccine: Received Pneumococcal Vaccination: No Implantable Devices No devices added Hospitalization / Surgery History Type of Hospitalization/Surgery anal Ca 2018 mass removed march 2020 right partial lobectomy r/t Ca mass Port of Cath removed x2 weeks Family and Social History Cancer: No; Diabetes: Yes - Mother,Father; Heart Disease: No; Hereditary Spherocytosis: No; Hypertension: Yes - Mother,Father; Kidney Disease: No; Lung Disease: No; Seizures: No; Stroke: No; Thyroid Problems: No; Tuberculosis: No; Current every day smoker - 3-4 cigars daily; Marital Status - Single; Alcohol Use: Rarely; Drug Use: No History; Caffeine Use: Rarely; Financial Concerns: No; Food, Clothing or Shelter Needs: No; Support System Lacking: No; Transportation Concerns: No Electronic Signature(s) Signed: 06/01/2019 6:32:46 PM By: Worthy Keeler PA-C Signed: 06/06/2019 6:14:31 PM By: Levan Hurst RN, BSN Entered By: Levan Hurst on 06/01/2019  13:49:37 -------------------------------------------------------------------------------- [place] Details Patient Name: Date of Service: Jeremiah [name], Jeremiah [name] 06/01/2019 Medical Record [id number] Patient Account Number: [id number] Date of Birth/Sex: Treating RN: 09/02/1970 (49 y.o. Male) Baruch Gouty Primary Care Provider: Jeanmarie Hubert Other Clinician: Referring Provider: Treating Provider/Extender:Stone III, Rometta Emery, MATTHEW Weeks in Treatment: 0 Diagnosis Coding ICD-10 Codes Code Description G90.09 Other idiopathic peripheral autonomic neuropathy L97.522 Non-pressure chronic  ulcer of other part of left foot with fat layer exposed B20 Human immunodeficiency virus [HIV] disease A53.0 Latent syphilis, unspecified as early or late Facility Procedures CPT4 Code Description: [id number] - WOUND CARE VISIT-LEV 3 EST PT Modifier: 25 Quantity: 1 CPT4 Code Description: [id number] - PARE BENIGN LES; SGL ICD-10 Diagnosis Description L97.522 Non-pressure chronic ulcer of other part of left foot with fa Modifier: t layer exp Quantity: 1 osed Physician Procedures CPT4 Code Description: [id number] WC PHYS LEVEL 3 NEW PT ICD-10 Diagnosis Description G90.09 Other idiopathic peripheral autonomic neuropathy L97.522 Non-pressure chronic ulcer of other part of left foot with fa B20 Human immunodeficiency virus [HIV]  disease A53.0 Latent syphilis, unspecified as early or late Modifier: 25 t layer exp Quantity: 1 osed CPT4 Code Description: [id number] - WC PHYS PARE BENIGN LES; SGL ICD-10 Diagnosis Description L97.522 Non-pressure chronic ulcer of other part of left foot with fa Modifier: t layer exp Quantity: 1 osed Electronic Signature(s) Signed: 06/01/2019 2:51:56 PM By: Worthy Keeler PA-C Entered By: Worthy Keeler on 06/01/2019 14:51:56

## 2019-06-08 ENCOUNTER — Encounter (HOSPITAL_BASED_OUTPATIENT_CLINIC_OR_DEPARTMENT_OTHER): Payer: Medicaid Other | Admitting: Physician Assistant

## 2019-06-08 ENCOUNTER — Other Ambulatory Visit: Payer: Self-pay

## 2019-06-08 DIAGNOSIS — G9009 Other idiopathic peripheral autonomic neuropathy: Secondary | ICD-10-CM | POA: Diagnosis not present

## 2019-06-08 DIAGNOSIS — G629 Polyneuropathy, unspecified: Secondary | ICD-10-CM | POA: Diagnosis not present

## 2019-06-08 DIAGNOSIS — Z882 Allergy status to sulfonamides status: Secondary | ICD-10-CM | POA: Diagnosis not present

## 2019-06-08 DIAGNOSIS — Z833 Family history of diabetes mellitus: Secondary | ICD-10-CM | POA: Diagnosis not present

## 2019-06-08 DIAGNOSIS — L97522 Non-pressure chronic ulcer of other part of left foot with fat layer exposed: Secondary | ICD-10-CM | POA: Diagnosis not present

## 2019-06-08 DIAGNOSIS — I10 Essential (primary) hypertension: Secondary | ICD-10-CM | POA: Diagnosis not present

## 2019-06-08 DIAGNOSIS — L089 Local infection of the skin and subcutaneous tissue, unspecified: Secondary | ICD-10-CM | POA: Diagnosis not present

## 2019-06-08 DIAGNOSIS — Z21 Asymptomatic human immunodeficiency virus [HIV] infection status: Secondary | ICD-10-CM | POA: Diagnosis not present

## 2019-06-08 DIAGNOSIS — I252 Old myocardial infarction: Secondary | ICD-10-CM | POA: Diagnosis not present

## 2019-06-08 DIAGNOSIS — Z85048 Personal history of other malignant neoplasm of rectum, rectosigmoid junction, and anus: Secondary | ICD-10-CM | POA: Diagnosis not present

## 2019-06-08 DIAGNOSIS — J449 Chronic obstructive pulmonary disease, unspecified: Secondary | ICD-10-CM | POA: Diagnosis not present

## 2019-06-08 DIAGNOSIS — Z8249 Family history of ischemic heart disease and other diseases of the circulatory system: Secondary | ICD-10-CM | POA: Diagnosis not present

## 2019-06-08 NOTE — Progress Notes (Addendum)
UNIQUE, DARTY ([id number]) Visit Report for 06/08/2019 Chief Complaint Document Details Patient Name: Date of Service: Jeremiah [name], Jeremiah [name] 06/08/2019 8:15 AM Medical Record Number:[id number] Patient Account Number: [id number] Date of Birth/Sex: Treating RN: 07-Oct-1970 (49 y.o. Ernestene Mention Primary Care Provider: Jeanmarie Hubert Other Clinician: Referring Provider: Treating Provider/Extender:Stone III, Rometta Emery, MATTHEW Weeks in Treatment: 1 Information Obtained from: Patient Chief Complaint Left Medial 5th Toe Ulcer Electronic Signature(s) Signed: 06/08/2019 8:36:47 AM By: Worthy Keeler PA-C Entered By: Worthy Keeler on 06/08/2019 08:36:46 -------------------------------------------------------------------------------- HPI Details Patient Name: Date of Service: [name], Jeremiah 06/08/2019 8:15 AM Medical Record [id number] Patient Account Number: [id number] Date of Birth/Sex: Treating RN: 05-Oct-1970 (49 y.o. Ernestene Mention Primary Care Provider: Jeanmarie Hubert Other Clinician: Referring Provider: Treating Provider/Extender:Stone III, Rometta Emery, MATTHEW Weeks in Treatment: 1 History of Present Illness HPI Description: 06/01/2019 patient presents today for initial evaluation here in our clinic concerning issues that he has been having unfortunately for several weeks with a wound between his fourth and fifth toes of the left foot specifically this is against the fifth toe. He did have an x-ray which showed mild cortical irregularity along the medial aspect of the fifth proximal phalanx distally this does correspond with the area in question. With that being said the distal phalanx of the fifth toe was stated to be unlikely infection. Overall I did state that MRI would be helpful for further evaluation if any further medical treatment was necessitated at this point. With that being said the patient seems to barely have anything open although the area in  question is indeed the area that we saw on x-ray as well. No fevers, chills, nausea, vomiting, or diarrhea. The patient does unfortunately note that he is HIV positive. 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 He has also had rectal cancer 3 years ago. He does have neuropathy but unfortunately is still having pain in this toe region. He has not had an MRI at this point. 06/08/2019 upon evaluation today patient actually appears to be doing well with regard to the area between his toe. He tells me he never even put any medicine on the area that is never drained it does not appear to be open any longer. With that being said he is still having some discomfort. There was some irregularity on the x-ray again noted right around the area of the infection previous this could just be some relation to the abscess previous or could be osteomyelitis and really cannot be definitively noted. Either way the patient has not had an MRI at this point but with no open wound I am not really sure an MRI is necessary currently. I do think he may need an extended course of antibiotic therapy in order to make sure this completely clears and hopefully does not come back. Electronic Signature(s) Signed: 06/08/2019 8:51:29 AM By: Worthy Keeler PA-C Entered By: Worthy Keeler on 06/08/2019 08:51:28 -------------------------------------------------------------------------------- Paring/cutting 1 benign hyperkeratotic lesion Details Patient Name: Date of Service: Jeremiah, [name] 06/08/2019 8:15 AM Medical Record [id number] Patient Account Number: [id number] Date of Birth/Sex: Treating RN: [birth year] (49 y.o. Ernestene Mention Primary Care Provider: Jeanmarie Hubert Other Clinician: Referring Provider: Treating Provider/Extender:Stone III, Rometta Emery, MATTHEW Weeks in Treatment: 1 Procedure Performed for: Wound #1 Left,Medial Toe Fifth Performed By: Physician Worthy Keeler, PA Post Procedure  Diagnosis Same as Pre-procedure Notes using #3 curette Electronic Signature(s) Signed: 06/09/2019 12:37:21 PM By: Worthy Keeler PA-C Signed: 06/10/2019 6:14:01 PM By: Johna Roles  Jacques Navy, BSN Entered By: Baruch Gouty on 06/08/2019 08:45:37 -------------------------------------------------------------------------------- Physical Exam Details Patient Name: Date of Service: Jeremiah, [name] 06/08/2019 8:15 AM Medical Record [id number] Patient Account Number: [id number] Date of Birth/Sex: Treating RN: November 07, 1970 (50 y.o. Ernestene Mention Primary Care Provider: Jeanmarie Hubert Other Clinician: Referring Provider: Treating Provider/Extender:Stone III, Rometta Emery, MATTHEW Weeks in Treatment: 1 Constitutional Well-nourished and well-hydrated in no acute distress. Respiratory normal breathing without difficulty. Psychiatric this patient is able to make decisions and demonstrates good insight into disease process. Alert and Oriented x 3. pleasant and cooperative. Notes Patient's wound bed currently showed signs again of complete epithelization I did remove some callus that built up around the edge but I did not identify any open wound at all at this point which is actually very good news he still did have some discomfort but again this could be related to the resolving infection. Electronic Signature(s) Signed: 06/08/2019 8:51:47 AM By: Worthy Keeler PA-C Entered By: Worthy Keeler on 06/08/2019 08:51:46 -------------------------------------------------------------------------------- Physician Orders Details Patient Name: Date of Service: Jeremiah, [name] 06/08/2019 8:15 AM Medical Record [id number] Patient Account Number: [id number] Date of Birth/Sex: Treating RN: 1970-06-18 (49 y.o. Ernestene Mention Primary Care Provider: Jeanmarie Hubert Other Clinician: Referring Provider: Treating Provider/Extender:Stone III, Rometta Emery, MATTHEW Weeks in Treatment:  1 Verbal / Phone Orders: No Diagnosis Coding ICD-10 Coding Code Description G90.09 Other idiopathic peripheral autonomic neuropathy L97.522 Non-pressure chronic ulcer of other part of left foot with fat layer exposed B20 Human immunodeficiency virus [HIV] disease A53.0 Latent syphilis, unspecified as early or late Discharge From [facility] Services Discharge from [facility] Additional Orders / Instructions Other: - watch for drainage, swelling or worsening pain Other: - take antibiotics as prescribed until finished Patient Medications Allergies: Sulfa (Sulfonamide Antibiotics) Notifications Medication Indication Start End doxycycline hyclate 06/08/2019 DOSE 1 - oral 100 mg capsule - 1 capsule oral taken 2 times a day for 30 days. Do not take a multivitamin with this medication Electronic Signature(s) Signed: 06/08/2019 8:53:00 AM By: Worthy Keeler PA-C Entered By: Worthy Keeler on 06/08/2019 08:52:59 -------------------------------------------------------------------------------- Problem List Details Patient Name: Date of Service: AZAVIER, TOSTI 06/08/2019 8:15 AM Medical Record [id number] Patient Account Number: [id number] Date of Birth/Sex: Treating RN: [birth year] (49 y.o. Ernestene Mention Primary Care Provider: Jeanmarie Hubert Other Clinician: Referring Provider: Treating Provider/Extender:Stone III, Rometta Emery, MATTHEW Weeks in Treatment: 1 Active Problems ICD-10 Evaluated Encounter Code Description Active Date Today Diagnosis G90.09 Other idiopathic peripheral autonomic neuropathy 06/01/2019 No Yes L97.522 Non-pressure chronic ulcer of other part of left foot 06/01/2019 No Yes with fat layer exposed B20 Human immunodeficiency virus [HIV] disease 06/01/2019 No Yes A53.0 Latent syphilis, unspecified as early or late 06/01/2019 No Yes Inactive Problems Resolved Problems Electronic Signature(s) Signed: 06/08/2019 8:36:41 AM By: Worthy Keeler PA-C Entered  By: Worthy Keeler on 06/08/2019 08:36:41 -------------------------------------------------------------------------------- Progress Note Details Patient Name: Date of Service: LALIT, THELEN 06/08/2019 8:15 AM Medical Record [id number] Patient Account Number: [id number] Date of Birth/Sex: Treating RN: 05-29-1970 (48 y.o. Ernestene Mention Primary Care Provider: Jeanmarie Hubert Other Clinician: Referring Provider: Treating Provider/Extender:Stone III, Rometta Emery, MATTHEW Weeks in Treatment: 1 Subjective Chief Complaint Information obtained from Patient Left Medial 5th Toe Ulcer History of Present Illness (HPI) 06/01/2019 patient presents today for initial evaluation here in our clinic concerning issues that he has been having unfortunately for several weeks with a wound between his fourth and fifth toes of the left foot specifically this is against the  fifth toe. He did have an x-ray which showed mild cortical irregularity along the medial aspect of the fifth proximal phalanx distally this does correspond with the area in question. With that being said the distal phalanx of the fifth toe was stated to be unlikely infection. Overall I did state that MRI would be helpful for further evaluation if any further medical treatment was necessitated at this point. With that being said the patient seems to barely have anything open although the area in question is indeed the area that we saw on x-ray as well. No fevers, chills, nausea, vomiting, or diarrhea. The patient does unfortunately note that he is HIV positive. 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 He has also had rectal cancer 3 years ago. He does have neuropathy but unfortunately is still having pain in this toe region. He has not had an MRI at this point. 06/08/2019 upon evaluation today patient actually appears to be doing well with regard to the area between his toe. He tells me he never even put any medicine on  the area that is never drained it does not appear to be open any longer. With that being said he is still having some discomfort. There was some irregularity on the x-ray again noted right around the area of the infection previous this could just be some relation to the abscess previous or could be osteomyelitis and really cannot be definitively noted. Either way the patient has not had an MRI at this point but with no open wound I am not really sure an MRI is necessary currently. I do think he may need an extended course of antibiotic therapy in order to make sure this completely clears and hopefully does not come back. Objective Constitutional Well-nourished and well-hydrated in no acute distress. Vitals Time Taken: 8:00 AM, Height: 71 in, Weight: 155 lbs, BMI: 21.6, Temperature: 98.2 F, Pulse: 61 bpm, Respiratory Rate: 16 breaths/min, Blood Pressure: 155/77 mmHg. Respiratory normal breathing without difficulty. Psychiatric this patient is able to make decisions and demonstrates good insight into disease process. Alert and Oriented x 3. pleasant and cooperative. General Notes: Patient's wound bed currently showed signs again of complete epithelization I did remove some callus that built up around the edge but I did not identify any open wound at all at this point which is actually very good news he still did have some discomfort but again this could be related to the resolving infection. Integumentary (Hair, Skin) Wound #1 status is Healed - Epithelialized. Original cause of wound was Gradually Appeared. The wound is located on the Left,Medial Toe Fifth. The wound measures 0cm length x 0cm width x 0cm depth; 0cm^2 area and 0cm^3 volume. There is Fat Layer (Subcutaneous Tissue) Exposed exposed. There is no tunneling or undermining noted. There is a small amount of serous drainage noted. The wound margin is flat and intact. There is large (67-100%) pink, pale granulation within the wound  bed. There is no necrotic tissue within the wound bed. Assessment Active Problems ICD-10 Other idiopathic peripheral autonomic neuropathy Non-pressure chronic ulcer of other part of left foot with fat layer exposed Human immunodeficiency virus [HIV] disease Latent syphilis, unspecified as early or late Procedures Wound #1 Pre-procedure diagnosis of Wound #1 is a Neuropathic Ulcer-Non Diabetic located on the Left,Medial Toe Fifth . An Paring/cutting 1 benign hyperkeratotic lesion procedure was performed by Worthy Keeler, PA. Post procedure Diagnosis Wound #1: Same as Pre-Procedure Notes: using #3 curette Plan Discharge From [facility]  Services: Discharge from [place] Additional Orders / Instructions: Other: - watch for drainage, swelling or worsening pain Other: - take antibiotics as prescribed until finished The following medication(s) was prescribed: doxycycline hyclate oral 100 mg capsule 1 1 capsule oral taken 2 times a day for 30 days. Do not take a multivitamin with this medication starting 06/08/2019 1. My suggestion at this time is good to be that we go ahead and extend the antibiotic therapy the patient was on doxycycline, to give him a an additional months worth of medication at this point. He is in agreement with that plan and he will take that to completion. Once he is done with that hopefully he will not have the discomfort or anything that reopens in the interim. 2. With regard to the pain again I think the antibiotic therapy should help with getting this to completely resolve he has had no drainage no open wound for at least 2 weeks at this point I think overall he is doing well and is just a matter of time here. 3. If anything changes or worsens he begins to have any drainage or anything of that nature he will contact the office and let me know we will get him back in for further evaluation and treatment likely he would need an MRI at that point. We will see patient  back for reevaluation in 1 week here in the clinic. If anything worsens or changes patient will contact our office for additional recommendations. We will see the patient back for a follow-up visit as needed. Electronic Signature(s) Signed: 06/08/2019 8:53:58 AM By: Worthy Keeler PA-C Entered By: Worthy Keeler on 06/08/2019 08:53:57 -------------------------------------------------------------------------------- SuperBill Details Patient Name: Date of Service: ZACCHEUS, VALESQUEZ 06/08/2019 Medical Record [id number] Patient Account Number: [id number] Date of Birth/Sex: Treating RN: 1970/05/30 (49 y.o. Ernestene Mention Primary Care Provider: Jeanmarie Hubert Other Clinician: Referring Provider: Treating Provider/Extender:Stone III, Rometta Emery, MATTHEW Weeks in Treatment: 1 Diagnosis Coding ICD-10 Codes Code Description G90.09 Other idiopathic peripheral autonomic neuropathy L97.522 Non-pressure chronic ulcer of other part of left foot with fat layer exposed B20 Human immunodeficiency virus [HIV] disease A53.0 Latent syphilis, unspecified as early or late Facility Procedures CPT4 Code Description: [id number] - PARE BENIGN LES; SGL ICD-10 Diagnosis Description L97.522 Non-pressure chronic ulcer of other part of left foot wit Modifier: h fat layer expo Quantity: 1 sed Physician Procedures CPT4 Code Description: [id number] - WC PHYS LEVEL 4 - EST PT ICD-10 Diagnosis Description G90.09 Other idiopathic peripheral autonomic neuropathy L97.522 Non-pressure chronic ulcer of other part of left foot wit B20 Human immunodeficiency virus [HIV]  disease A53.0 Latent syphilis, unspecified as early or late Modifier: h fat layer exp Quantity: 1 osed CPT4 Code Description: [id number] - WC PHYS PARE BENIGN LES; SGL ICD-10 Diagnosis Description L97.522 Non-pressure chronic ulcer of other part of left foot with f Modifier: at layer expose Quantity: 1 d Electronic  Signature(s) Signed: 06/08/2019 8:54:29 AM By: Worthy Keeler PA-C Entered By: Worthy Keeler on 06/08/2019 08:54:29

## 2019-06-20 ENCOUNTER — Other Ambulatory Visit: Payer: Medicaid Other

## 2019-06-20 ENCOUNTER — Other Ambulatory Visit: Payer: Self-pay

## 2019-06-20 ENCOUNTER — Other Ambulatory Visit (HOSPITAL_COMMUNITY)
Admission: RE | Admit: 2019-06-20 | Discharge: 2019-06-20 | Disposition: A | Discharge status: Home / Self Care | Payer: Medicaid Other | Source: Ambulatory Visit | Attending: Internal Medicine | Admitting: Internal Medicine

## 2019-06-20 DIAGNOSIS — Z79899 Other long term (current) drug therapy: Secondary | ICD-10-CM

## 2019-06-20 DIAGNOSIS — B20 Human immunodeficiency virus [HIV] disease: Secondary | ICD-10-CM | POA: Insufficient documentation

## 2019-06-20 DIAGNOSIS — Z113 Encounter for screening for infections with a predominantly sexual mode of transmission: Secondary | ICD-10-CM

## 2019-06-21 LAB — URINE CYTOLOGY ANCILLARY ONLY
Chlamydia: NEGATIVE
Comment: NEGATIVE
Comment: NORMAL
Neisseria Gonorrhea: NEGATIVE

## 2019-06-21 LAB — T-HELPER CELL (CD4) - (RCID CLINIC ONLY)
CD4 % Helper T Cell: 15 % — ABNORMAL LOW (ref 33–65)
CD4 T Cell Abs: 324 /uL — ABNORMAL LOW (ref 400–1790)

## 2019-06-23 LAB — CBC WITH DIFFERENTIAL/PLATELET
Absolute Monocytes: 747 cells/uL (ref 200–950)
Basophils Absolute: 29 cells/uL (ref 0–200)
Basophils Relative: 0.5 %
Eosinophils Absolute: 120 cells/uL (ref 15–500)
Eosinophils Relative: 2.1 %
HCT: 44.4 % (ref 38.5–50.0)
Hemoglobin: 15.6 g/dL (ref 13.2–17.1)
Lymphs Abs: 1995 cells/uL (ref 850–3900)
MCH: 36.8 pg — ABNORMAL HIGH (ref 27.0–33.0)
MCHC: 35.1 g/dL (ref 32.0–36.0)
MCV: 104.7 fL — ABNORMAL HIGH (ref 80.0–100.0)
MPV: 9.9 fL (ref 7.5–12.5)
Monocytes Relative: 13.1 %
Neutro Abs: 2810 cells/uL (ref 1500–7800)
Neutrophils Relative %: 49.3 %
Platelets: 166 10*3/uL (ref 140–400)
RBC: 4.24 10*6/uL (ref 4.20–5.80)
RDW: 12.9 % (ref 11.0–15.0)
Total Lymphocyte: 35 %
WBC: 5.7 10*3/uL (ref 3.8–10.8)

## 2019-06-23 LAB — LIPID PANEL
Cholesterol: 181 mg/dL (ref <200)
HDL: 77 mg/dL (ref >=40)
LDL Cholesterol (Calc): 81 mg/dL (calc)
Non-HDL Cholesterol (Calc): 104 mg/dL (calc) (ref <130)
Total CHOL/HDL Ratio: 2.4 (calc) (ref <5.0)
Triglycerides: 130 mg/dL (ref <150)

## 2019-06-23 LAB — COMPLETE METABOLIC PANEL WITH GFR
AG Ratio: 2 (calc) (ref 1.0–2.5)
ALT: 18 U/L (ref 9–46)
AST: 32 U/L (ref 10–40)
Albumin: 4.7 g/dL (ref 3.6–5.1)
Alkaline phosphatase (APISO): 40 U/L (ref 36–130)
BUN: 19 mg/dL (ref 7–25)
CO2: 29 mmol/L (ref 20–32)
Calcium: 9.7 mg/dL (ref 8.6–10.3)
Chloride: 105 mmol/L (ref 98–110)
Creat: 1.32 mg/dL (ref 0.60–1.35)
GFR, Est African American: 73 mL/min/{1.73_m2} (ref >=60)
GFR, Est Non African American: 63 mL/min/{1.73_m2} (ref >=60)
Globulin: 2.4 g/dL (calc) (ref 1.9–3.7)
Glucose, Bld: 72 mg/dL (ref 65–99)
Potassium: 4.3 mmol/L (ref 3.5–5.3)
Sodium: 139 mmol/L (ref 135–146)
Total Bilirubin: 1.9 mg/dL — ABNORMAL HIGH (ref 0.2–1.2)
Total Protein: 7.1 g/dL (ref 6.1–8.1)

## 2019-06-23 LAB — HIV-1 RNA QUANT-NO REFLEX-BLD
HIV 1 RNA Quant: <20 copies/mL
HIV-1 RNA Quant, Log: <1.3 Log copies/mL

## 2019-06-23 LAB — RPR TITER: RPR Titer: 1:2 {titer} — ABNORMAL HIGH

## 2019-06-23 LAB — FLUORESCENT TREPONEMAL AB(FTA)-IGG-BLD: Fluorescent Treponemal ABS: REACTIVE — AB

## 2019-06-23 LAB — RPR: RPR Ser Ql: REACTIVE — AB

## 2019-07-05 ENCOUNTER — Ambulatory Visit (INDEPENDENT_AMBULATORY_CARE_PROVIDER_SITE_OTHER): Payer: Medicaid Other | Admitting: Internal Medicine

## 2019-07-05 ENCOUNTER — Other Ambulatory Visit: Payer: Self-pay

## 2019-07-05 ENCOUNTER — Encounter: Payer: Self-pay | Admitting: Internal Medicine

## 2019-07-05 DIAGNOSIS — B20 Human immunodeficiency virus [HIV] disease: Secondary | ICD-10-CM | POA: Diagnosis not present

## 2019-07-05 NOTE — Assessment & Plan Note (Signed)
His infection remains under excellent, long-term control.  He will continue Biktarvy and follow-up after lab work in 1 year. 

## 2019-07-05 NOTE — Progress Notes (Signed)
Patient Active Problem List   Diagnosis Date Noted  . Pneumothorax on left 06/30/2018    Priority: High  . 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[place]) 01/06/2018    Priority: High  . Anal cancer ([place]) 11/07/2015    Priority: Medium  . Human immunodeficiency virus (HIV) disease ([place]) 05/19/2013    Priority: Medium  . Foot pain, left 05/20/2019  . Hypertension 02/21/2019  . Screening for diabetes mellitus 02/21/2019  . Anxiety 09/29/2018  . Lung nodule 06/23/2018  . Dyslipidemia 05/19/2013  . Cigarette smoker 05/19/2013  . Depression 05/19/2013  . Dental caries 05/19/2013  . Hx of unilateral orchiectomy 05/19/2013  . Latent syphilis 05/06/2013  . Anal warts 05/06/2013  . History of chlamydia 05/06/2013    Patient's Medications  New Prescriptions   No medications on file  Previous Medications   ALBUTEROL (VENTOLIN HFA) 108 (90 BASE) MCG/ACT INHALER    Inhale 1-2 puffs into the lungs every 6 (six) hours as needed for wheezing or shortness of breath.   BICTEGRAVIR-EMTRICITABINE-TENOFOVIR AF (BIKTARVY) 50-200-25 MG TABS TABLET    Take 1 tablet by mouth daily.   HYDROCHLOROTHIAZIDE (HYDRODIURIL) 25 MG TABLET    TAKE 1 TABLET(25 MG) BY MOUTH DAILY   MULTIPLE VITAMIN (MULTI-VITAMIN) TABLET    Take by mouth.   NICOTINE (NICODERM CQ - DOSED IN MG/24 HOURS) 14 MG/24HR PATCH    Place 1 patch (14 mg total) onto the skin daily.   VARENICLINE (CHANTIX PAK) 0.5 MG X 11 & 1 MG X 42 TABLET    Take one 0.5 mg tablet by mouth once daily for 3 days, then increase to one 0.5 mg tablet twice daily for 4 days, then increase to one 1 mg tablet twice daily.  Modified Medications   No medications on file  Discontinued Medications   No medications on file    Subjective: Jeremiah [name] is in for his routine HIV follow-up visit.  He has not had any problems obtaining, taking or tolerating his Biktarvy.  He takes it each morning when he first wakes up.  He does not recall missing any doses.  He is trying to quit  smoking.  He is down to about cigarettes daily.  Review of Systems: Review of Systems  Constitutional: Negative for fever and weight loss.    Past Medical History:  Diagnosis Date  . Anal cancer ([place])   . Anal warts   . Carpal tunnel syndrome    Right  . COPD (chronic obstructive pulmonary disease) ([place])   . Depression   . Hemorrhoids 02/25/2015  . History of tuberculin skin testing within last year 2014  . HIV (human immunodeficiency virus infection) ([place])   . Hypertension   . Latent syphilis   . Lung nodule    Right upper lobe  . Pneumonia 2007   x2  . Pneumothorax 07/01/2018   LEFT  . Rectal cancer [facility])     Social History   Tobacco Use  . Smoking status: Current Every Day Smoker    Packs/day: 0.10    Years: 27.00    Pack years: 2.70    Types: Cigars  . Smokeless tobacco: Never Used  . Tobacco comment: couple cigars per day  Substance Use Topics  . Alcohol use: Yes    Alcohol/week: 0.0 standard drinks    Comment: less than 40 oz a week  . Drug use: Not Currently    Comment: Past history of crack cocaine  use  Family History  Problem Relation Age of Onset  . Diabetes Mother   . Hypertension Mother   . Cancer Mother        breast  . Diabetes Father   . Hypertension Father     Allergies  Allergen Reactions  . Nsaids Other (See Comments)    DRUG INTERACTION WITH HIV MEDS  . Sulfa Antibiotics Itching    Health Maintenance  Topic Date Due  . TETANUS/TDAP  05/19/2029  . INFLUENZA VACCINE  Completed  . HIV Screening  Completed    Objective:  Vitals:   07/05/19 0843  Temp: 98.5 F (36.9 C)  TempSrc: Oral  Weight: 162 lb (73.5 kg)   Body mass index is 22.59 kg/m.  Physical Exam Constitutional:      Comments: He has gained 10 pounds since his last visit.  He is in good spirits.  Cardiovascular:     Rate and Rhythm: Normal rate and regular rhythm.     Heart sounds: No murmur.  Pulmonary:     Effort: Pulmonary effort is normal.      Breath sounds: Normal breath sounds.  Psychiatric:        Mood and Affect: Mood normal.     Lab Results Lab Results  Component Value Date   WBC 5.7 06/20/2019   HGB 15.6 06/20/2019   HCT 44.4 06/20/2019   MCV 104.7 (H) 06/20/2019   PLT 166 06/20/2019    Lab Results  Component Value Date   CREATININE 1.32 06/20/2019   BUN 19 06/20/2019   NA 139 06/20/2019   K 4.3 06/20/2019   CL 105 06/20/2019   CO2 29 06/20/2019    Lab Results  Component Value Date   ALT 18 06/20/2019   AST 32 06/20/2019   ALKPHOS 57 08/15/2018   BILITOT 1.9 (H) 06/20/2019    Lab Results  Component Value Date   CHOL 181 06/20/2019   HDL 77 06/20/2019   LDLCALC 81 06/20/2019   TRIG 130 06/20/2019   CHOLHDL 2.4 06/20/2019   Lab Results  Component Value Date   LABRPR REACTIVE (A) 06/20/2019   RPRTITER 1:2 (H) 06/20/2019   HIV 1 RNA Quant (copies/mL)  Date Value  06/20/2019 <20 NOT DETECTED  03/08/2019 <20 NOT DETECTED  09/29/2018 <20 NOT DETECTED   CD4 T Cell Abs (/uL)  Date Value  06/20/2019 324 (L)  03/08/2019 239 (L)  09/29/2018 176 (L)     Problem List Items Addressed This Visit      Medium   Human immunodeficiency virus (HIV) disease ([place])    His infection remains under excellent, long-term control.  He will continue Biktarvy and follow-up after lab work in 1 year.      Relevant Orders   CBC   T-helper cell (CD4)- ([facility] clinic only)   Comprehensive metabolic panel   Lipid panel   RPR   HIV-1 RNA quant-no reflex-bld        Michel Bickers, MD [facility] for [place] [phone] pager   [phone] cell 07/05/2019, 8:57 AM

## 2019-08-02 ENCOUNTER — Ambulatory Visit (INDEPENDENT_AMBULATORY_CARE_PROVIDER_SITE_OTHER): Payer: Medicaid Other | Admitting: Internal Medicine

## 2019-08-02 ENCOUNTER — Encounter: Payer: Self-pay | Admitting: Internal Medicine

## 2019-08-02 DIAGNOSIS — K602 Anal fissure, unspecified: Secondary | ICD-10-CM | POA: Diagnosis not present

## 2019-08-02 MED ORDER — LIDOCAINE HCL 2 % EX GEL: 1.0000 "application " | CUTANEOUS | 0 refills | Status: DC | PRN

## 2019-08-02 NOTE — Assessment & Plan Note (Addendum)
Patient presenting to the [facility] after having anal sexual intercourse with a partner a week ago. Patient endorses pain on defecation and one episode of bright red blood on his stool. Patient states that sitting in a warm sitz bath helps alleviate his pain. Pain is aggravated by defecation. Patient also endorses a rash he noticed at the site on 07/30/2019.   Assessment:  Patient with complex PMH of anal cancer, anal warts, and hemorrhoids, with pain on defecation and rash after recent trauma. Physical examination shows no gross abnormalities on physical examination. There is a skin tag at the perineum, no pain to palpation around the sphincter, but pain elicited with initial insertion of the sphincter. In the presence of recent trauma, pain on initial insertion, diagnosis seems likely anal fissure vs hemorrhoid.   Plan:  - Continue Sitz bath - lidocaine gel 2% PRN

## 2019-08-02 NOTE — Progress Notes (Signed)
   CC: Buttock Rash  HPI:  Jeremiah [name] is a 49 y.o. male, with a PMH noted below, who presents to the [facility] with complaints of a buttock rash. To see the management of his acute and chronic conditions, please refer to the A&P note.   Past Medical History:  Diagnosis Date  . Anal cancer ([place])   . Anal warts   . Carpal tunnel syndrome    Right  . COPD (chronic obstructive pulmonary disease) ([place])   . Depression   . Hemorrhoids 02/25/2015  . History of tuberculin skin testing within last year 2014  . HIV (human immunodeficiency virus infection) ([place])   . Hypertension   . Latent syphilis   . Lung nodule    Right upper lobe  . Pneumonia 2007   x2  . Pneumothorax 07/01/2018   LEFT  . Rectal cancer ([place])    Review of Systems:   Review of Systems  Constitutional: Negative for chills, fever, malaise/fatigue and weight loss.  Eyes: Negative for blurred vision.  Cardiovascular: Negative for chest pain.  Gastrointestinal: Positive for blood in stool. Negative for abdominal pain, constipation, diarrhea, melena, nausea and vomiting.       One episode of blood on the stool.   Neurological: Negative for dizziness and headaches.     Physical Exam:  Vitals:   08/02/19 0903  BP: 109/78  Pulse: 63  Temp: 98.6 F (37 C)  TempSrc: Oral  SpO2: 100%  Weight: 159 lb 8 oz (72.3 kg)  Height: 5\' 11"  (1.803 m)   Physical Exam Constitutional:      General: He is not in acute distress.    Appearance: Normal appearance. He is not ill-appearing or toxic-appearing.  HENT:     Head: Normocephalic and atraumatic.  Cardiovascular:     Rate and Rhythm: Normal rate and regular rhythm.     Pulses: Normal pulses.     Heart sounds: Normal heart sounds. No murmur. No friction rub. No gallop.   Pulmonary:     Effort: Pulmonary effort is normal.     Breath sounds: Normal breath sounds. No wheezing, rhonchi or rales.  Abdominal:     General: Abdomen is flat. Bowel sounds are normal.   Palpations: Abdomen is soft.     Tenderness: There is no abdominal tenderness. There is no guarding.  Genitourinary:    Comments: Rectal Examination: anal mucosa without signs of mass, gross blood, abscess, or erythema. Skin tag present at the perineum. Pain on insertion of lubricated digit. Skin:    General: Skin is warm.     Findings: No bruising, erythema or lesion.  Neurological:     General: No focal deficit present.     Mental Status: He is alert and oriented to person, place, and time.  Psychiatric:        Mood and Affect: Mood normal.        Behavior: Behavior normal.     Assessment & Plan:   See Encounters Tab for problem based charting.  Patient discussed with Dr. Lynnae January

## 2019-08-02 NOTE — Patient Instructions (Signed)
To Mr. Lewan,  Today we discussed your buttock rash. Continuing soaking the affected area, and apply the topical analgesic as needed to the affected area. Have a good day!  Maudie Mercury, MD

## 2019-08-03 NOTE — Progress Notes (Signed)
Jeremiah [name], Jeremiah [name] ([id number]) Visit Report for 06/08/2019 Arrival Information Details Patient Name: Date of Service: Jeremiah [name], Jeremiah [name] 06/08/2019 8:15 AM Medical Record Number:[id number] Patient Account Number: [id number] Date of Birth/Sex: Treating RN: Jul 03, 1970 (49 y.o. Ernestene Mention Primary Care Janson Lamar: Jeanmarie Hubert Other Clinician: Referring Helon Wisinski: Treating Ausha Sieh/Extender:Stone III, Rometta Emery, MATTHEW Weeks in Treatment: 1 Visit Information History Since Last Visit Added or deleted any medications: No Patient Arrived: Ambulatory Any new allergies or adverse reactions: No Arrival Time: 07:59 Had a fall or experienced change in No Accompanied By: self activities of daily living that may affect Transfer Assistance: None risk of falls: Patient Has Alerts: Yes Signs or symptoms of abuse/neglect since last No Patient Alerts: HIV positive visito Hospitalized since last visit: No Implantable device outside of the clinic excluding No cellular tissue based products placed in the center since last visit: Has Dressing in Place as Prescribed: Yes Pain Present Now: No Electronic Signature(s) Signed: 08/03/2019 9:23:26 AM By: Sandre Kitty Entered By: Sandre Kitty on 06/08/2019 07:59:49 -------------------------------------------------------------------------------- Lower Extremity Assessment Details Patient Name: Date of Service: Jeremiah [name], Jeremiah [name] 06/08/2019 8:15 AM Medical Record [id number] Patient Account Number: [id number] Date of Birth/Sex: Treating RN: [birth year] (49 y.o. Janyth Contes Primary Care Kenney Going: Jeanmarie Hubert Other Clinician: Referring Lorren Splawn: Treating Ulah Olmo/Extender:Stone III, Rometta Emery, MATTHEW Weeks in Treatment: 1 Edema Assessment Assessed: [Left: No] [Right: No] Edema: [Left: N] [Right: o] Calf Left: Right: Point of Measurement: 36 cm From Medial Instep 34.2 cm cm Ankle Left: Right: Point of Measurement: 12  cm From Medial Instep 21.5 cm cm Vascular Assessment Pulses: Dorsalis Pedis Palpable: [Left:Yes] Electronic Signature(s) Signed: 06/16/2019 8:56:47 AM By: Levan Hurst RN, BSN Entered By: Levan Hurst on 06/08/2019 08:08:50 -------------------------------------------------------------------------------- Multi-Disciplinary Care Plan Details Patient Name: Date of Service: Jeremiah [name], Jeremiah [name] 06/08/2019 8:15 AM Medical Record [id number] Patient Account Number: [id number] Date of Birth/Sex: Treating RN: November 05, 1970 (49 y.o. Ernestene Mention Primary Care Hermina Barnard: Jeanmarie Hubert Other Clinician: Referring Elliott Quade: Treating Nell Gales/Extender:Stone III, Rometta Emery, MATTHEW Weeks in Treatment: 1 Active Inactive Electronic Signature(s) Signed: 06/10/2019 6:14:01 PM By: Baruch Gouty RN, BSN Entered By: Baruch Gouty on 06/08/2019 08:52:36 -------------------------------------------------------------------------------- Pain Assessment Details Patient Name: Date of Service: Jeremiah [name], Jeremiah [name] 06/08/2019 8:15 AM Medical Record [id number] Patient Account Number: [id number] Date of Birth/Sex: Treating RN: 11/08/1970 (49 y.o. Ernestene Mention Primary Care Damarco Keysor: Jeanmarie Hubert Other Clinician: Referring Lourine Alberico: Treating Loreto Loescher/Extender:Stone III, Rometta Emery, MATTHEW Weeks in Treatment: 1 Active Problems Location of Pain Severity and Description of Pain Patient Has Paino No Site Locations Pain Management and Medication Current Pain Management: Electronic Signature(s) Signed: 06/10/2019 6:14:01 PM By: Baruch Gouty RN, BSN Signed: 08/03/2019 9:23:26 AM By: Sandre Kitty Entered By: Sandre Kitty on 06/08/2019 08:01:49 -------------------------------------------------------------------------------- Patient/Caregiver Education Details Patient Name: Date of Service: Jeremiah [name], Jeremiah [name] 2/17/2021andnbsp8:15 AM Medical Record [id number] Patient  Account Number: [id number] Date of Birth/Gender: Treating RN: Dec 25, 1970 (49 y.o. Ernestene Mention Primary Care Physician: Jeanmarie Hubert Other Clinician: Referring Physician: Treating Physician/Extender:Stone III, Rometta Emery, MATTHEW Weeks in Treatment: 1 Education Assessment Education Provided To: Patient Education Topics Provided Wound/Skin Impairment: Methods: Explain/Verbal Responses: Reinforcements needed, State content correctly Electronic Signature(s) Signed: 06/10/2019 6:14:01 PM By: Baruch Gouty RN, BSN Entered By: Baruch Gouty on 06/08/2019 08:41:49 -------------------------------------------------------------------------------- Wound Assessment Details Patient Name: Date of Service: Jeremiah [name], Jeremiah [name] 06/08/2019 8:15 AM Medical Record [id number] Patient Account Number: [id number] Date of Birth/Sex: Treating RN: [birth year] (49 y.o. Ernestene Mention Primary Care Labrandon Knoch: Jeanmarie Hubert Other Clinician: Referring Ladana Chavero: Treating Shamiah Kahler/Extender:Stone III, Rometta Emery, MATTHEW Weeks in  Treatment: 1 Wound Status Wound Number: 1 Primary Neuropathic Ulcer-Non Diabetic Etiology: Wound Location: Left Toe Fifth - Medial Wound Healed - Epithelialized Wounding Event: Gradually Appeared Status: Date Acquired: 05/16/2019 Comorbid Human Immunodeficiency Virus, Chronic Weeks Of Treatment: 1 History: Obstructive Pulmonary Disease (COPD), Clustered Wound: No Pneumothorax, Neuropathy, Received Chemotherapy, Received Radiation Photos Wound Measurements Length: (cm) 0 % Reduct Width: (cm) 0 % Reduct Depth: (cm) 0 Epitheli Area: (cm) 0 Tunneli Volume: (cm) 0 Undermi Wound Description Classification: Full Thickness Without Exposed Support Foul Odo Structures Slough/F Wound Flat and Intact Margin: Exudate Small Amount: Exudate Serous Type: Exudate amber Color: Wound Bed Granulation Amount: Large (67-100%) Granulation Quality: Pink,  Pale Fascia E Necrotic Amount: None Present (0%) Fat Laye Tendon E Muscle E Joint Expos Bone Expose r After Cleansing: No ibrino No Exposed Structure xposed: No r (Subcutaneous Tissue) Exposed: Yes xposed: No xposed: No ed: No d: No ion in Area: 100% ion in Volume: 100% alization: Large (67-100%) ng: No ning: No Electronic Signature(s) Signed: 06/08/2019 4:27:12 PM By: Mikeal Hawthorne EMT/HBOT Signed: 06/10/2019 6:14:01 PM By: Baruch Gouty RN, BSN Entered By: Mikeal Hawthorne on 06/08/2019 14:35:16 -------------------------------------------------------------------------------- Vitals Details Patient Name: Date of Service: Jeremiah [name], Jeremiah [name] 06/08/2019 8:15 AM Medical Record [id number] Patient Account Number: [id number] Date of Birth/Sex: Treating RN: March 22, 1971 (49 y.o. Ernestene Mention Primary Care Charelle Petrakis: Jeanmarie Hubert Other Clinician: Referring Lydiana Milley: Treating Marielena Harvell/Extender:Stone III, Rometta Emery, MATTHEW Weeks in Treatment: 1 Vital Signs Time Taken: 08:00 Temperature (F): 98.2 Height (in): 71 Pulse (bpm): 61 Weight (lbs): 155 Respiratory Rate (breaths/min): 16 Body Mass Index (BMI): 21.6 Blood Pressure (mmHg): 155/77 Reference Range: 80 - 120 mg / dl Electronic Signature(s) Signed: 08/03/2019 9:23:26 AM By: Sandre Kitty Entered By: Sandre Kitty on 06/08/2019 08:01:45

## 2019-08-08 NOTE — Progress Notes (Signed)
Internal Medicine Clinic Attending  Case discussed with Dr. Winters at the time of the visit.  We reviewed the resident's history and exam and pertinent patient test results.  I agree with the assessment, diagnosis, and plan of care documented in the resident's note.  

## 2019-08-09 ENCOUNTER — Encounter: Payer: Self-pay | Admitting: *Deleted

## 2019-09-02 ENCOUNTER — Telehealth: Payer: Self-pay | Admitting: Internal Medicine

## 2019-09-02 NOTE — Telephone Encounter (Signed)
Returned call to patient. C/o 1 day hx cough productive of "little" yellow sputum, yellow nasal drainage, and bilat cheek pain. Taking Tylenol sinus with some relief. Encouraged patient in comfort measures: rest, drink lots of water, may use albuterol inhaler q 4-6 hours prn next several days. Advised to call back if develops fever, worsening sx or sx last > 7 days. States he will. Hubbard Hartshorn, BSN, RN-BC

## 2019-09-02 NOTE — Telephone Encounter (Signed)
Pls contact regarding sinus infection [phone]

## 2019-09-05 ENCOUNTER — Ambulatory Visit: Payer: Medicaid Other

## 2019-09-05 DIAGNOSIS — Z85048 Personal history of other malignant neoplasm of rectum, rectosigmoid junction, and anus: Secondary | ICD-10-CM | POA: Diagnosis not present

## 2019-09-16 ENCOUNTER — Other Ambulatory Visit: Payer: Self-pay

## 2019-09-16 DIAGNOSIS — B20 Human immunodeficiency virus [HIV] disease: Secondary | ICD-10-CM

## 2019-09-16 MED ORDER — BIKTARVY 50-200-25 MG PO TABS: 1.0000 | ORAL_TABLET | Freq: Every day | ORAL | 5 refills | Status: DC

## 2019-09-26 ENCOUNTER — Ambulatory Visit: Payer: Medicaid Other

## 2019-09-27 ENCOUNTER — Encounter: Payer: Self-pay | Admitting: Internal Medicine

## 2019-10-01 ENCOUNTER — Encounter (HOSPITAL_COMMUNITY): Payer: Self-pay

## 2019-10-01 ENCOUNTER — Encounter (HOSPITAL_COMMUNITY): Payer: Self-pay | Admitting: *Deleted

## 2019-10-01 ENCOUNTER — Emergency Department (HOSPITAL_COMMUNITY)
Admission: EM | Admit: 2019-10-01 | Discharge: 2019-10-01 | Disposition: A | Discharge status: Home / Self Care | Payer: Medicaid Other | Attending: Emergency Medicine | Admitting: Emergency Medicine

## 2019-10-01 ENCOUNTER — Emergency Department (HOSPITAL_COMMUNITY): Payer: Medicaid Other

## 2019-10-01 ENCOUNTER — Other Ambulatory Visit: Payer: Self-pay

## 2019-10-01 ENCOUNTER — Emergency Department (HOSPITAL_COMMUNITY)
Admission: EM | Admit: 2019-10-01 | Discharge: 2019-10-02 | Disposition: A | Discharge status: Home / Self Care | Payer: Medicaid Other | Source: Home / Self Care | Attending: Emergency Medicine | Admitting: Emergency Medicine

## 2019-10-01 DIAGNOSIS — F17210 Nicotine dependence, cigarettes, uncomplicated: Secondary | ICD-10-CM | POA: Insufficient documentation

## 2019-10-01 DIAGNOSIS — R0602 Shortness of breath: Secondary | ICD-10-CM | POA: Diagnosis not present

## 2019-10-01 DIAGNOSIS — R05 Cough: Secondary | ICD-10-CM | POA: Diagnosis not present

## 2019-10-01 DIAGNOSIS — J449 Chronic obstructive pulmonary disease, unspecified: Secondary | ICD-10-CM | POA: Insufficient documentation

## 2019-10-01 DIAGNOSIS — R0781 Pleurodynia: Secondary | ICD-10-CM | POA: Insufficient documentation

## 2019-10-01 DIAGNOSIS — Z79899 Other long term (current) drug therapy: Secondary | ICD-10-CM | POA: Insufficient documentation

## 2019-10-01 DIAGNOSIS — B20 Human immunodeficiency virus [HIV] disease: Secondary | ICD-10-CM | POA: Insufficient documentation

## 2019-10-01 LAB — CBC WITH DIFFERENTIAL/PLATELET
Abs Immature Granulocytes: 0.02 10*3/uL (ref 0.00–0.07)
Basophils Absolute: 0 10*3/uL (ref 0.0–0.1)
Basophils Relative: 0 %
Eosinophils Absolute: 0.1 10*3/uL (ref 0.0–0.5)
Eosinophils Relative: 2 %
HCT: 40.9 % (ref 39.0–52.0)
Hemoglobin: 14.4 g/dL (ref 13.0–17.0)
Immature Granulocytes: 1 %
Lymphocytes Relative: 30 %
Lymphs Abs: 1.2 10*3/uL (ref 0.7–4.0)
MCH: 36.5 pg — ABNORMAL HIGH (ref 26.0–34.0)
MCHC: 35.2 g/dL (ref 30.0–36.0)
MCV: 103.8 fL — ABNORMAL HIGH (ref 80.0–100.0)
Monocytes Absolute: 0.4 10*3/uL (ref 0.1–1.0)
Monocytes Relative: 10 %
Neutro Abs: 2.3 10*3/uL (ref 1.7–7.7)
Neutrophils Relative %: 57 %
Platelets: 169 10*3/uL (ref 150–400)
RBC: 3.94 MIL/uL — ABNORMAL LOW (ref 4.22–5.81)
RDW: 11.4 % — ABNORMAL LOW (ref 11.5–15.5)
WBC: 3.9 10*3/uL — ABNORMAL LOW (ref 4.0–10.5)
nRBC: 0 % (ref 0.0–0.2)

## 2019-10-01 LAB — BASIC METABOLIC PANEL
Anion gap: 10 (ref 5–15)
BUN: 9 mg/dL (ref 6–20)
CO2: 23 mmol/L (ref 22–32)
Calcium: 8.7 mg/dL — ABNORMAL LOW (ref 8.9–10.3)
Chloride: 103 mmol/L (ref 98–111)
Creatinine, Ser: 0.96 mg/dL (ref 0.61–1.24)
GFR calc Af Amer: >60 mL/min (ref >60)
GFR calc non Af Amer: >60 mL/min (ref >60)
Glucose, Bld: 113 mg/dL — ABNORMAL HIGH (ref 70–99)
Potassium: 3.4 mmol/L — ABNORMAL LOW (ref 3.5–5.1)
Sodium: 136 mmol/L (ref 135–145)

## 2019-10-01 MED ORDER — SODIUM CHLORIDE 0.9% FLUSH: 3.0000 mL | Freq: Once | INTRAVENOUS | Status: DC

## 2019-10-01 NOTE — ED Triage Notes (Signed)
Pt arrives to ED w/ c/o cough x 3 weeks, pt states he has been coughing up black mucous. Pt endorses intermittent sob, denies fevers, chills, body aches.

## 2019-10-01 NOTE — ED Notes (Signed)
Called pt to recheck vitals. No response.  

## 2019-10-01 NOTE — ED Provider Notes (Signed)
[place] DEPT Provider Note   CSN: [id number] Arrival date & time: 10/01/19  1920     History Chief Complaint  Patient presents with  . Chest Pain    Jeremiah [name] is a 49 y.o. male.  Patient presenting with a complaint of cough for 3 days.  Patient states he has been coughing up what appears to be black mucus.  Patient endorses intermittent shortness of breath.  Does have some discomfort with the cough on the right side of the chest anteriorly.  Denies fever chills or body aches.  Slight shortness of breath.  Patient's had right-sided thoracotomy apparently for benign pulmonary nodule.  Patient is followed by infectious disease for HIV.  Review of past charting seems that patient had a video-assisted thoracoscopy.  With a wedge resection which was done in March 2020.        Past Medical History:  Diagnosis Date  . Anal cancer ([place])   . Anal warts   . Carpal tunnel syndrome    Right  . COPD (chronic obstructive pulmonary disease) ([place])   . Depression   . Hemorrhoids 02/25/2015  . History of tuberculin skin testing within last year 2014  . HIV (human immunodeficiency virus infection) ([place])   . Hypertension   . Latent syphilis   . Lung nodule    Right upper lobe  . Pneumonia 2007   x2  . Pneumothorax 07/01/2018   LEFT  . Rectal cancer [facility])     Patient Active Problem List   Diagnosis Date Noted  . Anal fissure 08/02/2019  . Foot pain, left 05/20/2019  . Hypertension 02/21/2019  . Screening for diabetes mellitus 02/21/2019  . Anxiety 09/29/2018  . Pneumothorax on left 06/30/2018  . Lung nodule 06/23/2018  . 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[place]) 01/06/2018  . Anal cancer ([place]) 11/07/2015  . Human immunodeficiency virus (HIV) disease ([place]) 05/19/2013  . Dyslipidemia 05/19/2013  . Cigarette smoker 05/19/2013  . Depression 05/19/2013  . Dental caries 05/19/2013  . Hx of unilateral orchiectomy 05/19/2013  . Latent syphilis 05/06/2013  .  Anal warts 05/06/2013  . History of chlamydia 05/06/2013    Past Surgical History:  Procedure Laterality Date  . CHEST TUBE INSERTION ([place] HX) Left 06/30/2018  . CONDYLOMA EXCISION/FULGURATION    . HERNIA REPAIR  1991   Abdominal   . IR RADIOLOGIST EVAL & MGMT  05/17/2019  . IR RADIOLOGY PERIPHERAL GUIDED IV START  05/20/2019  . IR TRANSCATH RETRIEVAL FB INCL GUIDANCE (MS)  05/20/2019  . IR US GUIDE VASC ACCESS RIGHT  05/20/2019  . PORT-A-CATH REMOVAL N/A 05/11/2019   Procedure: PARTIAL PORT REMOVAL;  Surgeon: Leighton Ruff, MD;  [facility];  Service: General;  Laterality: N/A;  . unilateral orchiectomy    . VIDEO ASSISTED THORACOSCOPY (VATS)/WEDGE RESECTION Right 06/23/2018   Procedure: VIDEO ASSISTED THORACOSCOPY (VATS)/LUNG RESECTION, stapling and dissection of apical bleb, wedge resection of right upper lobe mass, lymph node dissection, intercostal nerve block;  Surgeon: Grace Isaac, MD;  Location: [place];  Service: Thoracic;  Laterality: Right;  Marland Kitchen VIDEO BRONCHOSCOPY N/A 06/23/2018   Procedure: VIDEO BRONCHOSCOPY;  Surgeon: Grace Isaac, MD;  Location: [facility] OR;  Service: Thoracic;  Laterality: N/A;       Family History  Problem Relation Age of Onset  . Diabetes Mother   . Hypertension Mother   . Cancer Mother        breast  . Diabetes Father   . Hypertension Father  Social History   Tobacco Use  . Smoking status: Current Every Day Smoker    Packs/day: 0.10    Years: 27.00    Pack years: 2.70    Types: Cigars  . Smokeless tobacco: Never Used  . Tobacco comment: couple cigars per day  Vaping Use  . Vaping Use: Never used  Substance Use Topics  . Alcohol use: Yes    Alcohol/week: 0.0 standard drinks    Comment: less than 40 oz a week  . Drug use: Not Currently    Comment: Past history of crack cocaine  use     Home Medications Prior to Admission medications   Medication Sig Start Date End Date Taking? Authorizing Provider    albuterol (VENTOLIN HFA) 108 (90 Base) MCG/ACT inhaler Inhale 1-2 puffs into the lungs every 6 (six) hours as needed for wheezing or shortness of breath. 10/25/18  Yes Lauraine Rinne, NP  bictegravir-emtricitabine-tenofovir AF (BIKTARVY) 50-200-25 MG TABS tablet Take 1 tablet by mouth daily. 09/16/19  Yes Michel Bickers, MD  hydrochlorothiazide (HYDRODIURIL) 25 MG tablet TAKE 1 TABLET(25 MG) BY MOUTH DAILY Patient taking differently: Take 25 mg by mouth daily.  04/26/19  Yes Jeanmarie Hubert, MD  HYDROcodone-acetaminophen (NORCO/VICODIN) 5-325 MG tablet Take 1 tablet by mouth every 6 (six) hours as needed for moderate pain. 10/02/19   Fredia Sorrow, MD  lidocaine (XYLOCAINE) 2 % jelly Apply 1 application topically as needed. Apply to the affected area as needed Patient not taking: Reported on 10/01/2019 08/02/19   Maudie Mercury, MD  nicotine (NICODERM CQ - DOSED IN MG/24 HOURS) 14 mg/24hr patch Place 1 patch (14 mg total) onto the skin daily. Patient not taking: Reported on 05/06/2019 04/05/19   Marshell Garfinkel, MD  varenicline (CHANTIX PAK) 0.5 MG X 11 & 1 MG X 42 tablet Take one 0.5 mg tablet by mouth once daily for 3 days, then increase to one 0.5 mg tablet twice daily for 4 days, then increase to one 1 mg tablet twice daily. Patient not taking: Reported on 05/06/2019 04/05/19   Marshell Garfinkel, MD    Allergies    Nsaids and Sulfa antibiotics  Review of Systems   Review of Systems  Constitutional: Negative for chills and fever.  HENT: Negative for congestion, rhinorrhea and sore throat.   Eyes: Negative for visual disturbance.  Respiratory: Positive for cough and shortness of breath.   Cardiovascular: Positive for chest pain. Negative for leg swelling.  Gastrointestinal: Negative for abdominal pain, diarrhea, nausea and vomiting.  Genitourinary: Negative for dysuria.  Musculoskeletal: Negative for back pain and neck pain.  Skin: Negative for rash.  Neurological: Negative for dizziness,  light-headedness and headaches.  Hematological: Does not bruise/bleed easily.  Psychiatric/Behavioral: Negative for confusion.    Physical Exam Updated Vital Signs BP 106/79   Pulse 65   Temp 98.2 F (36.8 C)   Resp 14   SpO2 99%   Physical Exam Vitals and nursing note reviewed.  Constitutional:      Appearance: Normal appearance. He is well-developed.  HENT:     Head: Normocephalic and atraumatic.  Eyes:     Extraocular Movements: Extraocular movements intact.     Conjunctiva/sclera: Conjunctivae normal.     Pupils: Pupils are equal, round, and reactive to light.  Cardiovascular:     Rate and Rhythm: Normal rate and regular rhythm.     Heart sounds: No murmur heard.   Pulmonary:     Effort: Pulmonary effort is normal. No respiratory distress.  Breath sounds: Normal breath sounds. No wheezing.  Abdominal:     Palpations: Abdomen is soft.     Tenderness: There is no abdominal tenderness.  Musculoskeletal:     Cervical back: Normal range of motion and neck supple.  Skin:    General: Skin is warm and dry.     Capillary Refill: Capillary refill takes less than 2 seconds.  Neurological:     General: No focal deficit present.     Mental Status: He is alert and oriented to person, place, and time.     ED Results / Procedures / Treatments   Labs (all labs ordered are listed, but only abnormal results are displayed) Labs Reviewed - No data to display   Results for orders placed or performed during the hospital encounter of 10/01/19  CBC with Differential  Result Value Ref Range   WBC 3.9 (L) 4.0 - 10.5 K/uL   RBC 3.94 (L) 4.22 - 5.81 MIL/uL   Hemoglobin 14.4 13.0 - 17.0 g/dL   HCT 40.9 39 - 52 %   MCV 103.8 (H) 80.0 - 100.0 fL   MCH 36.5 (H) 26.0 - 34.0 pg   MCHC 35.2 30.0 - 36.0 g/dL   RDW 11.4 (L) 11.5 - 15.5 %   Platelets 169 150 - 400 K/uL   nRBC 0.0 0.0 - 0.2 %   Neutrophils Relative % 57 %   Neutro Abs 2.3 1.7 - 7.7 K/uL   Lymphocytes Relative 30 %    Lymphs Abs 1.2 0.7 - 4.0 K/uL   Monocytes Relative 10 %   Monocytes Absolute 0.4 0 - 1 K/uL   Eosinophils Relative 2 %   Eosinophils Absolute 0.1 0 - 0 K/uL   Basophils Relative 0 %   Basophils Absolute 0.0 0 - 0 K/uL   Immature Granulocytes 1 %   Abs Immature Granulocytes 0.02 0.00 - 0.07 K/uL  Basic metabolic panel  Result Value Ref Range   Sodium 136 135 - 145 mmol/L   Potassium 3.4 (L) 3.5 - 5.1 mmol/L   Chloride 103 98 - 111 mmol/L   CO2 23 22 - 32 mmol/L   Glucose, Bld 113 (H) 70 - 99 mg/dL   BUN 9 6 - 20 mg/dL   Creatinine, Ser 0.96 0.61 - 1.24 mg/dL   Calcium 8.7 (L) 8.9 - 10.3 mg/dL   GFR calc non Af Amer >60 >60 mL/min   GFR calc Af Amer >60 >60 mL/min   Anion gap 10 5 - 15    EKG None   ED ECG REPORT   Date: 10/02/2019  Rate: 60  Rhythm: normal sinus rhythm  QRS Axis: normal  Intervals: normal  ST/T Wave abnormalities: early repolarization  Conduction Disutrbances:none  Narrative Interpretation:   Old EKG Reviewed: unchanged  I have personally reviewed the EKG tracing and agree with the computerized printout as noted.   Radiology DG Chest 2 View  Result Date: 10/01/2019 CLINICAL DATA:  Cough.  Right-sided pain with inspiration. EXAM: CHEST - 2 VIEW COMPARISON:  Most recent CT 03/24/2019 FINDINGS: Chain sutures at the right lung apex and right suprahilar lung. Chronic blunting of the right costophrenic angle with scarring in the periphery of the midlung zone. No acute airspace disease. Normal heart size and mediastinal contours. No pulmonary edema. No pneumothorax. No acute osseous abnormalities are seen. IMPRESSION: Postsurgical change in the right hemithorax without evidence of acute abnormality. Chronic scarring at the right lung base and midlung zone. Electronically Signed   By:  Keith Rake M.D.   On: 10/01/2019 20:19    Procedures Procedures (including critical care time)  Medications Ordered in ED Medications - No data to display  ED Course    I have reviewed the triage vital signs and the nursing notes.  Pertinent labs & imaging results that were available during my care of the patient were reviewed by me and considered in my medical decision making (see chart for details).    MDM Rules/Calculators/A&P                         Patient's not febrile.  No hypoxia.  Not tachycardic.  EKG is heart rate is 60 with sinus rhythm has some early repole pattern.  No significant changes from prior EKGs.  Chest x-ray here without any acute findings.  Patient's had labs done earlier today at [facility].  No significant changes in the labs.  We will treat patient for the chest discomfort with a short course of hydrocodone have him follow-up with his doctors.  Significant the chest x-ray shows no evidence of pneumonia.  And patient without leukocytosis.   Final Clinical Impression(s) / ED Diagnoses Final diagnoses:  Chest pain, pleuritic    Rx / DC Orders ED Discharge Orders         Ordered    HYDROcodone-acetaminophen (NORCO/VICODIN) 5-325 MG tablet  Every 6 hours PRN     Discontinue  Reprint     10/02/19 0002           Fredia Sorrow, MD 10/02/19 0008

## 2019-10-02 MED ORDER — HYDROCODONE-ACETAMINOPHEN 5-325 MG PO TABS: 1.0000 | ORAL_TABLET | Freq: Four times a day (QID) | ORAL | 0 refills | Status: DC | PRN

## 2019-10-02 NOTE — Discharge Instructions (Addendum)
Follow-up with your doctors.  Return for any new or worse symptoms.  Today's work-up without any acute findings.  Take the hydrocodone as needed for pain.

## 2019-10-12 ENCOUNTER — Other Ambulatory Visit: Payer: Medicaid Other

## 2019-10-18 ENCOUNTER — Ambulatory Visit
Admission: RE | Admit: 2019-10-18 | Discharge: 2019-10-18 | Disposition: A | Discharge status: Home / Self Care | Payer: Medicaid Other | Source: Ambulatory Visit | Attending: Pulmonary Disease | Admitting: Pulmonary Disease

## 2019-10-18 DIAGNOSIS — R911 Solitary pulmonary nodule: Secondary | ICD-10-CM

## 2019-10-26 ENCOUNTER — Telehealth: Payer: Self-pay | Admitting: Pulmonary Disease

## 2019-10-26 NOTE — Telephone Encounter (Signed)
Pt called back, please return call  

## 2019-10-26 NOTE — Telephone Encounter (Signed)
Advised pt of results. Pt understood and nothing further is needed.   

## 2019-10-26 NOTE — Telephone Encounter (Signed)
ATC pt, no answer. Left message for pt to call back.   Marshell Garfinkel, MD  10/21/2019 8:30 AM EDT     Let patient know that CT scan shows lung nodule slightly smaller which indicate this likely benign  He will need enrollment in low-dose screening CT of the chest when he turns 49 years old in July 2022. Please make a referral

## 2019-10-27 ENCOUNTER — Other Ambulatory Visit: Payer: Self-pay | Admitting: Internal Medicine

## 2019-10-27 DIAGNOSIS — I10 Essential (primary) hypertension: Secondary | ICD-10-CM

## 2019-11-13 IMAGING — CT CT IMAGE GUIDED FLUID DRAIN BY CATHETER
1 of 3 series · 13 of 32 positions shown, 19 images · non-contrast
Comparison: none

INDICATION: 47-year-old male with a persistent right apical hydropneumothorax
following wedge resection of the right upper lobe. He presents for
placement of an image guided chest tube.

[Series 2: i-spiral 5.0 b40f · axial · 0.75mm/px · z∈[+1394,+1506]mm · 13 of 38 slices shown, 19 images]
[im 3/38  soft-tissue]
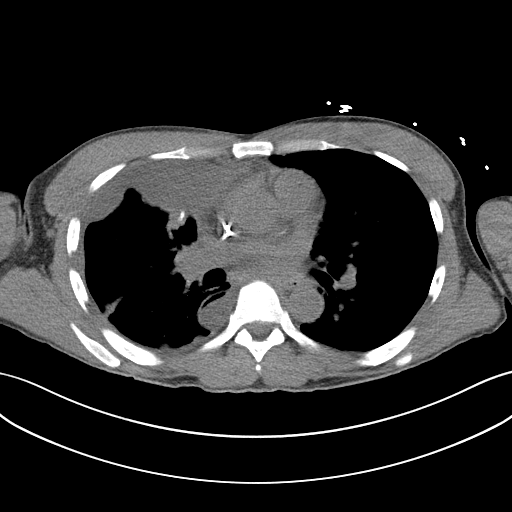
[im 3/38  bone]
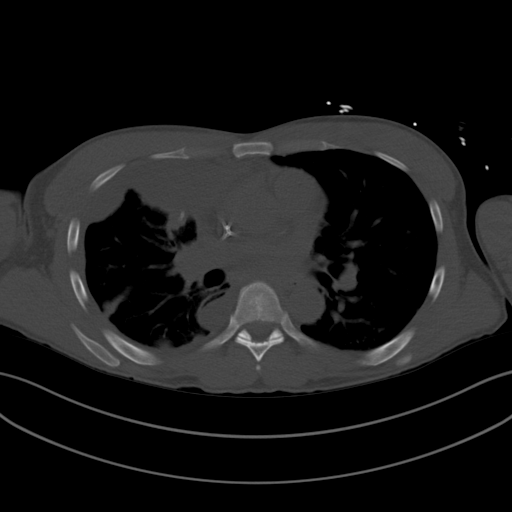
[im 6/38  soft-tissue]
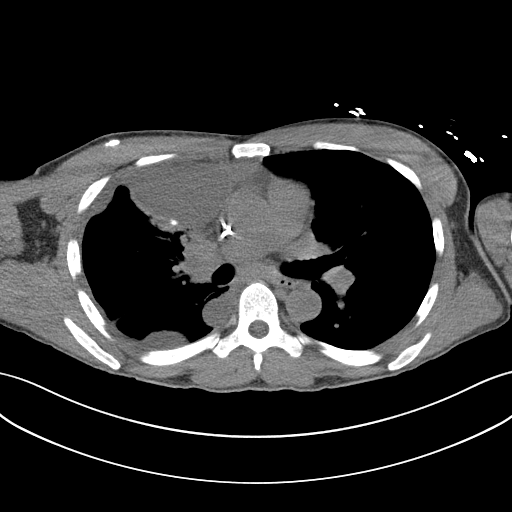
[im 8/38  soft-tissue]
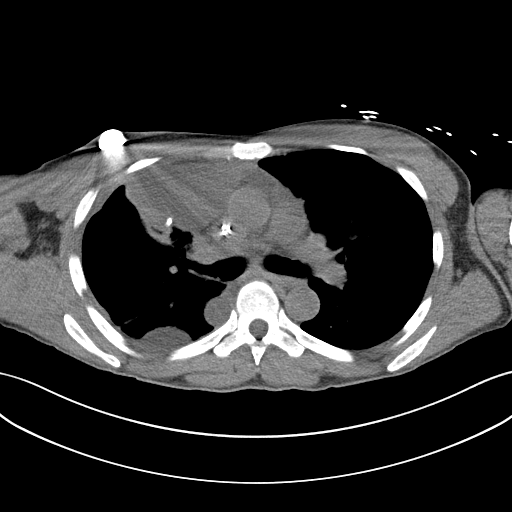
[im 11/38  soft-tissue]
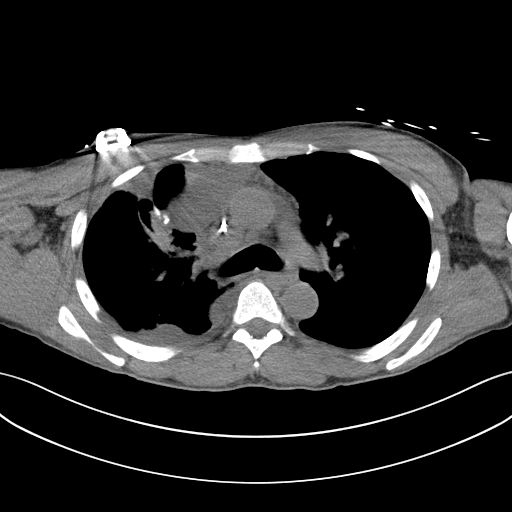
[im 14/38  soft-tissue]
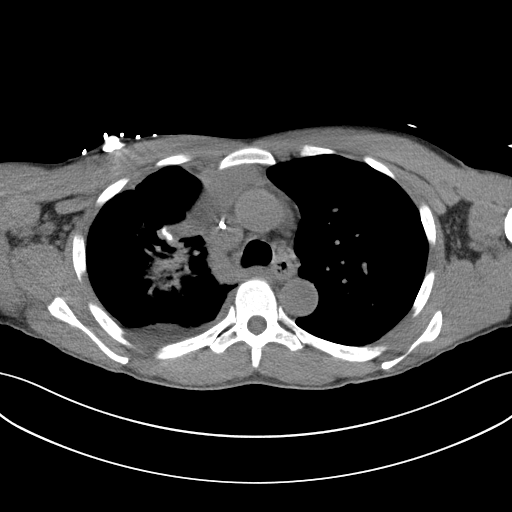
[im 16/38  soft-tissue]
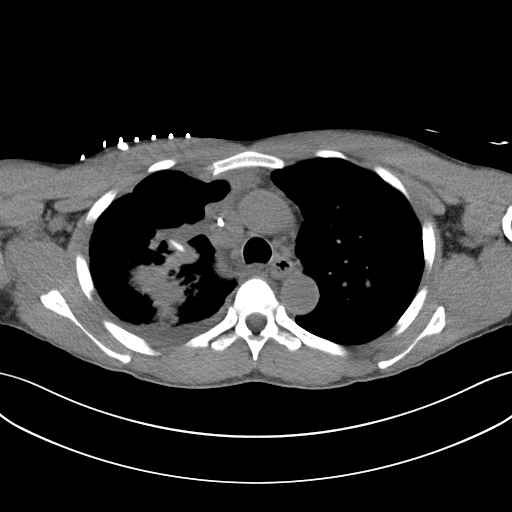
[im 19/38  soft-tissue]
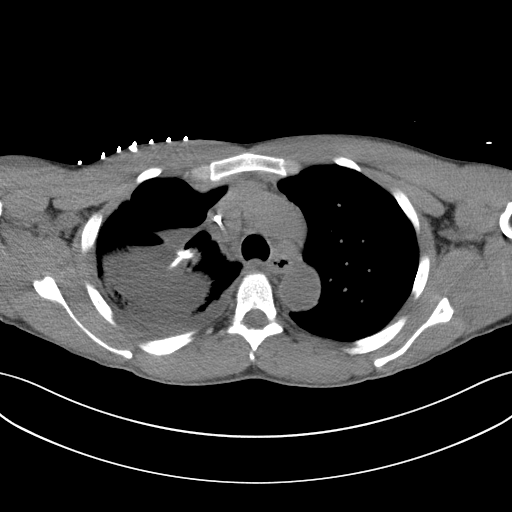
[im 22/38  soft-tissue]
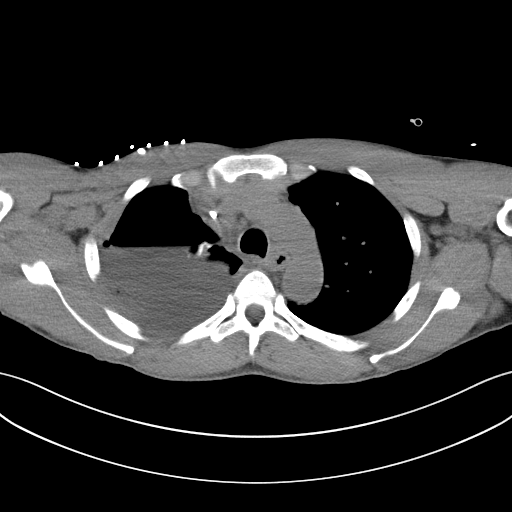
[im 24/38  soft-tissue]
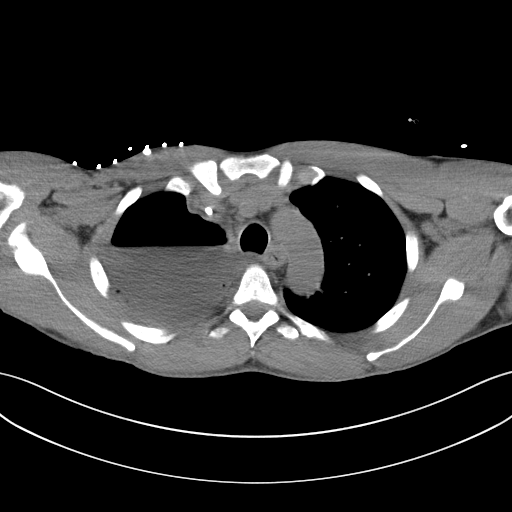
[im 24/38  bone]
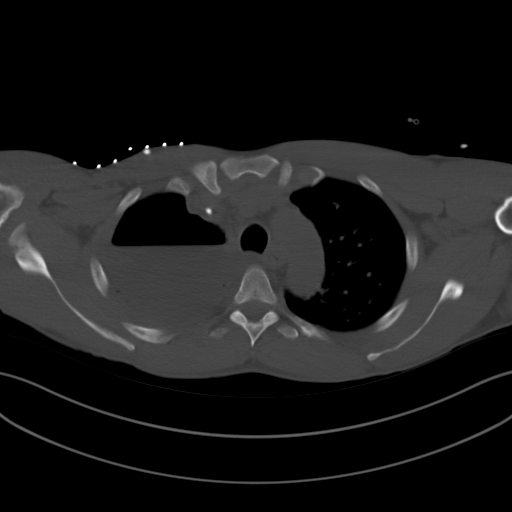
[im 27/38  soft-tissue]
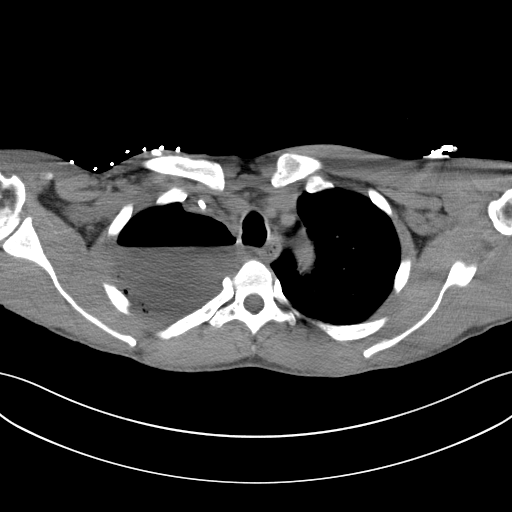
[im 27/38  lung]
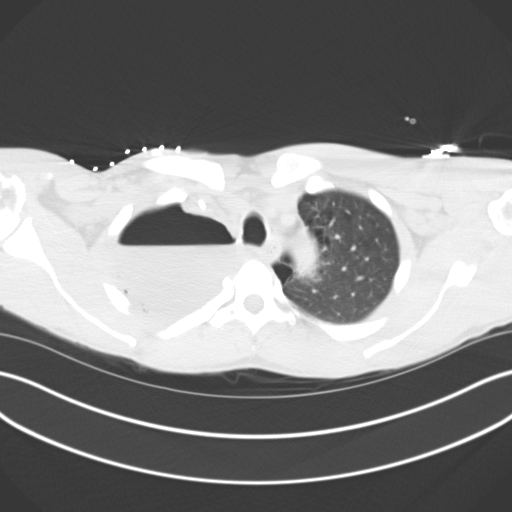
[im 30/38  soft-tissue]
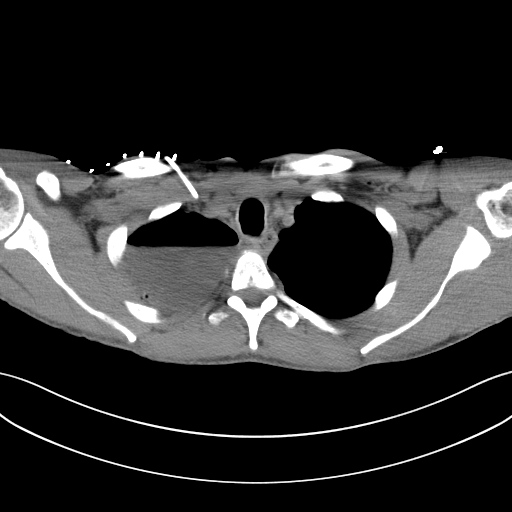
[im 30/38  lung]
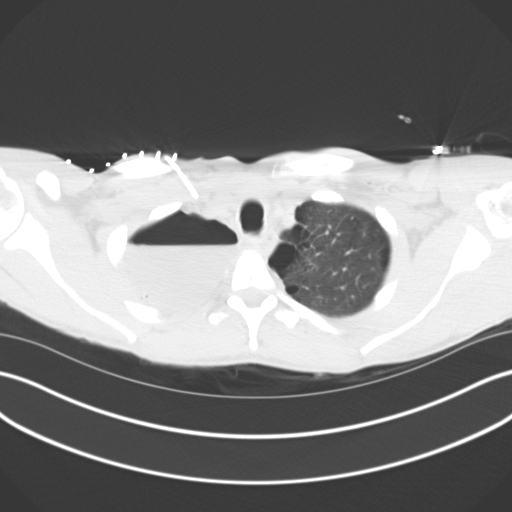
[im 32/38  soft-tissue]
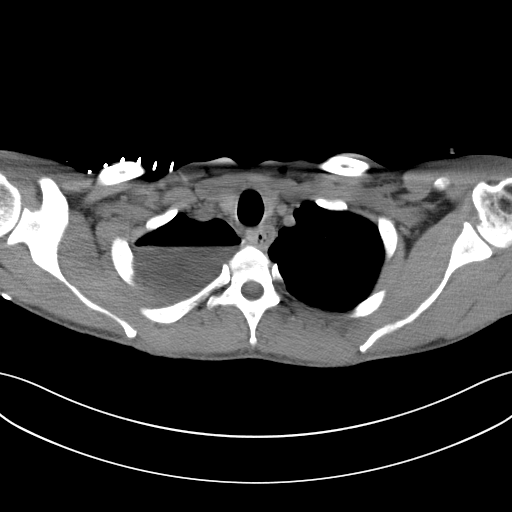
[im 32/38  lung]
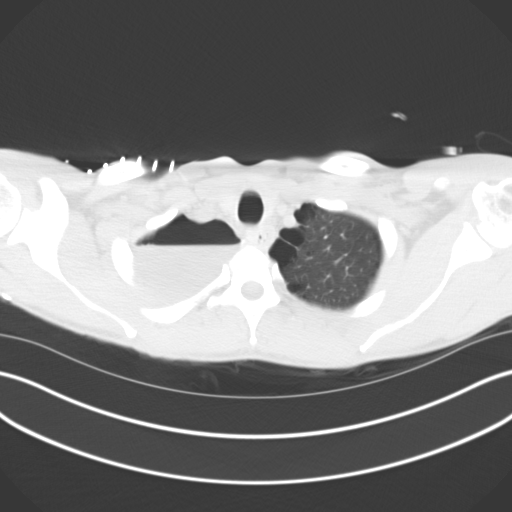
[im 35/38  soft-tissue]
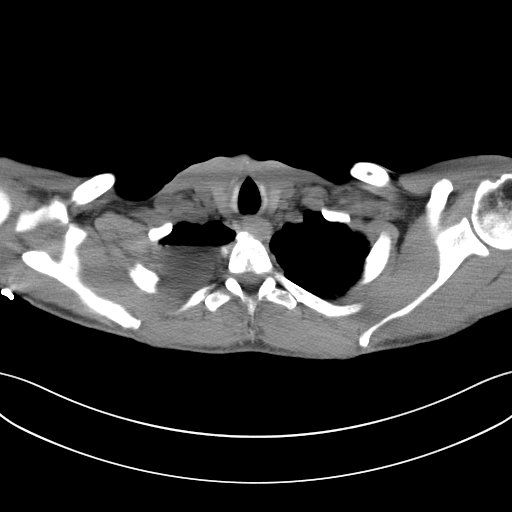
[im 35/38  lung]
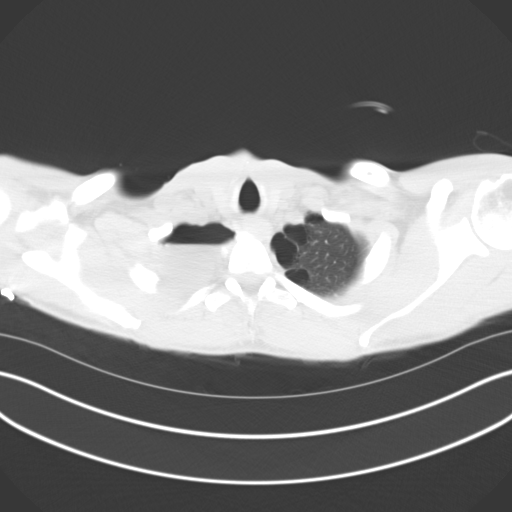

[13 of 32 positions shown; findings below may reference images not displayed]

EXAM:
Chest tube placement under CT guidance

MEDICATIONS:
None

ANESTHESIA/SEDATION:
Fentanyl 100 mcg IV; Versed 2 mg IV

Moderate Sedation Time:  20 minutes

The patient was continuously monitored during the procedure by the
interventional radiology nurse under my direct supervision.

COMPLICATIONS:
None immediate.

PROCEDURE:
Informed written consent was obtained from the patient after a
thorough discussion of the procedural risks, benefits and
alternatives. All questions were addressed. A timeout was performed
prior to the initiation of the procedure.

Axial CT imaging was performed. A suitable skin entry site was
selected between the anterior aspects of the first and second ribs.
The overlying skin was sterilely prepped and draped in the standard
fashion using chlorhexidine skin prep. Local anesthesia was attained
by infiltration with 1% lidocaine. Using trocar technique, a 10
[place] drainage catheter was advanced between the
ribs and into the pleural space. The catheter was advanced in the
lung apex and formed. The catheter was connected to low wall suction
via an atrium at -20 cm of water. The catheter was then secured to
the skin with 0 Prolene suture and an adhesive fixation device. Post
drain placement imaging demonstrates a well-positioned drainage
catheter in the right apex.
IMPRESSION: Successful placement of a right apical 10 French pigtail drainage
catheter. The catheter should be maintained to low wall suction via
an atrium until the hydropneumothorax and any underlying air leak
have resolved.

## 2019-11-14 ENCOUNTER — Encounter: Payer: Self-pay | Admitting: Internal Medicine

## 2019-11-14 ENCOUNTER — Ambulatory Visit (INDEPENDENT_AMBULATORY_CARE_PROVIDER_SITE_OTHER): Payer: Medicaid Other | Admitting: Internal Medicine

## 2019-11-14 VITALS — BP 108/72 | HR 64 | Temp 98.3°F | Ht 71.0 in | Wt 156.3 lb

## 2019-11-14 DIAGNOSIS — T63301A Toxic effect of unspecified spider venom, accidental (unintentional), initial encounter: Secondary | ICD-10-CM | POA: Diagnosis not present

## 2019-11-14 NOTE — Assessment & Plan Note (Signed)
Jeremiah [name] is a 49 yo M w/ PMH of controlled HIV on Biktarvy presenting with ulcers after bug bite. He mentions he was in his usual state of health until couple days ago when he experience acute onset sharp pain on his leg. He thought he may have seen a spider when he shook his pants leg but is unable to provide detailed description. He mentions having some pruritus and swelling over the next couple days and wanted to be evaluated by a doctor after reading about complications associated with brown recluse spider bites.  A/P Present w/ bug bite possibly from a spider. On exam, 2 well-healing ulcers without evidence of necrotizing fasciitis. Jeremiah [name] is up-to-date on tetanus. No indication for anti-venom or antibiotics. Advised to keep wound site clean and monitor for now. Additional information regarding red-flag symptoms provided. Also chart review shows recent labs with mild leukopenia. Will repeat cbc to ensure resolution.  - Monitor for now - Cbc

## 2019-11-14 NOTE — Progress Notes (Signed)
   CC: Bug bite  HPI: [name] is a 49 y.o. with PMH listed below presenting with complaint of bug bite. Please see problem based assessment and plan for further details.  Past Medical History:  Diagnosis Date  . Anal cancer ([place])   . Anal warts   . Carpal tunnel syndrome    Right  . COPD (chronic obstructive pulmonary disease) ([place])   . Depression   . Hemorrhoids 02/25/2015  . History of tuberculin skin testing within last year 2014  . HIV (human immunodeficiency virus infection) ([place])   . Hypertension   . Latent syphilis   . Lung nodule    Right upper lobe  . Pneumonia 2007   x2  . Pneumothorax 07/01/2018   LEFT  . Rectal cancer ([place])    Review of Systems: Review of Systems  Constitutional: Negative for chills, fever and malaise/fatigue.  Eyes: Negative for blurred vision.  Respiratory: Negative for shortness of breath.   Cardiovascular: Negative for chest pain and palpitations.  Gastrointestinal: Positive for nausea. Negative for constipation, diarrhea and vomiting.  Neurological: Negative for dizziness.  All other systems reviewed and are negative.    Physical Exam: Vitals:   11/14/19 1001  BP: 108/72  Pulse: 64  Temp: 98.3 F (36.8 C)  TempSrc: Oral  SpO2: 99%  Weight: 156 lb 4.8 oz (70.9 kg)  Height: 5\' 11"  (1.803 m)   Gen: Well-developed, well nourished, NAD HEENT: NCAT head, hearing intact, EOMI, MMM Pulm: Breathing comfortably on room air, no cough, no distress  Extm: ROM intact, No peripheral edema Skin: Skin: 2 raised ulcers <1cm in diameter noted on R upper thigh with larger lesion pictured below. No surrounding erythema, warmth or tenderness.  Neuro: AAOx3, Answers questions appropriately Psych: Normal mood and affect       Assessment & Plan:   Spider bite [name] is a 49 yo M w/ PMH of controlled HIV on Biktarvy presenting with ulcers after bug bite. He mentions he was in his usual state of health until couple days ago when  he experience acute onset sharp pain on his leg. He thought he may have seen a spider when he shook his pants leg but is unable to provide detailed description. He mentions having some pruritus and swelling over the next couple days and wanted to be evaluated by a doctor after reading about complications associated with brown recluse spider bites.  A/P Present w/ bug bite possibly from a spider. On exam, 2 well-healing ulcers without evidence of necrotizing fasciitis. [name] is up-to-date on tetanus. No indication for anti-venom or antibiotics. Advised to keep wound site clean and monitor for now. Additional information regarding red-flag symptoms provided. Also chart review shows recent labs with mild leukopenia. Will repeat cbc to ensure resolution.  - Monitor for now - Cbc    Patient discussed with Dr. Heber [place]  -Gilberto Better, [facility] Pager: [phone]

## 2019-11-14 NOTE — Patient Instructions (Addendum)
Thank you for allowing [place] to provide your care today. Today we discussed your bug bite    I have ordered cbc labs for you. I will call if any are abnormal.    Today we made no changes to your medications.    Please follow-up as needed.    Should you have any questions or concerns please call the internal medicine clinic at [phone].     Owens Shark Recluse Spider Bite  [employer] recluse spiders (Loxosceles species) are found mainly in the [place] as far [place] as [place]. In the [place] bites usually occur in warmer months (April through October). When a brown recluse spider bites, it can inject poison (venom) into the wound. In most cases, a bite from a brown recluse spider causes mild redness and swelling around the bite. In rare cases, a bite can be serious and even life threatening. If you think you were bitten by a brown recluse spider, get medical help right away. Brown recluse spiders can be dark brown to light tan in color. On their back, they have a band of darker color that is shaped like a violin. They may be found outdoors underneath items that are lying on the ground. They may also live indoors in places that are out of the way, such as attics. What are the causes? A spider bite is often caused by a person accidentally making contact with a spider in a way that traps the spider against the skin. What are the signs or symptoms? Symptoms of this condition may include:  Pain and redness around the bite. At first, the bite may be a small, painful blister with redness around it. The blister may break open and create a sore (ulcer) that can get worse and spread over time. A severe ulcer may cause up to 12 inches (30 cm) of tissue around the bite to die.  A general feeling of sickness (malaise).  Nausea or vomiting.  Fever.  Body aches. Symptoms may start to get worse several days after you are bitten. In rare cases, the bite can cause more serious symptoms. This is  most likely to happen in children, or if you are bitten by a [place] species of the spider. Serious symptoms usually develop during the first 48-72 hours after the bite. They may include:  Yellowing of the skin (jaundice).  Breakdown of red blood cells (hemolytic anemia).  Dark or bloody urine or decreased urination. These could be signs of kidney failure. How is this diagnosed? This condition may be diagnosed based on:  Your symptoms.  A physical exam.  Any details you have about how the bite happened and what the spider looked like. How is this treated? There is no single cure (antidote) to treat this bite. A bite wound may take 1-2 months to heal. Treatment is focused on wound care, and may involve:  Covering the wound with a bandage (dressing).  Medicines to treat the ulcer and help prevent tissue death.  Antibiotic medicine. This may be taken by mouth or applied to the bite area.  Tetanus shot.  Surgery to remove damaged tissue. This may be needed if a large ulcer develops. Treatment for more serious cases may include:  Receiving donated red blood cells (transfusion), for hemolytic anemia.  IV fluids for kidney failure.  Dialysis, for severe kidney failure. Dialysis is a treatment that filters the blood when the kidneys are unable to. Follow these instructions at home: Medicines  Take or apply  over-the-counter and prescription medicines only as told by your health care provider.  If you were prescribed an antibiotic medicine, take or apply it as told by your health care provider. Do not stop using the antibiotic even if your condition improves. Wound care   Follow instructions from your health care provider about how to take care of your bite wound. Make sure you: ? Wash your hands with soap and water before you change your dressing. If soap and water are not available, use hand sanitizer. ? Change your dressing as told by your health care provider. ? Keep  the bite area clean and dry. ? Wash the bite area daily with soap and water, as told by your health care provider.  Do not scratch the bite area.  Check the bite area every day for signs of infection. Check for: ? More redness, swelling, or pain. ? Fluid or blood. ? Warmth. ? Pus or a bad smell. General instructions   If directed, put ice on the bite area. ? Put ice in a plastic bag. ? Place a towel between your skin and the bag. ? Leave the ice on for 20 minutes, 2-3 times a day.  Raise (elevate) the bite area above the level of your heart while you are sitting or lying down, if this is possible.  Keep all follow-up visits as told by your health care provider. This is important. Contact a health care provider if you:  Notice that your symptoms get worse or do not improve after 24 hours.  Have more redness, swelling, or pain around your wound.  Notice that your wound feels warm to the touch.  Notice pus or a bad smell coming from your wound. Get help right away if you:  Notice that your wound seems to be getting larger or growing deeper.  Have fluid or blood coming from your wound.  Have chills or a fever.  Feel nauseous or you vomit.  Have muscle aches.  Feel unusually weak or tired.  Have involuntary muscle movements (convulsions).  Develop a rash.  Urinate less often than usual.  Have blood in your urine or other unusual bleeding.  Notice that your skin turns yellow. Summary  Owens Shark recluse spider bites usually occur in warmer months (April through October).  Brown recluse spiders can be dark brown to light tan in color and have a dark brown violin shape on their back.  In most cases, a bite from a brown recluse spider causes mild redness and swelling around the bite. In rare cases, more serious issues can occur like yellowing of the skin, anemia, or kidney failure.  There is no single cure (antidote) to treat this bite. Treatment is focused on wound  care.  A bite wound may take 1-2 months to heal. This information is not intended to replace advice given to you by your health care provider. Make sure you discuss any questions you have with your health care provider. Document Revised: 04/10/2017 Document Reviewed: 02/05/2017 Elsevier Patient Education  2020 [employer].

## 2019-11-15 LAB — CBC WITH DIFFERENTIAL/PLATELET
Basophils Absolute: 0 10*3/uL (ref 0.0–0.2)
Basos: 1 %
EOS (ABSOLUTE): 0.1 10*3/uL (ref 0.0–0.4)
Eos: 4 %
Hematocrit: 39.9 % (ref 37.5–51.0)
Hemoglobin: 14.7 g/dL (ref 13.0–17.7)
Immature Grans (Abs): 0 10*3/uL (ref 0.0–0.1)
Immature Granulocytes: 1 %
Lymphocytes Absolute: 1.1 10*3/uL (ref 0.7–3.1)
Lymphs: 31 %
MCH: 38.4 pg — ABNORMAL HIGH (ref 26.6–33.0)
MCHC: 36.8 g/dL — ABNORMAL HIGH (ref 31.5–35.7)
MCV: 104 fL — ABNORMAL HIGH (ref 79–97)
Monocytes Absolute: 0.4 10*3/uL (ref 0.1–0.9)
Monocytes: 12 %
Neutrophils Absolute: 1.9 10*3/uL (ref 1.4–7.0)
Neutrophils: 51 %
Platelets: 153 10*3/uL (ref 150–450)
RBC: 3.83 x10E6/uL — ABNORMAL LOW (ref 4.14–5.80)
RDW: 12.8 % (ref 11.6–15.4)
WBC: 3.7 10*3/uL (ref 3.4–10.8)

## 2019-11-15 NOTE — Progress Notes (Signed)
Internal Medicine Clinic Attending  Case discussed with Dr. Lee  At the time of the visit.  We reviewed the resident's history and exam and pertinent patient test results.  I agree with the assessment, diagnosis, and plan of care documented in the resident's note.    

## 2019-11-16 IMAGING — DX PORTABLE CHEST - 1 VIEW
1 series · 1 of 1 positions shown · non-contrast
Comparison: 07/07/2018.

CLINICAL DATA: Chest tube.

EXAM:
PORTABLE CHEST 1 VIEW

[chest]
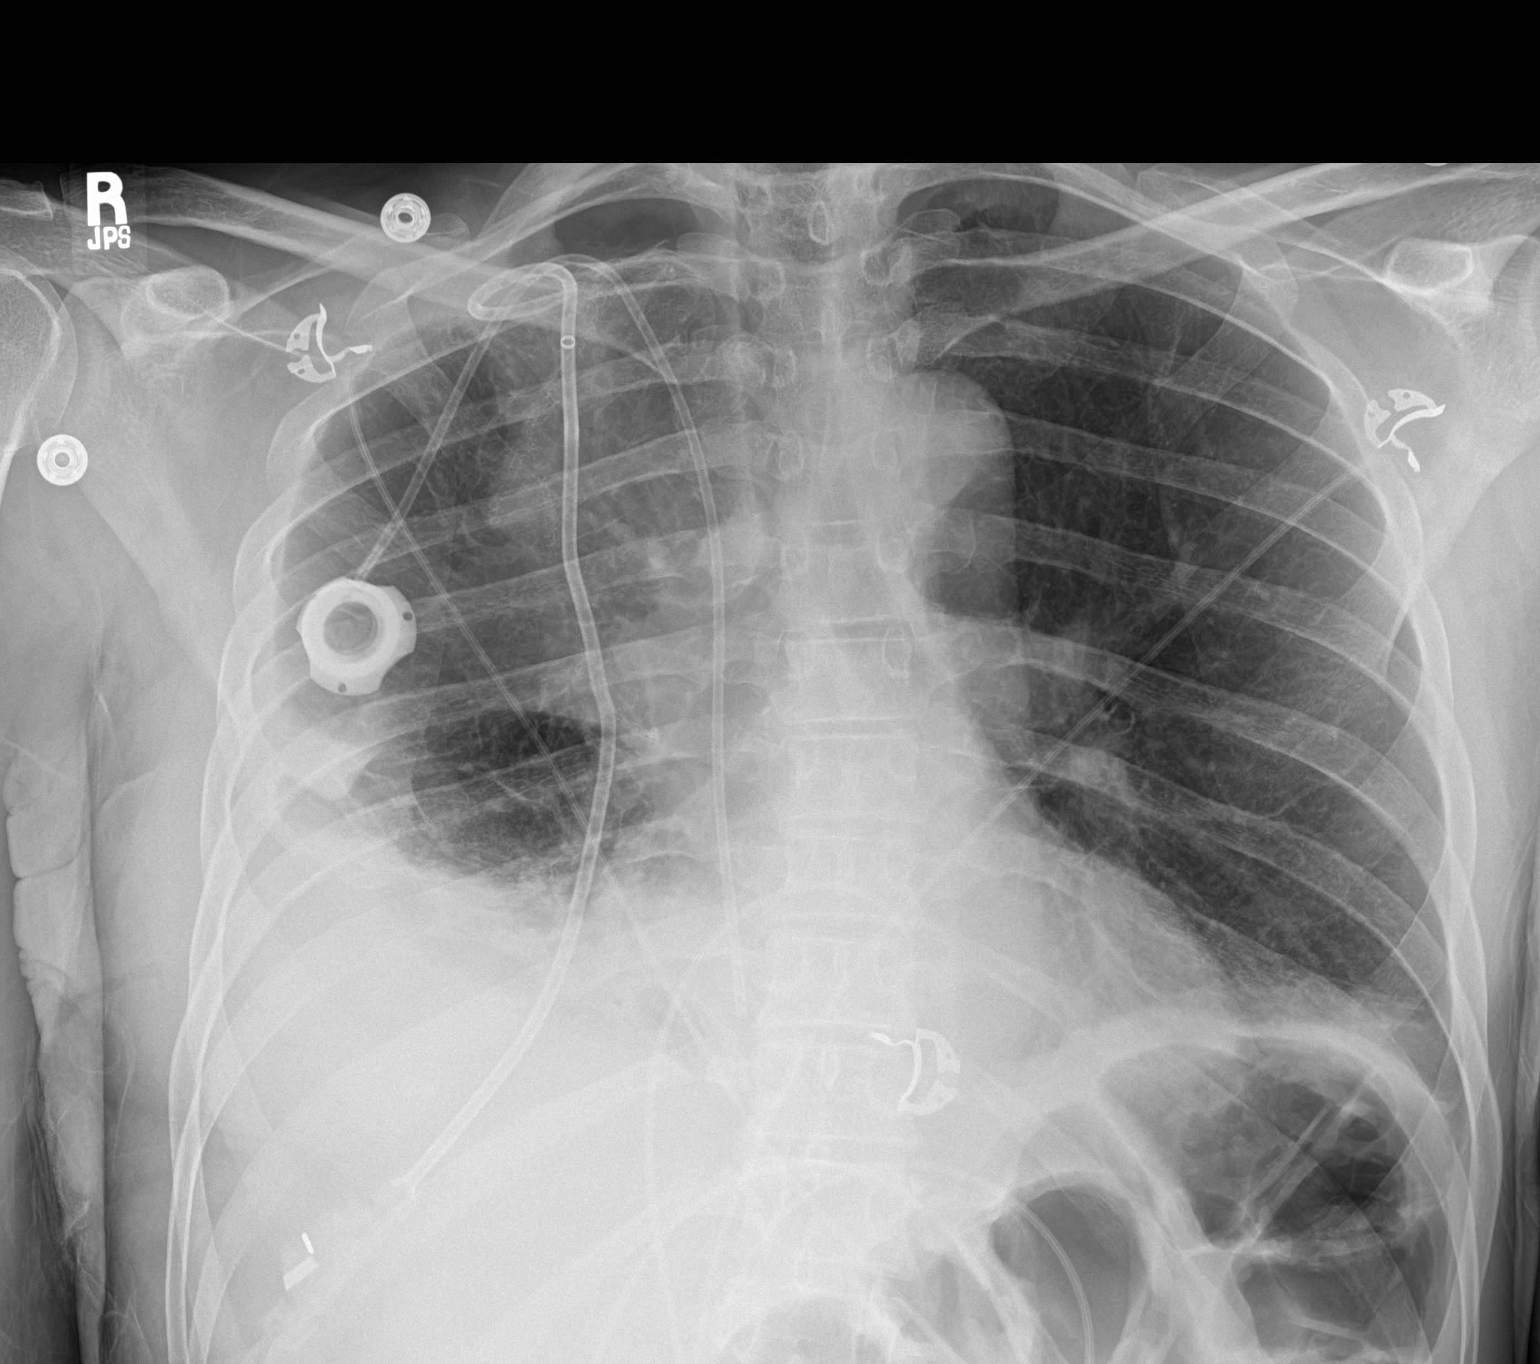

[1 of 1 positions shown; findings below may reference images not displayed]

FINDINGS: Port-A-Cath and right chest tube in stable position. Stable small
right apical pneumothorax. Postsurgical changes right lung again
noted. Atelectatic changes right base. Right-sided pleural
thickening/effusion again noted. No interim change. Mild left base
atelectasis, improved from prior exam. Stable heart size.
IMPRESSION: 1. Port-A-Cath and right chest tube in stable position. Stable small
right apical pneumothorax.

2. Postsurgical changes right lung. Atelectatic changes right lung
base. Right-sided pleural thickening/effusion again noted. Similar
findings noted on prior exam.

3.  Mild left base atelectasis, improved from prior exam.

## 2019-12-19 ENCOUNTER — Encounter: Payer: Self-pay | Admitting: Pulmonary Disease

## 2019-12-19 ENCOUNTER — Encounter: Payer: Self-pay | Admitting: Gastroenterology

## 2019-12-19 ENCOUNTER — Ambulatory Visit (INDEPENDENT_AMBULATORY_CARE_PROVIDER_SITE_OTHER): Payer: Medicaid Other | Admitting: Pulmonary Disease

## 2019-12-19 ENCOUNTER — Other Ambulatory Visit: Payer: Self-pay

## 2019-12-19 VITALS — BP 100/70 | HR 69 | Temp 97.3°F | Ht 71.0 in | Wt 154.6 lb

## 2019-12-19 DIAGNOSIS — R911 Solitary pulmonary nodule: Secondary | ICD-10-CM | POA: Diagnosis not present

## 2019-12-19 DIAGNOSIS — F17210 Nicotine dependence, cigarettes, uncomplicated: Secondary | ICD-10-CM | POA: Diagnosis not present

## 2019-12-19 NOTE — Progress Notes (Signed)
Jeremiah [name]    [id number]    1970-09-21  Primary Care Physician:[place], Charisse March, MD  Referring Physician: Lacinda Axon, MD [address] [name],  [place] [address]  Chief complaint: Follow-up for lung nodules, emphysema  HPI: 49 year old active smoker with HIV, anal cancer.  Referred for abnormal CT with right lung nodule  Diagnosed with anal cancer in November of 2018 and underwent chemotherapy, radiation at [facility] in [place].  Noted to have lung nodules on follow-up in [name] 2019 in [place] and surveillance recommended.  He has subsequently had CT scan and PET scan follow-up at [facility] this month which showed an FDG avid 1.7 cm nodule in the right upper lobe.   Underwent right upper lobe wedge resection of lung nodule in [name] 2020 which turned out to be granulomatous inflammation.  Postop developed hydropneumothorax requiring chest tube placement He subsequently developed right middle lobe spiculated opacity noted on CT scan May 2020 which is being followed by serial CTs.  The nodule has overall improved in size.  30-pack-year smoker and continues to smoke 1 pack/day.    Interim history: States that he is doing well with no issues.  He has occasional dyspnea Continues to smoke.  Reports blood in the stools intermittently.  Last episode was few weeks ago.  Outpatient Encounter Medications as of 12/19/2019  Medication Sig  . albuterol (VENTOLIN HFA) 108 (90 Base) MCG/ACT inhaler Inhale 1-2 puffs into the lungs every 6 (six) hours as needed for wheezing or shortness of breath.  . bictegravir-emtricitabine-tenofovir AF (BIKTARVY) 50-200-25 MG TABS tablet Take 1 tablet by mouth daily.  . hydrochlorothiazide (HYDRODIURIL) 25 MG tablet TAKE 1 TABLET(25 MG) BY MOUTH DAILY  . HYDROcodone-acetaminophen (NORCO/VICODIN) 5-325 MG tablet Take 1 tablet by mouth every 6 (six) hours as needed for moderate pain.  [place] Kitchen lidocaine (XYLOCAINE) 2 % jelly  Apply 1 application topically as needed. Apply to the affected area as needed  . nicotine (NICODERM CQ - DOSED IN MG/24 HOURS) 14 mg/24hr patch Place 1 patch (14 mg total) onto the skin daily.  . varenicline (CHANTIX PAK) 0.5 MG X 11 & 1 MG X 42 tablet Take one 0.5 mg tablet by mouth once daily for 3 days, then increase to one 0.5 mg tablet twice daily for 4 days, then increase to one 1 mg tablet twice daily.   No facility-administered encounter medications on file as of 12/19/2019.   Physical Exam: Blood pressure 100/70, pulse 69, temperature (!) 97.3 F (36.3 C), temperature source Temporal, height 5\' 11"  (1.803 m), weight 154 lb 9.6 oz (70.1 kg), SpO2 100 %. Gen:      No acute distress HEENT:  EOMI, sclera anicteric Neck:     No masses; no thyromegaly Lungs:    Clear to auscultation bilaterally; normal respiratory effort CV:         Regular rate and rhythm; no murmurs Abd:      + bowel sounds; soft, non-tender; no palpable masses, no distension Ext:    No edema; adequate peripheral perfusion Skin:      Warm and dry; no rash Neuro: alert and oriented x 3 Psych: normal mood and affect  Data Reviewed: Imaging: CT chest, abdomen pelvis 05/22/2018- 1.7 cm right upper lobe nodule, 2.1 cm right hilar lymph node.  No significant abnormality in the abdomen or pelvis.  PET scan 06/08/2018- FDG uptake in right upper lobe lung nodule, SUV 4.04.  No additional uptake  elsewhere in the body or in the mediastinum.  Mild nonspecific uptake in the fat at the level of anus.  CT chest 09/07/2018-new masslike opacity in the right middle lobe.  Emphysema, coronary atherosclerosis  CT chest 12/31/2018-decrease in size of right middle lobe.  Emphysema  CT chest 03/24/2019-new decrease in size of right middle lobe nodule.  Emphysema.  CT chest 10/18/2019-6 mm right middle lobe nodule nodule slightly decreased, postsurgical changes in right, emphysema I have reviewed the images personally.  PFTs: 06/10/2018-FVC  5.01 [113%], FEV1 3.83 [108%], F/F 76, TLC 6.65 [93%], DLCO 23.8 [77%] Minimal obstructive airway disease.  Minimal diffusion defect.  No restriction.  Labs: CBC 03/08/2019-WBC 4.6, eosinophils 2.6%, absolute eosinophil count 120 Alpha-1 antitrypsin 04/05/2019-90, PI MM  Pathology 06/23/2018 Necrotizing inflammation with fibrosis.  Lymph nodes negative for malignancy  Assessment:  Lung nodules He has resection of right upper lobe lung nodule with path showing necrotizing granulomatous inflammation on pathology with no evidence of malignancy Subsequently developed right middle lobe spiculated nodule that is improving on follow-up CT scans.  Suspect resolving scar.  Reviewed imaging with patient  Emphysema. Has significant emphysematous changes on CT scan.  PFTs do not show overt obstruction No need for inhalers at present  Active smoker Discussed smoking cessation.  He has tried nicotine patches and Chantix in the past but finds it too expensive Refer for low-dose screening CT of the chest to start and June 2022  Plan/Recommendations: - Start annual low-dose screening CTs in 2022 - Smoking cessation  Jeremiah Garfinkel MD  Pulmonary and Critical Care 12/19/2019, 9:39 AM  CC: Jeremiah Axon, MD

## 2019-12-19 NOTE — Addendum Note (Signed)
Addended by: Vanessa Barbara on: 12/19/2019 10:11 AM   Modules accepted: Orders

## 2019-12-19 NOTE — Patient Instructions (Addendum)
Your CT scan shows lung nodule is smaller which is good news Continue to work on smoking cessation We will refer you to GI for intermittent symptoms of blood in the stool  We will refer you for low-dose screening CT of the chest to start in June 2022  Follow-up in 1 year.

## 2020-01-09 ENCOUNTER — Encounter (HOSPITAL_COMMUNITY): Payer: Self-pay | Admitting: Emergency Medicine

## 2020-01-09 ENCOUNTER — Inpatient Hospital Stay (HOSPITAL_COMMUNITY)
Admission: EM | Admit: 2020-01-09 | Discharge: 2020-01-12 | DRG: 342 | Disposition: A | Discharge status: Home / Self Care | Payer: Medicaid Other | Attending: Student in an Organized Health Care Education/Training Program | Admitting: Student in an Organized Health Care Education/Training Program

## 2020-01-09 ENCOUNTER — Encounter: Payer: Self-pay | Admitting: Internal Medicine

## 2020-01-09 ENCOUNTER — Ambulatory Visit (INDEPENDENT_AMBULATORY_CARE_PROVIDER_SITE_OTHER): Payer: Medicaid Other | Admitting: Internal Medicine

## 2020-01-09 ENCOUNTER — Other Ambulatory Visit: Payer: Self-pay

## 2020-01-09 DIAGNOSIS — Z79899 Other long term (current) drug therapy: Secondary | ICD-10-CM

## 2020-01-09 DIAGNOSIS — Z8249 Family history of ischemic heart disease and other diseases of the circulatory system: Secondary | ICD-10-CM

## 2020-01-09 DIAGNOSIS — Z21 Asymptomatic human immunodeficiency virus [HIV] infection status: Secondary | ICD-10-CM | POA: Diagnosis present

## 2020-01-09 DIAGNOSIS — K5900 Constipation, unspecified: Secondary | ICD-10-CM | POA: Diagnosis present

## 2020-01-09 DIAGNOSIS — Z9049 Acquired absence of other specified parts of digestive tract: Secondary | ICD-10-CM | POA: Diagnosis present

## 2020-01-09 DIAGNOSIS — F17290 Nicotine dependence, other tobacco product, uncomplicated: Secondary | ICD-10-CM | POA: Diagnosis present

## 2020-01-09 DIAGNOSIS — Z20822 Contact with and (suspected) exposure to covid-19: Secondary | ICD-10-CM | POA: Diagnosis present

## 2020-01-09 DIAGNOSIS — K353 Acute appendicitis with localized peritonitis, without perforation or gangrene: Secondary | ICD-10-CM

## 2020-01-09 DIAGNOSIS — B353 Tinea pedis: Secondary | ICD-10-CM

## 2020-01-09 DIAGNOSIS — J449 Chronic obstructive pulmonary disease, unspecified: Secondary | ICD-10-CM | POA: Diagnosis present

## 2020-01-09 DIAGNOSIS — R109 Unspecified abdominal pain: Secondary | ICD-10-CM | POA: Diagnosis not present

## 2020-01-09 DIAGNOSIS — Z85048 Personal history of other malignant neoplasm of rectum, rectosigmoid junction, and anus: Secondary | ICD-10-CM

## 2020-01-09 DIAGNOSIS — D72819 Decreased white blood cell count, unspecified: Secondary | ICD-10-CM | POA: Diagnosis present

## 2020-01-09 DIAGNOSIS — K3580 Unspecified acute appendicitis: Principal | ICD-10-CM | POA: Diagnosis present

## 2020-01-09 DIAGNOSIS — K760 Fatty (change of) liver, not elsewhere classified: Secondary | ICD-10-CM | POA: Diagnosis present

## 2020-01-09 DIAGNOSIS — Z833 Family history of diabetes mellitus: Secondary | ICD-10-CM

## 2020-01-09 DIAGNOSIS — B20 Human immunodeficiency virus [HIV] disease: Secondary | ICD-10-CM | POA: Diagnosis present

## 2020-01-09 DIAGNOSIS — Z923 Personal history of irradiation: Secondary | ICD-10-CM

## 2020-01-09 DIAGNOSIS — I10 Essential (primary) hypertension: Secondary | ICD-10-CM | POA: Diagnosis present

## 2020-01-09 DIAGNOSIS — Z9221 Personal history of antineoplastic chemotherapy: Secondary | ICD-10-CM

## 2020-01-09 DIAGNOSIS — N179 Acute kidney failure, unspecified: Secondary | ICD-10-CM | POA: Diagnosis present

## 2020-01-09 DIAGNOSIS — Z882 Allergy status to sulfonamides status: Secondary | ICD-10-CM

## 2020-01-09 DIAGNOSIS — M79672 Pain in left foot: Secondary | ICD-10-CM | POA: Diagnosis not present

## 2020-01-09 DIAGNOSIS — K709 Alcoholic liver disease, unspecified: Secondary | ICD-10-CM | POA: Diagnosis present

## 2020-01-09 DIAGNOSIS — K37 Unspecified appendicitis: Secondary | ICD-10-CM

## 2020-01-09 DIAGNOSIS — E785 Hyperlipidemia, unspecified: Secondary | ICD-10-CM | POA: Diagnosis present

## 2020-01-09 DIAGNOSIS — Z8701 Personal history of pneumonia (recurrent): Secondary | ICD-10-CM

## 2020-01-09 DIAGNOSIS — Z886 Allergy status to analgesic agent status: Secondary | ICD-10-CM

## 2020-01-09 DIAGNOSIS — Z7289 Other problems related to lifestyle: Secondary | ICD-10-CM

## 2020-01-09 HISTORY — DX: Unspecified abdominal pain: R10.9

## 2020-01-09 NOTE — Assessment & Plan Note (Signed)
Patient presents to the clinic for L sided 5th digit foot pain, swelling, and skin opening. Patient states that this has been an ongoing issue. He was evaluated back in February of this year for similar symptoms. On physical examination the medial side of his R 5th digit is open, there is no discharge noted on examination, but he does have white, flaking skin between all 5 digits.  - Original plan was to order a foot xray to r/o osteomyelitis, and start antibiotic therapy. Due to his abdominal pain, he will be seen and evaluated in the ED.

## 2020-01-09 NOTE — ED Triage Notes (Signed)
Pt. Stated, I was sent from internal medicine for R/O appendicitis  And foot pain. Stomach pain in lower abdomen and left foot pain started 2 weeks ago. Its my pinky toe.

## 2020-01-09 NOTE — Patient Instructions (Addendum)
Jeremiah [name],  It was a pleasure to meet you today. Today we discussed your stomach pain and foot pain. Due to your physical exam findings, you will be seen in the Emergency Department for imaging  to rule out appendicitis. For your foot pain, we will get imaging of your foot to rule out a bone infection and start antibiotics.  Sincerely,  Maudie Mercury, MD

## 2020-01-09 NOTE — Assessment & Plan Note (Signed)
Patient presents to the office today after experiencing sudden right sided abdominal pain yesterday evening. The patient states that the pain is "sharp and stabbing," and located along the R side of his abdomen. He has used Tums with little relief. He states that nothing particularly aggravates his pain. He denies nausea, fevers, or diarrhea. He endorses constipation with straining the past several days,and change in stool caliber last week.  On physical examination, patient was tender to palpation over the R quadrants, with guarding present. His psoas sign was positive, with patient endorsing pain. His McBurney's, Rovsing's signs were negative. No hernias appreciated on physical examination.   Patient does have a Hx of HIV well controlled (last T cell count 300s, HIV not detectable) and rectal cancer. While he does not endorse nausea/vomiting, fevers, or diarrhea, his physical examination and the sudden onset of his symptoms are concerning for possible early appendicitis vs abscess vs cancer vs colitis. Given his symptoms, will have patient evaluated in the ED to r/o appendicitis or other urgent etiologies.  - Abdomen/pelvis CT  - CBC - Sent for further evaluation to the ED from clinic.

## 2020-01-09 NOTE — Progress Notes (Signed)
   CC: Right Sided Abdominal Pain, L foot pain   HPI:  Mr.Kolden Caspers is a 49 y.o. Male, with a PMH noted below, who presents to the clinic for right sided abdominal pain. To see the management of his acute and chronic conditions, please see the A&P note under the encounters tab.   Past Medical History:  Diagnosis Date  . Anal cancer ([place])   . Anal warts   . Carpal tunnel syndrome    Right  . COPD (chronic obstructive pulmonary disease) ([place])   . Depression   . Hemorrhoids 02/25/2015  . History of tuberculin skin testing within last year 2014  . HIV (human immunodeficiency virus infection) ([place])   . Hypertension   . Latent syphilis   . Lung nodule    Right upper lobe  . Pneumonia 2007   x2  . Pneumothorax 07/01/2018   LEFT  . Rectal cancer ([place])    Review of Systems:   Review of Systems  Constitutional: Positive for malaise/fatigue. Negative for chills, diaphoresis, fever and weight loss.  Eyes: Negative for blurred vision, double vision, photophobia and pain.  Cardiovascular: Negative for chest pain, palpitations, orthopnea, claudication and leg swelling.  Gastrointestinal: Positive for abdominal pain and constipation. Negative for blood in stool, diarrhea, melena, nausea and vomiting.  Genitourinary: Negative for dysuria, frequency, hematuria and urgency.  Neurological: Negative for dizziness and headaches.    Physical Exam:  Vitals:   01/09/20 1537  BP: 107/82  Pulse: 85  Temp: 98.5 F (36.9 C)  TempSrc: Oral  SpO2: 99%  Weight: 151 lb 8 oz (68.7 kg)  Height: 5\' 11"  (1.803 m)   Physical Exam Constitutional:      General: He is not in acute distress.    Appearance: He is not ill-appearing or toxic-appearing.  Cardiovascular:     Rate and Rhythm: Normal rate and regular rhythm.     Pulses: Normal pulses.     Heart sounds: No murmur heard.  No friction rub. No gallop.   Abdominal:     General: Abdomen is flat. Bowel sounds are normal.     Palpations:  There is no fluid wave, splenomegaly or pulsatile mass.     Tenderness: There is abdominal tenderness in the right upper quadrant and right lower quadrant. There is guarding. There is no right CVA tenderness or left CVA tenderness. Positive signs include psoas sign. Negative signs include Rovsing's sign and McBurney's sign.     Comments: No rashes or lesions present on observation. BS present, tenderness to percussion along the RUQ and RLL, guarding present in the RUQ/RLL, Mcburney's negative, Rovsing's negative, psoas positive.   Musculoskeletal:        General: No swelling or deformity.     Right lower leg: No edema.     Left lower leg: No edema.  Skin:    General: Skin is warm.  Neurological:     Mental Status: He is alert and oriented to person, place, and time.     Assessment & Plan:   See Encounters Tab for problem based charting.  Patient discussed with Dr. Dareen Piano

## 2020-01-10 ENCOUNTER — Emergency Department (HOSPITAL_COMMUNITY): Payer: Medicaid Other

## 2020-01-10 DIAGNOSIS — L089 Local infection of the skin and subcutaneous tissue, unspecified: Secondary | ICD-10-CM | POA: Diagnosis not present

## 2020-01-10 DIAGNOSIS — R1031 Right lower quadrant pain: Secondary | ICD-10-CM | POA: Diagnosis not present

## 2020-01-10 DIAGNOSIS — Z9049 Acquired absence of other specified parts of digestive tract: Secondary | ICD-10-CM | POA: Diagnosis present

## 2020-01-10 DIAGNOSIS — K760 Fatty (change of) liver, not elsewhere classified: Secondary | ICD-10-CM | POA: Diagnosis not present

## 2020-01-10 DIAGNOSIS — K3580 Unspecified acute appendicitis: Secondary | ICD-10-CM | POA: Diagnosis not present

## 2020-01-10 DIAGNOSIS — R109 Unspecified abdominal pain: Secondary | ICD-10-CM | POA: Diagnosis not present

## 2020-01-10 HISTORY — DX: Acquired absence of other specified parts of digestive tract: Z90.49

## 2020-01-10 LAB — URINALYSIS, ROUTINE W REFLEX MICROSCOPIC
Bacteria, UA: NONE SEEN
Bilirubin Urine: NEGATIVE
Glucose, UA: NEGATIVE mg/dL
Ketones, ur: NEGATIVE mg/dL
Leukocytes,Ua: NEGATIVE
Nitrite: NEGATIVE
Protein, ur: NEGATIVE mg/dL
Specific Gravity, Urine: 1.018 (ref 1.005–1.030)
pH: 5 (ref 5.0–8.0)

## 2020-01-10 LAB — CBC WITH DIFFERENTIAL/PLATELET
Abs Immature Granulocytes: 0.04 10*3/uL (ref 0.00–0.07)
Basophils Absolute: 0 10*3/uL (ref 0.0–0.1)
Basophils Relative: 1 %
Eosinophils Absolute: 0.3 10*3/uL (ref 0.0–0.5)
Eosinophils Relative: 5 %
HCT: 44.9 % (ref 39.0–52.0)
Hemoglobin: 16 g/dL (ref 13.0–17.0)
Immature Granulocytes: 1 %
Lymphocytes Relative: 26 %
Lymphs Abs: 1.4 10*3/uL (ref 0.7–4.0)
MCH: 37.5 pg — ABNORMAL HIGH (ref 26.0–34.0)
MCHC: 35.6 g/dL (ref 30.0–36.0)
MCV: 105.2 fL — ABNORMAL HIGH (ref 80.0–100.0)
Monocytes Absolute: 0.5 10*3/uL (ref 0.1–1.0)
Monocytes Relative: 9 %
Neutro Abs: 3.2 10*3/uL (ref 1.7–7.7)
Neutrophils Relative %: 58 %
Platelets: 176 10*3/uL (ref 150–400)
RBC: 4.27 MIL/uL (ref 4.22–5.81)
RDW: 11.6 % (ref 11.5–15.5)
WBC: 5.4 10*3/uL (ref 4.0–10.5)
nRBC: 0 % (ref 0.0–0.2)

## 2020-01-10 LAB — COMPREHENSIVE METABOLIC PANEL
ALT: 42 U/L (ref 0–44)
AST: 91 U/L — ABNORMAL HIGH (ref 15–41)
Albumin: 4.4 g/dL (ref 3.5–5.0)
Alkaline Phosphatase: 51 U/L (ref 38–126)
Anion gap: 8 (ref 5–15)
BUN: 12 mg/dL (ref 6–20)
CO2: 28 mmol/L (ref 22–32)
Calcium: 9.6 mg/dL (ref 8.9–10.3)
Chloride: 101 mmol/L (ref 98–111)
Creatinine, Ser: 1.28 mg/dL — ABNORMAL HIGH (ref 0.61–1.24)
GFR calc Af Amer: >60 mL/min (ref >60)
GFR calc non Af Amer: >60 mL/min (ref >60)
Glucose, Bld: 123 mg/dL — ABNORMAL HIGH (ref 70–99)
Potassium: 4.4 mmol/L (ref 3.5–5.1)
Sodium: 137 mmol/L (ref 135–145)
Total Bilirubin: 1.6 mg/dL — ABNORMAL HIGH (ref 0.3–1.2)
Total Protein: 7 g/dL (ref 6.5–8.1)

## 2020-01-10 LAB — CREATININE, SERUM
Creatinine, Ser: 1.11 mg/dL (ref 0.61–1.24)
GFR calc Af Amer: >60 mL/min (ref >60)
GFR calc non Af Amer: >60 mL/min (ref >60)

## 2020-01-10 LAB — CBC
HCT: 42.4 % (ref 39.0–52.0)
Hemoglobin: 14.9 g/dL (ref 13.0–17.0)
MCH: 36.3 pg — ABNORMAL HIGH (ref 26.0–34.0)
MCHC: 35.1 g/dL (ref 30.0–36.0)
MCV: 103.4 fL — ABNORMAL HIGH (ref 80.0–100.0)
Platelets: 150 10*3/uL (ref 150–400)
RBC: 4.1 MIL/uL — ABNORMAL LOW (ref 4.22–5.81)
RDW: 11.6 % (ref 11.5–15.5)
WBC: 3.5 10*3/uL — ABNORMAL LOW (ref 4.0–10.5)
nRBC: 0 % (ref 0.0–0.2)

## 2020-01-10 LAB — LIPASE, BLOOD: Lipase: 44 U/L (ref 11–51)

## 2020-01-10 LAB — MAGNESIUM: Magnesium: 2 mg/dL (ref 1.7–2.4)

## 2020-01-10 LAB — PHOSPHORUS: Phosphorus: 4.6 mg/dL (ref 2.5–4.6)

## 2020-01-10 LAB — SARS CORONAVIRUS 2 BY RT PCR (HOSPITAL ORDER, PERFORMED IN ~~LOC~~ HOSPITAL LAB): SARS Coronavirus 2: NEGATIVE

## 2020-01-10 MED ORDER — LACTATED RINGERS IV SOLN: INTRAVENOUS | Status: DC

## 2020-01-10 MED ORDER — METRONIDAZOLE IN NACL 5-0.79 MG/ML-% IV SOLN
500.0000 mg | Freq: Once | INTRAVENOUS | Status: AC
  Administered 2020-01-10: 500 mg via INTRAVENOUS
  Filled 2020-01-10: qty 100

## 2020-01-10 MED ORDER — ACETAMINOPHEN 325 MG PO TABS
650.0000 mg | ORAL_TABLET | Freq: Four times a day (QID) | ORAL | Status: DC | PRN
  Administered 2020-01-10: 650 mg via ORAL
  Filled 2020-01-10: qty 2

## 2020-01-10 MED ORDER — MAGNESIUM CITRATE PO SOLN
0.5000 | Freq: Once | ORAL | Status: AC
  Administered 2020-01-10: 0.5 via ORAL
  Filled 2020-01-10: qty 296

## 2020-01-10 MED ORDER — ALBUTEROL SULFATE (2.5 MG/3ML) 0.083% IN NEBU: 2.5000 mg | INHALATION_SOLUTION | Freq: Four times a day (QID) | RESPIRATORY_TRACT | Status: DC | PRN

## 2020-01-10 MED ORDER — ONDANSETRON HCL 4 MG PO TABS: 4.0000 mg | ORAL_TABLET | Freq: Four times a day (QID) | ORAL | Status: DC | PRN

## 2020-01-10 MED ORDER — BICTEGRAVIR-EMTRICITAB-TENOFOV 50-200-25 MG PO TABS
1.0000 | ORAL_TABLET | Freq: Every day | ORAL | Status: DC
  Administered 2020-01-10 – 2020-01-12 (×2): 1 via ORAL
  Filled 2020-01-10 (×3): qty 1

## 2020-01-10 MED ORDER — ONDANSETRON HCL 4 MG/2ML IJ SOLN: 4.0000 mg | Freq: Four times a day (QID) | INTRAMUSCULAR | Status: DC | PRN

## 2020-01-10 MED ORDER — POLYETHYLENE GLYCOL 3350 17 G PO PACK
17.0000 g | PACK | Freq: Two times a day (BID) | ORAL | Status: DC
  Administered 2020-01-10 (×2): 17 g via ORAL
  Filled 2020-01-10 (×2): qty 1

## 2020-01-10 MED ORDER — ACETAMINOPHEN 650 MG RE SUPP: 650.0000 mg | Freq: Four times a day (QID) | RECTAL | Status: DC | PRN

## 2020-01-10 MED ORDER — METRONIDAZOLE IN NACL 5-0.79 MG/ML-% IV SOLN
500.0000 mg | Freq: Three times a day (TID) | INTRAVENOUS | Status: DC
  Administered 2020-01-10 (×2): 500 mg via INTRAVENOUS
  Filled 2020-01-10 (×3): qty 100

## 2020-01-10 MED ORDER — SENNA 8.6 MG PO TABS
2.0000 | ORAL_TABLET | Freq: Two times a day (BID) | ORAL | Status: DC
  Administered 2020-01-10 (×2): 17.2 mg via ORAL
  Filled 2020-01-10 (×3): qty 2

## 2020-01-10 MED ORDER — FLEET ENEMA 7-19 GM/118ML RE ENEM
1.0000 | ENEMA | Freq: Once | RECTAL | Status: AC
  Administered 2020-01-10: 1 via RECTAL
  Filled 2020-01-10: qty 1

## 2020-01-10 MED ORDER — CLOTRIMAZOLE 1 % EX OINT: TOPICAL_OINTMENT | CUTANEOUS | 0 refills | Status: DC

## 2020-01-10 MED ORDER — ENOXAPARIN SODIUM 40 MG/0.4ML ~~LOC~~ SOLN
40.0000 mg | SUBCUTANEOUS | Status: DC
  Administered 2020-01-10: 40 mg via SUBCUTANEOUS
  Filled 2020-01-10: qty 0.4

## 2020-01-10 MED ORDER — SODIUM CHLORIDE 0.9 % IV SOLN
2.0000 g | INTRAVENOUS | Status: DC
  Administered 2020-01-11: 2 g via INTRAVENOUS
  Filled 2020-01-10: qty 20

## 2020-01-10 MED ORDER — CLOTRIMAZOLE 1 % EX CREA
TOPICAL_CREAM | Freq: Two times a day (BID) | CUTANEOUS | Status: DC
  Filled 2020-01-10: qty 15

## 2020-01-10 MED ORDER — SODIUM CHLORIDE 0.9 % IV SOLN
2.0000 g | Freq: Once | INTRAVENOUS | Status: AC
  Administered 2020-01-10: 2 g via INTRAVENOUS
  Filled 2020-01-10: qty 20

## 2020-01-10 MED ORDER — IOHEXOL 300 MG/ML  SOLN
100.0000 mL | Freq: Once | INTRAMUSCULAR | Status: AC | PRN
  Administered 2020-01-10: 100 mL via INTRAVENOUS

## 2020-01-10 NOTE — ED Notes (Signed)
Attempted to call report, receiving RN busy. Number left, will call back

## 2020-01-10 NOTE — ED Notes (Signed)
Patient provided with ginger ale. Denies further needs at this time. AAOx4. NAD.

## 2020-01-10 NOTE — Consult Note (Signed)
[facility] Surgery Consult Note  Jeremiah [name] 1971/02/25  [id number].    Requesting MD: [place] Complaint/Reason for Consult: Appendicitis  HPI:  Patient is a 48 year old male who presented to [facility] from internal medicine clinic with abdominal pain. Patient reports abdominal pain started Saturday across entire lower abdomen. Pain was crampy and intermittent in nature. Pain is now mostly in RLQ and is more of a soreness with palpation. Patient denies fever, chills, nausea, vomiting, diarrhea. He reports some constipation over the last several days. He was able to defecate small amounts yesterday and the day before but still feels like he needs to have a BM. He occasionally takes an "action medicine" to help him have a BM every now and then but can't remember what it's called. Patient does have a hx of rectal cancer that was treated with chemo and radiation previously in [facility] but was seen by Dr. Marcello Moores in our practice in Jan 2021 and there was no concern for recurrence at that time. PMH otherwise significant for HTN, HIV on ART therapy, and COPD. He does not take any blood thinning medications. Past abdominal surgery includes inguinal hernia repair. Patient works as a Curator. He reports he drinks about 1.5 pints most days, smokes 3-4 black and milds daily. Denies illicit drug use.   ROS: Review of Systems  Constitutional: Negative for chills, fever and weight loss.  Respiratory: Negative for shortness of breath and wheezing.   Cardiovascular: Negative for chest pain and palpitations.  Gastrointestinal: Positive for abdominal pain and constipation. Negative for blood in stool, diarrhea, melena, nausea and vomiting.  Genitourinary: Negative for dysuria, frequency and urgency.  All other systems reviewed and are negative.   Family History  Problem Relation Age of Onset  . Diabetes Mother   . Hypertension Mother   . Cancer Mother        breast  . Diabetes Father   . Hypertension  Father     Past Medical History:  Diagnosis Date  . Anal cancer ([place])   . Anal warts   . Carpal tunnel syndrome    Right  . COPD (chronic obstructive pulmonary disease) ([place])   . Depression   . Hemorrhoids 02/25/2015  . History of tuberculin skin testing within last year 2014  . HIV (human immunodeficiency virus infection) ([place])   . Hypertension   . Latent syphilis   . Lung nodule    Right upper lobe  . Pneumonia 2007   x2  . Pneumothorax 07/01/2018   LEFT  . Rectal cancer [facility])     Past Surgical History:  Procedure Laterality Date  . CHEST TUBE INSERTION ([place] HX) Left 06/30/2018  . CONDYLOMA EXCISION/FULGURATION    . HERNIA REPAIR  1991   Abdominal   . IR RADIOLOGIST EVAL & MGMT  05/17/2019  . IR RADIOLOGY PERIPHERAL GUIDED IV START  05/20/2019  . IR TRANSCATH RETRIEVAL FB INCL GUIDANCE (MS)  05/20/2019  . IR US GUIDE VASC ACCESS RIGHT  05/20/2019  . PORT-A-CATH REMOVAL N/A 05/11/2019   Procedure: PARTIAL PORT REMOVAL;  Surgeon: Leighton Ruff, MD;  [facility];  Service: General;  Laterality: N/A;  . unilateral orchiectomy    . VIDEO ASSISTED THORACOSCOPY (VATS)/WEDGE RESECTION Right 06/23/2018   Procedure: VIDEO ASSISTED THORACOSCOPY (VATS)/LUNG RESECTION, stapling and dissection of apical bleb, wedge resection of right upper lobe mass, lymph node dissection, intercostal nerve block;  Surgeon: Grace Isaac, MD;  Location: [facility];  Service: Thoracic;  Laterality:  Right;  Marland Kitchen VIDEO BRONCHOSCOPY N/A 06/23/2018   Procedure: VIDEO BRONCHOSCOPY;  Surgeon: Grace Isaac, MD;  Location: [facility] OR;  Service: Thoracic;  Laterality: N/A;    Social History:  reports that he has been smoking cigars. He has a 2.70 pack-year smoking history. He has never used smokeless tobacco. He reports current alcohol use. He reports previous drug use.  Allergies:  Allergies  Allergen Reactions  . Nsaids Other (See Comments)    DRUG INTERACTION WITH HIV MEDS  . Sulfa  Antibiotics Itching    (Not in a hospital admission)   Blood pressure 118/90, pulse 60, temperature 98.6 F (37 C), temperature source Oral, resp. rate 15, height 5\' 11"  (1.803 m), weight 68.5 kg, SpO2 98 %. Physical Exam:  General: pleasant, WD, WN male who is laying in bed in NAD HEENT: Sclera are anicteric.  PERRL.  Ears and nose without any masses or lesions.  Mouth is pink and moist Heart: regular, rate, and rhythm.  Normal s1,s2. No obvious murmurs, gallops, or rubs noted.  Palpable radial and pedal pulses bilaterally Lungs: CTAB, no wheezes, rhonchi, or rales noted.  Respiratory effort nonlabored Abd: soft, moderately ttp in RLQ but no peritonitis, no guarding, ND, +BS GU: no rectal stenosis or hemorrhoids obvious externally MS: all 4 extremities are symmetrical with no cyanosis, clubbing, or edema. Skin: warm and dry with no masses, lesions, or rashes Neuro: Cranial nerves 2-12 grossly intact, sensation grossly intact throughout  Psych: A&Ox3 with an appropriate affect.   Results for orders placed or performed during the hospital encounter of 01/09/20 (from the past 48 hour(s))  Comprehensive metabolic panel     Status: Abnormal   Collection Time: 01/10/20  2:23 AM  Result Value Ref Range   Sodium 137 135 - 145 mmol/L   Potassium 4.4 3.5 - 5.1 mmol/L   Chloride 101 98 - 111 mmol/L   CO2 28 22 - 32 mmol/L   Glucose, Bld 123 (H) 70 - 99 mg/dL    Comment: Glucose reference range applies only to samples taken after fasting for at least 8 hours.   BUN 12 6 - 20 mg/dL   Creatinine, Ser 1.28 (H) 0.61 - 1.24 mg/dL   Calcium 9.6 8.9 - 10.3 mg/dL   Total Protein 7.0 6.5 - 8.1 g/dL   Albumin 4.4 3.5 - 5.0 g/dL   AST 91 (H) 15 - 41 U/L   ALT 42 0 - 44 U/L   Alkaline Phosphatase 51 38 - 126 U/L   Total Bilirubin 1.6 (H) 0.3 - 1.2 mg/dL   GFR calc non Af Amer >60 >60 mL/min   GFR calc Af Amer >60 >60 mL/min   Anion gap 8 5 - 15    Comment: Performed at [facility] [address], [place], [place] [address]  CBC with Differential     Status: Abnormal   Collection Time: 01/10/20  2:23 AM  Result Value Ref Range   WBC 5.4 4.0 - 10.5 K/uL   RBC 4.27 4.22 - 5.81 MIL/uL   Hemoglobin 16.0 13.0 - 17.0 g/dL   HCT 44.9 39 - 52 %   MCV 105.2 (H) 80.0 - 100.0 fL   MCH 37.5 (H) 26.0 - 34.0 pg   MCHC 35.6 30.0 - 36.0 g/dL   RDW 11.6 11.5 - 15.5 %   Platelets 176 150 - 400 K/uL   nRBC 0.0 0.0 - 0.2 %   Neutrophils Relative % 58 %   Neutro  Abs 3.2 1.7 - 7.7 K/uL   Lymphocytes Relative 26 %   Lymphs Abs 1.4 0.7 - 4.0 K/uL   Monocytes Relative 9 %   Monocytes Absolute 0.5 0 - 1 K/uL   Eosinophils Relative 5 %   Eosinophils Absolute 0.3 0 - 0 K/uL   Basophils Relative 1 %   Basophils Absolute 0.0 0 - 0 K/uL   Immature Granulocytes 1 %   Abs Immature Granulocytes 0.04 0.00 - 0.07 K/uL    Comment: Performed at [facility] [address], [place], [place] [address]  Urinalysis, Routine w reflex microscopic Urine, Clean Catch     Status: Abnormal   Collection Time: 01/10/20  2:30 AM  Result Value Ref Range   Color, Urine YELLOW YELLOW   APPearance CLEAR CLEAR   Specific Gravity, Urine 1.018 1.005 - 1.030   pH 5.0 5.0 - 8.0   Glucose, UA NEGATIVE NEGATIVE mg/dL   Hgb urine dipstick SMALL (A) NEGATIVE   Bilirubin Urine NEGATIVE NEGATIVE   Ketones, ur NEGATIVE NEGATIVE mg/dL   Protein, ur NEGATIVE NEGATIVE mg/dL   Nitrite NEGATIVE NEGATIVE   Leukocytes,Ua NEGATIVE NEGATIVE   RBC / HPF 0-5 0 - 5 RBC/hpf   Bacteria, UA NONE SEEN NONE SEEN   Mucus PRESENT    Hyaline Casts, UA PRESENT     Comment: Performed at [facility], [place] [address], [place], [place] [address]  Lipase, blood     Status: None   Collection Time: 01/10/20 10:30 AM  Result Value Ref Range   Lipase 44 11 - 51 U/L    Comment: Performed at [facility] [address] [place], [place] [address]   CT Abdomen Pelvis W Contrast  Result Date: 01/10/2020 CLINICAL DATA:  Lower  abdominal pain EXAM: CT ABDOMEN AND PELVIS WITH CONTRAST TECHNIQUE: Multidetector CT imaging of the abdomen and pelvis was performed using the standard protocol following bolus administration of intravenous contrast. CONTRAST:  114mL OMNIPAQUE IOHEXOL 300 MG/ML  SOLN COMPARISON:  May 22, 2018 FINDINGS: Lower chest: There is scarring in the lung bases. There is no lung base edema or airspace opacity. Hepatobiliary: There is hepatic steatosis. No focal liver lesions are appreciable. Gallbladder wall is not appreciably thickened. There is no biliary duct dilatation. Pancreas: No pancreatic mass or inflammatory focus. Spleen: No splenic lesions are evident. Adrenals/Urinary Tract: Adrenals bilaterally appear unremarkable. There is a 5 x 4 mm probable cyst along the lateral mid left kidney. There is no evident hydronephrosis on either side. There is no evident renal or ureteral calculus on either side. Urinary bladder is midline with wall thickness within normal limits. Stomach/Bowel: There is no appreciable bowel wall or mesenteric thickening. The terminal ileum appears normal. There is no bowel obstruction. There is no free air or portal venous air. Vascular/Lymphatic: No abdominal aortic aneurysm. No arterial vascular lesions are evident. Major venous structures appear patent. There is no evident adenopathy in the abdomen or pelvis. Reproductive: Prostate and seminal vesicles are normal in size and contour. No evident pelvic mass. Other: The appendix is distended, measuring up to 12 mm in length. There is equivocal enhancement along portions of the wall of the appendix. There is no appendicolith evident. There is no associated abscess or perforation appreciable. No abscess or ascites evident in the abdomen or pelvis. Musculoskeletal: No blastic or lytic bone lesions. No intramuscular or abdominal wall lesions are evident. IMPRESSION: 1.  Findings indicative of acute appendicitis. Appendix: Location: Appendix  arises inferomedially from the cecum with the appendix in the right pelvis at the mid iliac crest level. Diameter: Up to 12 mm Appendicolith: None Mucosal hyper-enhancement: Mild Extraluminal gas: None Periappendiceal collection: None 2. No bowel wall thickening or bowel obstruction. No abscess in the abdomen or pelvis. 3. No renal or ureteral calculus. No hydronephrosis. Urinary bladder wall thickness normal. 4.  Hepatic steatosis. Critical Value/emergent results were called by telephone at the time of interpretation on 01/10/2020 at 10:05 am to provider Mali Grose, RN, who verbally acknowledged these results. Electronically Signed   By: Lowella Grip III M.D.   On: 01/10/2020 10:09   DG Toe 5th Left  Result Date: 01/10/2020 CLINICAL DATA:  Pain at base of 5th toe.  Skin infection. EXAM: DG TOE 5TH LEFT COMPARISON:  05/26/2019 FINDINGS: Deformity of the distal phalanx of the left 5th toe is unchanged since prior study. This could be developmental or related to old injury or infection. No bone destruction to suggest acute osteomyelitis. No fracture, subluxation or dislocation. IMPRESSION: No acute bony abnormality. Electronically Signed   By: Rolm Baptise M.D.   On: 01/10/2020 09:09      Assessment/Plan Hx of anal cancer s/p chemo and radiation  HIV COPD HTN  Abdominal pain Enlarged appendix on CT Severe Constipation  - CT shows enlarged appendix 12 mm but no periappendiceal stranding or appendicolith, no sig of perforation or abscess; CT also shows large stool burden throughout colon, although this is not noted in report - WBC 5.4 and afebrile, not tachycardic or toxic appearing - Patient certainly could have early appendicitis, although with pain since Saturday I would expect more inflammation on CT and leukocytosis. Symptoms could also be secondary to severe constipation.  - I recommend medical admission and treatment of constipation and IV abx for possible appendicitis with serial  abdominal exams - if no improvement by tomorrow will plan for laparoscopic appendectomy but if pain and constipation are resolved tomorrow then I think he could potentially go home on PO abx and bowel regimen - general surgery will follow closely  FEN: CLD, IVF VTE: ok to have chemical prophylaxis from a surgical standpoint  ID: [id number]/21>>  Norm Parcel, [facility] Surgery 01/10/2020, 11:36 AM Please see Amion for pager number during day hours 7:00am-4:30pm

## 2020-01-10 NOTE — ED Notes (Addendum)
Patient ambulated to and from restroom independently with steady gait, reports had BM. Reconnected patient to IV fluids and vital sign monitor. Denies further needs.

## 2020-01-10 NOTE — H&P (Addendum)
Date: 01/10/2020               Patient Name:  Jeremiah [name] MRN: [id number]  DOB: 04/08/1971 Age / Sex: 49 y.o., male   PCP: Lacinda Axon, MD              Medical Service: Internal Medicine Teaching Service              Attending Physician: Dr. Evette Doffing, Mallie Mussel, *    First Contact:  Jacklynn Bue, MS  Pager: [phone]  Second Contact: Dr. Iona Beard Pager: [phone]  Third Contact Dr. Marianna Payment Pager: [phone]       After Hours (After 5p/  First Contact Pager: [phone]  weekends / holidays): Second Contact Pager: [phone]   Chief Complaint: RLQ abdominal pain  History of Present Illness: Jeremiah [name] is a 49 yo male with a past medical history of HIV (taking Biktarvy), anal cancer, Hyperlipidemia, COPD that presented to [facility] with abdominal pain that he states started on Saturday/Sunday night. He reports that the pain began in the lower abdomen, and radiated to the right lower quadrant.He describes the pain as a throbbing ache and originally rated it 10/10. He denies any fever chills, nausea, vomiting, or diarrhea and reports a small bowel movement in which he had to strain. He denies any pain with defecation or dysuria. He was seen in clinic on Monday for his symptoms and sent to the ED for further evaluation. He currently reports that his pain is improved and rates it 5/10. He does not report any pain with movement.   He also reports that his experiencing numbness in the periumbilical region that radiates into his chest.   He also reports pain of the 5th digit of his Left foot. He reports having previous similar symptoms and has seen a podiatrist in the past. He reports that his symptoms have worsened over the last two weeks.He has tried applying antibiotic ointment with no relief.  ED course: Evaluated in the ED for acute appendicitis and skin infection of Left 5th digit, Vitals stable on admission. CT Abdomen/Pelvis was significant distended with diameter up to 36mm  appendix,without abscess or perforation, indicative of acute appendicitis  and hepatic steatosis. X-ray of Left 5 digit showed no acute bony abnormality or infection. He was given Gen surgery and ITMS consulted.  Meds:  Current Meds  Medication Sig  . albuterol (VENTOLIN HFA) 108 (90 Base) MCG/ACT inhaler Inhale 1-2 puffs into the lungs every 6 (six) hours as needed for wheezing or shortness of breath.  . bictegravir-emtricitabine-tenofovir AF (BIKTARVY) 50-200-25 MG TABS tablet Take 1 tablet by mouth daily.  . hydrochlorothiazide (HYDRODIURIL) 25 MG tablet TAKE 1 TABLET(25 MG) BY MOUTH DAILY (Patient taking differently: Take 25 mg by mouth daily. )     Allergies: Allergies as of 01/09/2020 - Review Complete 01/09/2020  Allergen Reaction Noted  . Nsaids Other (See Comments) 02/25/2015  . Sulfa antibiotics Itching 02/25/2015   Past Medical History:  Diagnosis Date  . Anal cancer ([place])   . Anal warts   . Carpal tunnel syndrome    Right  . COPD (chronic obstructive pulmonary disease) ([place])   . Depression   . Hemorrhoids 02/25/2015  . History of tuberculin skin testing within last year 2014  . HIV (human immunodeficiency virus infection) ([place])   . Hypertension   . Latent syphilis   . Lung nodule    Right upper lobe  . Pneumonia 2007   x2  . Pneumothorax  07/01/2018   LEFT  . Rectal cancer ([place])     Family History: Mother passed this year of January. No pertinent family medical history.   Social History: Jeremiah [name] lives alone, but has a brother and sister that live near by in [place]. He works part-time as a Curator. He does smoke tobacco. He reports that he drinks up to 1.5 pint of alcohol a day. He denies any recreational substance use.   Review of Systems: A complete ROS was negative except as per HPI.   Physical Exam: Blood pressure (!) 140/99, pulse (!) 57, temperature 98.6 F (37 C), temperature source Oral, resp. rate 16, height 5\' 11"  (1.803 m), weight 68.5 kg,  SpO2 100 %.  Physical Exam Constitutional:      General: He is not in acute distress.    Appearance: He is well-developed.  HENT:     Head: Normocephalic and atraumatic.  Eyes:     Extraocular Movements: Extraocular movements intact.     Pupils: Pupils are equal, round, and reactive to light.  Cardiovascular:     Rate and Rhythm: Normal rate and regular rhythm.     Pulses:          Dorsalis pedis pulses are 2+ on the right side and 2+ on the left side.     Heart sounds: Normal heart sounds.  Pulmonary:     Effort: No respiratory distress.  Abdominal:     General: Abdomen is flat. Bowel sounds are normal. There is no distension.     Palpations: Abdomen is soft.     Tenderness: There is abdominal tenderness in the right lower quadrant and periumbilical area. There is no guarding or rebound. Positive signs include psoas sign and obturator sign. Negative signs include Rovsing's sign.  Feet:     Right foot:     Skin integrity: Dry skin present.     Toenail Condition: Fungal disease present.    Left foot:     Skin integrity: Dry skin present.     Toenail Condition: Fungal disease present.    Comments: Interdigit white scale. Skin cracking and white oozing between 4th and 5th digit on L. Foot.  Skin:    General: Skin is warm and dry.  Neurological:     General: No focal deficit present.     Mental Status: He is alert and oriented to person, place, and time.  Psychiatric:        Behavior: Behavior normal.     Comments: Mildly anxious    Assessment & Plan by Problem: Active Problems:   Human immunodeficiency virus (HIV) disease ([place])   Foot pain, left   Appendicitis  Jeremiah [name] is 49 yo male with medical history of HIV(on Biktarvy), HLD,  anal cancer, Hyperlipidemia, COPD that presented for 2 day history of RLQ abdominal pain, and sent to [facility] from Internal Medicine clinic for further evaluation for acute appendicitis.   Appendicitis Patient presents with 2 history of  abdominal pain that started in lower abdomen and radiated to RLQ. He does not report any fevers, chills, nausea, vomiting or diarrhea. He was stable on presentation with no concerns for sepsis. Labs were unremarkable, but Physical Exam was positive for RLQ TTP, Roving's Sign, and Psoas/Obturator signs. CTAP was also significant for acute appendicitis. General Surgery is following and evaluating for early appendicitis vs severe constipation and recommended IV antibiotics with Ceftriaxone and Metronidazole along with constipation treatment. They will plan for Lap.Appendectomy if no improvement in symptoms overnight. We  will monitor and manage symptoms and re-evaluate tomorrow.  - continue IV abx (Ceftriaxone and Metronidazole) - Zofran prn - Acetaminophen 650mg  prn  - morning CBC  - Appreciate surgery's recommendations   Tinea pedis  Pain has previous history of tinea pedis. He has seen a podiatrist in the past, but reported symptoms have worsened over the last two weeks. Physical exam of feet was consistent with tinea pedis between all toes and skin cracking between 4th and 5th digit of L.foot. Provided topical antifungals in ED. --continue Clotrimazole 1% cream  Constipation: - Will start Senokot 2 tablets BID and Miralax BID  HIV  Patient is currently taking Biktary He reports adherence to meds and sees Dr. Megan Salon. We will continue his home meds.  - Will repeat CD4 and RNA level - Continue Biktarvy  Dispo: Admit patient to Observation with expected length of stay less than 2 midnights.  Jacklynn Bue, [place]  Attestation for Student Documentation:  I personally was present and performed or re-performed the history, physical exam and medical decision-making activities of this service and have verified that the service and findings are accurately documented in the student's note.  Marianna Payment, MD 01/10/2020, 6:22 PM   Signed: Marianna Payment, MD 01/10/2020, 6:17 PM  Pager:  [phone]

## 2020-01-10 NOTE — Progress Notes (Signed)
Internal Medicine Clinic Attending ? ?Case discussed with Dr. Winters  At the time of the visit.  We reviewed the resident?s history and exam and pertinent patient test results.  I agree with the assessment, diagnosis, and plan of care documented in the resident?s note.  ?

## 2020-01-10 NOTE — Hospital Course (Addendum)
Jeremiah [name] is 49 yo male with medical history of HIV(on Biktarvy), HLD,  anal cancer, Hyperlipidemia, COPD that presented for 2 day history of RLQ abdominal pain, and sent to [facility] from Internal Medicine clinic for further evaluation for acute appendicitis. Admitted on 01/11/20.   Appendicitis Patient presented with 2 history of abdominal pain that started in lower abdomen and radiated to RLQ. On presentation, vitals were stable with no leukocytosis or signs of sepsis. CT scan was consistent with Acute Appendicitis.  After receiving 1 day of antibiotics with Ceftriaxone and Metronidazole, and bowel regimen with Senna and Miralax, General Surgery performed a Laparoscopic Appendectomy. Prescribed 3 day dose of Oxycodone for pain management. He should follow up with Gen. Surgery and PCP.   Tinea pedis  Pain has previous history of tinea pedis. He has seen a podiatrist in the past, but reported symptoms have worsened over the last two weeks. Physical exam of feet was consistent with tinea pedis between all toes and skin cracking between 4th and 5th digit of L.foot. Provided topical antifungals in ED. He should continue Clotrimazole 1% cream and follow up with PCP.    HIV  Patient is currently taking Biktary He reports adherence to meds and sees Dr. Megan Salon. We will continue his home meds. His CD4 count is 187 and decreased from 324. 6 months ago. Viral load is still undetectable. He will follow up with Dr. Megan Salon 4 weeks post discharge.    Hypertension He was previously prescribed HCTZ 25mg . We held his home medication and his BP has been normotensive during his admission. We held at discharge and he should follow up with PCP for BP management.   Alcohol Use Patient reports that he drinks 1.5 pints of Liquor daily. He has not experienced cravings or withdrawal. CT A/P showed evidence of Liver Steatosis. We discussed reducing alcohol intake and is amenable to moderation.  He may benefit from further  education in outpatient setting

## 2020-01-10 NOTE — ED Notes (Signed)
[facility] resident at bedside

## 2020-01-10 NOTE — ED Notes (Addendum)
When entering pt room to start antibiotics pt asking RN for food again at this time. I explained to pt he is still not allowed to eat d/t appendicitis diagnosis and potential surgery. Pt states "well I just ate a cookie". When asked why he ate after being told not to patient stated "because I was hungry". Dr. Rex Kras made aware.

## 2020-01-10 NOTE — ED Notes (Signed)
Pt asking this RN and NT for food. Pt informed he is not allowed to eat until results of CT scan have been reviewed by MD and pt is cleared to eat. PT verbalized understanding .

## 2020-01-10 NOTE — ED Provider Notes (Signed)
[facility] EMERGENCY DEPARTMENT Provider Note   CSN: [id number] Arrival date & time: 01/09/20  1744     History Chief Complaint  Patient presents with  . Abdominal Pain  . Foot Pain    Jeremiah [name] is a 49 y.o. male.  49 year old male with past medical history below including HIV on ART, anal cancer, HLD, COPD who p/w abdominal pain and left toe pain.  Patient states that 2 days ago, he had a relatively sudden onset of abdominal pain that is worse on his right side but is somewhat migratory and constant with intermittent flareups.  It is sometimes worse with walking.  He reports eating and drinking normally yesterday prior to arrival.  He denies any vomiting, diarrhea, or urinary symptoms.  No fevers or URI symptoms.  Patient also reports recent problem with pain at the base of his left fifth toe.  He had this problem a while ago and it resolved after medication but it has returned.  He has a rash between his toes.  Pt evaluated in internal med clinic and sent here for abdominal CT and foot X ray.  The history is provided by the patient.  Abdominal Pain Foot Pain Associated symptoms include abdominal pain.       Past Medical History:  Diagnosis Date  . Anal cancer ([place])   . Anal warts   . Carpal tunnel syndrome    Right  . COPD (chronic obstructive pulmonary disease) ([place])   . Depression   . Hemorrhoids 02/25/2015  . History of tuberculin skin testing within last year 2014  . HIV (human immunodeficiency virus infection) ([place])   . Hypertension   . Latent syphilis   . Lung nodule    Right upper lobe  . Pneumonia 2007   x2  . Pneumothorax 07/01/2018   LEFT  . Rectal cancer [facility])     Patient Active Problem List   Diagnosis Date Noted  . Right sided abdominal pain 01/09/2020  . Spider bite 11/14/2019  . Anal fissure 08/02/2019  . Foot pain, left 05/20/2019  . Hypertension 02/21/2019  . Screening for diabetes mellitus 02/21/2019  . Anxiety  09/29/2018  . Pneumothorax on left 06/30/2018  . Lung nodule 06/23/2018  . 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[place]) 01/06/2018  . Anal cancer ([place]) 11/07/2015  . Human immunodeficiency virus (HIV) disease ([place]) 05/19/2013  . Dyslipidemia 05/19/2013  . Cigarette smoker 05/19/2013  . Depression 05/19/2013  . Dental caries 05/19/2013  . Hx of unilateral orchiectomy 05/19/2013  . Latent syphilis 05/06/2013  . Anal warts 05/06/2013  . History of chlamydia 05/06/2013    Past Surgical History:  Procedure Laterality Date  . CHEST TUBE INSERTION ([place] HX) Left 06/30/2018  . CONDYLOMA EXCISION/FULGURATION    . HERNIA REPAIR  1991   Abdominal   . IR RADIOLOGIST EVAL & MGMT  05/17/2019  . IR RADIOLOGY PERIPHERAL GUIDED IV START  05/20/2019  . IR TRANSCATH RETRIEVAL FB INCL GUIDANCE (MS)  05/20/2019  . IR US GUIDE VASC ACCESS RIGHT  05/20/2019  . PORT-A-CATH REMOVAL N/A 05/11/2019   Procedure: PARTIAL PORT REMOVAL;  Surgeon: Leighton Ruff, MD;  Location: [facility];  Service: General;  Laterality: N/A;  . unilateral orchiectomy    . VIDEO ASSISTED THORACOSCOPY (VATS)/WEDGE RESECTION Right 06/23/2018   Procedure: VIDEO ASSISTED THORACOSCOPY (VATS)/LUNG RESECTION, stapling and dissection of apical bleb, wedge resection of right upper lobe mass, lymph node dissection, intercostal nerve block;  Surgeon: Grace Isaac, MD;  Location: [facility]  OR;  Service: Thoracic;  Laterality: Right;  Marland Kitchen VIDEO BRONCHOSCOPY N/A 06/23/2018   Procedure: VIDEO BRONCHOSCOPY;  Surgeon: Grace Isaac, MD;  Location: [facility] OR;  Service: Thoracic;  Laterality: N/A;       Family History  Problem Relation Age of Onset  . Diabetes Mother   . Hypertension Mother   . Cancer Mother        breast  . Diabetes Father   . Hypertension Father     Social History   Tobacco Use  . Smoking status: Current Every Day Smoker    Packs/day: 0.10    Years: 27.00    Pack years: 2.70    Types: Cigars  . Smokeless tobacco: Never Used   . Tobacco comment: 4 cigars a day  Vaping Use  . Vaping Use: Never used  Substance Use Topics  . Alcohol use: Yes    Alcohol/week: 0.0 standard drinks    Comment: less than 40 oz a week  . Drug use: Not Currently    Comment: Past history of crack cocaine  use     Home Medications Prior to Admission medications   Medication Sig Start Date End Date Taking? Authorizing Provider  albuterol (VENTOLIN HFA) 108 (90 Base) MCG/ACT inhaler Inhale 1-2 puffs into the lungs every 6 (six) hours as needed for wheezing or shortness of breath. 10/25/18   Lauraine Rinne, NP  bictegravir-emtricitabine-tenofovir AF (BIKTARVY) 50-200-25 MG TABS tablet Take 1 tablet by mouth daily. 09/16/19   Michel Bickers, MD  Clotrimazole 1 % OINT Apply thin layer on affected areas of toes/feet twice daily for 1 week or until rash is resolved 01/10/20   Catalyna Reilly, Wenda Overland, MD  hydrochlorothiazide (HYDRODIURIL) 25 MG tablet TAKE 1 TABLET(25 MG) BY MOUTH DAILY 10/27/19   Marianna Payment, MD  HYDROcodone-acetaminophen (NORCO/VICODIN) 5-325 MG tablet Take 1 tablet by mouth every 6 (six) hours as needed for moderate pain. 10/02/19   Fredia Sorrow, MD  lidocaine (XYLOCAINE) 2 % jelly Apply 1 application topically as needed. Apply to the affected area as needed 08/02/19   Maudie Mercury, MD  nicotine (NICODERM CQ - DOSED IN MG/24 HOURS) 14 mg/24hr patch Place 1 patch (14 mg total) onto the skin daily. 04/05/19   Mannam, Hart Robinsons, MD  varenicline (CHANTIX PAK) 0.5 MG X 11 & 1 MG X 42 tablet Take one 0.5 mg tablet by mouth once daily for 3 days, then increase to one 0.5 mg tablet twice daily for 4 days, then increase to one 1 mg tablet twice daily. 04/05/19   Marshell Garfinkel, MD    Allergies    Nsaids and Sulfa antibiotics  Review of Systems   Review of Systems  Gastrointestinal: Positive for abdominal pain.   All other systems reviewed and are negative except that which was mentioned in HPI  Physical Exam Updated Vital Signs BP  118/90   Pulse 60   Temp 98.6 F (37 C) (Oral)   Resp 15   Ht 5\' 11"  (1.803 m)   Wt 68.5 kg   SpO2 98%   BMI 21.06 kg/m   Physical Exam Vitals and nursing note reviewed.  Constitutional:      General: He is not in acute distress.    Appearance: He is well-developed.  HENT:     Head: Normocephalic and atraumatic.  Eyes:     Conjunctiva/sclera: Conjunctivae normal.  Cardiovascular:     Rate and Rhythm: Normal rate and regular rhythm.     Heart sounds: Normal heart  sounds. No murmur heard.   Pulmonary:     Effort: Pulmonary effort is normal.     Breath sounds: Normal breath sounds.  Abdominal:     General: Bowel sounds are normal. There is no distension.     Palpations: Abdomen is soft.     Tenderness: There is abdominal tenderness in the right upper quadrant, right lower quadrant and suprapubic area. There is no guarding or rebound.     Hernia: No hernia is present.  Musculoskeletal:     Cervical back: Neck supple.  Skin:    General: Skin is warm.     Comments: Tinea pedis in interweb space between all toes on L foot and a few scattered areas of R foot; interdigital maceration of skin w/ cracking between L 4th and 5th toe, tender to palpation; no toe or dorsal foot swelling  Neurological:     Mental Status: He is alert and oriented to person, place, and time.     Comments: Fluent speech  Psychiatric:        Judgment: Judgment normal.     ED Results / Procedures / Treatments   Labs (all labs ordered are listed, but only abnormal results are displayed) Labs Reviewed  COMPREHENSIVE METABOLIC PANEL - Abnormal; Notable for the following components:      Result Value   Glucose, Bld 123 (*)    Creatinine, Ser 1.28 (*)    AST 91 (*)    Total Bilirubin 1.6 (*)    All other components within normal limits  CBC WITH DIFFERENTIAL/PLATELET - Abnormal; Notable for the following components:   MCV 105.2 (*)    MCH 37.5 (*)    All other components within normal limits   URINALYSIS, ROUTINE W REFLEX MICROSCOPIC - Abnormal; Notable for the following components:   Hgb urine dipstick SMALL (*)    All other components within normal limits  SARS CORONAVIRUS 2 BY RT PCR (HOSPITAL ORDER, [place] [facility])  LIPASE, BLOOD    EKG None  Radiology CT Abdomen Pelvis W Contrast  Result Date: 01/10/2020 CLINICAL DATA:  Lower abdominal pain EXAM: CT ABDOMEN AND PELVIS WITH CONTRAST TECHNIQUE: Multidetector CT imaging of the abdomen and pelvis was performed using the standard protocol following bolus administration of intravenous contrast. CONTRAST:  143mL OMNIPAQUE IOHEXOL 300 MG/ML  SOLN COMPARISON:  May 22, 2018 FINDINGS: Lower chest: There is scarring in the lung bases. There is no lung base edema or airspace opacity. Hepatobiliary: There is hepatic steatosis. No focal liver lesions are appreciable. Gallbladder wall is not appreciably thickened. There is no biliary duct dilatation. Pancreas: No pancreatic mass or inflammatory focus. Spleen: No splenic lesions are evident. Adrenals/Urinary Tract: Adrenals bilaterally appear unremarkable. There is a 5 x 4 mm probable cyst along the lateral mid left kidney. There is no evident hydronephrosis on either side. There is no evident renal or ureteral calculus on either side. Urinary bladder is midline with wall thickness within normal limits. Stomach/Bowel: There is no appreciable bowel wall or mesenteric thickening. The terminal ileum appears normal. There is no bowel obstruction. There is no free air or portal venous air. Vascular/Lymphatic: No abdominal aortic aneurysm. No arterial vascular lesions are evident. Major venous structures appear patent. There is no evident adenopathy in the abdomen or pelvis. Reproductive: Prostate and seminal vesicles are normal in size and contour. No evident pelvic mass. Other: The appendix is distended, measuring up to 12 mm in length. There is equivocal enhancement along  portions  of the wall of the appendix. There is no appendicolith evident. There is no associated abscess or perforation appreciable. No abscess or ascites evident in the abdomen or pelvis. Musculoskeletal: No blastic or lytic bone lesions. No intramuscular or abdominal wall lesions are evident. IMPRESSION: 1.  Findings indicative of acute appendicitis. Appendix: Location: Appendix arises inferomedially from the cecum with the appendix in the right pelvis at the mid iliac crest level. Diameter: Up to 12 mm Appendicolith: None Mucosal hyper-enhancement: Mild Extraluminal gas: None Periappendiceal collection: None 2. No bowel wall thickening or bowel obstruction. No abscess in the abdomen or pelvis. 3. No renal or ureteral calculus. No hydronephrosis. Urinary bladder wall thickness normal. 4.  Hepatic steatosis. Critical Value/emergent results were called by telephone at the time of interpretation on 01/10/2020 at 10:05 am to provider Mali Grose, RN, who verbally acknowledged these results. Electronically Signed   By: Lowella Grip III M.D.   On: 01/10/2020 10:09   DG Toe 5th Left  Result Date: 01/10/2020 CLINICAL DATA:  Pain at base of 5th toe.  Skin infection. EXAM: DG TOE 5TH LEFT COMPARISON:  05/26/2019 FINDINGS: Deformity of the distal phalanx of the left 5th toe is unchanged since prior study. This could be developmental or related to old injury or infection. No bone destruction to suggest acute osteomyelitis. No fracture, subluxation or dislocation. IMPRESSION: No acute bony abnormality. Electronically Signed   By: Rolm Baptise M.D.   On: 01/10/2020 09:09    Procedures Procedures (including critical care time)  Medications Ordered in ED Medications  cefTRIAXone (ROCEPHIN) 2 g in sodium chloride 0.9 % 100 mL IVPB (2 g Intravenous New Bag/Given 01/10/20 1117)    And  metroNIDAZOLE (FLAGYL) IVPB 500 mg (has no administration in time range)  clotrimazole (LOTRIMIN) 1 % cream (has no administration in  time range)  sodium phosphate (FLEET) 7-19 GM/118ML enema 1 enema (has no administration in time range)  magnesium citrate solution 0.5 Bottle (has no administration in time range)  iohexol (OMNIPAQUE) 300 MG/ML solution 100 mL (100 mLs Intravenous Contrast Given 01/10/20 0949)    ED Course  I have reviewed the triage vital signs and the nursing notes.  Pertinent labs & imaging results that were available during my care of the patient were reviewed by me and considered in my medical decision making (see chart for details).    MDM Rules/Calculators/A&P                          VS normal, exam notable for mild abd tenderness worst in RLQ but present in several areas. given he still has RLQ tenderness on exam, obtained CT abd/pelvis which showed acute unruptured appendicitis. Ordered antibiotics and consulted gen surgery.  Regarding foot, exam c/w athlete's foot. XR foot negative. Will provide w/ medication but also counseled on foot care.   Pt evaluated by gen surgery team. They feel that he has a component of constipation on CT scan and want to observe on antibiotics rather than take to OR at this time. They have requested medicine admission due to patient's comorbidities. Discussed w/ internal medicine teaching service who will admit for further care. Final Clinical Impression(s) / ED Diagnoses Final diagnoses:  Tinea pedis, unspecified laterality  Acute appendicitis with localized peritonitis, without perforation, abscess, or gangrene    Rx / DC Orders ED Discharge Orders         Ordered    Clotrimazole 1 % OINT  01/10/20 0824           Zae Kirtz, Wenda Overland, MD 01/10/20 1152

## 2020-01-10 NOTE — ED Notes (Signed)
Attempted to call report, line busy at this time.

## 2020-01-11 ENCOUNTER — Encounter (HOSPITAL_COMMUNITY): Payer: Self-pay | Admitting: Student in an Organized Health Care Education/Training Program

## 2020-01-11 ENCOUNTER — Observation Stay (HOSPITAL_COMMUNITY): Payer: Medicaid Other | Admitting: Anesthesiology

## 2020-01-11 ENCOUNTER — Encounter (HOSPITAL_COMMUNITY)
Admission: EM | Disposition: A | Discharge status: Home / Self Care | Payer: Self-pay | Source: Home / Self Care | Attending: Student in an Organized Health Care Education/Training Program

## 2020-01-11 DIAGNOSIS — B20 Human immunodeficiency virus [HIV] disease: Secondary | ICD-10-CM | POA: Diagnosis not present

## 2020-01-11 DIAGNOSIS — J449 Chronic obstructive pulmonary disease, unspecified: Secondary | ICD-10-CM | POA: Diagnosis not present

## 2020-01-11 DIAGNOSIS — K5900 Constipation, unspecified: Secondary | ICD-10-CM | POA: Diagnosis not present

## 2020-01-11 DIAGNOSIS — B353 Tinea pedis: Secondary | ICD-10-CM

## 2020-01-11 DIAGNOSIS — Z923 Personal history of irradiation: Secondary | ICD-10-CM | POA: Diagnosis not present

## 2020-01-11 DIAGNOSIS — K760 Fatty (change of) liver, not elsewhere classified: Secondary | ICD-10-CM | POA: Diagnosis not present

## 2020-01-11 DIAGNOSIS — R109 Unspecified abdominal pain: Secondary | ICD-10-CM | POA: Diagnosis not present

## 2020-01-11 DIAGNOSIS — Z85048 Personal history of other malignant neoplasm of rectum, rectosigmoid junction, and anus: Secondary | ICD-10-CM | POA: Diagnosis not present

## 2020-01-11 DIAGNOSIS — Z833 Family history of diabetes mellitus: Secondary | ICD-10-CM | POA: Diagnosis not present

## 2020-01-11 DIAGNOSIS — F17290 Nicotine dependence, other tobacco product, uncomplicated: Secondary | ICD-10-CM | POA: Diagnosis present

## 2020-01-11 DIAGNOSIS — Z886 Allergy status to analgesic agent status: Secondary | ICD-10-CM | POA: Diagnosis not present

## 2020-01-11 DIAGNOSIS — K37 Unspecified appendicitis: Secondary | ICD-10-CM | POA: Diagnosis not present

## 2020-01-11 DIAGNOSIS — E785 Hyperlipidemia, unspecified: Secondary | ICD-10-CM | POA: Diagnosis not present

## 2020-01-11 DIAGNOSIS — R1031 Right lower quadrant pain: Secondary | ICD-10-CM | POA: Diagnosis not present

## 2020-01-11 DIAGNOSIS — Z21 Asymptomatic human immunodeficiency virus [HIV] infection status: Secondary | ICD-10-CM | POA: Diagnosis not present

## 2020-01-11 DIAGNOSIS — K709 Alcoholic liver disease, unspecified: Secondary | ICD-10-CM | POA: Diagnosis not present

## 2020-01-11 DIAGNOSIS — K3580 Unspecified acute appendicitis: Secondary | ICD-10-CM | POA: Diagnosis not present

## 2020-01-11 DIAGNOSIS — Z9221 Personal history of antineoplastic chemotherapy: Secondary | ICD-10-CM | POA: Diagnosis not present

## 2020-01-11 DIAGNOSIS — Z7289 Other problems related to lifestyle: Secondary | ICD-10-CM | POA: Diagnosis not present

## 2020-01-11 DIAGNOSIS — L089 Local infection of the skin and subcutaneous tissue, unspecified: Secondary | ICD-10-CM | POA: Diagnosis not present

## 2020-01-11 DIAGNOSIS — D72819 Decreased white blood cell count, unspecified: Secondary | ICD-10-CM | POA: Diagnosis not present

## 2020-01-11 DIAGNOSIS — M79672 Pain in left foot: Secondary | ICD-10-CM | POA: Diagnosis not present

## 2020-01-11 DIAGNOSIS — Z79899 Other long term (current) drug therapy: Secondary | ICD-10-CM | POA: Diagnosis not present

## 2020-01-11 DIAGNOSIS — Z8249 Family history of ischemic heart disease and other diseases of the circulatory system: Secondary | ICD-10-CM | POA: Diagnosis not present

## 2020-01-11 DIAGNOSIS — I10 Essential (primary) hypertension: Secondary | ICD-10-CM | POA: Diagnosis not present

## 2020-01-11 DIAGNOSIS — Z882 Allergy status to sulfonamides status: Secondary | ICD-10-CM | POA: Diagnosis not present

## 2020-01-11 DIAGNOSIS — N179 Acute kidney failure, unspecified: Secondary | ICD-10-CM | POA: Diagnosis not present

## 2020-01-11 DIAGNOSIS — Z8701 Personal history of pneumonia (recurrent): Secondary | ICD-10-CM | POA: Diagnosis not present

## 2020-01-11 DIAGNOSIS — Z20822 Contact with and (suspected) exposure to covid-19: Secondary | ICD-10-CM | POA: Diagnosis not present

## 2020-01-11 HISTORY — PX: LAPAROSCOPIC APPENDECTOMY: SHX408

## 2020-01-11 LAB — CBC
HCT: 38.4 % — ABNORMAL LOW (ref 39.0–52.0)
Hemoglobin: 13.5 g/dL (ref 13.0–17.0)
MCH: 36.6 pg — ABNORMAL HIGH (ref 26.0–34.0)
MCHC: 35.2 g/dL (ref 30.0–36.0)
MCV: 104.1 fL — ABNORMAL HIGH (ref 80.0–100.0)
Platelets: 135 10*3/uL — ABNORMAL LOW (ref 150–400)
RBC: 3.69 MIL/uL — ABNORMAL LOW (ref 4.22–5.81)
RDW: 11.6 % (ref 11.5–15.5)
WBC: 2.8 10*3/uL — ABNORMAL LOW (ref 4.0–10.5)
nRBC: 0 % (ref 0.0–0.2)

## 2020-01-11 LAB — HIV-1 RNA QUANT-NO REFLEX-BLD
HIV 1 RNA Quant: <20 copies/mL
LOG10 HIV-1 RNA: UNDETERMINED log10copy/mL

## 2020-01-11 LAB — SURGICAL PCR SCREEN
MRSA, PCR: NEGATIVE
Staphylococcus aureus: POSITIVE — AB

## 2020-01-11 LAB — BASIC METABOLIC PANEL
Anion gap: 5 (ref 5–15)
BUN: 5 mg/dL — ABNORMAL LOW (ref 6–20)
CO2: 29 mmol/L (ref 22–32)
Calcium: 9 mg/dL (ref 8.9–10.3)
Chloride: 104 mmol/L (ref 98–111)
Creatinine, Ser: 1.03 mg/dL (ref 0.61–1.24)
GFR calc Af Amer: >60 mL/min (ref >60)
GFR calc non Af Amer: >60 mL/min (ref >60)
Glucose, Bld: 91 mg/dL (ref 70–99)
Potassium: 4.4 mmol/L (ref 3.5–5.1)
Sodium: 138 mmol/L (ref 135–145)

## 2020-01-11 LAB — T-HELPER CELLS (CD4) COUNT (NOT AT ARMC)
CD4 % Helper T Cell: 19 % — ABNORMAL LOW (ref 33–65)
CD4 T Cell Abs: 187 /uL — ABNORMAL LOW (ref 400–1790)

## 2020-01-11 SURGERY — APPENDECTOMY, LAPAROSCOPIC
Anesthesia: General | Site: Abdomen

## 2020-01-11 MED ORDER — EPHEDRINE 5 MG/ML INJ
INTRAVENOUS | Status: AC
  Filled 2020-01-11: qty 10

## 2020-01-11 MED ORDER — BUPIVACAINE HCL (PF) 0.5 % IJ SOLN
INTRAMUSCULAR | Status: AC
  Filled 2020-01-11: qty 30

## 2020-01-11 MED ORDER — HYDROMORPHONE HCL 1 MG/ML IJ SOLN
INTRAMUSCULAR | Status: AC
  Administered 2020-01-11: 0.5 mg via INTRAVENOUS
  Filled 2020-01-11: qty 1

## 2020-01-11 MED ORDER — PHENYLEPHRINE 40 MCG/ML (10ML) SYRINGE FOR IV PUSH (FOR BLOOD PRESSURE SUPPORT)
PREFILLED_SYRINGE | INTRAVENOUS | Status: AC
  Filled 2020-01-11: qty 30

## 2020-01-11 MED ORDER — PROPOFOL 10 MG/ML IV BOLUS
INTRAVENOUS | Status: DC | PRN
  Administered 2020-01-11: 180 mg via INTRAVENOUS

## 2020-01-11 MED ORDER — ACETAMINOPHEN 500 MG PO TABS
1000.0000 mg | ORAL_TABLET | Freq: Four times a day (QID) | ORAL | Status: DC
  Filled 2020-01-11: qty 2

## 2020-01-11 MED ORDER — ROCURONIUM BROMIDE 10 MG/ML (PF) SYRINGE
PREFILLED_SYRINGE | INTRAVENOUS | Status: DC | PRN
  Administered 2020-01-11: 70 mg via INTRAVENOUS

## 2020-01-11 MED ORDER — DEXAMETHASONE SODIUM PHOSPHATE 10 MG/ML IJ SOLN
INTRAMUSCULAR | Status: AC
  Filled 2020-01-11: qty 4

## 2020-01-11 MED ORDER — GABAPENTIN 300 MG PO CAPS
300.0000 mg | ORAL_CAPSULE | Freq: Two times a day (BID) | ORAL | Status: DC
  Administered 2020-01-11 – 2020-01-12 (×2): 300 mg via ORAL
  Filled 2020-01-11 (×2): qty 1

## 2020-01-11 MED ORDER — DIPHENHYDRAMINE HCL 50 MG/ML IJ SOLN
12.5000 mg | Freq: Three times a day (TID) | INTRAMUSCULAR | Status: DC | PRN
  Administered 2020-01-11: 12.5 mg via INTRAVENOUS
  Filled 2020-01-11: qty 1

## 2020-01-11 MED ORDER — DIPHENHYDRAMINE HCL 50 MG/ML IJ SOLN: 12.5000 mg | Freq: Once | INTRAMUSCULAR | Status: AC

## 2020-01-11 MED ORDER — FENTANYL CITRATE (PF) 100 MCG/2ML IJ SOLN
INTRAMUSCULAR | Status: DC | PRN
  Administered 2020-01-11 (×2): 50 ug via INTRAVENOUS

## 2020-01-11 MED ORDER — DOCUSATE SODIUM 100 MG PO CAPS
100.0000 mg | ORAL_CAPSULE | Freq: Two times a day (BID) | ORAL | Status: DC
  Filled 2020-01-11: qty 1

## 2020-01-11 MED ORDER — ONDANSETRON HCL 4 MG/2ML IJ SOLN
INTRAMUSCULAR | Status: AC
  Filled 2020-01-11: qty 8

## 2020-01-11 MED ORDER — 0.9 % SODIUM CHLORIDE (POUR BTL) OPTIME
TOPICAL | Status: DC | PRN
  Administered 2020-01-11: 1000 mL

## 2020-01-11 MED ORDER — POLYETHYLENE GLYCOL 3350 17 G PO PACK
17.0000 g | PACK | Freq: Every day | ORAL | Status: DC
  Filled 2020-01-11: qty 1

## 2020-01-11 MED ORDER — CHLORHEXIDINE GLUCONATE CLOTH 2 % EX PADS: 6.0000 | MEDICATED_PAD | Freq: Every day | CUTANEOUS | Status: DC

## 2020-01-11 MED ORDER — ONDANSETRON HCL 4 MG/2ML IJ SOLN
INTRAMUSCULAR | Status: DC | PRN
  Administered 2020-01-11: 4 mg via INTRAVENOUS

## 2020-01-11 MED ORDER — LIDOCAINE 2% (20 MG/ML) 5 ML SYRINGE
INTRAMUSCULAR | Status: DC | PRN
  Administered 2020-01-11: 100 mg via INTRAVENOUS

## 2020-01-11 MED ORDER — EPHEDRINE SULFATE-NACL 50-0.9 MG/10ML-% IV SOSY
PREFILLED_SYRINGE | INTRAVENOUS | Status: DC | PRN
  Administered 2020-01-11: 5 mg via INTRAVENOUS

## 2020-01-11 MED ORDER — CHLORHEXIDINE GLUCONATE 0.12 % MT SOLN: 15.0000 mL | Freq: Once | OROMUCOSAL | Status: DC

## 2020-01-11 MED ORDER — SIMETHICONE 80 MG PO CHEW: 40.0000 mg | CHEWABLE_TABLET | Freq: Four times a day (QID) | ORAL | Status: DC | PRN

## 2020-01-11 MED ORDER — PHENYLEPHRINE 40 MCG/ML (10ML) SYRINGE FOR IV PUSH (FOR BLOOD PRESSURE SUPPORT)
PREFILLED_SYRINGE | INTRAVENOUS | Status: DC | PRN
  Administered 2020-01-11 (×2): 80 ug via INTRAVENOUS

## 2020-01-11 MED ORDER — SUGAMMADEX SODIUM 200 MG/2ML IV SOLN
INTRAVENOUS | Status: DC | PRN
  Administered 2020-01-11: 200 mg via INTRAVENOUS

## 2020-01-11 MED ORDER — ORAL CARE MOUTH RINSE: 15.0000 mL | Freq: Once | OROMUCOSAL | Status: DC

## 2020-01-11 MED ORDER — MORPHINE SULFATE (PF) 2 MG/ML IV SOLN: 2.0000 mg | INTRAVENOUS | Status: DC | PRN

## 2020-01-11 MED ORDER — BUPIVACAINE HCL (PF) 0.5 % IJ SOLN
INTRAMUSCULAR | Status: AC
  Filled 2020-01-11: qty 10

## 2020-01-11 MED ORDER — LIDOCAINE 2% (20 MG/ML) 5 ML SYRINGE
INTRAMUSCULAR | Status: AC
  Filled 2020-01-11: qty 20

## 2020-01-11 MED ORDER — PROPOFOL 10 MG/ML IV BOLUS
INTRAVENOUS | Status: AC
  Filled 2020-01-11: qty 20

## 2020-01-11 MED ORDER — HYDROMORPHONE HCL 1 MG/ML IJ SOLN
0.2500 mg | INTRAMUSCULAR | Status: DC | PRN
  Administered 2020-01-11: 0.5 mg via INTRAVENOUS

## 2020-01-11 MED ORDER — OXYCODONE HCL 5 MG PO TABS
5.0000 mg | ORAL_TABLET | ORAL | Status: DC | PRN
  Administered 2020-01-11 – 2020-01-12 (×4): 10 mg via ORAL
  Filled 2020-01-11 (×4): qty 2

## 2020-01-11 MED ORDER — FENTANYL CITRATE (PF) 250 MCG/5ML IJ SOLN
INTRAMUSCULAR | Status: AC
  Filled 2020-01-11: qty 5

## 2020-01-11 MED ORDER — LACTATED RINGERS IV SOLN: INTRAVENOUS | Status: DC

## 2020-01-11 MED ORDER — BUPIVACAINE-EPINEPHRINE 0.5% -1:200000 IJ SOLN
INTRAMUSCULAR | Status: DC | PRN
  Administered 2020-01-11: 18 mL

## 2020-01-11 MED ORDER — MIDAZOLAM HCL 5 MG/5ML IJ SOLN
INTRAMUSCULAR | Status: DC | PRN
  Administered 2020-01-11: 2 mg via INTRAVENOUS

## 2020-01-11 MED ORDER — SODIUM CHLORIDE 0.9 % IR SOLN
Status: DC | PRN
  Administered 2020-01-11: 1000 mL

## 2020-01-11 MED ORDER — SUCCINYLCHOLINE CHLORIDE 200 MG/10ML IV SOSY
PREFILLED_SYRINGE | INTRAVENOUS | Status: AC
  Filled 2020-01-11: qty 30

## 2020-01-11 MED ORDER — MUPIROCIN 2 % EX OINT
1.0000 "application " | TOPICAL_OINTMENT | Freq: Two times a day (BID) | CUTANEOUS | Status: DC
  Administered 2020-01-11 – 2020-01-12 (×2): 1 via NASAL
  Filled 2020-01-11 (×2): qty 22

## 2020-01-11 MED ORDER — HYDROMORPHONE HCL 1 MG/ML IJ SOLN: 0.2500 mg | INTRAMUSCULAR | Status: DC | PRN

## 2020-01-11 MED ORDER — ROCURONIUM BROMIDE 10 MG/ML (PF) SYRINGE
PREFILLED_SYRINGE | INTRAVENOUS | Status: AC
  Filled 2020-01-11: qty 40

## 2020-01-11 MED ORDER — MIDAZOLAM HCL 2 MG/2ML IJ SOLN
INTRAMUSCULAR | Status: AC
  Filled 2020-01-11: qty 2

## 2020-01-11 MED ORDER — DIPHENHYDRAMINE HCL 50 MG/ML IJ SOLN
INTRAMUSCULAR | Status: AC
  Administered 2020-01-11: 12.5 mg via INTRAVENOUS
  Filled 2020-01-11: qty 1

## 2020-01-11 MED ORDER — ENOXAPARIN SODIUM 40 MG/0.4ML ~~LOC~~ SOLN
40.0000 mg | SUBCUTANEOUS | Status: DC
  Filled 2020-01-11: qty 0.4

## 2020-01-11 MED ORDER — DEXAMETHASONE SODIUM PHOSPHATE 10 MG/ML IJ SOLN
INTRAMUSCULAR | Status: DC | PRN
  Administered 2020-01-11: 10 mg via INTRAVENOUS

## 2020-01-11 SURGICAL SUPPLY — 37 items
APPLIER CLIP 5 13 M/L LIGAMAX5 (MISCELLANEOUS)
APPLIER CLIP ROT 10 11.4 M/L (STAPLE)
CANISTER SUCT 3000ML PPV (MISCELLANEOUS) ×3 IMPLANT
CHLORAPREP W/TINT 26 (MISCELLANEOUS) ×3 IMPLANT
CLIP APPLIE 5 13 M/L LIGAMAX5 (MISCELLANEOUS) IMPLANT
CLIP APPLIE ROT 10 11.4 M/L (STAPLE) IMPLANT
COVER SURGICAL LIGHT HANDLE (MISCELLANEOUS) ×3 IMPLANT
COVER WAND RF STERILE (DRAPES) ×3 IMPLANT
CUTTER FLEX LINEAR 45M (STAPLE) ×3 IMPLANT
DERMABOND ADVANCED (GAUZE/BANDAGES/DRESSINGS) ×2
DERMABOND ADVANCED .7 DNX12 (GAUZE/BANDAGES/DRESSINGS) ×1 IMPLANT
ELECT REM PT RETURN 9FT ADLT (ELECTROSURGICAL) ×3
ELECTRODE REM PT RTRN 9FT ADLT (ELECTROSURGICAL) ×1 IMPLANT
GLOVE SURG SIGNA 7.5 PF LTX (GLOVE) ×3 IMPLANT
GOWN STRL REUS W/ TWL LRG LVL3 (GOWN DISPOSABLE) ×2 IMPLANT
GOWN STRL REUS W/ TWL XL LVL3 (GOWN DISPOSABLE) ×1 IMPLANT
GOWN STRL REUS W/TWL LRG LVL3 (GOWN DISPOSABLE) ×6
GOWN STRL REUS W/TWL XL LVL3 (GOWN DISPOSABLE) ×3
KIT BASIN OR (CUSTOM PROCEDURE TRAY) ×3 IMPLANT
KIT TURNOVER KIT B (KITS) ×3 IMPLANT
NS IRRIG 1000ML POUR BTL (IV SOLUTION) ×3 IMPLANT
PAD ARMBOARD 7.5X6 YLW CONV (MISCELLANEOUS) ×6 IMPLANT
POUCH SPECIMEN RETRIEVAL 10MM (ENDOMECHANICALS) ×3 IMPLANT
RELOAD 45 VASCULAR/THIN (ENDOMECHANICALS) IMPLANT
RELOAD STAPLE TA45 3.5 REG BLU (ENDOMECHANICALS) ×3 IMPLANT
SET IRRIG TUBING LAPAROSCOPIC (IRRIGATION / IRRIGATOR) ×3 IMPLANT
SET TUBE SMOKE EVAC HIGH FLOW (TUBING) ×3 IMPLANT
SHEARS HARMONIC ACE PLUS 36CM (ENDOMECHANICALS) ×3 IMPLANT
SLEEVE ENDOPATH XCEL 5M (ENDOMECHANICALS) ×3 IMPLANT
SPECIMEN JAR SMALL (MISCELLANEOUS) ×3 IMPLANT
SUT MON AB 4-0 PC3 18 (SUTURE) ×3 IMPLANT
TOWEL GREEN STERILE (TOWEL DISPOSABLE) ×3 IMPLANT
TOWEL GREEN STERILE FF (TOWEL DISPOSABLE) ×3 IMPLANT
TRAY LAPAROSCOPIC MC (CUSTOM PROCEDURE TRAY) ×3 IMPLANT
TROCAR XCEL BLUNT TIP 100MML (ENDOMECHANICALS) ×3 IMPLANT
TROCAR XCEL NON-BLD 5MMX100MML (ENDOMECHANICALS) ×3 IMPLANT
WATER STERILE IRR 1000ML POUR (IV SOLUTION) ×3 IMPLANT

## 2020-01-11 NOTE — Progress Notes (Addendum)
   Subjective:  [name] reports that he is still having RLQ abdominal pain. He describes the pain as a soreness, and reports that the pain has not worsened or improved. He also reports that he had 10 BMs last night. He denies any fevers chills  nausea or vomiting. General Surgery is spoken with him and planned for surgery today.  Objective:  Vital signs in last 24 hours: Vitals:   01/10/20 2000 01/10/20 2208 01/11/20 0158 01/11/20 0540  BP: 110/73 102/89 100/74 (!) 119/96  Pulse: 63 (!) 55 (!) 55 61  Resp: 16 16 16 16   Temp:  99.3 F (37.4 C) 98.6 F (37 C) 98.3 F (36.8 C)  TempSrc:  Oral Oral Oral  SpO2: 97% 100% 99% 98%  Weight:      Height:        Physical Exam Constitutional:      General: He is not in acute distress.    Appearance: He is well-developed.  HENT:     Head: Normocephalic and atraumatic.  Pulmonary:     Effort: Pulmonary effort is normal.  Abdominal:     General: Abdomen is flat. There is no distension.     Palpations: Abdomen is soft.     Tenderness: There is abdominal tenderness in the right lower quadrant.  Neurological:     Mental Status: He is alert and oriented to person, place, and time.  Psychiatric:        Mood and Affect: Mood normal.        Behavior: Behavior normal.       Assessment/Plan:  Active Problems:   Human immunodeficiency virus (HIV) disease (HCC)   Foot pain, left   Appendicitis  Jeremiah [name] is 49 yo male with medical history of HIV(on Biktarvy), HLD,  anal cancer, Hyperlipidemia, COPD that presented for 2 day history of RLQ abdominal pain, and sent to [facility] from Internal Medicine clinic for further evaluation for acute appendicitis.    Appendicitis Patient presented with 2 history of abdominal pain that started in lower abdomen and radiated to RLQ. Mildly immunosuppressed person due He does not report any fevers, chills, nausea, vomiting or diarrhea. He remained stable overnight. Physical exam is still significant  for RLQ TTP. With no improvement overnight after bowel regimen and multiple bowel movements, General Surgery will proceed with a Lap. Appendectomy today. We will continue to monitor him post-surgery and trends labs. -- Ceftriaxone and metronidazole for now. Can likely discontinue a day after the surgery if he is doing well. -- monitor physical exam --CBC    Tinea pedis  --continue Clotrimazole 1% cream   Constipation (resolved) Patient was given Senokot and Miralax BID and successfully had multiple bowel movements overnight and this morning.    HIV  Patient is currently taking Biktary He reports adherence to meds and sees Dr. Megan [name]. We will continue his home meds. Of note, WBC was decreased at 2.8 from 3.5 on yesterday's labs. Chart review shows fluctuations over the last month.His CD4 count is 187 and decreased from 324, 6 months ago. We are also checking viral load. We will continue monitoring his physical exam, vitals, and labs. -- Continue Biktarvy --CD4 pending  --CBC      LOS: 0 days   Jeremiah [name], Medical Student 01/11/2020, 10:26 AM

## 2020-01-11 NOTE — Anesthesia Preprocedure Evaluation (Addendum)
Anesthesia Evaluation  Patient identified by MRN, date of birth, ID band  Reviewed: Allergy & Precautions, NPO status , Patient's Chart, lab work & pertinent test results  Airway Mallampati: I  TM Distance: >3 FB Neck ROM: Full    Dental  (+) Edentulous Upper, Poor Dentition   Pulmonary COPD, Current Smoker,    Pulmonary exam normal breath sounds clear to auscultation       Cardiovascular hypertension, Normal cardiovascular exam Rhythm:Regular Rate:Normal     Neuro/Psych PSYCHIATRIC DISORDERS Anxiety Depression  Neuromuscular disease    GI/Hepatic negative GI ROS, Neg liver ROS,   Endo/Other  negative endocrine ROS  Renal/GU negative Renal ROS     Musculoskeletal negative musculoskeletal ROS (+)   Abdominal Normal abdominal exam  (+)   Peds  Hematology negative hematology ROS (+)   Anesthesia Other Findings HIV anal cancer  Reproductive/Obstetrics negative OB ROS                            Anesthesia Physical Anesthesia Plan  ASA: III and emergent  Anesthesia Plan: General   Post-op Pain Management:    Induction: Intravenous  PONV Risk Score and Plan: 1 and Dexamethasone, Ondansetron and Midazolam  Airway Management Planned: Oral ETT  Additional Equipment:   Intra-op Plan:   Post-operative Plan: Extubation in OR  Informed Consent: I have reviewed the patients History and Physical, chart, labs and discussed the procedure including the risks, benefits and alternatives for the proposed anesthesia with the patient or authorized representative who has indicated his/her understanding and acceptance.     Dental advisory given  Plan Discussed with:   Anesthesia Plan Comments:         Anesthesia Quick Evaluation

## 2020-01-11 NOTE — Discharge Instructions (Signed)
[facility] LAPAROSCOPIC SURGERY: POST OP INSTRUCTIONS Always review your discharge instruction sheet given to you by the facility where your surgery was performed. IF YOU HAVE DISABILITY OR FAMILY LEAVE FORMS, YOU MUST BRING THEM TO THE OFFICE FOR PROCESSING.   DO NOT GIVE THEM TO YOUR DOCTOR.  PAIN CONTROL  1. First take acetaminophen (Tylenol) AND/or ibuprofen (Advil) to control your pain after surgery.  Follow directions on package.  Taking acetaminophen (Tylenol) and/or ibuprofen (Advil) regularly after surgery will help to control your pain and lower the amount of prescription pain medication you may need.  You should not take more than 3,000 mg (3 grams) of acetaminophen (Tylenol) in 24 hours.  You should not take ibuprofen (Advil), aleve, motrin, naprosyn or other NSAIDS if you have a history of stomach ulcers or chronic kidney disease.  2. A prescription for pain medication may be given to you upon discharge.  Take your pain medication as prescribed, if you still have uncontrolled pain after taking acetaminophen (Tylenol) or ibuprofen (Advil). 3. Use ice packs to help control pain. 4. If you need a refill on your pain medication, please contact your pharmacy.  They will contact our office to request authorization. Prescriptions will not be filled after 5pm or on week-ends.  HOME MEDICATIONS 5. Take your usually prescribed medications unless otherwise directed.  DIET 6. You should follow a light diet the first few days after arrival home.  Be sure to include lots of fluids daily. Avoid fatty, fried foods.   CONSTIPATION 7. It is common to experience some constipation after surgery and if you are taking pain medication.  Increasing fluid intake and taking a stool softener (such as Colace) will usually help or prevent this problem from occurring.  A mild laxative (Milk of Magnesia or Miralax) should be taken according to package instructions if there are no bowel  movements after 48 hours.  WOUND/INCISION CARE 8. Most patients will experience some swelling and bruising in the area of the incisions.  Ice packs will help.  Swelling and bruising can take several days to resolve.  9. Unless discharge instructions indicate otherwise, follow guidelines below  a. STERI-STRIPS - you may remove your outer bandages 48 hours after surgery, and you may shower at that time.  You have steri-strips (small skin tapes) in place directly over the incision.  These strips should be left on the skin for 7-10 days.   b. DERMABOND/SKIN GLUE - you may shower in 24 hours.  The glue will flake off over the next 2-3 weeks. 10. Any sutures or staples will be removed at the office during your follow-up visit.  ACTIVITIES 11. You may resume regular (light) daily activities beginning the next day--such as daily self-care, walking, climbing stairs--gradually increasing activities as tolerated.  You may have sexual intercourse when it is comfortable.  Refrain from any heavy lifting or straining until approved by your doctor. a. You may drive when you are no longer taking prescription pain medication, you can comfortably wear a seatbelt, and you can safely maneuver your car and apply brakes.  FOLLOW-UP 12. You should see your doctor in the office for a follow-up appointment approximately 2-3 weeks after your surgery.  You should have been given your post-op/follow-up appointment when your surgery was scheduled.  If you did not receive a post-op/follow-up appointment, make sure that you call for this appointment within a day or two after you arrive home to insure a convenient appointment time.     WHEN TO CALL YOUR DOCTOR: 1. Fever over 101.0 2. Inability to urinate 3. Continued bleeding from incision. 4. Increased pain, redness, or drainage from the incision. 5. Increasing abdominal pain  The clinic staff is available to answer your questions during regular business hours.  Please don't  hesitate to call and ask to speak to one of the nurses for clinical concerns.  If you have a medical emergency, go to the nearest emergency room or call 911.  A surgeon from [facility] is always on call at the hospital. [address], [address], [place], [place]  [address] ? [address] [address], [place], [place]   [address] [phone] ? [phone] ? FAX [phone] Web site: [email or web address]  .........   Managing Your Pain After Surgery Without Opioids    Thank you for participating in our program to help patients manage their pain after surgery without opioids. This is part of our effort to provide you with the best care possible, without exposing you or your family to the risk that opioids pose.  What pain can I expect after surgery? You can expect to have some pain after surgery. This is normal. The pain is typically worse the day after surgery, and quickly begins to get better. Many studies have found that many patients are able to manage their pain after surgery with Over-the-Counter (OTC) medications such as Tylenol and Motrin. If you have a condition that does not allow you to take Tylenol or Motrin, notify your surgical team.  How will I manage my pain? The best strategy for controlling your pain after surgery is around the clock pain control with Tylenol (acetaminophen) and Motrin (ibuprofen or Advil). Alternating these medications with each other allows you to maximize your pain control. In addition to Tylenol and Motrin, you can use heating pads or ice packs on your incisions to help reduce your pain.  How will I alternate your regular strength over-the-counter pain medication? You will take a dose of pain medication every three hours. ; Start by taking 650 mg of Tylenol (2 pills of 325 mg) ; 3 hours later take 600 mg of Motrin (3 pills of 200 mg) ; 3 hours after taking the Motrin take 650 mg of Tylenol ; 3 hours after that take 600 mg of  Motrin.   - 1 -  See example - if your first dose of Tylenol is at 12:00 PM   12:00 PM Tylenol 650 mg (2 pills of 325 mg)  3:00 PM Motrin 600 mg (3 pills of 200 mg)  6:00 PM Tylenol 650 mg (2 pills of 325 mg)  9:00 PM Motrin 600 mg (3 pills of 200 mg)  Continue alternating every 3 hours   We recommend that you follow this schedule around-the-clock for at least 3 days after surgery, or until you feel that it is no longer needed. Use the table on the last page of this handout to keep track of the medications you are taking. Important: Do not take more than 3000mg of Tylenol or 3200mg of Motrin in a 24-hour period. Do not take ibuprofen/Motrin if you have a history of bleeding stomach ulcers, severe kidney disease, &/or actively taking a blood thinner  What if I still have pain? If you have pain that is not controlled with the over-the-counter pain medications (Tylenol and Motrin or Advil) you might have what we call "breakthrough" pain. You will receive a prescription for a small amount of an opioid pain medication such as   Oxycodone, Tramadol, or Tylenol with Codeine. Use these opioid pills in the first 24 hours after surgery if you have breakthrough pain. Do not take more than 1 pill every 4-6 hours.  If you still have uncontrolled pain after using all opioid pills, don't hesitate to call our staff using the number provided. We will help make sure you are managing your pain in the best way possible, and if necessary, we can provide a prescription for additional pain medication.   Day 1    Time  Name of Medication Number of pills taken  Amount of Acetaminophen  Pain Level   Comments  AM PM       AM PM       AM PM       AM PM       AM PM       AM PM       AM PM       AM PM       Total Daily amount of Acetaminophen Do not take more than  3,000 mg per day      Day 2    Time  Name of Medication Number of pills taken  Amount of Acetaminophen  Pain Level   Comments  AM  PM       AM PM       AM PM       AM PM       AM PM       AM PM       AM PM       AM PM       Total Daily amount of Acetaminophen Do not take more than  3,000 mg per day      Day 3    Time  Name of Medication Number of pills taken  Amount of Acetaminophen  Pain Level   Comments  AM PM       AM PM       AM PM       AM PM          AM PM       AM PM       AM PM       AM PM       Total Daily amount of Acetaminophen Do not take more than  3,000 mg per day      Day 4    Time  Name of Medication Number of pills taken  Amount of Acetaminophen  Pain Level   Comments  AM PM       AM PM       AM PM       AM PM       AM PM       AM PM       AM PM       AM PM       Total Daily amount of Acetaminophen Do not take more than  3,000 mg per day      Day 5    Time  Name of Medication Number of pills taken  Amount of Acetaminophen  Pain Level   Comments  AM PM       AM PM       AM PM       AM PM       AM PM       AM PM       AM PM         AM PM       Total Daily amount of Acetaminophen Do not take more than  3,000 mg per day       Day 6    Time  Name of Medication Number of pills taken  Amount of Acetaminophen  Pain Level  Comments  AM PM       AM PM       AM PM       AM PM       AM PM       AM PM       AM PM       AM PM       Total Daily amount of Acetaminophen Do not take more than  3,000 mg per day      Day 7    Time  Name of Medication Number of pills taken  Amount of Acetaminophen  Pain Level   Comments  AM PM       AM PM       AM PM       AM PM       AM PM       AM PM       AM PM       AM PM       Total Daily amount of Acetaminophen Do not take more than  3,000 mg per day        For additional information about how and where to safely dispose of unused opioid medications - https://www.morepowerfulnc.org  Disclaimer: This document contains information and/or instructional materials adapted from [facility]  for the typical patient with your condition. It does not replace medical advice from your health care provider because your experience may differ from that of the typical patient. Talk to your health care provider if you have any questions about this document, your condition or your treatment plan. Adapted from [facility]  

## 2020-01-11 NOTE — Progress Notes (Signed)
Pt arrived back from PACU via bed.

## 2020-01-11 NOTE — Transfer of Care (Signed)
Immediate Anesthesia Transfer of Care Note  Patient: Jeremiah [name]  Procedure(s) Performed: APPENDECTOMY LAPAROSCOPIC (N/A Abdomen)  Patient Location: PACU  Anesthesia Type:General  Level of Consciousness: awake, alert  and oriented  Airway & Oxygen Therapy: Patient Spontanous Breathing  Post-op Assessment: Report given to RN and Post -op Vital signs reviewed and stable  Post vital signs: Reviewed and stable  Last Vitals:  Vitals Value Taken Time  BP 157/99   Temp    Pulse 56   Resp 12   SpO2 98     Last Pain:  Vitals:   01/11/20 1319  TempSrc:   PainSc: 7       Patients Stated Pain Goal: 4 (12/37/99 0940)  Complications: No complications documented.

## 2020-01-11 NOTE — Progress Notes (Signed)
[facility] Progress Note     Subjective: CC:  Reports ongoing abdominal pain and soreness, described as the same or slightly worse compared to yesterday. Points to the area between his umbilicus and his ASIS as the area causing him the most pain. Had multiple BMs overnight/this AM after enema. Denies fever, chills, nausea, vomiting.   Objective: Vital signs in last 24 hours: Temp:  [98.3 F (36.8 C)-99.3 F (37.4 C)] 98.3 F (36.8 C) (09/22 0540) Pulse Rate:  [55-82] 61 (09/22 0540) Resp:  [15-17] 16 (09/22 0540) BP: (100-140)/(73-99) 119/96 (09/22 0540) SpO2:  [97 %-100 %] 98 % (09/22 0540) Last BM Date: 01/10/20  Intake/Output from previous day: No intake/output data recorded. Intake/Output this shift: No intake/output data recorded.  PE: Gen:  Alert, NAD, pleasant Card:  Regular rate and rhythm, pedal pulses 2+ BL Pulm:  Right chest wall with surgical scar that appears well-healing, Normal effort, clear to auscultation bilaterally Abd: Soft, +BS, mild TTP RLQ and periumbilical region without guarding or peritonitis. Skin: warm and dry, no rashes  Psych: A&Ox3   Lab Results:  Recent Labs    01/10/20 1328 01/11/20 0607  WBC 3.5* 2.8*  HGB 14.9 13.5  HCT 42.4 38.4*  PLT 150 135*   BMET Recent Labs    01/10/20 0223 01/10/20 0223 01/10/20 1328 01/11/20 0607  NA 137  --   --  138  K 4.4  --   --  4.4  CL 101  --   --  104  CO2 28  --   --  29  GLUCOSE 123*  --   --  91  BUN 12  --   --  5*  CREATININE 1.28*   < > 1.11 1.03  CALCIUM 9.6  --   --  9.0   < > = values in this interval not displayed.   CMP     Component Value Date/Time   NA 138 01/11/2020 0607   NA 139 03/24/2019 1328   K 4.4 01/11/2020 0607   CL 104 01/11/2020 0607   CO2 29 01/11/2020 0607   GLUCOSE 91 01/11/2020 0607   BUN 5 (L) 01/11/2020 0607   BUN 17 03/24/2019 1328   CREATININE 1.03 01/11/2020 0607   CREATININE 1.32 06/20/2019 1026   CALCIUM 9.0 01/11/2020 0607    PROT 7.0 01/10/2020 0223   ALBUMIN 4.4 01/10/2020 0223   AST 91 (H) 01/10/2020 0223   AST 41 06/18/2018 1114   ALT 42 01/10/2020 0223   ALT 35 06/18/2018 1114   ALKPHOS 51 01/10/2020 0223   BILITOT 1.6 (H) 01/10/2020 0223   BILITOT 1.0 06/18/2018 1114   GFRNONAA >60 01/11/2020 0607   GFRNONAA 63 06/20/2019 1026   GFRAA >60 01/11/2020 0607   GFRAA 73 06/20/2019 1026   Lipase     Component Value Date/Time   LIPASE 44 01/10/2020 1030   Studies/Results: CT Abdomen Pelvis W Contrast  Result Date: 01/10/2020 CLINICAL DATA:  Lower abdominal pain EXAM: CT ABDOMEN AND PELVIS WITH CONTRAST TECHNIQUE: Multidetector CT imaging of the abdomen and pelvis was performed using the standard protocol following bolus administration of intravenous contrast. CONTRAST:  158mL OMNIPAQUE IOHEXOL 300 MG/ML  SOLN COMPARISON:  May 22, 2018 FINDINGS: Lower chest: There is scarring in the lung bases. There is no lung base edema or airspace opacity. Hepatobiliary: There is hepatic steatosis. No focal liver lesions are appreciable. Gallbladder wall is not appreciably thickened. There is no biliary duct dilatation. Pancreas: No pancreatic  mass or inflammatory focus. Spleen: No splenic lesions are evident. Adrenals/Urinary Tract: Adrenals bilaterally appear unremarkable. There is a 5 x 4 mm probable cyst along the lateral mid left kidney. There is no evident hydronephrosis on either side. There is no evident renal or ureteral calculus on either side. Urinary bladder is midline with wall thickness within normal limits. Stomach/Bowel: There is no appreciable bowel wall or mesenteric thickening. The terminal ileum appears normal. There is no bowel obstruction. There is no free air or portal venous air. Vascular/Lymphatic: No abdominal aortic aneurysm. No arterial vascular lesions are evident. Major venous structures appear patent. There is no evident adenopathy in the abdomen or pelvis. Reproductive: Prostate and seminal  vesicles are normal in size and contour. No evident pelvic mass. Other: The appendix is distended, measuring up to 12 mm in length. There is equivocal enhancement along portions of the wall of the appendix. There is no appendicolith evident. There is no associated abscess or perforation appreciable. No abscess or ascites evident in the abdomen or pelvis. Musculoskeletal: No blastic or lytic bone lesions. No intramuscular or abdominal wall lesions are evident. IMPRESSION: 1.  Findings indicative of acute appendicitis. Appendix: Location: Appendix arises inferomedially from the cecum with the appendix in the right pelvis at the mid iliac crest level. Diameter: Up to 12 mm Appendicolith: None Mucosal hyper-enhancement: Mild Extraluminal gas: None Periappendiceal collection: None 2. No bowel wall thickening or bowel obstruction. No abscess in the abdomen or pelvis. 3. No renal or ureteral calculus. No hydronephrosis. Urinary bladder wall thickness normal. 4.  Hepatic steatosis. Critical Value/emergent results were called by telephone at the time of interpretation on 01/10/2020 at 10:05 am to provider Mali Grose, RN, who verbally acknowledged these results. Electronically Signed   By: Lowella Grip III M.D.   On: 01/10/2020 10:09   DG Toe 5th Left  Result Date: 01/10/2020 CLINICAL DATA:  Pain at base of 5th toe.  Skin infection. EXAM: DG TOE 5TH LEFT COMPARISON:  05/26/2019 FINDINGS: Deformity of the distal phalanx of the left 5th toe is unchanged since prior study. This could be developmental or related to old injury or infection. No bone destruction to suggest acute osteomyelitis. No fracture, subluxation or dislocation. IMPRESSION: No acute bony abnormality. Electronically Signed   By: Rolm Baptise M.D.   On: 01/10/2020 09:09    Anti-infectives: Anti-infectives (From admission, onward)   Start     Dose/Rate Route Frequency Ordered Stop   01/11/20 1100  cefTRIAXone (ROCEPHIN) 2 g in sodium chloride 0.9 %  100 mL IVPB        2 g 200 mL/hr over 30 Minutes Intravenous Every 24 hours 01/10/20 1649 01/16/20 1059   01/10/20 2100  metroNIDAZOLE (FLAGYL) IVPB 500 mg        500 mg 100 mL/hr over 60 Minutes Intravenous Every 8 hours 01/10/20 1649 01/15/20 2059   01/10/20 1345  bictegravir-emtricitabine-tenofovir AF (BIKTARVY) 50-200-25 MG per tablet 1 tablet        1 tablet Oral Daily 01/10/20 1335     01/10/20 1100  cefTRIAXone (ROCEPHIN) 2 g in sodium chloride 0.9 % 100 mL IVPB       "And" Linked Group Details   2 g 200 mL/hr over 30 Minutes Intravenous  Once 01/10/20 1045 01/10/20 1236   01/10/20 1100  metroNIDAZOLE (FLAGYL) IVPB 500 mg       "And" Linked Group Details   500 mg 100 mL/hr over 60 Minutes Intravenous  Once 01/10/20 1045 01/10/20 1408  Assessment/Plan Hx of anal cancer s/p chemo and radiation  HIV - in [place]  COPD HTN  Abdominal pain Early appendicitis vs severe constipation - afebrile, VSS, WBC 2, non-toxic appearing - CT shows enlarged appendix 12 mm but no periappendiceal stranding or appendicolith, no sig of perforation or abscess; CT also shows large stool burden throughout colon, although this is not noted in report - clinically the patient is having more pain than yesterday, having multiple BMs did not help his pain.  - recommend laparoscopic appendectomy today. The risks of surgery including bleeding, infection, damage to surrounding structures, conversion to open, MI, CVA, and respiratory complications were discussed with the patient and he is willing to proceed with surgery.    FEN: NPO, sips with meds ID: Rocephin/Flagyl 9/21 >> VTE: SCD's, will hold Lovenox today for OR. Foley: none Dispo: med-surg, OR today for lap appy    LOS: 0 days    Jeremiah [name], [facility] Please see Amion for pager number during day hours 7:00am-4:30pm

## 2020-01-11 NOTE — Anesthesia Procedure Notes (Signed)
Procedure Name: Intubation Date/Time: 01/11/2020 3:33 PM Performed by: Candis Shine, CRNA Pre-anesthesia Checklist: Patient identified, Emergency Drugs available, Suction available and Patient being monitored Patient Re-evaluated:Patient Re-evaluated prior to induction Oxygen Delivery Method: Circle System Utilized Preoxygenation: Pre-oxygenation with 100% oxygen Induction Type: IV induction Ventilation: Mask ventilation without difficulty Laryngoscope Size: Mac and 4 Grade View: Grade I Tube type: Oral Tube size: 7.5 mm Number of attempts: 1 Airway Equipment and Method: Stylet Placement Confirmation: ETT inserted through vocal cords under direct vision,  positive ETCO2 and breath sounds checked- equal and bilateral Secured at: 22 cm Tube secured with: Tape Dental Injury: Teeth and Oropharynx as per pre-operative assessment

## 2020-01-11 NOTE — Op Note (Signed)
DATE OF PROCEDURE: 01/11/2020  PREOPERATIVE DIAGNOSIS: Acute appendicitis   POSTOPERATIVE DIAGNOSIS: Acute appendicitis   PROCEDURE PERFORMED: Laparoscopic appendectomy   SURGEONS: Coralie Keens, MD Daiva Huge, MD (resident)   ANESTHESIA: General   INDICATIONS: The patient is a 49 y.o. Male presented with acute uncomplicated appendicitis   PROCEDURE IN DETAIL:   The patient was seen again in the Holding Room. The risks, benefits, complications, treatment options, and expected outcomes were discussed with the patient and/or family. The possibilities of reaction to medication, perforation of viscus, bleeding, recurrent infection, finding a normal appendix, the need for additional procedures, failure to diagnose a condition, and creating a complication requiring transfusion or operation were discussed. There was concurrence with the proposed plan and informed consent was obtained. The site of surgery was properly noted. The patient was taken to Operating Room, identified as Jeremiah [name] and the procedure verified as laparoscopic appendectomy. A Time Out was held and the above information confirmed.  The patient was placed in the supine position and general anesthesia was induced. The abdomen was prepped and draped in a sterile fashion. A one centimeter infraumbilical incision was made.  The umbilical stalk was elevated, and the midline fascia was incised with a #11 blade.  A Kelly clamp was used to confirm entrance into the peritoneal cavity.  A pursestring suture was passed around the incision with a 0 Vicryl.  The Hasson was introduced into the abdomen and the tails of the suture were used to hold the Hasson in place.   The pneumoperitoneum was then established to steady pressure of 15 mmHg.  Additional 5 mm cannulas then placed in the left lower quadrant of the abdomen and the suprapubic region under direct visualization. A careful evaluation of the entire abdomen was  carried out. The patient was placed in Trendelenburg and left lateral decubitus position. The small intestines were retracted in the cephalad and left lateral direction away from the pelvis and right lower quadrant. The patient was found to have an enlarged and inflamed appendix that was extending into the pelvis. There was no evidence of perforation.  The appendix was carefully dissected. The appendix was was skeletonized with the harmonic scalpel.   The appendix was divided at its base using an endo-GIA stapler. Minimal appendiceal stump was left in place. There was no evidence of bleeding, leakage, or complication after division of the appendix. Irrigation was also performed and irrigate suctioned from the abdomen as well.  The umbilical port site was closed with the purse string suture. There was no residual palpable fascial defect.  The trocar site skin wounds were closed with 4-0 Monocryl.  Instrument, sponge, and needle counts were correct at the conclusion of the case.    ESTIMATED BLOOD LOSS: 5cc  SPECIMENS: Appendix  COMPLICATIONS: None

## 2020-01-11 NOTE — Plan of Care (Signed)
  Problem: Education: Goal: Knowledge of General Education information will improve Description Including pain rating scale, medication(s)/side effects and non-pharmacologic comfort measures Outcome: Progressing   

## 2020-01-12 ENCOUNTER — Encounter (HOSPITAL_COMMUNITY): Payer: Self-pay | Admitting: Surgery

## 2020-01-12 LAB — CBC
HCT: 39.6 % (ref 39.0–52.0)
Hemoglobin: 13.9 g/dL (ref 13.0–17.0)
MCH: 37.4 pg — ABNORMAL HIGH (ref 26.0–34.0)
MCHC: 35.1 g/dL (ref 30.0–36.0)
MCV: 106.5 fL — ABNORMAL HIGH (ref 80.0–100.0)
Platelets: 145 10*3/uL — ABNORMAL LOW (ref 150–400)
RBC: 3.72 MIL/uL — ABNORMAL LOW (ref 4.22–5.81)
RDW: 11.6 % (ref 11.5–15.5)
WBC: 6.3 10*3/uL (ref 4.0–10.5)
nRBC: 0 % (ref 0.0–0.2)

## 2020-01-12 LAB — BASIC METABOLIC PANEL
Anion gap: 11 (ref 5–15)
BUN: 7 mg/dL (ref 6–20)
CO2: 26 mmol/L (ref 22–32)
Calcium: 9.3 mg/dL (ref 8.9–10.3)
Chloride: 103 mmol/L (ref 98–111)
Creatinine, Ser: 0.94 mg/dL (ref 0.61–1.24)
GFR calc Af Amer: >60 mL/min (ref >60)
GFR calc non Af Amer: >60 mL/min (ref >60)
Glucose, Bld: 148 mg/dL — ABNORMAL HIGH (ref 70–99)
Potassium: 4.9 mmol/L (ref 3.5–5.1)
Sodium: 140 mmol/L (ref 135–145)

## 2020-01-12 MED ORDER — OXYCODONE-ACETAMINOPHEN 5-325 MG PO TABS: 1.0000 | ORAL_TABLET | Freq: Four times a day (QID) | ORAL | 0 refills | Status: AC | PRN

## 2020-01-12 MED ORDER — SENNA 8.6 MG PO TABS: 2.0000 | ORAL_TABLET | Freq: Two times a day (BID) | ORAL | 0 refills | Status: AC

## 2020-01-12 NOTE — Progress Notes (Signed)
Subjective: Jeremiah [name] reports that he is doing well this morning. He has some mild abdominal soreness from his surgery. He is eating and drinking well. He has had a couple of bowel movements overnight that were a liquid consistent. He denies any fevers, chills or nausea/vomiting.    Objective:  Vital signs in last 24 hours: Vitals:   01/11/20 1734 01/11/20 2147 01/12/20 0249 01/12/20 0553  BP: (!) 143/99 123/85 117/79 110/75  Pulse: (!) 58 60 (!) 55 (!) 57  Resp: 18 16 17 17   Temp: 97.6 F (36.4 C) 98.3 F (36.8 C) 97.7 F (36.5 C) (!) 97.4 F (36.3 C)  TempSrc: Oral Oral Oral Oral  SpO2: 100% 100% 98% 100%  Weight:      Height:       Physical Exam Constitutional:      General: He is not in acute distress.    Appearance: He is well-developed.  HENT:     Head: Normocephalic and atraumatic.  Cardiovascular:     Rate and Rhythm: Normal rate and regular rhythm.     Heart sounds: Normal heart sounds.  Pulmonary:     Effort: Pulmonary effort is normal.     Breath sounds: Normal breath sounds.  Abdominal:     General: Abdomen is flat. A surgical scar is present. Bowel sounds are normal.     Palpations: Abdomen is soft.     Comments: 3 well-healing 2cm surgical scars with Dermabond applied. Mild tenderness to palpation.  Skin:    General: Skin is warm and dry.  Neurological:     Mental Status: He is alert.  Psychiatric:        Mood and Affect: Mood normal.      Assessment/Plan:  Principal Problem:   Appendicitis Active Problems:   Human immunodeficiency virus (HIV) disease (HCC)   Foot pain, left  Jeremiah [name] is 49 yo male with medical history of HIV(on Biktarvy), HLD, anal cancer, Hyperlipidemia, COPD that presented for 2 day history of RLQ abdominal pain consistent with Acute Appendicitis. He was admitted on 01/11/20 and 1 day s/p Lap. Appendectomy.  Appendicitis s/p Lap. Appendectomy 1 day s/p Laparoscopic Appendectomy. Patients is subjectively doing  well, with minimal pain. Physical exam is unremarkable except for mild abdominal tenderness. He should follow General Surgery's post-operative recommendations and follow up with his Surgeon and PCP. We will discharge him with 3 days of Percocet for pain management as needed and Senokot for any opioid associated constipation. --d/c Cetriaxone and Metronidazole  --Oxycodone-Acetaminophen 5-325mg  q6h PRN --Senokot 8.6mg  BID   Tinea pedis Continue applying antifungal cream and follow up with PCP. --clotrimazole 1% cream  HIV  Patient is currently taking Biktary. He reports adherence to meds and sees Dr. Megan Salon. We will continue his home meds of Biktarvy. On admission, CD4 was reduced to 187 from 324 (03/21). HIV viral load was undetectable. WBC has increased from 2.8 to 6.5. The decrease in CD4 could be a consequence of recent infection. We did not start prophylactic antibiotics at this time. Dr. Megan Salon will follow up with him in 4 weeks. --continue Biktarvy --follow up with Dr. Megan Salon in 4 weeks  Hypertension He was previously prescribed HCTZ 25mg . We held his home medication and his BP has been normotensive during his admission. We held at discharge and he should follow up with PCP for BP management.  --continue to hold HCTZ --follow up with PCP  Alcohol Use Patient reports that he drinks 1.5 pints of Liquor daily. He  has not experienced cravings or withdrawal. CT A/P showed evidence of Liver Steatosis. We discussed reducing alcohol intake and is amenable to moderation.  He may benefit from further education in outpatient setting.    LOS: 1 day   Jacklynn Bue, Medical Student 01/12/2020, 9:40 AM

## 2020-01-12 NOTE — Progress Notes (Signed)
[facility] Progress Note  1 Day Post-Op  Subjective: CC:  NAEO. Pain controlled.   Objective: Vital signs in last 24 hours: Temp:  [97 F (36.1 C)-98.3 F (36.8 C)] 97.4 F (36.3 C) (09/23 0553) Pulse Rate:  [55-65] 57 (09/23 0553) Resp:  [15-112] 17 (09/23 0553) BP: (110-157)/(75-99) 110/75 (09/23 0553) SpO2:  [95 %-100 %] 100 % (09/23 0553) Weight:  [68.5 kg] 68.5 kg (09/22 1313) Last BM Date: 01/11/20  Intake/Output from previous day: 09/22 0701 - 09/23 0700 In: 986.3 [P.O.:120; I.V.:866.3] Out: 3 [Blood:3] Intake/Output this shift: No intake/output data recorded.  PE: Gen:  Alert, NAD, pleasant Card:  Regular rate and rhythm, pedal pulses 2+ BL Pulm:  Normal effort, clear to auscultation bilaterally Abd: Soft, appropriately tender, non-distended, bowel sounds present in all 4 quadrants, incisions C/D/I Skin: warm and dry, no rashes  Psych: A&Ox3   Lab Results:  Recent Labs    01/11/20 0607 01/12/20 0222  WBC 2.8* 6.3  HGB 13.5 13.9  HCT 38.4* 39.6  PLT 135* 145*   BMET Recent Labs    01/11/20 0607 01/12/20 0222  NA 138 140  K 4.4 4.9  CL 104 103  CO2 29 26  GLUCOSE 91 148*  BUN 5* 7  CREATININE 1.03 0.94  CALCIUM 9.0 9.3   PT/INR No results for input(s): LABPROT, INR in the last 72 hours. CMP     Component Value Date/Time   NA 140 01/12/2020 0222   NA 139 03/24/2019 1328   K 4.9 01/12/2020 0222   CL 103 01/12/2020 0222   CO2 26 01/12/2020 0222   GLUCOSE 148 (H) 01/12/2020 0222   BUN 7 01/12/2020 0222   BUN 17 03/24/2019 1328   CREATININE 0.94 01/12/2020 0222   CREATININE 1.32 06/20/2019 1026   CALCIUM 9.3 01/12/2020 0222   PROT 7.0 01/10/2020 0223   ALBUMIN 4.4 01/10/2020 0223   AST 91 (H) 01/10/2020 0223   AST 41 06/18/2018 1114   ALT 42 01/10/2020 0223   ALT 35 06/18/2018 1114   ALKPHOS 51 01/10/2020 0223   BILITOT 1.6 (H) 01/10/2020 0223   BILITOT 1.0 06/18/2018 1114   GFRNONAA >60 01/12/2020 0222   GFRNONAA 63  06/20/2019 1026   GFRAA >60 01/12/2020 0222   GFRAA 73 06/20/2019 1026   Lipase     Component Value Date/Time   LIPASE 44 01/10/2020 1030       Studies/Results: CT Abdomen Pelvis W Contrast  Result Date: 01/10/2020 CLINICAL DATA:  Lower abdominal pain EXAM: CT ABDOMEN AND PELVIS WITH CONTRAST TECHNIQUE: Multidetector CT imaging of the abdomen and pelvis was performed using the standard protocol following bolus administration of intravenous contrast. CONTRAST:  151mL OMNIPAQUE IOHEXOL 300 MG/ML  SOLN COMPARISON:  May 22, 2018 FINDINGS: Lower chest: There is scarring in the lung bases. There is no lung base edema or airspace opacity. Hepatobiliary: There is hepatic steatosis. No focal liver lesions are appreciable. Gallbladder wall is not appreciably thickened. There is no biliary duct dilatation. Pancreas: No pancreatic mass or inflammatory focus. Spleen: No splenic lesions are evident. Adrenals/Urinary Tract: Adrenals bilaterally appear unremarkable. There is a 5 x 4 mm probable cyst along the lateral mid left kidney. There is no evident hydronephrosis on either side. There is no evident renal or ureteral calculus on either side. Urinary bladder is midline with wall thickness within normal limits. Stomach/Bowel: There is no appreciable bowel wall or mesenteric thickening. The terminal ileum appears normal. There is no bowel obstruction.  There is no free air or portal venous air. Vascular/Lymphatic: No abdominal aortic aneurysm. No arterial vascular lesions are evident. Major venous structures appear patent. There is no evident adenopathy in the abdomen or pelvis. Reproductive: Prostate and seminal vesicles are normal in size and contour. No evident pelvic mass. Other: The appendix is distended, measuring up to 12 mm in length. There is equivocal enhancement along portions of the wall of the appendix. There is no appendicolith evident. There is no associated abscess or perforation appreciable. No  abscess or ascites evident in the abdomen or pelvis. Musculoskeletal: No blastic or lytic bone lesions. No intramuscular or abdominal wall lesions are evident. IMPRESSION: 1.  Findings indicative of acute appendicitis. Appendix: Location: Appendix arises inferomedially from the cecum with the appendix in the right pelvis at the mid iliac crest level. Diameter: Up to 12 mm Appendicolith: None Mucosal hyper-enhancement: Mild Extraluminal gas: None Periappendiceal collection: None 2. No bowel wall thickening or bowel obstruction. No abscess in the abdomen or pelvis. 3. No renal or ureteral calculus. No hydronephrosis. Urinary bladder wall thickness normal. 4.  Hepatic steatosis. Critical Value/emergent results were called by telephone at the time of interpretation on 01/10/2020 at 10:05 am to provider Mali Grose, RN, who verbally acknowledged these results. Electronically Signed   By: Lowella Grip III M.D.   On: 01/10/2020 10:09   DG Toe 5th Left  Result Date: 01/10/2020 CLINICAL DATA:  Pain at base of 5th toe.  Skin infection. EXAM: DG TOE 5TH LEFT COMPARISON:  05/26/2019 FINDINGS: Deformity of the distal phalanx of the left 5th toe is unchanged since prior study. This could be developmental or related to old injury or infection. No bone destruction to suggest acute osteomyelitis. No fracture, subluxation or dislocation. IMPRESSION: No acute bony abnormality. Electronically Signed   By: Rolm Baptise M.D.   On: 01/10/2020 09:09    Anti-infectives: Anti-infectives (From admission, onward)   Start     Dose/Rate Route Frequency Ordered Stop   01/11/20 1100  cefTRIAXone (ROCEPHIN) 2 g in sodium chloride 0.9 % 100 mL IVPB  Status:  Discontinued        2 g 200 mL/hr over 30 Minutes Intravenous Every 24 hours 01/10/20 1649 01/11/20 1747   01/10/20 2100  metroNIDAZOLE (FLAGYL) IVPB 500 mg  Status:  Discontinued        500 mg 100 mL/hr over 60 Minutes Intravenous Every 8 hours 01/10/20 1649 01/11/20 1747    01/10/20 1345  bictegravir-emtricitabine-tenofovir AF (BIKTARVY) 50-200-25 MG per tablet 1 tablet        1 tablet Oral Daily 01/10/20 1335     01/10/20 1100  cefTRIAXone (ROCEPHIN) 2 g in sodium chloride 0.9 % 100 mL IVPB       "And" Linked Group Details   2 g 200 mL/hr over 30 Minutes Intravenous  Once 01/10/20 1045 01/10/20 1236   01/10/20 1100  metroNIDAZOLE (FLAGYL) IVPB 500 mg       "And" Linked Group Details   500 mg 100 mL/hr over 60 Minutes Intravenous  Once 01/10/20 1045 01/10/20 1408     Assessment/Plan Hx of anal cancer s/p chemo and radiation  HIV - in [place]  COPD HTN  Abdominal pain severe constipation - improved s/p enema, mag citrate Early appendicitis  POD#1 s/p laparoscopic appendectomy  AFVSS, WBC 6, H/H stable  Pain controlled, tolerating PO  FEN: Reg, saline lock IV  ID: Rocephin/Flagyl 9/21 >> VTE: SCD's, Lovenox  Foley: none  Dispo: med-surg, stable for  discharge from a surgical perspective.  Follow up: ~2 weeks in [place] clinic, this has been arranged for the patient.    LOS: 1 day    Obie Dredge, [facility] Please see Amion for pager number during day hours 7:00am-4:30pm

## 2020-01-12 NOTE — Anesthesia Postprocedure Evaluation (Signed)
Anesthesia Post Note  Patient: Jeremiah [name]  Procedure(s) Performed: APPENDECTOMY LAPAROSCOPIC (N/A Abdomen)     Patient location during evaluation: PACU Anesthesia Type: General Level of consciousness: awake and alert Pain management: pain level controlled Vital Signs Assessment: post-procedure vital signs reviewed and stable Respiratory status: spontaneous breathing, nonlabored ventilation, respiratory function stable and patient connected to nasal cannula oxygen Cardiovascular status: blood pressure returned to baseline and stable Postop Assessment: no apparent nausea or vomiting Anesthetic complications: no   No complications documented.  Last Vitals:  Vitals:   01/12/20 0249 01/12/20 0553  BP: 117/79 110/75  Pulse: (!) 55 (!) 57  Resp: 17 17  Temp: 36.5 C (!) 36.3 C  SpO2: 98% 100%    Last Pain:  Vitals:   01/12/20 0757  TempSrc:   PainSc: 3                  Candra R Abagail Limb

## 2020-01-12 NOTE — Progress Notes (Signed)
Discharge instructions reviewed with patient at bedside.  Patient stated understanding and declined further instructions.  Patient ambulated off unit for discharge independently.

## 2020-01-12 NOTE — Progress Notes (Signed)
On call Md notified of IV discontinued and home blood pressure medicine per patient request

## 2020-01-12 NOTE — Discharge Summary (Signed)
Name: Jeremiah [name] MRN: [id number] DOB: Nov 09, 1970 49 y.o. PCP: Lacinda Axon, MD  Date of Admission: 01/09/2020  5:59 PM Date of Discharge: 01/12/2020 Attending Physician: Lalla Brothers, MD  Discharge Diagnosis: 1. Acute appendicitis 2. HIV 3. Tinea pedis  Discharge Medications: Allergies as of 01/12/2020      Reactions   Nsaids Other (See Comments)   DRUG INTERACTION WITH HIV MEDS   Sulfa Antibiotics Itching      Medication List    STOP taking these medications   hydrochlorothiazide 25 MG tablet Commonly known as: HYDRODIURIL   HYDROcodone-acetaminophen 5-325 MG tablet Commonly known as: NORCO/VICODIN     TAKE these medications   albuterol 108 (90 Base) MCG/ACT inhaler Commonly known as: VENTOLIN HFA Inhale 1-2 puffs into the lungs every 6 (six) hours as needed for wheezing or shortness of breath.   Biktarvy 50-200-25 MG Tabs tablet Generic drug: bictegravir-emtricitabine-tenofovir AF Take 1 tablet by mouth daily.   Clotrimazole 1 % Oint Apply thin layer on affected areas of toes/feet twice daily for 1 week or until rash is resolved   oxyCODONE-acetaminophen 5-325 MG tablet Commonly known as: Percocet Take 1 tablet by mouth every 6 (six) hours as needed for up to 3 days for severe pain.   senna 8.6 MG Tabs tablet Commonly known as: SENOKOT Take 2 tablets (17.2 mg total) by mouth 2 (two) times daily for 3 days.            Discharge Care Instructions  (From admission, onward)         Start     Ordered   01/12/20 0000  If the dressing is still on your incision site when you go home, remove it on the third day after your surgery date. Remove dressing if it begins to fall off, or if it is dirty or damaged before the third day.        01/12/20 1025          Disposition and follow-up:   JeremiahJeremiah [name] was discharged from [facility] in Stable condition.  At the hospital follow up visit please address:  1.   Appendicitis: Follow up after laparoscopic appendectomy with general surgery to assess for resolution of abdominal pain, wound healing, and assess for surgical complications.  HIV: Patient with drop in CD4 counts compared to labs form 06/2019. May be transient drop in the setting of appendicitis. Appears compliant with medication with undetectable viral load. Previously had difficulty tolerating bactrim due to itching. Please evaluate need for prophylactic antibiotics if CD4 counts worsen or fail to improve.  Tinea pedis: Patient started on clotrimazole for tinea pedis of the left 4th and 5th digit. Please evaluate for improvement and need for continued antifungals.  Hypertension: Patient normotensive during this admission after appendectomy. Appears to be normotensive/slightly hypotensive on previous office visit. Please evaluate for hypertension and need to restart BP medications.  Alcohol use with liver steatosis: CT with findings of alcoholic steatosis with history of drinking 1.5 pints of liquor daily. Patient counseled to moderate drinking to 2 standard drinks a day and appears to amenable to change. Will benefit from further counseling and education to prevent further liver damage and sequale of alcohol use.  2.  Labs / imaging needed at time of follow-up: None  3.  Pending labs/ test needing follow-up: None  Follow-up Appointments:  Follow-up Information    [facility] [place]. Go on 01/31/2020.   Specialty: General Surgery Why: Follow up appointment scheduled for  9:15 AM. Please arrive 30 min prior to appointment time. Bring photo ID and insurance information  Contact information: [address] [place] [address] [phone]        Lacinda Axon, MD Follow up.   Specialty: Internal Medicine Contact information: [place] [address] [phone]        Michel Bickers, MD .   Specialty: Infectious Diseases Contact information: [place] [place] [address] [place] Hospital Course by problem list: 1. Jeremiah [name] is 49 yo male with medical history of HIV(on Biktarvy), HLD,  anal cancer, Hyperlipidemia, COPD that presented for 2 day history of RLQ abdominal pain, and sent to [facility] from Internal Medicine clinic for further evaluation for acute appendicitis. Admitted on 01/11/20.   Appendicitis Patient presented with 2 history of abdominal pain that started in lower abdomen and radiated to RLQ. On presentation, vitals were stable with no leukocytosis or signs of sepsis. CT scan was consistent with Acute Appendicitis.  After receiving 1 day of antibiotics with Ceftriaxone and Metronidazole, and bowel regimen with Senna and Miralax, General Surgery performed a Laparoscopic Appendectomy. Prescribed 3 day dose of Oxycodone for pain management. He should follow up with Gen. Surgery and PCP.   Tinea pedis  Pain has previous history of tinea pedis. He has seen a podiatrist in the past, but reported symptoms have worsened over the last two weeks. Physical exam of feet was consistent with tinea pedis between all toes and skin cracking between 4th and 5th digit of L.foot. Provided topical antifungals in ED. He should continue Clotrimazole 1% cream and follow up with PCP.    HIV  Patient is currently taking Biktary He reports adherence to meds and sees Dr. Megan Salon. We will continue his home meds. His CD4 count is 187 and decreased from 324. 6 months ago. Viral load is still undetectable. He will follow up with Dr. Megan Salon 4 weeks post discharge.    Hypertension He was previously prescribed HCTZ 25mg . We held his home medication and his BP has been normotensive during his admission. We held at discharge and he should follow up with PCP for BP management.   Alcohol Use Patient reports that he drinks 1.5 pints of Liquor daily. He has not experienced cravings or withdrawal. CT A/P showed  evidence of Liver Steatosis. We discussed reducing alcohol intake and is amenable to moderation.  He may benefit from further education in outpatient setting          Discharge Vitals:   BP 123/81 (BP Location: Left Arm)   Pulse (!) 57   Temp 97.9 F (36.6 C) (Oral)   Resp 16   Ht 5\' 11"  (1.803 m)   Wt 68.5 kg   SpO2 100%   BMI 21.06 kg/m    BMP Latest Ref Rng & Units 01/12/2020 01/11/2020 01/10/2020  Glucose 70 - 99 mg/dL 148(H) 91 -  BUN 6 - 20 mg/dL 7 5(L) -  Creatinine 0.61 - 1.24 mg/dL 0.94 1.03 1.11  BUN/Creat Ratio 6 - 22 (calc) - - -  Sodium 135 - 145 mmol/L 140 138 -  Potassium 3.5 - 5.1 mmol/L 4.9 4.4 -  Chloride 98 - 111 mmol/L 103 104 -  CO2 22 - 32 mmol/L 26 29 -  Calcium 8.9 - 10.3 mg/dL 9.3 9.0 -   CBC    Component Value Date/Time  WBC 6.3 01/12/2020 0222   RBC 3.72 (L) 01/12/2020 0222   HGB 13.9 01/12/2020 0222   HGB 14.7 11/14/2019 1021   HCT 39.6 01/12/2020 0222   HCT 39.9 11/14/2019 1021   PLT 145 (L) 01/12/2020 0222   PLT 153 11/14/2019 1021   MCV 106.5 (H) 01/12/2020 0222   MCV 104 (H) 11/14/2019 1021   MCH 37.4 (H) 01/12/2020 0222   MCHC 35.1 01/12/2020 0222   RDW 11.6 01/12/2020 0222   RDW 12.8 11/14/2019 1021   LYMPHSABS 1.4 01/10/2020 0223   LYMPHSABS 1.1 11/14/2019 1021   MONOABS 0.5 01/10/2020 0223   EOSABS 0.3 01/10/2020 0223   EOSABS 0.1 11/14/2019 1021   BASOSABS 0.0 01/10/2020 0223   BASOSABS 0.0 11/14/2019 1021     Pertinent Labs, Studies, and Procedures:   Ref Range & Units 2 d ago  CD4 T Cell Abs 400 - 1,790 /uL 187Low   CD4 % Helper T Cell 33 - 65 % 19Low      Ref Range & Units 2 d ago  HIV 1 RNA Quant copies/mL <20 VC    CLINICAL DATA:  Lower abdominal pain  EXAM: CT ABDOMEN AND PELVIS WITH CONTRAST  TECHNIQUE: Multidetector CT imaging of the abdomen and pelvis was performed using the standard protocol following bolus administration of intravenous contrast.  CONTRAST:  154mL OMNIPAQUE IOHEXOL 300  MG/ML  SOLN  COMPARISON:  May 22, 2018  FINDINGS: Lower chest: There is scarring in the lung bases. There is no lung base edema or airspace opacity.  Hepatobiliary: There is hepatic steatosis. No focal liver lesions are appreciable. Gallbladder wall is not appreciably thickened. There is no biliary duct dilatation.  Pancreas: No pancreatic mass or inflammatory focus.  Spleen: No splenic lesions are evident.  Adrenals/Urinary Tract: Adrenals bilaterally appear unremarkable. There is a 5 x 4 mm probable cyst along the lateral mid left kidney. There is no evident hydronephrosis on either side. There is no evident renal or ureteral calculus on either side. Urinary bladder is midline with wall thickness within normal limits.  Stomach/Bowel: There is no appreciable bowel wall or mesenteric thickening. The terminal ileum appears normal. There is no bowel obstruction. There is no free air or portal venous air.  Vascular/Lymphatic: No abdominal aortic aneurysm. No arterial vascular lesions are evident. Major venous structures appear patent. There is no evident adenopathy in the abdomen or pelvis.  Reproductive: Prostate and seminal vesicles are normal in size and contour. No evident pelvic mass.  Other: The appendix is distended, measuring up to 12 mm in length. There is equivocal enhancement along portions of the wall of the appendix. There is no appendicolith evident. There is no associated abscess or perforation appreciable.  No abscess or ascites evident in the abdomen or pelvis.  Musculoskeletal: No blastic or lytic bone lesions. No intramuscular or abdominal wall lesions are evident.  IMPRESSION: 1.  Findings indicative of acute appendicitis.  Appendix: Location: Appendix arises inferomedially from the cecum with the appendix in the right pelvis at the mid iliac crest level.  Diameter: Up to 12 mm  Appendicolith: None  Mucosal hyper-enhancement:  Mild  Extraluminal gas: None  Periappendiceal collection: None  2. No bowel wall thickening or bowel obstruction. No abscess in the abdomen or pelvis.  3. No renal or ureteral calculus. No hydronephrosis. Urinary bladder wall thickness normal.  4.  Hepatic steatosis.  Critical Value/emergent results were called by telephone at the time of interpretation on 01/10/2020 at 10:05 am to  provider Mali Grose, RN, who verbally acknowledged these results.   Electronically Signed   By: Lowella Grip III M.D.   On: 01/10/2020 10:09  Discharge Instructions: Discharge Instructions    Diet - low sodium heart healthy   Complete by: As directed    Discharge instructions   Complete by: As directed    You were hospitalized for appendicitis. Thank you for allowing [place] to be part of your care.   New Medications: 1. Oxycodone- acetaminophen 5-325 mg every 6 hours as need for pain for 3 days 2. Senokot - take 1-2 tablets twice daily for constipation while taking oxycodone 3. Clotrimazole - please continue taking this cream on daily.   Please follow up with the following providers:  Follow-up Information    Sur[facility], [place]. Go on 01/31/2020.   Specialty: General Surgery Why: Follow up appointment scheduled for 9:15 AM. Please arrive 30 min  prior to appointment time. Bring photo ID and insurance information  Contact information: [address] [place] [address] [phone]        Lacinda Axon, MD Follow up.   Specialty: Internal Medicine Contact information: [place] [address] [phone]        Michel Bickers, MD .   Specialty: Infectious Diseases Contact information: [place] [place] [address] [phone]               Please note these changes made to your medications:   - Medications to continue: Continue your home medications  - Medications to start: You can take oxycodone  every 8 hours as needed for pain, continue using clotrimazole ointment for your foot  - Medications to discontinue:  Please hold off on taking you hydrochlorothiazide until you see your primary care provider  Please make sure to reduce your alcohol consumption to prevent further liver damage.  Please call our clinic if you have any questions or concerns, we may be able to help and keep you from a long and expensive emergency room wait. Our clinic and after hours phone number is [phone], the best time to call is Monday through Friday 9 am to 4 pm but there is always someone available 24/7 if you have an emergency. If you need medication refills please notify your pharmacy one week in advance and they will send [place] a request.   If the dressing is still on your incision site when you go home, remove it on the third day after your surgery date. Remove dressing if it begins to fall off, or if it is dirty or damaged before the third day.   Complete by: As directed    Increase activity slowly   Complete by: As directed       Signed: Iona Beard, MD 01/12/2020, 2:10 PM   Pager: [phone]

## 2020-01-12 NOTE — Progress Notes (Signed)
Patient requested for IV NSL to be removed. Explained the importance of IV in the event of emergency but continued to have it removed.

## 2020-01-13 ENCOUNTER — Telehealth: Payer: Self-pay | Admitting: *Deleted

## 2020-01-13 LAB — SURGICAL PATHOLOGY

## 2020-01-13 NOTE — Telephone Encounter (Signed)
Transition Care Management Follow-up Telephone Call  Date of discharge and from where: 01/12/2020 [facility]  How have you been since you were released from the hospital? "Still having some pain but that is to be expected  Any questions or concerns? No  Items Reviewed:  Did the pt receive and understand the discharge instructions provided? Yes   Medications obtained and verified? Yes   Any new allergies since your discharge? No   Dietary orders reviewed? Yes  Do you have support at home? Yes   Functional Questionnaire: (I = Independent and D = Dependent) ADLs: I  Bathing/Dressing- I  Meal Prep- I  Eating- I  Maintaining continence- I  Transferring/Ambulation- I  Managing Meds- I  Follow up appointments reviewed:   PCP Hospital f/u appt confirmed? No  - PEC Team to [facility] f/u appt confirmed? Yes  Scheduled to see Dr. Megan Salon (ID) on 02/14/2020 @ 1445.  Are transportation arrangements needed? No   If their condition worsens, is the pt aware to call PCP or go to the Emergency Dept.? Yes  Was the patient provided with contact information for the PCP's office or ED? Yes  Was to pt encouraged to call back with questions or concerns? Yes

## 2020-01-16 ENCOUNTER — Telehealth: Payer: Self-pay | Admitting: Student

## 2020-01-16 NOTE — Telephone Encounter (Signed)
Pt states he was instructed to stop hctz at discharge, but wasn't sure as to why it was stopped- Copied from 01/12/20  D/C summary : Hypertension: Patient normotensive during this admission after appendectomy. Appears to be normotensive/slightly hypotensive on previous office visit. Please evaluate for hypertension and need to restart BP medications.  Pt states he had a slight headache and wanted to know if he should restart hctz- CMAt informed pt that HA could be caused by other factors not just elevated BP and encouraged him to stay hydrated (water) and call if he develops fatigue, nausea, vomiting, dizziness, blurry vision, chest pain, or severe headache.  Pt does not have bp cuff to monitor pressure, but will follow up with [facility] on Tues 10/05 for BP check.Regenia Skeeter, Josef Tourigny [name]9/27/20213:07 PM

## 2020-01-16 NOTE — Telephone Encounter (Signed)
Pt requesting a call back about his Bp medications.  Please call patient back.

## 2020-01-19 ENCOUNTER — Telehealth: Payer: Self-pay

## 2020-01-19 ENCOUNTER — Encounter: Payer: Medicaid Other | Admitting: Internal Medicine

## 2020-01-19 NOTE — Telephone Encounter (Signed)
TC to patient, self-identified VM obtained and message left to call triage back. SChaplin, RN,BSN

## 2020-01-19 NOTE — Telephone Encounter (Signed)
Thank you :)

## 2020-01-19 NOTE — Telephone Encounter (Signed)
49 yo male with HIV, hypertension, and alcohol dependence. [facility] received a call from pt stating that he is having swelling, pain and yellow drainage from his toe. He was initially scheduled for an OV this afternoon with me however called and had to cancel due to transportation issues. He asked for a telehealth visit.  Pertinent chart details reviewed. Foot pain: IOV on 01/09/20 with Dr. Gilford Rile who indicates that there was an open wound on the medial aspect of the 5th digit but no discharge. Due to the finding of acute appendicitis, pt was subsequently admitted to the hospital for further management. He was hospitalized 9/20-9/23/21 for acute appendicitis and underwent surgical management with a laprascopic appendectomy.  Xray of the left 5th toe was obtained during that admission which was read as "Deformity of the distal phalanx of the left 5th toe is unchanged since prior study. This could be developmental or related to old injury or infection. No bone destruction to suggest acute osteomyelitis. No fracture, subluxation or dislocation."  On personal review of the xray, it does appear that the left 5th toe has a "moth eaten" appearance at the distal aspect. On further chart review, it appears that this has been there on prior exams as well.  Plan: Pt unable to come to  [facility] until 10/5 which is far too long for this issue. Pt will need evaluation in urgent care. Telehealth visit is not appropriate for this issue. On discussion with triage nurses, he was noted to be going through a fast food restaurant in [place] earlier today. Triage to discuss recommendations of urgent care evaluation with pt.  Mitzi Hansen, MD Internal Medicine Resident PGY-2 [facility] Pager: [phone] 01/19/2020 2:18 PM

## 2020-01-19 NOTE — Telephone Encounter (Signed)
Call placed to patient. Informed him that per Provider/Attending he should be seen as soon as possible at [facility] for foot sx. States he has no money for bus today and has no other transportation. States he will come to clinic tomorrow AM. Appt given with Red Team at 0945. He understands if he develops fever to present today to UC. Hubbard Hartshorn, BSN, RN-BC

## 2020-01-19 NOTE — Telephone Encounter (Addendum)
Received TC from patient, states his left toe is still swollen and now it is draining yellow drainage (LOV 01/09/20).  Informed him he would need an appt to be seen in clinic.  Appt made for today at 1:45 w/ Dr. Darrick Meigs (red team booked). SChaplin, RN,BSN  ADDENDUM:  Late entry:  As of note, when this RN spoke to patient and made original in-person appointment for today at 1345, patient stated he was in [place] at the drive through ordering food.  RN could not hear patient because order was being placed. RN waited until patient was finishing ordering food at the drive through to conduct triage call.  Patient then informed RN he would be in [place] in an hour; 1315 appt was offerred, patient declined and stated "that might be cutting it close for [place] to get there".  1345 appt was offerred, patient aggreeable and states "it gave him plenty of time to drive over".   SChaplin, RN,BSN

## 2020-01-19 NOTE — Telephone Encounter (Signed)
Patient called back stating he is unable to keep today's appt 2/2 transportation issues. Unable to come tomorrow as he has to be at work by 1000. Patient states left foot is warm, red, swollen, with yellow pus draining. Denies fever. Patient was given appt for 01/24/2020 by FO but explained to patient he should not wait that long as these sx are concerning. Advised he go to Urgent Care. States he can upload a picture through [employer]. Discussed with Provider who will determine if tele visit can be done today. Hubbard Hartshorn, BSN, RN-BC

## 2020-01-19 NOTE — Telephone Encounter (Signed)
Requesting to speak with a nurse about having feet pain, offered an appt pt refused. Please call pt back.

## 2020-01-20 ENCOUNTER — Encounter: Payer: Self-pay | Admitting: Internal Medicine

## 2020-01-20 ENCOUNTER — Telehealth: Payer: Self-pay | Admitting: *Deleted

## 2020-01-20 ENCOUNTER — Ambulatory Visit (INDEPENDENT_AMBULATORY_CARE_PROVIDER_SITE_OTHER): Payer: Medicaid Other | Admitting: Internal Medicine

## 2020-01-20 ENCOUNTER — Other Ambulatory Visit: Payer: Self-pay

## 2020-01-20 VITALS — BP 116/78 | HR 65 | Temp 98.2°F | Ht 71.0 in | Wt 157.2 lb

## 2020-01-20 DIAGNOSIS — M79672 Pain in left foot: Secondary | ICD-10-CM

## 2020-01-20 DIAGNOSIS — L03119 Cellulitis of unspecified part of limb: Secondary | ICD-10-CM

## 2020-01-20 DIAGNOSIS — Z9049 Acquired absence of other specified parts of digestive tract: Secondary | ICD-10-CM | POA: Diagnosis not present

## 2020-01-20 DIAGNOSIS — B20 Human immunodeficiency virus [HIV] disease: Secondary | ICD-10-CM | POA: Diagnosis not present

## 2020-01-20 MED ORDER — DOXYCYCLINE HYCLATE 100 MG PO TBEC: 100.0000 mg | DELAYED_RELEASE_TABLET | Freq: Two times a day (BID) | ORAL | 0 refills | Status: AC

## 2020-01-20 NOTE — Progress Notes (Signed)
CC: Foot wound  HPI:  Jeremiah [name] is a 49 y.o. male with a past medical history stated below and presents today for hospital follow up and foot wound. Please see problem based assessment and plan for additional details.  Past Medical History:  Diagnosis Date  . Anal cancer ([place])   . Anal warts   . Carpal tunnel syndrome    Right  . COPD (chronic obstructive pulmonary disease) ([place])   . Depression   . Hemorrhoids 02/25/2015  . History of tuberculin skin testing within last year 2014  . HIV (human immunodeficiency virus infection) ([place])   . Hypertension   . Latent syphilis   . Lung nodule    Right upper lobe  . Pneumonia 2007   x2  . Pneumothorax 07/01/2018   LEFT  . Rectal cancer [facility])     Current Outpatient Medications on File Prior to Visit  Medication Sig Dispense Refill  . albuterol (VENTOLIN HFA) 108 (90 Base) MCG/ACT inhaler Inhale 1-2 puffs into the lungs every 6 (six) hours as needed for wheezing or shortness of breath. 6.7 g 12  . bictegravir-emtricitabine-tenofovir AF (BIKTARVY) 50-200-25 MG TABS tablet Take 1 tablet by mouth daily. 30 tablet 5  . Clotrimazole 1 % OINT Apply thin layer on affected areas of toes/feet twice daily for 1 week or until rash is resolved 30 g 0   No current facility-administered medications on file prior to visit.    Family History  Problem Relation Age of Onset  . Diabetes Mother   . Hypertension Mother   . Cancer Mother        breast  . Diabetes Father   . Hypertension Father     Social History   Socioeconomic History  . Marital status: Single    Spouse name: Not on file  . Number of children: Not on file  . Years of education: Not on file  . Highest education level: Not on file  Occupational History  . Not on file  Tobacco Use  . Smoking status: Current Every Day Smoker    Packs/day: 0.10    Years: 27.00    Pack years: 2.70    Types: Cigars  . Smokeless tobacco: Never Used  . Tobacco comment: 4 cigars a  day  Vaping Use  . Vaping Use: Never used  Substance and Sexual Activity  . Alcohol use: Yes    Alcohol/week: 0.0 standard drinks    Comment: less than 40 oz a week  . Drug use: Not Currently    Comment: Past history of crack cocaine  use   . Sexual activity: Not Currently    Partners: Male    Birth control/protection: Condom    Comment: declined condoms 03/2019  Other Topics Concern  . Not on file  Social History Narrative  . Not on file   Social Determinants of Health   Financial Resource Strain:   . Difficulty of Paying Living Expenses: Not on file  Food Insecurity:   . Worried About Charity fundraiser in the Last Year: Not on file  . Ran Out of Food in the Last Year: Not on file  Transportation Needs:   . Lack of Transportation (Medical): Not on file  . Lack of Transportation (Non-Medical): Not on file  Physical Activity:   . Days of Exercise per Week: Not on file  . Minutes of Exercise per Session: Not on file  Stress:   . Feeling of Stress : Not on file  Social Connections:   . Frequency of Communication with Friends and Family: Not on file  . Frequency of Social Gatherings with Friends and Family: Not on file  . Attends Religious Services: Not on file  . Active Member of Clubs or Organizations: Not on file  . Attends Archivist Meetings: Not on file  . Marital Status: Not on file  Intimate Partner Violence:   . Fear of Current or Ex-Partner: Not on file  . Emotionally Abused: Not on file  . Physically Abused: Not on file  . Sexually Abused: Not on file    Review of Systems: ROS negative except for what is noted on the assessment and plan.  Vitals:   01/20/20 0933  BP: 116/78  Pulse: 65  Temp: 98.2 F (36.8 C)  TempSrc: Oral  SpO2: 100%  Weight: 157 lb 3.2 oz (71.3 kg)  Height: 5\' 11"  (1.803 m)     Physical Exam: Physical Exam Constitutional:      Appearance: Normal appearance.  HENT:     Head: Normocephalic and atraumatic.  Eyes:      Extraocular Movements: Extraocular movements intact.  Cardiovascular:     Rate and Rhythm: Normal rate.     Pulses: Normal pulses.     Heart sounds: Normal heart sounds.  Pulmonary:     Effort: Pulmonary effort is normal.     Breath sounds: Normal breath sounds.  Abdominal:     General: Bowel sounds are normal.     Palpations: Abdomen is soft.     Tenderness: There is no abdominal tenderness.  Musculoskeletal:        General: Normal range of motion.     Cervical back: Normal range of motion.     Right lower leg: No edema.     Left lower leg: No edema.       Feet:     Comments: Surgical sites are clean and dry without surrounding erythema or rash.  Skin:    General: Skin is warm and dry.     Findings: No lesion.  Neurological:     Mental Status: He is alert and oriented to person, place, and time. Mental status is at baseline.  Psychiatric:        Mood and Affect: Mood normal.      Assessment & Plan:   See Encounters Tab for problem based charting.  Patient discussed with Dr. Lars Mage, D.O. [facility], PGY-2 Pager: [phone], Phone: [phone] Date 01/21/2020 Time 6:39 AM

## 2020-01-20 NOTE — Telephone Encounter (Signed)
Call to [facility] for PA for Doxycycline Hyclate Tablets.   Spoke with representative who said that patient's insurance will cover the capsules without a PA.  Call to Pharmacist who also said that a change to capsules will go through.  Medication changed to capsules #14 for 7 day supply.  [place] # [id number].  Dr. Marianna Payment was notified of the change.  Sander Nephew, RN 01/20/2020 4:47 PM.

## 2020-01-20 NOTE — Patient Instructions (Signed)
Thank you, [name] for allowing [place] to provide your care today. Today we discussed recent appendicitis and foot wound.    I have ordered the following labs for you:  Lab Orders  No laboratory test(s) ordered today     I will call if any are abnormal. All of your labs can be accessed through "My Chart".  I have place a referrals to podiatry.  I have ordered the following tests: none   I have ordered the following medication/changed the following medications:  1. begin doxycyline 100 mg twice daily for 7 days  Please follow-up in in 2 weeks.  Should you have any questions or concerns please call the internal medicine clinic at [phone].    Marianna Payment, D.O. [facility]   My Chart Access: [email or web address]   If you have not already done so, please get your COVID 19 vaccine  To schedule an appointment for a COVID vaccine choice any of the following: Go to [email or web address]   Go to [email or web address]                  Call [phone]                                     Call [phone] and select Option 2

## 2020-01-21 ENCOUNTER — Encounter: Payer: Self-pay | Admitting: Internal Medicine

## 2020-01-21 NOTE — Assessment & Plan Note (Signed)
Patient has a history of HIV with  Decreased CD4 count during his previous admission. His HIV RNA was still suppressed. I spoke with Dr. Megan Salon and he does not believe that he needs prophylactic antibiotics for his low CD4 counts at this time.   CD4 T Cell Abs 400 - 1,790 /uL 187Low    CD4 % Helper T Cell 33 - 65 % 19Low     Plan: - Continue Biktarvy  - Follow up with Dr. Megan Salon in the follow in weeks.

## 2020-01-21 NOTE — Assessment & Plan Note (Addendum)
Patient presents for a hospital follow up form his recent admission for appendicitis. He is now s/p Laparoscopic appendectomy and doing well. Pain is well controlled and and surgical site looks clean and dry. He has a follow up appointment with GI in the following weeks. I counseled the patient to reach out to his surgical team if he has additional questions or needs a follow up appointment.

## 2020-01-21 NOTE — Assessment & Plan Note (Signed)
Patient continues to have foot lesion between the 4th and 5th digit. Originally thought to be tinea pedis and treated with topical clotrimazole. Patient has been using this medications for roughly 2 weeks. Patient states that the pain has improved but the lesion remains roughly the same size (see multiple images in the media tab). Patient has a new lesion is a 1-1.5 cm ulceration on the dorsal aspect of the left foot just over the 4th MTP joint. The new lesion does have some serosanguineous discharge, but I could not appreciate any abscesses in the surrounding area. He does have some TTP, but otherwise denies any additional symptoms. Patient has a history of HIV with low CD4 count. Has a follow up appointment with Dr. Megan Salon in the following weeks.   Plan: - Short course of doxycyline 100 mg Bid for 7 days - Referral to podiatry due to subacute wound.

## 2020-01-23 ENCOUNTER — Ambulatory Visit: Payer: Self-pay | Admitting: Podiatry

## 2020-01-24 ENCOUNTER — Encounter: Payer: Self-pay | Admitting: Student

## 2020-01-24 ENCOUNTER — Ambulatory Visit (INDEPENDENT_AMBULATORY_CARE_PROVIDER_SITE_OTHER): Payer: Medicaid Other | Admitting: Student

## 2020-01-24 ENCOUNTER — Other Ambulatory Visit: Payer: Self-pay

## 2020-01-24 DIAGNOSIS — B20 Human immunodeficiency virus [HIV] disease: Secondary | ICD-10-CM

## 2020-01-24 DIAGNOSIS — I10 Essential (primary) hypertension: Secondary | ICD-10-CM | POA: Diagnosis not present

## 2020-01-24 DIAGNOSIS — M79672 Pain in left foot: Secondary | ICD-10-CM

## 2020-01-24 NOTE — Assessment & Plan Note (Signed)
Patient states his left foot lesion between the 4th and 5th digits is healing. States pain between the digits have now resolved. 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 Patient states he is still waiting for the doxycycline that was prescribed last week and hopes to get it in the mail today. Patient states he has an appointment with podiatrist on 10/13 for further evaluation of his onychomycosis and subacute wound.  Foot exam showed healing lesion on top of 4th and 5th digits and fissuring and webbing between all digits of the left foot. No tenderness to palpation of the foot.  Plan: --Continue short course of doxycycline 100 mg twice daily for 7 days --Continue topical clotrimazole, apply daily between digits --Podiatry appointment on 10/13 --Advised patient to take socks off at home and allow air between the digits to keep it dry.

## 2020-01-24 NOTE — Progress Notes (Signed)
Internal Medicine Clinic Attending  I saw and evaluated the patient.  I personally confirmed the key portions of the history and exam documented by Dr. Coy Saunas and I reviewed pertinent patient test results.  The assessment, diagnosis, and plan were formulated together and I agree with the documentation in the resident's note.  Significant improvement since last week, despite no change in therapy.  Cellulitis appears to have resolved.  Maintaining dry environment between the toes is key to treatment of tinea pedis.  Discussed this with patient.

## 2020-01-24 NOTE — Patient Instructions (Signed)
Thank you, Jeremiah [name] for allowing [place] to provide your care today. Today we discussed your blood pressure and foot. Your BP is better so we do not plan on starting you on any medicines. Follow up with podiatrist for your foot.     Please follow-up in as needed  Should you have any questions or concerns please call the internal medicine clinic at [phone].    Linwood Dibbles, MD, MPH [facility]   My Chart Access: [email or web address]   If you have not already done so, please get your COVID 19 vaccine  To schedule an appointment for a COVID vaccine choice any of the following: Go to [email or web address]   Go to [email or web address]                  Call [phone]                                     Call [phone] and select Option 2

## 2020-01-24 NOTE — Progress Notes (Signed)
   CC: BP check  HPI:  Jeremiah [name] is a 49 y.o. male with PMH of HIV, HTN, COPD and rectal cancer who presented for BP check.  Patient states his abdominal surgical wounds and foot infection healing.  States he has not received his doxycycline yet but it will be delivered to him today.  Patient reports using his albuterol about 3 times a month.  Patient denies any abdominal pain, dizziness, headache, foot pain or shortness of breath.  Please see problem based charting for evaluation, assessment and plan.  Past Medical History:  Diagnosis Date  . Anal cancer ([place])   . Anal warts   . Carpal tunnel syndrome    Right  . COPD (chronic obstructive pulmonary disease) ([place])   . Depression   . Hemorrhoids 02/25/2015  . History of tuberculin skin testing within last year 2014  . HIV (human immunodeficiency virus infection) ([place])   . Hypertension   . Latent syphilis   . Lung nodule    Right upper lobe  . Pneumonia 2007   x2  . Pneumothorax 07/01/2018   LEFT  . Rectal cancer ([place])    Review of Systems:  All ROS negative otherwise as stated in the HPI  Physical Exam:  General: Pleasant middle-age male sitting on chair. No acute distress. Well nourished, well developed. Head: Normocephalic, atraumatic w/o tenderness Cardiac: RRR. No murmurs, rubs or gallops. S1, S2. No lower extremities edema Respiratory: Lungs CTAB. No wheezing or crackles. No increased WOB Abdominal: Soft, symmetric and non tender. No organomegaly. Normal bowel sounds.  Healing surgical wounds. Skin: Warm, dry and intact without rashes or lesions  Extremities: Atraumatic. Full ROM. Pulse palpable. Healing tinea pedis with surrounding granulation tissue on top of left 4th and 5th digits. Onychomycosis with fissuring and webbing between digits, worse between 4th and 5th digits Neuro: A&O x 3. Moves all extremities Psych: Appropriate mood and affect. Normal judgement  Vitals:   01/24/20 0907  BP: 119/73  Pulse:  91  SpO2: 99%  Weight: 154 lb 9.6 oz (70.1 kg)    Assessment & Plan:   See Encounters Tab for problem based charting.  Patient seen with Dr. Louann Liv, MD, MPH

## 2020-01-24 NOTE — Assessment & Plan Note (Addendum)
Recent decrease in CD4 count but undetected viral load.  No need for prophylactic antibiotics per Dr. Megan Salon.  Patient has an appointment with Dr. Megan Salon on 10/26.  We will continue to monitor.  Patient is currently on [place].

## 2020-01-24 NOTE — Assessment & Plan Note (Signed)
Patient's BP today is normotensive at 119/73.  Denies any headaches, dizziness or blurry vision.  Patient was taken off HCTZ 25 mg daily during hospital admission due to slightly low BP.  Patient's BP is stable with no indications to restart an antihypertensive at this time.  We will continue to monitor.

## 2020-01-27 ENCOUNTER — Encounter: Payer: Self-pay | Admitting: *Deleted

## 2020-01-27 ENCOUNTER — Other Ambulatory Visit: Payer: Self-pay | Admitting: *Deleted

## 2020-01-27 NOTE — Progress Notes (Signed)
Internal Medicine Clinic Attending  Case discussed with Dr. Coe  At the time of the visit.  We reviewed the resident's history and exam and pertinent patient test results.  I agree with the assessment, diagnosis, and plan of care documented in the resident's note.  

## 2020-01-30 ENCOUNTER — Ambulatory Visit: Payer: Medicaid Other | Admitting: Gastroenterology

## 2020-02-01 ENCOUNTER — Ambulatory Visit: Payer: Self-pay | Admitting: Podiatry

## 2020-02-06 ENCOUNTER — Encounter: Payer: Self-pay | Admitting: Pulmonary Disease

## 2020-02-14 ENCOUNTER — Ambulatory Visit (INDEPENDENT_AMBULATORY_CARE_PROVIDER_SITE_OTHER): Payer: Medicaid Other

## 2020-02-14 ENCOUNTER — Ambulatory Visit (INDEPENDENT_AMBULATORY_CARE_PROVIDER_SITE_OTHER): Payer: Medicaid Other | Admitting: Internal Medicine

## 2020-02-14 ENCOUNTER — Other Ambulatory Visit: Payer: Self-pay

## 2020-02-14 DIAGNOSIS — C210 Malignant neoplasm of anus, unspecified: Secondary | ICD-10-CM | POA: Diagnosis not present

## 2020-02-14 DIAGNOSIS — Z23 Encounter for immunization: Secondary | ICD-10-CM

## 2020-02-14 DIAGNOSIS — B20 Human immunodeficiency virus [HIV] disease: Secondary | ICD-10-CM | POA: Diagnosis not present

## 2020-02-14 DIAGNOSIS — F419 Anxiety disorder, unspecified: Secondary | ICD-10-CM

## 2020-02-14 DIAGNOSIS — J8410 Pulmonary fibrosis, unspecified: Secondary | ICD-10-CM

## 2020-02-14 DIAGNOSIS — Z9049 Acquired absence of other specified parts of digestive tract: Secondary | ICD-10-CM | POA: Diagnosis not present

## 2020-02-14 MED ORDER — BIKTARVY 50-200-25 MG PO TABS: 1.0000 | ORAL_TABLET | Freq: Every day | ORAL | 11 refills | Status: DC

## 2020-02-14 NOTE — Assessment & Plan Note (Signed)
His infection remains under excellent, long-term control but he has had intermittent CD4 lymphopenia.  This is probably related to past chemotherapy.  His recent appendicitis may also have had an impact on his CD4 count.  I will repeat his CD4 count today.  If it remains less than 200 I will start him on dapsone prophylaxis.  He will continue Biktarvy and follow-up after lab work in 6 months.

## 2020-02-14 NOTE — Assessment & Plan Note (Signed)
He is recovering uneventfully.

## 2020-02-14 NOTE — Assessment & Plan Note (Signed)
He has acute on chronic anxiety.  I will arrange an evaluation by ourr behavioral health counselor.

## 2020-02-14 NOTE — Assessment & Plan Note (Signed)
His granuloma is smaller.

## 2020-02-14 NOTE — Progress Notes (Signed)
Patient Active Problem List   Diagnosis Date Noted  . Pneumothorax on left 06/30/2018    Priority: High  . 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[place]) 01/06/2018    Priority: High  . Anal cancer ([place]) 11/07/2015    Priority: Medium  . Human immunodeficiency virus (HIV) disease ([place]) 05/19/2013    Priority: Medium  . S/P laparoscopic appendectomy 01/10/2020  . Right sided abdominal pain 01/09/2020  . Anal fissure 08/02/2019  . Foot pain, left 05/20/2019  . Hypertension 02/21/2019  . Screening for diabetes mellitus 02/21/2019  . Anxiety 09/29/2018  . Lung nodule 06/23/2018  . Dyslipidemia 05/19/2013  . Cigarette smoker 05/19/2013  . Depression 05/19/2013  . Dental caries 05/19/2013  . Hx of unilateral orchiectomy 05/19/2013  . Latent syphilis 05/06/2013  . Anal warts 05/06/2013  . History of chlamydia 05/06/2013    Patient's Medications  New Prescriptions   No medications on file  Previous Medications   ALBUTEROL (VENTOLIN HFA) 108 (90 BASE) MCG/ACT INHALER    Inhale 1-2 puffs into the lungs every 6 (six) hours as needed for wheezing or shortness of breath.   CLOTRIMAZOLE 1 % OINT    Apply thin layer on affected areas of toes/feet twice daily for 1 week or until rash is resolved  Modified Medications   Modified Medication Previous Medication   BICTEGRAVIR-EMTRICITABINE-TENOFOVIR AF (BIKTARVY) 50-200-25 MG TABS TABLET bictegravir-emtricitabine-tenofovir AF (BIKTARVY) 50-200-25 MG TABS tablet      Take 1 tablet by mouth daily.    Take 1 tablet by mouth daily.  Discontinued Medications   No medications on file    Subjective: Jeremiah [name] is in for his routine HIV follow-up visit.  He has not had any problems obtaining, taking or tolerating his Biktarvy and never misses doses.  He was hospitalized last month with acute appendicitis and underwent a laparoscopic appendectomy.  He is recovering uneventfully.  He had follow-up CT scan in June that showed his pulmonary nodule had decreased  in size.  His anal cancer is in remission.  He has received his initial 2 Covid vaccines about his booster dose here today along with his influenza vaccine.  He is feeling well other than increased generalized anxiety over the last year.  Review of Systems: Review of Systems  Constitutional: Negative for chills, diaphoresis, fever, malaise/fatigue and weight loss.  HENT: Negative for sore throat.   Respiratory: Negative for cough, sputum production and shortness of breath.   Cardiovascular: Negative for chest pain.  Gastrointestinal: Positive for abdominal pain. Negative for diarrhea, heartburn, nausea and vomiting.       He has very mild periumbilical soreness following his recent surgery.  Genitourinary: Negative for dysuria and frequency.  Musculoskeletal: Negative for joint pain and myalgias.  Skin: Negative for rash.  Neurological: Negative for dizziness and headaches.  Psychiatric/Behavioral: Negative for depression. The patient is nervous/anxious.     Past Medical History:  Diagnosis Date  . Anal cancer ([place])   . Anal intraepithelial neoplasia I (AIN I)   . Anal warts   . Appendicitis   . Carpal tunnel syndrome    Right  . COPD (chronic obstructive pulmonary disease) ([place])   . Depression   . Emphysema lung ([place])   . Hemorrhoids 02/25/2015  . Hepatic steatosis 2021  . History of tuberculin skin testing within last year 2014  . HIV (human immunodeficiency virus infection) ([place])   . Hypertension   . Internal hemorrhoids   . Latent syphilis   .  Lung nodule    Right upper lobe  . Pneumonia 2007   x2  . Pneumothorax 07/01/2018   LEFT  . Rectal cancer ([place])   . Tinea pedis     Social History   Tobacco Use  . Smoking status: Current Every Day Smoker    Packs/day: 0.10    Years: 27.00    Pack years: 2.70    Types: Cigars  . Smokeless tobacco: Never Used  . Tobacco comment: 4 cigars a day  Vaping Use  . Vaping Use: Never used  Substance Use Topics  . Alcohol use:  Yes    Alcohol/week: 0.0 standard drinks    Comment: 1.5 pt liquior day  . Drug use: Not Currently    Comment: Past history of crack cocaine  use     Family History  Problem Relation Age of Onset  . Diabetes Mother   . Hypertension Mother   . Cancer Mother        breast  . Diabetes Father   . Hypertension Father     Allergies  Allergen Reactions  . Nsaids Other (See Comments)    DRUG INTERACTION WITH HIV MEDS  . Sulfa Antibiotics Itching    Health Maintenance  Topic Date Due  . COVID-19 Vaccine (2 - Pfizer 2-dose series) 03/06/2020  . TETANUS/TDAP  05/19/2029  . INFLUENZA VACCINE  Completed  . Hepatitis C Screening  Completed  . HIV Screening  Completed    Objective:  Vitals:   02/14/20 1416  BP: 110/72  Pulse: 74  Temp: 98 F (36.7 C)  Weight: 160 lb (72.6 kg)  Height: 5\' 11"  (1.803 m)   Body mass index is 22.32 kg/m.  Physical Exam Constitutional:      Comments: He is talkative and in good spirits.  Cardiovascular:     Rate and Rhythm: Normal rate and regular rhythm.     Heart sounds: No murmur heard.   Pulmonary:     Effort: Pulmonary effort is normal.     Breath sounds: Normal breath sounds.  Abdominal:     Palpations: Abdomen is soft.     Tenderness: There is no abdominal tenderness.  Musculoskeletal:        General: No swelling or tenderness.  Skin:    Findings: No rash.  Neurological:     General: No focal deficit present.  Psychiatric:        Mood and Affect: Mood normal.     Lab Results Lab Results  Component Value Date   WBC 6.3 01/12/2020   HGB 13.9 01/12/2020   HCT 39.6 01/12/2020   MCV 106.5 (H) 01/12/2020   PLT 145 (L) 01/12/2020    Lab Results  Component Value Date   CREATININE 0.94 01/12/2020   BUN 7 01/12/2020   NA 140 01/12/2020   K 4.9 01/12/2020   CL 103 01/12/2020   CO2 26 01/12/2020    Lab Results  Component Value Date   ALT 42 01/10/2020   AST 91 (H) 01/10/2020   ALKPHOS 51 01/10/2020   BILITOT 1.6  (H) 01/10/2020    Lab Results  Component Value Date   CHOL 181 06/20/2019   HDL 77 06/20/2019   LDLCALC 81 06/20/2019   TRIG 130 06/20/2019   CHOLHDL 2.4 06/20/2019   Lab Results  Component Value Date   LABRPR REACTIVE (A) 06/20/2019   RPRTITER 1:2 (H) 06/20/2019   HIV 1 RNA Quant (copies/mL)  Date Value  01/10/2020 <20  06/20/2019 <20 NOT  DETECTED  03/08/2019 <20 NOT DETECTED   CD4 T Cell Abs (/uL)  Date Value  01/10/2020 187 (L)  06/20/2019 324 (L)  03/08/2019 239 (L)     Problem List Items Addressed This Visit      High   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[place])    His granuloma is smaller.        Medium   Human immunodeficiency virus (HIV) disease ([place])    His infection remains under excellent, long-term control but he has had intermittent CD4 lymphopenia.  This is probably related to past chemotherapy.  His recent appendicitis may also have had an impact on his CD4 count.  I will repeat his CD4 count today.  If it remains less than 200 I will start him on dapsone prophylaxis.  He will continue Biktarvy and follow-up after lab work in 6 months.      Relevant Medications   bictegravir-emtricitabine-tenofovir AF (BIKTARVY) 50-200-25 MG TABS tablet   Other Relevant Orders   T-helper cell (CD4)- ([facility] clinic only)   T-helper cell (CD4)- ([facility] clinic only)   HIV-1 RNA quant-no reflex-bld   CBC   Comprehensive metabolic panel   Lipid panel   RPR   Anal cancer ([place])    In remission.      Relevant Medications   bictegravir-emtricitabine-tenofovir AF (BIKTARVY) 50-200-25 MG TABS tablet     Unprioritized   S/P laparoscopic appendectomy    He is recovering uneventfully.      Anxiety    He has acute on chronic anxiety.  I will arrange an evaluation by ourr behavioral health counselor.           Michel Bickers, MD [facility] [phone] pager   [phone] cell 02/14/2020, 2:47 PM

## 2020-02-14 NOTE — Assessment & Plan Note (Signed)
In remission.

## 2020-02-15 LAB — T-HELPER CELL (CD4) - (RCID CLINIC ONLY)
CD4 % Helper T Cell: 21 % — ABNORMAL LOW (ref 33–65)
CD4 T Cell Abs: 253 /uL — ABNORMAL LOW (ref 400–1790)

## 2020-03-01 ENCOUNTER — Ambulatory Visit: Payer: Self-pay

## 2020-03-01 NOTE — Addendum Note (Signed)
Addended by: Yvonna Alanis E on: 03/01/2020 11:54 AM   Modules accepted: Orders

## 2020-03-05 DIAGNOSIS — Z85048 Personal history of other malignant neoplasm of rectum, rectosigmoid junction, and anus: Secondary | ICD-10-CM | POA: Diagnosis not present

## 2020-03-08 ENCOUNTER — Other Ambulatory Visit: Payer: Self-pay

## 2020-03-08 NOTE — Patient Outreach (Signed)
   Care Coordination - Case Manager  03/08/2020  Jeremiah [name] May 06, 1970 [id number]  Subjective:  Jeremiah [name] is an 49 y.o. year old male who is a primary patient of A[place], Charisse March, MD.  Jeremiah [name] was given information about Medicaid Managed Care team care coordination services today. Jeremiah [name] agreed to services and verbal consent obtained  Review of patient status, laboratory and other test data was performed as part of evaluation for provision of services.  SDOH: SDOH Screenings   Alcohol Screen:   . Last Alcohol Screening Score (AUDIT): Not on file  Depression (PHQ2-9): Low Risk   . PHQ-2 Score: 0  Financial Resource Strain:   . Difficulty of Paying Living Expenses: Not on file  Food Insecurity:   . Worried About Charity fundraiser in the Last Year: Not on file  . Ran Out of Food in the Last Year: Not on file  Housing:   . Last Housing Risk Score: Not on file  Physical Activity:   . Days of Exercise per Week: Not on file  . Minutes of Exercise per Session: Not on file  Social Connections:   . Frequency of Communication with Friends and Family: Not on file  . Frequency of Social Gatherings with Friends and Family: Not on file  . Attends Religious Services: Not on file  . Active Member of Clubs or Organizations: Not on file  . Attends Archivist Meetings: Not on file  . Marital Status: Not on file  Stress:   . Feeling of Stress : Not on file  Tobacco Use: High Risk  . Smoking Tobacco Use: Current Every Day Smoker  . Smokeless Tobacco Use: Never Used  Transportation Needs:   . Film/video editor (Medical): Not on file  . Lack of Transportation (Non-Medical): Not on file     Objective:    Allergies  Allergen Reactions  . Nsaids Other (See Comments)    DRUG INTERACTION WITH HIV MEDS  . Sulfa Antibiotics Itching    Medications:    Medications Reviewed Today    Reviewed by Jeremiah Medicus, RN (Registered Nurse) on  03/08/20 at 77  Med List Status: <None>  Medication Order Taking? Sig Documenting Provider Last Dose Status Informant  albuterol (VENTOLIN HFA) 108 (90 Base) MCG/ACT inhaler [id number] Yes Inhale 1-2 puffs into the lungs every 6 (six) hours as needed for wheezing or shortness of breath. Lauraine Rinne, NP Taking Active Self  bictegravir-emtricitabine-tenofovir AF (BIKTARVY) 50-200-25 MG TABS tablet [id number] Yes Take 1 tablet by mouth daily. Michel Bickers, MD Taking Active   Clotrimazole 1 % OINT [id number] Yes Apply thin layer on affected areas of toes/feet twice daily for 1 week or until rash is resolved Little, Wenda Overland, MD Taking Active          Plan: [facility] will follow up with patient within 30 days.

## 2020-03-08 NOTE — Patient Instructions (Signed)
  Hi Mr. Jean you for speaking with me today.  Mr. Slatten was given information about Medicaid Managed Care team care coordination services as a part of their Healthy [facility] Medicaid benefit. Lynkin Saini verbally consented to engagement with the [facility] Managed Care team.   For questions related to your [facility], please call: [phone] or visit the homepage here: [email or web address]  If you would like to schedule transportation through your [facility], please call the following number at least 2 days in advance of your appointment: [phone]  Patient verbalizes understanding of instructions provided today.   The Managed Medicaid care management team will reach out to the patient again over the next 30 days.  The patient has been provided with contact information for the Managed Medicaid care management team and has been advised to call with any health related questions or concerns.   Aida Raider RN, BSN [place]  Triad Curator - Managed Medicaid High Risk [phone].

## 2020-03-14 ENCOUNTER — Other Ambulatory Visit: Payer: Self-pay

## 2020-03-14 NOTE — Patient Instructions (Addendum)
An unsuccessful telephone outreach was attempted today. The patient was referred to the case management team for assistance with care management and care coordination.   Follow Up Plan: The Managed Medicaid care management team will reach out to the patient again over the next 14 days.  The patient has been provided with contact information for the Managed Medicaid care management team and has been advised to call with any health related questions or concerns.  LCSW will follow up on March 26, 2020 @ [place], BSW, MSW, [place]: [phone]

## 2020-03-14 NOTE — Patient Outreach (Addendum)
Care Coordination  03/14/2020  Harrie Cazarez 1970/07/11 [id number]  An unsuccessful telephone outreach was attempted today. The patient was referred to the case management team for assistance with care management and care coordination.   Follow Up Plan: The Managed Medicaid care management team will reach out to the patient again over the next 14 days.    Netta Neat, BSW, MSW, LCSW Social Work Case Freight forwarder - [place]  Direct [place]: [phone]

## 2020-03-20 DIAGNOSIS — F411 Generalized anxiety disorder: Secondary | ICD-10-CM | POA: Diagnosis not present

## 2020-03-26 ENCOUNTER — Other Ambulatory Visit: Payer: Self-pay

## 2020-03-26 NOTE — Patient Outreach (Signed)
Care Coordination  03/26/2020  [name] 12-Jan-1971 [id number]   Medicaid Managed Care   Unsuccessful Outreach Note  03/26/2020 Name: Jeremiah [name] MRN: [id number] DOB: 1970/05/13  Referred by: Lacinda Axon, MD Reason for referral : High Risk Managed Medicaid (Therapy needs for anxiety)   [name] is enrolled in a Managed Medicaid Health Plan: Yes  A second unsuccessful telephone outreach was attempted today. The patient was referred to the case management team for assistance with care management and care coordination.   Follow Up Plan: The Managed Medicaid care management team will reach out to the patient again over the next 7 days.   Netta Neat, BSW, MSW, LCSW Social Work Case Freight forwarder - [place]  Direct [place]: [phone]

## 2020-03-26 NOTE — Patient Instructions (Signed)
  Medicaid Managed Care   Unsuccessful Outreach Note  03/26/2020 Name: Jeremiah [name] MRN: [id number] DOB: 1971-03-03  Referred by: Lacinda Axon, MD Reason for referral : High Risk Managed Medicaid (Therapy needs for anxiety)   Jeremiah [name] is enrolled in a Managed Medicaid Health Plan: Yes  A second unsuccessful telephone outreach was attempted today. The patient was referred to the case management team for assistance with care management and care coordination.   Follow Up Plan: The Managed Medicaid care management team will reach out to the patient again over the next 7 days.   Jeremiah [name], BSW, MSW, LCSW Social Work Case Freight forwarder - [place]  Direct [place]: [phone]

## 2020-03-29 ENCOUNTER — Telehealth: Payer: Self-pay | Admitting: Student

## 2020-03-29 NOTE — Telephone Encounter (Signed)
Attempted to reach Jeremiah [name] today to reschedule his appointment he missed with the Managed Medicaid LCSW. I left my name and number for him to return my call.

## 2020-04-02 ENCOUNTER — Other Ambulatory Visit: Payer: Self-pay | Admitting: Obstetrics and Gynecology

## 2020-04-02 NOTE — Patient Instructions (Signed)
Hi Mr. Leete, sorry we missed you today  - as a part of your Medicaid benefit, you are eligible for care management and care coordination services at no cost or copay. I was unable to reach you by phone today but would be happy to help you with your health related needs. Please feel free to call me at [phone].  A member of the Managed Medicaid care management team will reach out to you again over the next 7-14 days.   Aida Raider RN, BSN [place]  Triad Curator - Managed Medicaid High Risk [phone].

## 2020-04-02 NOTE — Patient Outreach (Signed)
Care Coordination  04/02/2020  Ford Peddie [birth year] [id number]    Medicaid Managed Care   Unsuccessful Outreach Note  04/02/2020 Name: Jeremiah [name] MRN: [id number] DOB: 06-May-1970  Referred by: Lacinda Axon, MD Reason for referral : High Risk Managed Medicaid (Unsuccessful telephone outreach)   An unsuccessful telephone outreach was attempted today. The patient was referred to the case management team for assistance with care management and care coordination.   Follow Up Plan: RNCM will follow up with patient within 7-14 days.  Aida Raider RN, BSN [place]  Triad Curator - Managed Medicaid High Risk [phone].

## 2020-04-23 ENCOUNTER — Other Ambulatory Visit: Payer: Self-pay | Admitting: Obstetrics and Gynecology

## 2020-04-23 NOTE — Patient Outreach (Signed)
Care Coordination  04/23/2020  Legrande Hao 1971-03-24 [id number]    Medicaid Managed Care   Unsuccessful Outreach Note  04/23/2020 Name: Aydyn Testerman MRN: [id number] DOB: 02/15/1971  Referred by: Steffanie Rainwater, MD Reason for referral : High Risk Managed Medicaid (Unsuccessful telephone outreach)   A second unsuccessful telephone outreach was attempted today. The patient was referred to the case management team for assistance with care management and care coordination.     Follow Up Plan: A member of the Managed Medicaid team will reach out to the patient again within 7 days to reschedule appointment.  Kathi Der RN, BSN [place]  Triad Engineer, production - Managed Medicaid High Risk [phone].

## 2020-04-23 NOTE — Patient Instructions (Signed)
Hi Mr. Samford, sorry we missed you today- as a part of your Medicaid benefit, you are eligible for care management and care coordination services at no cost or copay. I was unable to reach you by phone today but would be happy to help you with your health related needs. Please feel free to call me at [phone].  A member of the Managed Medicaid care management team will reach out to you again over the next 7 days.   Kathi Der RN, BSN [place]  Triad Engineer, production - Managed Medicaid High Risk [phone].

## 2020-04-24 ENCOUNTER — Telehealth: Payer: Self-pay | Admitting: Student

## 2020-04-24 NOTE — Telephone Encounter (Signed)
Attempted to reach Jeremiah [name] to reschedule missed phone appts with the [facility] LCSW and the RN Case Manager. I tried his  Number multiple times and I lept getting the recording that my call could not be completed at this time but to try again later. I will reach out again in the next 7-14 days.

## 2020-04-30 ENCOUNTER — Telehealth: Payer: Self-pay | Admitting: Student

## 2020-04-30 NOTE — Telephone Encounter (Signed)
Third attempt today to reach Jeremiah [name] to get him rescheduled with the [facility] RN Case Manager. I was not able to leave a message.

## 2020-05-08 ENCOUNTER — Ambulatory Visit: Payer: Medicaid Other

## 2020-05-09 ENCOUNTER — Telehealth: Payer: Self-pay

## 2020-05-09 ENCOUNTER — Other Ambulatory Visit: Payer: Self-pay

## 2020-05-09 ENCOUNTER — Other Ambulatory Visit: Payer: Self-pay | Admitting: Obstetrics and Gynecology

## 2020-05-09 NOTE — Patient Instructions (Signed)
  Hi Mr. Branton  - as a part of your Medicaid benefit, you are eligible for care management and care coordination services at no cost or copay. I was unable to reach you by phone today but would be happy to help you with your health related needs. Please feel free to call me at [phone].  Aida Raider RN, BSN   Triad Curator - Managed Medicaid High Risk [phone].

## 2020-05-09 NOTE — Telephone Encounter (Signed)
RTC, message received that "call cannot be completed at this time, please hang up and try again later". SChaplin, RN,BSN

## 2020-05-09 NOTE — Telephone Encounter (Signed)
Pt states he has been in contact with someone who tested positive for COVID, want to know should he be tested. Please call pt back.

## 2020-05-09 NOTE — Patient Outreach (Signed)
Care Coordination  05/09/2020  Hamza Empson [birth year] [id number]   Medicaid Managed Care   Unsuccessful Outreach Note  05/09/2020 Name: Krosby Ritchie MRN: [id number] DOB: Feb 22, 1971  Referred by: Lacinda Axon, MD Reason for referral : No chief complaint on file.   Third unsuccessful telephone outreach was attempted today. The patient was referred to the case management team for assistance with care management and care coordination. The patient's primary care provider has been notified of our unsuccessful attempts to make or maintain contact with the patient. The care management team is pleased to engage with this patient at any time in the future should he/she be interested in assistance from the care management team.   Follow Up Plan: The patient has been provided with contact information for the care management team and has been advised to call with any health related questions or concerns.  We have been unable to make contact with the patient for follow up. The care management team is available to follow up with the patient after provider conversation with the patient regarding recommendation for care management engagement and subsequent re-referral to the care management team.   Aida Raider RN, BSN [place] Management Coordinator - Managed [facility] High Risk [phone].

## 2020-05-10 NOTE — Telephone Encounter (Signed)
Patient returned call. 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 Patient with cough and AM H/As. Gave number to schedule Covid test [phone]). Patient states he has to work today but will call to schedule. Explained that if he is positive for Covid, he could infect others. Recommendation is to get tested and quarantine till he receives result. He will wear a mask and stay at least 6 feet away from others. Patient received booster vaccine in 01/2020. Hubbard Hartshorn, BSN, RN-BC

## 2020-05-10 NOTE — Telephone Encounter (Signed)
Noted. He should be quarantining like you said but we can't force him to stay home so we will just have to wait to see his test results.

## 2020-05-14 ENCOUNTER — Other Ambulatory Visit: Payer: Medicaid Other

## 2020-05-21 ENCOUNTER — Telehealth: Payer: Self-pay

## 2020-05-21 MED ORDER — ALBUTEROL SULFATE HFA 108 (90 BASE) MCG/ACT IN AERS: 1.0000 | INHALATION_SPRAY | Freq: Four times a day (QID) | RESPIRATORY_TRACT | 12 refills | Status: DC | PRN

## 2020-05-21 NOTE — Telephone Encounter (Signed)
Requesting to speak with a nurse about getting nebulizer. Please call pt back.

## 2020-05-21 NOTE — Telephone Encounter (Signed)
Okay, I have signed the order for his inhaler.

## 2020-05-21 NOTE — Telephone Encounter (Signed)
Pt requesting refill on albuterol inhaler and not a nebulizer-will send request to appropriate team for consideration.Jeremiah Hidden [name]1/31/202210:13 AM

## 2020-06-11 ENCOUNTER — Encounter: Payer: Self-pay | Admitting: Student

## 2020-06-11 ENCOUNTER — Ambulatory Visit (INDEPENDENT_AMBULATORY_CARE_PROVIDER_SITE_OTHER): Payer: Medicaid Other | Admitting: Student

## 2020-06-11 ENCOUNTER — Other Ambulatory Visit: Payer: Self-pay

## 2020-06-11 VITALS — BP 104/88 | HR 76 | Temp 98.8°F | Ht 71.0 in | Wt 164.0 lb

## 2020-06-11 DIAGNOSIS — Z202 Contact with and (suspected) exposure to infections with a predominantly sexual mode of transmission: Secondary | ICD-10-CM | POA: Diagnosis not present

## 2020-06-11 DIAGNOSIS — H538 Other visual disturbances: Secondary | ICD-10-CM | POA: Insufficient documentation

## 2020-06-11 DIAGNOSIS — R519 Headache, unspecified: Secondary | ICD-10-CM

## 2020-06-11 NOTE — Progress Notes (Signed)
CC: Headache  HPI:  [name] is a 50 y.o. male with a past medical history stated below and presents today for headache. Please see problem based assessment and plan for additional details.  Past Medical History:  Diagnosis Date  . Anal cancer ([place])   . Anal intraepithelial neoplasia I (AIN I)   . Anal warts   . Appendicitis   . Carpal tunnel syndrome    Right  . COPD (chronic obstructive pulmonary disease) ([place])   . Depression   . Emphysema lung ([place])   . Hemorrhoids 02/25/2015  . Hepatic steatosis 2021  . History of tuberculin skin testing within last year 2014  . HIV (human immunodeficiency virus infection) ([place])   . Hypertension   . Internal hemorrhoids   . Latent syphilis   . Lung nodule    Right upper lobe  . Pneumonia 2007   x2  . Pneumothorax 07/01/2018   LEFT  . Rectal cancer ([place])   . Tinea pedis     Current Outpatient Medications on File Prior to Visit  Medication Sig Dispense Refill  . albuterol (VENTOLIN HFA) 108 (90 Base) MCG/ACT inhaler Inhale 1-2 puffs into the lungs every 6 (six) hours as needed for wheezing or shortness of breath. 6.7 g 12  . bictegravir-emtricitabine-tenofovir AF (BIKTARVY) 50-200-25 MG TABS tablet Take 1 tablet by mouth daily. 30 tablet 11  . Clotrimazole 1 % OINT Apply thin layer on affected areas of toes/feet twice daily for 1 week or until rash is resolved 30 g 0   No current facility-administered medications on file prior to visit.    Family History  Problem Relation Age of Onset  . Diabetes Mother   . Hypertension Mother   . Cancer Mother        breast  . Diabetes Father   . Hypertension Father     Social History   Socioeconomic History  . Marital status: Single    Spouse name: Not on file  . Number of children: Not on file  . Years of education: Not on file  . Highest education level: Not on file  Occupational History  . Not on file  Tobacco Use  . Smoking status: Current Every Day Smoker     Packs/day: 0.10    Years: 27.00    Pack years: 2.70    Types: Cigars  . Smokeless tobacco: Never Used  . Tobacco comment: 4 cigars a day  Vaping Use  . Vaping Use: Never used  Substance and Sexual Activity  . Alcohol use: Yes    Alcohol/week: 0.0 standard drinks    Comment: 1.5 pt liquior day  . Drug use: Not Currently    Comment: Past history of crack cocaine  use   . Sexual activity: Not Currently    Partners: Male    Birth control/protection: Condom    Comment: declined condoms 03/2019  Other Topics Concern  . Not on file  Social History Narrative  . Not on file   Social Determinants of Health   Financial Resource Strain: Not on file  Food Insecurity: Not on file  Transportation Needs: Not on file  Physical Activity: Not on file  Stress: Not on file  Social Connections: Not on file  Intimate Partner Violence: Not on file    Review of Systems: ROS negative except for what is noted on the assessment and plan.  Vitals:   06/11/20 1434  BP: 104/88  Pulse: 76  Temp: 98.8 F (37.1 C)  TempSrc: Oral  SpO2: 100%  Weight: 164 lb (74.4 kg)  Height: 5\' 11"  (1.803 m)     Physical Exam: Constitutional: well-appearing, sitting comfortably, in no acute distress HENT: normocephalic atraumatic. Non tender palpation over frontal/maxially sinus, head, orbital area. Eyes: conjunctiva non-erythematous Neck: supple Cardiovascular: regular rate and rhythm Pulmonary/Chest: normal work of breathing on room air MSK: normal bulk and tone Neurological: alert & oriented x 3 Skin: warm and dry Psych: Normal mood   Assessment & Plan:   See Encounters Tab for problem based charting.  Patient discussed with Dr. Caffie Damme, D.O. [facility], PGY-1 Pager: [phone], Phone: [phone] Date 06/11/2020 Time 6:59 PM

## 2020-06-11 NOTE — Assessment & Plan Note (Signed)
Assessment: Patient is concerned about having syphilous. He recently shared a cigarette and drink with someone who tested positive for syphilous. He denies any sexual activity. Suspect this worry has been contributing to patient's headache.   Reassured patient that syphilous is contacted via contact between chancre and mucosal surfaces.   Plan: -no testing needed at this time -patient reassured syphilous not contracted via sharing drinks/cigarretes.

## 2020-06-11 NOTE — Assessment & Plan Note (Signed)
Assessment: Patient endorses left sided headache over frontal region. Describes pain as constant and dull. Nothing makes it better or worse. Patient tried goodypowder with little relief. Not affected by light. Denied tearing up. Denies trauma or LOC. Denies history of migraines. Not improved with sleep. Patient states this morning the pain was less and was located around his left eye.  Does not follow symptomatic pattern of migraine. Denies neck pain, do not suspect tension headache. Believe combination of recent stressors (patient worried he contracted syphilous) and blurry vision. Patient states he has had blurry vision for the past few months. Will refer to ophthalmology. Instructed patient to take ibuprofen 400 mg q8h for pain.   Plan: -ibuprofen 400mg  q8h for continued pain -ophthalmology referral in place -return to clinic if no improvement

## 2020-06-11 NOTE — Assessment & Plan Note (Signed)
Assessment: Patient endorses blurry vision past few months. States he has difficult time reading and seeing images that are approximately 20 ft or so away.   Plan: -referred to ophthalmology

## 2020-06-11 NOTE — Patient Instructions (Signed)
Thank you, [name] for allowing [place] to provide your care today. Today we discussed  Migraine - Please take two ibuprofen as needed for migraine every 8 hours. Do not take more than that. I will also be referring you to an eye doctor for your blurry vision.   I have ordered the following labs for you:  Lab Orders  No laboratory test(s) ordered today     Referrals ordered today:   Referral Orders  No referral(s) requested today     I have ordered the following medication/changed the following medications:   Stop the following medications: There are no discontinued medications.   Start the following medications: No orders of the defined types were placed in this encounter.    Follow up: As needed    Should you have any questions or concerns please call the internal medicine clinic at [phone].     Jeremiah [name], D.O. [place]

## 2020-06-14 NOTE — Progress Notes (Signed)
Internal Medicine Clinic Attending  Case discussed with Dr. Katsadouros  At the time of the visit.  We reviewed the resident's history and exam and pertinent patient test results.  I agree with the assessment, diagnosis, and plan of care documented in the resident's note.  

## 2020-07-04 ENCOUNTER — Telehealth: Payer: Self-pay

## 2020-07-04 NOTE — Telephone Encounter (Signed)
Requesting to speak with a nurse about having headache and getting ibuprofen. Please call pt back.

## 2020-07-04 NOTE — Telephone Encounter (Signed)
RTC, patient c/o of ongoing, persistant h/a's.  RN asked patient if he has heard from Ophthalmology (referral placed to [facility] on 06/11/20).  Patient states yes, he thinks he has an appt.  RN informed patient that per LOV note, MD wanted him evaluated by Ophthalmology and per notes, if headaches continued he is to f/u in clinic.  Pt asking if he can "have a MRI or something".  Office appt given for 07/09/20 @ 0845/red team/Dr. Gilford Rile for evaluation.   Pt asking for RX for Ibuprofen.  RN informed patient MD will probably want to see him 1st, as he also has a hx of HTN.  Reminded patient Ibuprofen is available OTC. SChaplin, RN,BSN

## 2020-07-09 ENCOUNTER — Telehealth: Payer: Self-pay | Admitting: *Deleted

## 2020-07-09 ENCOUNTER — Encounter: Payer: Medicaid Other | Admitting: Internal Medicine

## 2020-07-09 NOTE — Telephone Encounter (Signed)
Call to patient concerning missed appointment.  Message left that the Clinics had called and to call to reschedule if needed.  Sander Nephew, RN 07/09/2020 9:16 AM.

## 2020-07-24 ENCOUNTER — Telehealth: Payer: Self-pay

## 2020-07-24 ENCOUNTER — Ambulatory Visit (HOSPITAL_COMMUNITY)
Admission: EM | Admit: 2020-07-24 | Discharge: 2020-07-24 | Discharge status: Against Medical Advice | Payer: Medicaid Other

## 2020-07-24 ENCOUNTER — Other Ambulatory Visit: Payer: Self-pay

## 2020-07-24 ENCOUNTER — Other Ambulatory Visit: Payer: Medicaid Other

## 2020-07-24 DIAGNOSIS — B20 Human immunodeficiency virus [HIV] disease: Secondary | ICD-10-CM

## 2020-07-24 DIAGNOSIS — Z79899 Other long term (current) drug therapy: Secondary | ICD-10-CM | POA: Diagnosis not present

## 2020-07-24 NOTE — Telephone Encounter (Signed)
Patient here for labs and requesting blood pressure check as he states he has been having some headaches. Patient's BP 112/76. Patient states he has also been having itchy/watery eyes with headaches and thinks it's probably related to allergies.   Beryle Flock, RN

## 2020-07-24 NOTE — Telephone Encounter (Signed)
Received TC from patient who is requesting an appt this afternoon in clinic.  He c/o pounding h/a X 3-4 hours w/ blurred vision, chest discomfort on the left side of his chest X 2 days, and SOB with activity.  Pt informed he needs to present to ED for evaluation, [facility] does not see any active chest pain/discomfort in clinic.  He verbalized understanding. SChaplin, RN,BSN

## 2020-07-24 NOTE — Telephone Encounter (Signed)
Agree, thank you

## 2020-07-25 LAB — T-HELPER CELL (CD4) - (RCID CLINIC ONLY)
CD4 % Helper T Cell: 23 % — ABNORMAL LOW (ref 33–65)
CD4 T Cell Abs: 274 /uL — ABNORMAL LOW (ref 400–1790)

## 2020-07-26 LAB — LIPID PANEL
Cholesterol: 127 mg/dL (ref <200)
HDL: 55 mg/dL (ref >=40)
LDL Cholesterol (Calc): 47 mg/dL (calc)
Non-HDL Cholesterol (Calc): 72 mg/dL (calc) (ref <130)
Total CHOL/HDL Ratio: 2.3 (calc) (ref <5.0)
Triglycerides: 177 mg/dL — ABNORMAL HIGH (ref <150)

## 2020-07-26 LAB — COMPREHENSIVE METABOLIC PANEL
AG Ratio: 2.1 (calc) (ref 1.0–2.5)
ALT: 20 U/L (ref 9–46)
AST: 33 U/L (ref 10–40)
Albumin: 4.1 g/dL (ref 3.6–5.1)
Alkaline phosphatase (APISO): 51 U/L (ref 36–130)
BUN: 13 mg/dL (ref 7–25)
CO2: 30 mmol/L (ref 20–32)
Calcium: 8.8 mg/dL (ref 8.6–10.3)
Chloride: 106 mmol/L (ref 98–110)
Creat: 1.26 mg/dL (ref 0.60–1.35)
Globulin: 2 g/dL (calc) (ref 1.9–3.7)
Glucose, Bld: 76 mg/dL (ref 65–99)
Potassium: 4.6 mmol/L (ref 3.5–5.3)
Sodium: 141 mmol/L (ref 135–146)
Total Bilirubin: 1.4 mg/dL — ABNORMAL HIGH (ref 0.2–1.2)
Total Protein: 6.1 g/dL (ref 6.1–8.1)

## 2020-07-26 LAB — CBC
HCT: 39.2 % (ref 38.5–50.0)
Hemoglobin: 13.8 g/dL (ref 13.2–17.1)
MCH: 37.2 pg — ABNORMAL HIGH (ref 27.0–33.0)
MCHC: 35.2 g/dL (ref 32.0–36.0)
MCV: 105.7 fL — ABNORMAL HIGH (ref 80.0–100.0)
MPV: 10.1 fL (ref 7.5–12.5)
Platelets: 134 10*3/uL — ABNORMAL LOW (ref 140–400)
RBC: 3.71 10*6/uL — ABNORMAL LOW (ref 4.20–5.80)
RDW: 12.8 % (ref 11.0–15.0)
WBC: 2.8 10*3/uL — ABNORMAL LOW (ref 3.8–10.8)

## 2020-07-26 LAB — FLUORESCENT TREPONEMAL AB(FTA)-IGG-BLD: Fluorescent Treponemal ABS: REACTIVE — AB

## 2020-07-26 LAB — RPR: RPR Ser Ql: REACTIVE — AB

## 2020-07-26 LAB — RPR TITER: RPR Titer: 1:1 {titer} — ABNORMAL HIGH

## 2020-07-26 LAB — HIV-1 RNA QUANT-NO REFLEX-BLD
HIV 1 RNA Quant: NOT DETECTED Copies/mL
HIV-1 RNA Quant, Log: NOT DETECTED Log cps/mL

## 2020-08-07 ENCOUNTER — Encounter: Payer: Medicaid Other | Admitting: Student

## 2020-08-08 ENCOUNTER — Ambulatory Visit (INDEPENDENT_AMBULATORY_CARE_PROVIDER_SITE_OTHER): Payer: Medicaid Other | Admitting: Student

## 2020-08-08 ENCOUNTER — Encounter: Payer: Self-pay | Admitting: Student

## 2020-08-08 ENCOUNTER — Other Ambulatory Visit: Payer: Self-pay

## 2020-08-08 ENCOUNTER — Encounter: Payer: Self-pay | Admitting: Internal Medicine

## 2020-08-08 ENCOUNTER — Encounter: Payer: Medicaid Other | Admitting: Internal Medicine

## 2020-08-08 ENCOUNTER — Ambulatory Visit (INDEPENDENT_AMBULATORY_CARE_PROVIDER_SITE_OTHER): Payer: Medicaid Other | Admitting: Internal Medicine

## 2020-08-08 DIAGNOSIS — W57XXXA Bitten or stung by nonvenomous insect and other nonvenomous arthropods, initial encounter: Secondary | ICD-10-CM

## 2020-08-08 DIAGNOSIS — B20 Human immunodeficiency virus [HIV] disease: Secondary | ICD-10-CM

## 2020-08-08 DIAGNOSIS — S30860A Insect bite (nonvenomous) of lower back and pelvis, initial encounter: Secondary | ICD-10-CM | POA: Diagnosis not present

## 2020-08-08 DIAGNOSIS — F17210 Nicotine dependence, cigarettes, uncomplicated: Secondary | ICD-10-CM | POA: Diagnosis not present

## 2020-08-08 DIAGNOSIS — R519 Headache, unspecified: Secondary | ICD-10-CM | POA: Diagnosis not present

## 2020-08-08 DIAGNOSIS — F330 Major depressive disorder, recurrent, mild: Secondary | ICD-10-CM | POA: Diagnosis not present

## 2020-08-08 HISTORY — DX: Bitten or stung by nonvenomous insect and other nonvenomous arthropods, initial encounter: W57.XXXA

## 2020-08-08 NOTE — Assessment & Plan Note (Signed)
He has chronic depression with anxiety.  We will schedule him to meet with our behavioral health counselor.

## 2020-08-08 NOTE — Assessment & Plan Note (Signed)
I urged him to do what ever necessary to cut down and quit smoking.

## 2020-08-08 NOTE — Progress Notes (Signed)
Patient Active Problem List   Diagnosis Date Noted  . 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[place]) 01/06/2018    Priority: High  . Anal cancer ([place]) 11/07/2015    Priority: Medium  . Human immunodeficiency virus (HIV) disease ([place]) 05/19/2013    Priority: Medium  . Tick bite 08/08/2020  . Headache 06/11/2020  . Exposure to syphilis 06/11/2020  . Blurry vision, bilateral 06/11/2020  . S/P laparoscopic appendectomy 01/10/2020  . Right sided abdominal pain 01/09/2020  . Anal fissure 08/02/2019  . Foot pain, left 05/20/2019  . Hypertension 02/21/2019  . Screening for diabetes mellitus 02/21/2019  . Anxiety 09/29/2018  . Lung nodule 06/23/2018  . Dyslipidemia 05/19/2013  . Cigarette smoker 05/19/2013  . Depression 05/19/2013  . Dental caries 05/19/2013  . Hx of unilateral orchiectomy 05/19/2013  . Latent syphilis 05/06/2013  . Anal warts 05/06/2013  . History of chlamydia 05/06/2013    Patient's Medications  New Prescriptions   No medications on file  Previous Medications   ALBUTEROL (VENTOLIN HFA) 108 (90 BASE) MCG/ACT INHALER    Inhale 1-2 puffs into the lungs every 6 (six) hours as needed for wheezing or shortness of breath.   BICTEGRAVIR-EMTRICITABINE-TENOFOVIR AF (BIKTARVY) 50-200-25 MG TABS TABLET    Take 1 tablet by mouth daily.   CLOTRIMAZOLE 1 % OINT    Apply thin layer on affected areas of toes/feet twice daily for 1 week or until rash is resolved  Modified Medications   No medications on file  Discontinued Medications   No medications on file    Subjective: Jeremiah [name] is in for his routine HIV follow-up visit.  He recently missed 2-3 doses of Biktarvy when he was late calling in his refill.  He says that he has been very stressed with slight depression and anxiety due to inability to find a steady work.  He has been doing some odd jobs painting.  He is still smoking about 6 cigars daily.  He is having problems with worsening shortness of breath, occasional wheezing and  productive cough, especially in the morning.  He says that he pulled a small, not engorged tick off his left groin 3 days ago.  He has had some itching and swelling at the site.  He has not had any fever or recent rash.  Review of Systems: Review of Systems  Constitutional: Negative for chills, diaphoresis, fever and weight loss.  Respiratory: Positive for cough, sputum production, shortness of breath and wheezing.   Cardiovascular: Negative for chest pain.       He has recently had 1 area of numbness around his lower ribs on the right side anteriorly.  Gastrointestinal: Negative for abdominal pain, diarrhea, nausea and vomiting.  Musculoskeletal: Negative for back pain and joint pain.  Neurological: Positive for headaches.  Psychiatric/Behavioral: Positive for depression. The patient is nervous/anxious.     Past Medical History:  Diagnosis Date  . Anal cancer ([place])   . Anal intraepithelial neoplasia I (AIN I)   . Anal warts   . Appendicitis   . Carpal tunnel syndrome    Right  . COPD (chronic obstructive pulmonary disease) ([place])   . Depression   . Emphysema lung ([place])   . Hemorrhoids 02/25/2015  . Hepatic steatosis 2021  . History of tuberculin skin testing within last year 2014  . HIV (human immunodeficiency virus infection) ([place])   . Hypertension   . Internal hemorrhoids   . Latent syphilis   . Lung nodule  Right upper lobe  . Pneumonia 2007   x2  . Pneumothorax 07/01/2018   LEFT  . Rectal cancer ([place])   . Tinea pedis     Social History   Tobacco Use  . Smoking status: Current Every Day Smoker    Packs/day: 0.10    Years: 27.00    Pack years: 2.70    Types: Cigars  . Smokeless tobacco: Never Used  . Tobacco comment: 4 cigars a day  Vaping Use  . Vaping Use: Never used  Substance Use Topics  . Alcohol use: Yes    Alcohol/week: 0.0 standard drinks    Comment: 1.5 pt liquior day  . Drug use: Not Currently    Comment: Past history of crack cocaine  use      Family History  Problem Relation Age of Onset  . Diabetes Mother   . Hypertension Mother   . Cancer Mother        breast  . Diabetes Father   . Hypertension Father     Allergies  Allergen Reactions  . Nsaids Other (See Comments)    DRUG INTERACTION WITH HIV MEDS  . Sulfa Antibiotics Itching    Health Maintenance  Topic Date Due  . COLONOSCOPY (Pts 45-52yrs Insurance coverage will need to be confirmed)  Never done  . COVID-19 Vaccine (2 - Pfizer risk 4-dose series) 03/06/2020  . INFLUENZA VACCINE  11/19/2020  . TETANUS/TDAP  05/19/2029  . Hepatitis C Screening  Completed  . HIV Screening  Completed  . HPV VACCINES  Aged Out    Objective:  Vitals:   08/08/20 1047  BP: (!) 152/105  Pulse: 64  Temp: 97.7 F (36.5 C)  TempSrc: Oral  SpO2: 97%  Weight: 156 lb 3.2 oz (70.9 kg)   Body mass index is 21.79 kg/m.  Physical Exam Constitutional:      Comments: He is calm and pleasant today.  Cardiovascular:     Rate and Rhythm: Normal rate and regular rhythm.     Heart sounds: No murmur heard.   Pulmonary:     Effort: Pulmonary effort is normal.     Breath sounds: No wheezing, rhonchi or rales.     Comments: He has distant breath sounds. Abdominal:     Palpations: Abdomen is soft.     Tenderness: There is no abdominal tenderness.  Skin:    Comments: He shows me where the tick was attached in his left groin.  There is some induration in the area but no redness or drainage.  Psychiatric:        Mood and Affect: Mood normal.     Lab Results Lab Results  Component Value Date   WBC 2.8 (L) 07/24/2020   HGB 13.8 07/24/2020   HCT 39.2 07/24/2020   MCV 105.7 (H) 07/24/2020   PLT 134 (L) 07/24/2020    Lab Results  Component Value Date   CREATININE 1.26 07/24/2020   BUN 13 07/24/2020   NA 141 07/24/2020   K 4.6 07/24/2020   CL 106 07/24/2020   CO2 30 07/24/2020    Lab Results  Component Value Date   ALT 20 07/24/2020   AST 33 07/24/2020   ALKPHOS  51 01/10/2020   BILITOT 1.4 (H) 07/24/2020    Lab Results  Component Value Date   CHOL 127 07/24/2020   HDL 55 07/24/2020   LDLCALC 47 07/24/2020   TRIG 177 (H) 07/24/2020   CHOLHDL 2.3 07/24/2020   Lab Results  Component Value  Date   LABRPR REACTIVE (A) 07/24/2020   RPRTITER 1:1 (H) 07/24/2020   HIV 1 RNA Quant  Date Value  07/24/2020 Not Detected Copies/mL  01/10/2020 <20 copies/mL  06/20/2019 <20 NOT DETECTED copies/mL   CD4 T Cell Abs (/uL)  Date Value  07/24/2020 274 (L)  02/14/2020 253 (L)  01/10/2020 187 (L)     Problem List Items Addressed This Visit      Medium   Human immunodeficiency virus (HIV) disease ([place])    His infection remains under excellent long-term control.  His CD4 count remains below normal which is likely to be a result of previous chemotherapy for anal cancer.  He will continue Biktarvy and follow-up after lab work in 6 months.      Relevant Orders   T-helper cell (CD4)- ([facility] clinic only)   HIV-1 RNA quant-no reflex-bld     Unprioritized   Cigarette smoker    I urged him to do what ever necessary to cut down and quit smoking.      Depression    He has chronic depression with anxiety.  We will schedule him to meet with our behavioral health counselor.      Tick bite    He has developed a nonspecific local reaction to a recent tick bite.  He does not need antibiotic therapy for this.           Michel Bickers, MD [facility] [phone] pager   [phone] cell 08/08/2020, 11:07 AM

## 2020-08-08 NOTE — Assessment & Plan Note (Signed)
His infection remains under excellent long-term control.  His CD4 count remains below normal which is likely to be a result of previous chemotherapy for anal cancer.  He will continue Biktarvy and follow-up after lab work in 6 months.

## 2020-08-08 NOTE — Assessment & Plan Note (Signed)
He has developed a nonspecific local reaction to a recent tick bite.  He does not need antibiotic therapy for this.

## 2020-08-08 NOTE — Patient Instructions (Signed)
It was a pleasure seeing you in clinic. Today we discussed:   Headaches: Please set up an appointment with the eye doctor. You can try tylenol as needed for the pain. I would avoid taking medications as this may cause worsening headache. Please avoid taking goody powder or ibuprofen daily.  Follow up if headache worsens or does not improvr  If you have any questions or concerns, please call our clinic at [phone] between 9am-5pm and after hours call [phone] and ask for the internal medicine resident on call. If you feel you are having a medical emergency please call 911.   Thank you, we look forward to helping you remain healthy!

## 2020-08-09 NOTE — Assessment & Plan Note (Signed)
Patient report continued daily headache. States pain is constant dull mostly around the left eye and lasts all day. Ibuprofen helped the pain but ran out and cannot afford more. Notes he continues to have right eye blurriness. Pain does not interfere with work or adls. Denies any exacerbating factors. Possible this is a tension headache vs eye strain. Missed appointment with ophthalmology. Encourage he make a follow up. Counseled that we would not recommend daily ibuprofen for this. Will have him try over the counter tylenol for this.   Plan Tylenol as needed for pain Follow up with ophthalmology

## 2020-08-09 NOTE — Assessment & Plan Note (Signed)
Patient presents for tick bite 4 days ago after going fishing. The next morning found a tick and two bites in the left inguinal area. Reports pain and swelling since then. Small local reaction on exam no other tick bits or tick on exam. Reassured patient that unlikely to have lyme transmission given history. Discussed checking for tick in the evening especially after being outside around tall grass.

## 2020-08-09 NOTE — Progress Notes (Signed)
   CC: tick bite  HPI:  Jeremiah [name] is a 50 y.o. male with history below presents for tick bite 4 days ago. Please refer to problem based charting for further details and assessment and plan of current problem and chronic medical conditions.   Past Medical History:  Diagnosis Date  . Anal cancer ([place])   . Anal intraepithelial neoplasia I (AIN I)   . Anal warts   . Appendicitis   . Carpal tunnel syndrome    Right  . COPD (chronic obstructive pulmonary disease) ([place])   . Depression   . Emphysema lung ([place])   . Hemorrhoids 02/25/2015  . Hepatic steatosis 2021  . History of tuberculin skin testing within last year 2014  . HIV (human immunodeficiency virus infection) ([place])   . Hypertension   . Internal hemorrhoids   . Latent syphilis   . Lung nodule    Right upper lobe  . Pneumonia 2007   x2  . Pneumothorax 07/01/2018   LEFT  . Rectal cancer ([place])   . Tinea pedis    Review of Systems:  Negative as per HPI  Physical Exam:  Vitals:   08/08/20 0908  BP: (!) 127/95  Pulse: 72  Temp: 98.6 F (37 C)  SpO2: 100%  Weight: 157 lb 6.4 oz (71.4 kg)  Height: 5\' 11"  (1.803 m)   Constitutional: Appears well-developed and well-nourished. No distress.  HNT: Normocephalic and atraumatic,  moist mucous membranes Eyes: EMOI, PERRLA, no papilledema or optic upping of the right eye, unable to visualize of the left Cardiovascular: Normal rate, regular rhythm, S1 and S2 present, no murmurs, rubs, gallops.  Distal pulses intact Respiratory: No respiratory distress, no accessory muscle use.  Effort is normal.  Lungs are clear to auscultation bilaterally. GI: Nondistended, soft, nontender to palpation, normal active bowel sounds Musculoskeletal: Normal bulk and tone.  No peripheral edema noted. Neurological: Is alert and oriented x4, no apparent focal deficits noted. Skin: 2 small puncture marks on the left inguinal area with small amount of surrounding induration c/w insect bite  reaction, no erythema, no rashes, no ticks Psychiatric: Normal mood and affect. Behavior is normal. Judgment and thought content normal.    Assessment & Plan:   See Encounters Tab for problem based charting.  Patient discussed with Dr. Jimmye Norman

## 2020-08-10 ENCOUNTER — Encounter: Payer: Self-pay | Admitting: Pulmonary Disease

## 2020-08-10 ENCOUNTER — Ambulatory Visit (INDEPENDENT_AMBULATORY_CARE_PROVIDER_SITE_OTHER): Payer: Medicaid Other | Admitting: Pulmonary Disease

## 2020-08-10 ENCOUNTER — Other Ambulatory Visit: Payer: Self-pay

## 2020-08-10 VITALS — BP 130/74 | HR 68 | Temp 98.5°F | Ht 71.0 in | Wt 159.4 lb

## 2020-08-10 DIAGNOSIS — R911 Solitary pulmonary nodule: Secondary | ICD-10-CM

## 2020-08-10 DIAGNOSIS — J439 Emphysema, unspecified: Secondary | ICD-10-CM

## 2020-08-10 DIAGNOSIS — F17210 Nicotine dependence, cigarettes, uncomplicated: Secondary | ICD-10-CM

## 2020-08-10 MED ORDER — CHANTIX STARTING MONTH PAK 0.5 MG X 11 & 1 MG X 42 PO TABS: ORAL_TABLET | ORAL | 0 refills | Status: DC

## 2020-08-10 MED ORDER — TRELEGY ELLIPTA 100-62.5-25 MCG/INH IN AEPB: 1.0000 | INHALATION_SPRAY | Freq: Every day | RESPIRATORY_TRACT | 2 refills | Status: DC

## 2020-08-10 MED ORDER — TRELEGY ELLIPTA 100-62.5-25 MCG/INH IN AEPB: 1.0000 | INHALATION_SPRAY | Freq: Every day | RESPIRATORY_TRACT | 0 refills | Status: DC

## 2020-08-10 NOTE — Patient Instructions (Signed)
We will start you on an inhaler called Trelegy and give you samples Refer for low-dose screening CT of the chest Prescribe nicotine patches and Chantix for smoking cessation  Follow-up in 6 months

## 2020-08-10 NOTE — Addendum Note (Signed)
Addended by: Elton Sin on: 08/10/2020 10:47 AM   Modules accepted: Orders

## 2020-08-10 NOTE — Progress Notes (Signed)
Jeremiah [name]    [id number]    [birth year]  Primary Care Physician:[place], Charisse March, MD  Referring Physician: Lacinda Axon, MD [address] [place],  [place] [address]  Chief complaint: Follow-up for lung nodules, emphysema  HPI: 50 year old active smoker with HIV, anal cancer.  Referred for abnormal CT with right lung nodule  Diagnosed with anal cancer in November of 2018 and underwent chemotherapy, radiation at [facility] in [place].  Noted to have lung nodules on follow-up in March 2019 in [place] and surveillance recommended.  He has subsequently had CT scan and PET scan follow-up at [facility] this month which showed an FDG avid 1.7 cm nodule in the right upper lobe.   Underwent right upper lobe wedge resection of lung nodule in March 2020 which turned out to be granulomatous inflammation.  Postop developed hydropneumothorax requiring chest tube placement He subsequently developed right middle lobe spiculated opacity noted on CT scan May 2020 which is being followed by serial CTs.  The nodule has overall improved in size.  30-pack-year smoker and continues to smoke 1 pack/day.    Interim history: Continues to have occasional dyspnea with congestion.  He is ready to try inhalers Continues to smoke   Outpatient Encounter Medications as of 08/10/2020  Medication Sig  . albuterol (VENTOLIN HFA) 108 (90 Base) MCG/ACT inhaler Inhale 1-2 puffs into the lungs every 6 (six) hours as needed for wheezing or shortness of breath.  . bictegravir-emtricitabine-tenofovir AF (BIKTARVY) 50-200-25 MG TABS tablet Take 1 tablet by mouth daily.  . Clotrimazole 1 % OINT Apply thin layer on affected areas of toes/feet twice daily for 1 week or until rash is resolved   No facility-administered encounter medications on file as of 08/10/2020.   Physical Exam: Blood pressure 130/74, pulse 68, temperature 98.5 F (36.9 C), temperature source Temporal, height 5\' 11"   (1.803 m), weight 159 lb 6.4 oz (72.3 kg), SpO2 99 %. Gen:      No acute distress HEENT:  EOMI, sclera anicteric Neck:     No masses; no thyromegaly Lungs:    Clear to auscultation bilaterally; normal respiratory effort CV:         Regular rate and rhythm; no murmurs Abd:      + bowel sounds; soft, non-tender; no palpable masses, no distension Ext:    No edema; adequate peripheral perfusion Skin:      Warm and dry; no rash Neuro: alert and oriented x 3 Psych: normal mood and affect  Data Reviewed: Imaging: CT chest, abdomen pelvis 05/22/2018- 1.7 cm right upper lobe nodule, 2.1 cm right hilar lymph node.  No significant abnormality in the abdomen or pelvis.  PET scan 06/08/2018- FDG uptake in right upper lobe lung nodule, SUV 4.04.  No additional uptake elsewhere in the body or in the mediastinum.  Mild nonspecific uptake in the fat at the level of anus.  CT chest 09/07/2018-new masslike opacity in the right middle lobe.  Emphysema, coronary atherosclerosis  CT chest 12/31/2018-decrease in size of right middle lobe.  Emphysema  CT chest 03/24/2019-new decrease in size of right middle lobe nodule.  Emphysema.  CT chest 10/18/2019-6 mm right middle lobe nodule nodule slightly decreased, postsurgical changes in right, emphysema I have reviewed the images personally.  PFTs: 06/10/2018-FVC 5.01 [113%], FEV1 3.83 [108%], F/F 76, TLC 6.65 [93%], DLCO 23.8 [77%] Minimal obstructive airway disease.  Minimal diffusion defect.  No restriction.  Labs: CBC 01/10/2020-WBC 5.4,  eos 5%, absolute eosinophil count 270 Alpha-1 antitrypsin 04/05/2019-90, PI MM  Pathology 06/23/2018 Necrotizing inflammation with fibrosis.  Lymph nodes negative for malignancy  Assessment:  Lung nodules He has resection of right upper lobe lung nodule with path showing necrotizing granulomatous inflammation on pathology with no evidence of malignancy Subsequently developed right middle lobe spiculated nodule that is improving  on follow-up CT scans.  Suspect resolving scar.    Emphysema. Has significant emphysematous changes on CT scan.  PFTs do not show overt obstruction But he is symptomatic with dyspnea.  Start Trelegy inhaler  Active smoker Discussed smoking cessation.  Retry nicotine patches and Chantix Time spent counseling-5 minutes.  Reassess at return visit  Refer for low-dose screening CT of the chest to start July 2022  Plan/Recommendations: - Trelegy inhaler - Start annual low-dose screening CTs - Smoking cessation with nicotine and Chantix  Marshell Garfinkel MD [facility] 08/10/2020, 10:31 AM  CC: Lacinda Axon, MD

## 2020-08-13 NOTE — Progress Notes (Signed)
Internal Medicine Clinic Attending  Case discussed with Dr. Lisabeth Devoid  At the time of the visit.  We reviewed the resident's history and exam and pertinent patient test results.  I agree with the assessment, diagnosis, and plan of care documented in the resident's note. Note headaches precede the tickbites - not related.

## 2020-08-14 ENCOUNTER — Telehealth: Payer: Self-pay | Admitting: Pulmonary Disease

## 2020-08-14 NOTE — Telephone Encounter (Signed)
Pharmacy name: [facility] [address] requested: Trelegy 100 CMM?: yes Key: [id number] Covered alternatives: advair, dulera, symbicort Tried and failed: on file Decision: sent to plan- determination expected within 3 days.   Routing to Zena for follow-up.

## 2020-08-15 NOTE — Telephone Encounter (Signed)
Checked CMM for PA determination. At this time PA is still pending and states may take up to 72 hours for determination.

## 2020-08-16 NOTE — Telephone Encounter (Signed)
Checked status, PA has been denied. Pt must try and fail one of the following: Advair, Dulera, Symbicort.  Pt has not tried any of these meds per chart.   [name] please advise, thanks!

## 2020-08-17 NOTE — Telephone Encounter (Signed)
Tried calling the pt and there was no answer- LMTCB.  

## 2020-08-17 NOTE — Telephone Encounter (Signed)
Please try Symbicort 160/4.5

## 2020-08-20 MED ORDER — BUDESONIDE-FORMOTEROL FUMARATE 160-4.5 MCG/ACT IN AERO: 2.0000 | INHALATION_SPRAY | Freq: Two times a day (BID) | RESPIRATORY_TRACT | 5 refills | Status: DC

## 2020-08-20 NOTE — Telephone Encounter (Signed)
Called and spoke with Patient.  Patient aware Symbicort prescription sent to pharamcy. Patient asked if he could finish his Trelegy sample. Patient instructed to continue Trelegy one puff a day sample and then  start  Symbicort 160 two puffs, twice a day. Understanding stated. Nothing further at this time.

## 2020-09-01 ENCOUNTER — Other Ambulatory Visit: Payer: Self-pay | Admitting: Internal Medicine

## 2020-09-01 DIAGNOSIS — B20 Human immunodeficiency virus [HIV] disease: Secondary | ICD-10-CM

## 2020-10-01 ENCOUNTER — Other Ambulatory Visit: Payer: Self-pay | Admitting: Infectious Diseases

## 2020-10-01 DIAGNOSIS — B20 Human immunodeficiency virus [HIV] disease: Secondary | ICD-10-CM

## 2020-10-16 ENCOUNTER — Encounter (HOSPITAL_COMMUNITY): Payer: Self-pay | Admitting: Emergency Medicine

## 2020-10-16 ENCOUNTER — Other Ambulatory Visit: Payer: Self-pay

## 2020-10-16 ENCOUNTER — Ambulatory Visit (HOSPITAL_COMMUNITY)
Admission: EM | Admit: 2020-10-16 | Discharge: 2020-10-16 | Disposition: A | Discharge status: Home / Self Care | Payer: Medicaid Other | Attending: Family Medicine | Admitting: Family Medicine

## 2020-10-16 DIAGNOSIS — J069 Acute upper respiratory infection, unspecified: Secondary | ICD-10-CM | POA: Diagnosis not present

## 2020-10-16 DIAGNOSIS — J441 Chronic obstructive pulmonary disease with (acute) exacerbation: Secondary | ICD-10-CM

## 2020-10-16 DIAGNOSIS — B20 Human immunodeficiency virus [HIV] disease: Secondary | ICD-10-CM

## 2020-10-16 MED ORDER — PREDNISONE 20 MG PO TABS: 40.0000 mg | ORAL_TABLET | Freq: Every day | ORAL | 0 refills | Status: DC

## 2020-10-16 MED ORDER — GUAIFENESIN ER 600 MG PO TB12: 600.0000 mg | ORAL_TABLET | Freq: Two times a day (BID) | ORAL | 0 refills | Status: DC | PRN

## 2020-10-16 NOTE — Discharge Instructions (Addendum)
Go to the emergency room if your symptoms worsen at any time

## 2020-10-16 NOTE — ED Triage Notes (Signed)
Pt is present today with a cough, left arm pain and chest pain on the left side. Pt states that last night his chest pain felt sharp. Pt denies any active chest pain now he states that his chest just feels numb.

## 2020-10-16 NOTE — ED Provider Notes (Signed)
[place]    CSN: [id number] Arrival date & time: 10/16/20  1054      History   Chief Complaint Chief Complaint  Patient presents with   Cough    HPI Jeremiah [name] is a 50 y.o. male.   Patient presenting today with cough, chest tightness, chest pain with deep breathing or coughing, malaise, congestion.  Denies known fever, chills, body aches, abdominal pain, nausea vomiting or diarrhea.  History of COPD with frequent exacerbations, history of pneumonia, right lung nodule, HIV on Biktarvy.  Has an albuterol inhaler at home but otherwise does not take anything for symptoms.  No known history of ischemic cardiac issues.  No known sick contacts.    Past Medical History:  Diagnosis Date   Anal cancer ([place])    Anal intraepithelial neoplasia I (AIN I)    Anal warts    Appendicitis    Carpal tunnel syndrome    Right   COPD (chronic obstructive pulmonary disease) (HCC)    Depression    Emphysema lung ([place])    Hemorrhoids 02/25/2015   Hepatic steatosis 2021   History of tuberculin skin testing within last year 2014   HIV (human immunodeficiency virus infection) ([place])    Hypertension    Internal hemorrhoids    Latent syphilis    Lung nodule    Right upper lobe   Pneumonia 2007   x2   Pneumothorax 07/01/2018   LEFT   Rectal cancer ([place])    Tinea pedis     Patient Active Problem List   Diagnosis Date Noted   Tick bite 08/08/2020   Headache 06/11/2020   Exposure to syphilis 06/11/2020   Blurry vision, bilateral 06/11/2020   S/P laparoscopic appendectomy 01/10/2020   Right sided abdominal pain 01/09/2020   Anal fissure 08/02/2019   Foot pain, left 05/20/2019   Hypertension 02/21/2019   Screening for diabetes mellitus 02/21/2019   Anxiety 09/29/2018   Lung nodule 06/23/2018   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[place]) 01/06/2018   Anal cancer ([place]) 11/07/2015   Human immunodeficiency virus (HIV) disease ([place]) 05/19/2013   Dyslipidemia 05/19/2013   Cigarette smoker  05/19/2013   Depression 05/19/2013   Dental caries 05/19/2013   Hx of unilateral orchiectomy 05/19/2013   Latent syphilis 05/06/2013   Anal warts 05/06/2013   History of chlamydia 05/06/2013    Past Surgical History:  Procedure Laterality Date   CHEST TUBE INSERTION ([place] HX) Left 06/30/2018   CONDYLOMA EXCISION/FULGURATION     HERNIA REPAIR  1991   Abdominal    IR RADIOLOGIST EVAL & MGMT  05/17/2019   IR RADIOLOGY PERIPHERAL GUIDED IV START  05/20/2019   IR TRANSCATH RETRIEVAL FB INCL GUIDANCE (MS)  05/20/2019   IR US GUIDE VASC ACCESS RIGHT  05/20/2019   LAPAROSCOPIC APPENDECTOMY N/A 01/11/2020   Procedure: APPENDECTOMY LAPAROSCOPIC;  Surgeon: Coralie Keens, MD;  Location: [facility];  Service: General;  Laterality: N/A;   PORT-A-CATH REMOVAL N/A 05/11/2019   Procedure: PARTIAL PORT REMOVAL;  Surgeon: Leighton Ruff, MD;  Location: [facility];  Service: General;  Laterality: N/A;   unilateral orchiectomy     VIDEO ASSISTED THORACOSCOPY (VATS)/WEDGE RESECTION Right 06/23/2018   Procedure: VIDEO ASSISTED THORACOSCOPY (VATS)/LUNG RESECTION, stapling and dissection of apical bleb, wedge resection of right upper lobe mass, lymph node dissection, intercostal nerve block;  Surgeon: Grace Isaac, MD;  Location: [facility];  Service: Thoracic;  Laterality: Right;   VIDEO BRONCHOSCOPY N/A 06/23/2018   Procedure: VIDEO BRONCHOSCOPY;  Surgeon:  Grace Isaac, MD;  Location: [facility] OR;  Service: Thoracic;  Laterality: N/A;       Home Medications    Prior to Admission medications   Medication Sig Start Date End Date Taking? Authorizing Provider  guaiFENesin (MUCINEX) 600 MG 12 hr tablet Take 1 tablet (600 mg total) by mouth 2 (two) times daily as needed. 10/16/20  Yes Volney American, PA-C  predniSONE (DELTASONE) 20 MG tablet Take 2 tablets (40 mg total) by mouth daily with breakfast. 10/16/20  Yes Volney American, PA-C  albuterol (VENTOLIN HFA) 108 (90 Base) MCG/ACT  inhaler Inhale 1-2 puffs into the lungs every 6 (six) hours as needed for wheezing or shortness of breath. 05/21/20   Lacinda Axon, MD  benzonatate (TESSALON) 100 MG capsule Take 1-2 capsules (100-200 mg total) by mouth 3 (three) times daily as needed for cough. 10/18/20   Jaynee Eagles, PA-C  bictegravir-emtricitabine-tenofovir AF (BIKTARVY) 50-200-25 MG TABS tablet Take 1 tablet by mouth daily. 02/14/20   Michel Bickers, MD  budesonide-formoterol [facility]) 160-4.5 MCG/ACT inhaler Inhale 2 puffs into the lungs in the morning and at bedtime. 08/20/20   Marshell Garfinkel, MD  cetirizine (ZYRTEC ALLERGY) 10 MG tablet Take 1 tablet (10 mg total) by mouth daily. 10/18/20   Jaynee Eagles, PA-C  Clotrimazole 1 % OINT Apply thin layer on affected areas of toes/feet twice daily for 1 week or until rash is resolved 01/10/20   Little, Wenda Overland, MD  promethazine-dextromethorphan (PROMETHAZINE-DM) 6.25-15 MG/5ML syrup Take 5 mLs by mouth at bedtime as needed for cough. 10/18/20   Jaynee Eagles, PA-C  varenicline (CHANTIX STARTING MONTH PAK) 0.5 MG X 11 & 1 MG X 42 tablet Take 1 0.5 mg tablet once daily for 3 days, increase to 1 0.5 mg tablet twice daily for 4 days, increase to 1 1 mg tablet twice daily. 08/10/20   Marshell Garfinkel, MD    Family History Family History  Problem Relation Age of Onset   Diabetes Mother    Hypertension Mother    Cancer Mother        breast   Diabetes Father    Hypertension Father     Social History Social History   Tobacco Use   Smoking status: Every Day    Packs/day: 0.10    Years: 27.00    Pack years: 2.70    Types: Cigars, Cigarettes   Smokeless tobacco: Never   Tobacco comments:    4 cigars a day-08/10/20  Vaping Use   Vaping Use: Never used  Substance Use Topics   Alcohol use: Yes    Alcohol/week: 0.0 standard drinks    Comment: 1.5 pt liquior day   Drug use: Not Currently    Comment: Past history of crack cocaine  use      Allergies   Nsaids and Sulfa  antibiotics   Review of Systems Review of Systems Per HPI  Physical Exam Triage Vital Signs ED Triage Vitals  Enc Vitals Group     BP 10/16/20 1142 117/79     Pulse Rate 10/16/20 1142 70     Resp 10/16/20 1142 18     Temp 10/16/20 1142 98.5 F (36.9 C)     Temp Source 10/16/20 1142 Oral     SpO2 10/16/20 1142 98 %     Weight --      Height --      Head Circumference --      Peak Flow --  Pain Score 10/16/20 1145 0     Pain Loc --      Pain Edu? --      Excl. in [place]? --    No data found.  Updated Vital Signs BP 117/79 (BP Location: Left Arm)   Pulse 70   Temp 98.5 F (36.9 C) (Oral)   Resp 18   SpO2 98%   Visual Acuity Right Eye Distance:   Left Eye Distance:   Bilateral Distance:    Right Eye Near:   Left Eye Near:    Bilateral Near:     Physical Exam Vitals and nursing note reviewed.  Constitutional:      Appearance: Normal appearance.  HENT:     Head: Atraumatic.     Right Ear: Tympanic membrane normal.     Left Ear: Tympanic membrane normal.     Nose: Nose normal.     Mouth/Throat:     Mouth: Mucous membranes are moist.     Pharynx: Posterior oropharyngeal erythema present. No oropharyngeal exudate.  Eyes:     Extraocular Movements: Extraocular movements intact.     Conjunctiva/sclera: Conjunctivae normal.  Cardiovascular:     Rate and Rhythm: Normal rate and regular rhythm.  Pulmonary:     Effort: Pulmonary effort is normal. No respiratory distress.     Breath sounds: Wheezing present. No rhonchi or rales.     Comments: Moderate diffuse wheezes bilaterally Musculoskeletal:        General: Normal range of motion.     Cervical back: Normal range of motion and neck supple.  Skin:    General: Skin is warm and dry.  Neurological:     General: No focal deficit present.     Mental Status: He is oriented to person, place, and time.  Psychiatric:        Mood and Affect: Mood normal.        Thought Content: Thought content normal.         Judgment: Judgment normal.   UC Treatments / Results  Labs (all labs ordered are listed, but only abnormal results are displayed) Labs Reviewed - No data to display  EKG   Radiology DG Chest 2 View  Result Date: 10/18/2020 CLINICAL DATA:  Cough and shortness of breath EXAM: CHEST - 2 VIEW COMPARISON:  Chest radiograph October 01, 2019; chest CT October 10, 2019 FINDINGS: There is postoperative change with scarring in the right apex region. There is chronic blunting of the right costophrenic angle. No edema or airspace opacity. Heart size and pulmonary vascularity are normal. No adenopathy. No bone lesions. IMPRESSION: Scarring with postoperative change right apex region. Chronic scarring with pleural thickening right base. No edema or airspace opacity. Heart size normal. Electronically Signed   By: Lowella Grip III M.D.   On: 10/18/2020 13:39    Procedures Procedures (including critical care time)  Medications Ordered in UC Medications - No data to display  Initial Impression / Assessment and Plan / UC Course  I have reviewed the triage vital signs and the nursing notes.  Pertinent labs & imaging results that were available during my care of the patient were reviewed by me and considered in my medical decision making (see chart for details).     EKG showing mild bradycardia but otherwise no acute ST or T wave changes.  Vital signs very reassuring today, exam overall reassuring aside from some wheezes and pharyngeal erythema.  Suspect viral illness causing a COPD exacerbation to the cause of  his symptoms.  We will treat with prednisone, Mucinex, continued inhaler regimen.  Close PCP follow-up recommended in the next few days.  Go to the ED if symptoms worsen at any time.  Patient agreeable to plan. Final Clinical Impressions(s) / UC Diagnoses   Final diagnoses:  Viral URI with cough  COPD exacerbation [facility])     Discharge Instructions      Go to the emergency room if your  symptoms worsen at any time     ED Prescriptions     Medication Sig Dispense Auth. Provider   predniSONE (DELTASONE) 20 MG tablet Take 2 tablets (40 mg total) by mouth daily with breakfast. 10 tablet Volney American, PA-C   guaiFENesin (MUCINEX) 600 MG 12 hr tablet Take 1 tablet (600 mg total) by mouth 2 (two) times daily as needed. 30 tablet Volney American, [place]      PDMP not reviewed this encounter.   Volney American, [place] 10/19/20 1151

## 2020-10-17 ENCOUNTER — Other Ambulatory Visit: Payer: Self-pay | Admitting: Family

## 2020-10-17 DIAGNOSIS — B20 Human immunodeficiency virus [HIV] disease: Secondary | ICD-10-CM

## 2020-10-18 ENCOUNTER — Telehealth: Payer: Self-pay

## 2020-10-18 ENCOUNTER — Ambulatory Visit (INDEPENDENT_AMBULATORY_CARE_PROVIDER_SITE_OTHER): Payer: Medicaid Other

## 2020-10-18 ENCOUNTER — Other Ambulatory Visit: Payer: Self-pay

## 2020-10-18 ENCOUNTER — Ambulatory Visit (HOSPITAL_COMMUNITY)
Admission: EM | Admit: 2020-10-18 | Discharge: 2020-10-18 | Disposition: A | Discharge status: Home / Self Care | Payer: Medicaid Other | Attending: Urgent Care | Admitting: Urgent Care

## 2020-10-18 ENCOUNTER — Encounter (HOSPITAL_COMMUNITY): Payer: Self-pay

## 2020-10-18 DIAGNOSIS — Z21 Asymptomatic human immunodeficiency virus [HIV] infection status: Secondary | ICD-10-CM | POA: Diagnosis not present

## 2020-10-18 DIAGNOSIS — F17290 Nicotine dependence, other tobacco product, uncomplicated: Secondary | ICD-10-CM | POA: Insufficient documentation

## 2020-10-18 DIAGNOSIS — R059 Cough, unspecified: Secondary | ICD-10-CM

## 2020-10-18 DIAGNOSIS — R0789 Other chest pain: Secondary | ICD-10-CM

## 2020-10-18 DIAGNOSIS — J438 Other emphysema: Secondary | ICD-10-CM | POA: Diagnosis not present

## 2020-10-18 DIAGNOSIS — Z79899 Other long term (current) drug therapy: Secondary | ICD-10-CM | POA: Diagnosis not present

## 2020-10-18 DIAGNOSIS — R079 Chest pain, unspecified: Secondary | ICD-10-CM | POA: Insufficient documentation

## 2020-10-18 DIAGNOSIS — Z7952 Long term (current) use of systemic steroids: Secondary | ICD-10-CM | POA: Insufficient documentation

## 2020-10-18 DIAGNOSIS — R0602 Shortness of breath: Secondary | ICD-10-CM | POA: Diagnosis not present

## 2020-10-18 DIAGNOSIS — Z886 Allergy status to analgesic agent status: Secondary | ICD-10-CM | POA: Diagnosis not present

## 2020-10-18 DIAGNOSIS — Z20822 Contact with and (suspected) exposure to covid-19: Secondary | ICD-10-CM | POA: Insufficient documentation

## 2020-10-18 DIAGNOSIS — F17210 Nicotine dependence, cigarettes, uncomplicated: Secondary | ICD-10-CM | POA: Insufficient documentation

## 2020-10-18 DIAGNOSIS — Z882 Allergy status to sulfonamides status: Secondary | ICD-10-CM | POA: Diagnosis not present

## 2020-10-18 MED ORDER — CETIRIZINE HCL 10 MG PO TABS: 10.0000 mg | ORAL_TABLET | Freq: Every day | ORAL | 0 refills | Status: DC

## 2020-10-18 MED ORDER — BENZONATATE 100 MG PO CAPS: 100.0000 mg | ORAL_CAPSULE | Freq: Three times a day (TID) | ORAL | 0 refills | Status: DC | PRN

## 2020-10-18 MED ORDER — PROMETHAZINE-DM 6.25-15 MG/5ML PO SYRP: 5.0000 mL | ORAL_SOLUTION | Freq: Every evening | ORAL | 0 refills | Status: DC | PRN

## 2020-10-18 NOTE — ED Triage Notes (Signed)
Pt presents with ongoing non productive, shortness of breath, cough and congestion for over a week that is unrelieved with prescribed medication.   Pt presents with left arm & shoulder pain with certain movements that is non injury related.

## 2020-10-18 NOTE — Telephone Encounter (Signed)
Pt is requesting a call back .. he stated that he wen to the urgent care for chest pain and cough .. pt stated he was given medicine and he is using the medicine but his left arm is still hurting him and he is needing some advice on what he should do .. he was given the DX of COPD and URI with cough

## 2020-10-18 NOTE — ED Provider Notes (Signed)
[place]   MRN: [id number] DOB: 1971/01/10  Subjective:   Jeremiah [name] is a 50 y.o. male presenting for 5-day history of acute onset persistent and worsening productive cough with chest congestion, sinus congestion now having shortness of breath in the past 2 days.  Patient has a history of emphysema, is still smoking.  He also has a history of HIV disease and is taking his Biktarvy daily.  He was last seen here on 10/16/2020, had a prednisone course prescribed to him.  He started taking this and reports the new onset of shortness of breath since starting the prednisone.  He is using his albuterol inhaler but is not taking the Symbicort.  Feels like he is actually been having chest pain over the past week.  Denies history of heart disease, MI.  Would like to be checked for COVID-19.  No current facility-administered medications for this encounter.  Current Outpatient Medications:    albuterol (VENTOLIN HFA) 108 (90 Base) MCG/ACT inhaler, Inhale 1-2 puffs into the lungs every 6 (six) hours as needed for wheezing or shortness of breath., Disp: 6.7 g, Rfl: 12   bictegravir-emtricitabine-tenofovir AF (BIKTARVY) 50-200-25 MG TABS tablet, Take 1 tablet by mouth daily., Disp: 30 tablet, Rfl: 11   budesonide-formoterol (SYMBICORT) 160-4.5 MCG/ACT inhaler, Inhale 2 puffs into the lungs in the morning and at bedtime., Disp: 10.2 g, Rfl: 5   Clotrimazole 1 % OINT, Apply thin layer on affected areas of toes/feet twice daily for 1 week or until rash is resolved, Disp: 30 g, Rfl: 0   guaiFENesin (MUCINEX) 600 MG 12 hr tablet, Take 1 tablet (600 mg total) by mouth 2 (two) times daily as needed., Disp: 30 tablet, Rfl: 0   predniSONE (DELTASONE) 20 MG tablet, Take 2 tablets (40 mg total) by mouth daily with breakfast., Disp: 10 tablet, Rfl: 0   varenicline (CHANTIX STARTING MONTH PAK) 0.5 MG X 11 & 1 MG X 42 tablet, Take 1 0.5 mg tablet once daily for 3 days, increase to 1 0.5 mg tablet  twice daily for 4 days, increase to 1 1 mg tablet twice daily., Disp: 53 tablet, Rfl: 0   Allergies  Allergen Reactions   Nsaids Other (See Comments)    DRUG INTERACTION WITH HIV MEDS   Sulfa Antibiotics Itching    Past Medical History:  Diagnosis Date   Anal cancer ([place])    Anal intraepithelial neoplasia I (AIN I)    Anal warts    Appendicitis    Carpal tunnel syndrome    Right   COPD (chronic obstructive pulmonary disease) (HCC)    Depression    Emphysema lung ([place])    Hemorrhoids 02/25/2015   Hepatic steatosis 2021   History of tuberculin skin testing within last year 2014   HIV (human immunodeficiency virus infection) ([place])    Hypertension    Internal hemorrhoids    Latent syphilis    Lung nodule    Right upper lobe   Pneumonia 2007   x2   Pneumothorax 07/01/2018   LEFT   Rectal cancer ([place])    Tinea pedis      Past Surgical History:  Procedure Laterality Date   CHEST TUBE INSERTION ([employer] HX) Left 06/30/2018   CONDYLOMA EXCISION/FULGURATION     HERNIA REPAIR  1991   Abdominal    IR RADIOLOGIST EVAL & MGMT  05/17/2019   IR RADIOLOGY PERIPHERAL GUIDED IV START  05/20/2019   IR TRANSCATH RETRIEVAL FB INCL GUIDANCE (MS)  05/20/2019   IR US GUIDE VASC ACCESS RIGHT  05/20/2019   LAPAROSCOPIC APPENDECTOMY N/A 01/11/2020   Procedure: APPENDECTOMY LAPAROSCOPIC;  Surgeon: Coralie Keens, MD;  Location: [place];  Service: General;  Laterality: N/A;   PORT-A-CATH REMOVAL N/A 05/11/2019   Procedure: PARTIAL PORT REMOVAL;  Surgeon: Leighton Ruff, MD;  Location: [place];  Service: General;  Laterality: N/A;   unilateral orchiectomy     VIDEO ASSISTED THORACOSCOPY (VATS)/WEDGE RESECTION Right 06/23/2018   Procedure: VIDEO ASSISTED THORACOSCOPY (VATS)/LUNG RESECTION, stapling and dissection of apical bleb, wedge resection of right upper lobe mass, lymph node dissection, intercostal nerve block;  Surgeon: Grace Isaac, MD;  Location: [place];  Service: Thoracic;   Laterality: Right;   VIDEO BRONCHOSCOPY N/A 06/23/2018   Procedure: VIDEO BRONCHOSCOPY;  Surgeon: Grace Isaac, MD;  Location: [place];  Service: Thoracic;  Laterality: N/A;    Family History  Problem Relation Age of Onset   Diabetes Mother    Hypertension Mother    Cancer Mother        breast   Diabetes Father    Hypertension Father     Social History   Tobacco Use   Smoking status: Every Day    Packs/day: 0.10    Years: 27.00    Pack years: 2.70    Types: Cigars, Cigarettes   Smokeless tobacco: Never   Tobacco comments:    4 cigars a day-08/10/20  Vaping Use   Vaping Use: Never used  Substance Use Topics   Alcohol use: Yes    Alcohol/week: 0.0 standard drinks    Comment: 1.5 pt liquior day   Drug use: Not Currently    Comment: Past history of crack cocaine  use     ROS   Objective:   Vitals: BP (!) 138/93 (BP Location: Right Arm)   Pulse (!) 59   Temp 98.5 F (36.9 C) (Oral)   Resp 17   SpO2 97%   Physical Exam Constitutional:      General: He is not in acute distress.    Appearance: Normal appearance. He is well-developed. He is not ill-appearing, toxic-appearing or diaphoretic.  HENT:     Head: Normocephalic and atraumatic.     Right Ear: External ear normal.     Left Ear: External ear normal.     Nose: Nose normal.     Mouth/Throat:     Mouth: Mucous membranes are moist.     Pharynx: Oropharynx is clear.  Eyes:     General: No scleral icterus.    Extraocular Movements: Extraocular movements intact.     Pupils: Pupils are equal, round, and reactive to light.  Cardiovascular:     Rate and Rhythm: Normal rate and regular rhythm.     Heart sounds: Normal heart sounds. No murmur heard.   No friction rub. No gallop.  Pulmonary:     Effort: Pulmonary effort is normal. No respiratory distress.     Breath sounds: No stridor. Rhonchi present. No wheezing or rales.  Neurological:     Mental Status: He is alert and oriented to person, place, and  time.  Psychiatric:        Mood and Affect: Mood normal.        Behavior: Behavior normal.        Thought Content: Thought content normal.    DG Chest 2 View  Result Date: 10/18/2020 CLINICAL DATA:  Cough and shortness of breath EXAM: CHEST - 2 VIEW COMPARISON:  Chest radiograph October 01, 2019; chest CT October 10, 2019 FINDINGS: There is postoperative change with scarring in the right apex region. There is chronic blunting of the right costophrenic angle. No edema or airspace opacity. Heart size and pulmonary vascularity are normal. No adenopathy. No bone lesions. IMPRESSION: Scarring with postoperative change right apex region. Chronic scarring with pleural thickening right base. No edema or airspace opacity. Heart size normal. Electronically Signed   By: Lowella Grip III M.D.   On: 10/18/2020 13:39     Assessment and Plan :   PDMP not reviewed this encounter.  1. Shortness of breath   2. Cough   3. Other emphysema ([place])   4. Atypical chest pain     COVID-19 testing pending.  Offered patient IM Solu-Medrol in clinic but he declined.  Recommended finishing out his current course of prednisone.  Offered supportive care.  Maintain strict ER precautions. Counseled patient on potential for adverse effects with medications prescribed today, patient verbalized understanding.    Jaynee Eagles, PA-C 10/18/20 1353

## 2020-10-18 NOTE — Telephone Encounter (Signed)
RTC, patient states he went to Urgent care on Tuesday for coughing and arm pain.  He states they did an EKG which was normal and told him "he had a respiratory cold".  He states he is still coughing, but it is much better and his mucus is not green any longer.  He is still c/o left arm pain and states the pain is only with movement, especially when he lifts his arm high above his head.  He is requesting an appt for evaluation.  Appt offered today, he declines and states he has to go to work today.  Appt given on 10/23/20, red team, Dr. Collene Gobble at [phone]. SChaplin, RN,BSN

## 2020-10-18 NOTE — Telephone Encounter (Signed)
Pt is requesting a call back he stated that if he can come in today instead of Tuesday

## 2020-10-18 NOTE — Telephone Encounter (Signed)
RTC, patient informed that there are no longer any appointments available for today.  He was informed if his arm pain became worse or he had any new symptoms such as jaw pain, SOB, chest pain to go to the ED and he verbalized understanding.  Pt heard coughing over the phone and RN asked if he took a Covid test, he states no.  RN advised patient he should take a covid test, he states he has now developed some GI upset and asking if this could be a symptom of Covid.  He was informed it could be, patient states he will go to the urgent care and ask for a covid test.  RN instructed patient if it is positive to call back and cancel his appt for Tuesday, informed he will need to reschedule and he verbalized understanding. SChaplin, RN,BSN

## 2020-10-19 LAB — SARS CORONAVIRUS 2 (TAT 6-24 HRS): SARS Coronavirus 2: NEGATIVE

## 2020-10-23 ENCOUNTER — Encounter: Payer: Medicaid Other | Admitting: Student

## 2020-10-23 ENCOUNTER — Encounter: Payer: Self-pay | Admitting: *Deleted

## 2020-10-24 ENCOUNTER — Encounter: Payer: Self-pay | Admitting: Student

## 2020-10-24 ENCOUNTER — Ambulatory Visit (INDEPENDENT_AMBULATORY_CARE_PROVIDER_SITE_OTHER): Payer: Medicaid Other | Admitting: Student

## 2020-10-24 ENCOUNTER — Other Ambulatory Visit: Payer: Self-pay

## 2020-10-24 VITALS — BP 120/90 | HR 75 | Temp 98.1°F | Ht 71.0 in | Wt 155.2 lb

## 2020-10-24 DIAGNOSIS — Z23 Encounter for immunization: Secondary | ICD-10-CM

## 2020-10-24 DIAGNOSIS — B351 Tinea unguium: Secondary | ICD-10-CM

## 2020-10-24 DIAGNOSIS — W57XXXA Bitten or stung by nonvenomous insect and other nonvenomous arthropods, initial encounter: Secondary | ICD-10-CM

## 2020-10-24 DIAGNOSIS — S30860A Insect bite (nonvenomous) of lower back and pelvis, initial encounter: Secondary | ICD-10-CM | POA: Diagnosis not present

## 2020-10-24 DIAGNOSIS — Z0000 Encounter for general adult medical examination without abnormal findings: Secondary | ICD-10-CM

## 2020-10-24 DIAGNOSIS — C210 Malignant neoplasm of anus, unspecified: Secondary | ICD-10-CM | POA: Diagnosis not present

## 2020-10-24 DIAGNOSIS — G4700 Insomnia, unspecified: Secondary | ICD-10-CM | POA: Diagnosis not present

## 2020-10-24 DIAGNOSIS — J309 Allergic rhinitis, unspecified: Secondary | ICD-10-CM | POA: Diagnosis not present

## 2020-10-24 DIAGNOSIS — K644 Residual hemorrhoidal skin tags: Secondary | ICD-10-CM

## 2020-10-24 DIAGNOSIS — H538 Other visual disturbances: Secondary | ICD-10-CM

## 2020-10-24 MED ORDER — CETIRIZINE HCL 10 MG PO TABS: 10.0000 mg | ORAL_TABLET | Freq: Every day | ORAL | 0 refills | Status: DC

## 2020-10-24 MED ORDER — DOXYCYCLINE MONOHYDRATE 100 MG PO TABS: 100.0000 mg | ORAL_TABLET | Freq: Two times a day (BID) | ORAL | 0 refills | Status: DC

## 2020-10-24 MED ORDER — HYDROCORTISONE ACETATE 25 MG RE SUPP: 25.0000 mg | Freq: Two times a day (BID) | RECTAL | 0 refills | Status: AC

## 2020-10-24 MED ORDER — TRAZODONE HCL 50 MG PO TABS: 50.0000 mg | ORAL_TABLET | Freq: Every day | ORAL | 0 refills | Status: DC

## 2020-10-24 NOTE — Assessment & Plan Note (Signed)
Mr. [name] reports that he has noticed what he believes are hemorrhoids within the last few days. He mentions that these are not painful and he sometimes has blood when wiping. Denies any recent sexual activity. He says that he has had some loose stools recently but denies constipation. He does have a previous history of hemorrhoids as well as anal cancer.   On exam today, appears Jeremiah [name] has external hemorrhoids. No internal masses appreciated, rectal vault empty. I have prescribed him suppository to help with this. I have instructed him to return to clinic if these do not improve within the next two weeks.  - Anusol 25mg  twice daily for two weeks - Return to clinic if symptoms worsen or do not improve

## 2020-10-24 NOTE — Assessment & Plan Note (Signed)
Jeremiah [name] today also reports that he has experienced chronic cough with yellow sputum in addition to post-nasal drip for roughly the last month. Mentions that this has been persistent and denies known allergies. He mentions he had a COVID-19 test last week that was negative. Denies fevers, chills, epiphora, dyspnea, abdominal pain, nausea, vomiting.  Per chart review, patient was previously on cetirizine. However, Jeremiah [name] does not believe he has been taking this. His symptoms sound most consistent with allergic rhinitis, although cannot rule out other causes of chronic cough, such as GERD. Will re-initiate cetirizine today.  - Cetirizine 10mg  daily - Return to clinic if symptoms persist or worsen

## 2020-10-24 NOTE — Assessment & Plan Note (Addendum)
Jeremiah [name] reports that he was bitten by a tick over a month ago, but continues to have a localized reaction in that area. Mentions that he did see the physical tick and pulled it off of him, although he is unsure if he got all of the tick out. He notes that the area is intermittently pruritic and denies any drainage from the site. He denies any rash, fevers, chills, joint aches, neuropathy, chest pain, dyspnea.   Per chart review, he was seen by our clinic four days after this occurred. At that time patient was offered reassurance given the timeline of the bite. On exam today, there is a small 1x2cm mildly indurated localized reaction with overlying ulceration in the left inguinal area. No drainage, purulence, or erythema noticed. Ultrasound was obtained to further evaluate. On ultrasound there is a pocket of unknown material, not just fluid, walled off from the rest off the tissue. It is possible that some of the tick was not pulled out, creating a small pocket there. We offered incision and drainage, but Jeremiah [name] opted to treat with antibiotics first. We have also instructed him to keep warm compresses on the area and return to clinic if this does not improve. Can consider bedside I&D if symptoms do not improve.  - Doxycycline 100mg  twice daily for 10 days - Daily warm compress - If symptoms do not improve, can consider bedside I&D

## 2020-10-24 NOTE — Patient Instructions (Signed)
[name], it was a pleasure seeing you today!  Today we discussed: - Tick bite: Take the antibiotic (doxycycline) for 10 days along with using warm compress. If the symptoms do not improve, return to clinic.  - Hemorrhoids: I have prescribed a suppository that you should take for 14 days  - Insomnia: I have prescribed trazodone to help with sleep. Take one prior to bedtime.  - Allergies: Take zyrtec (cetirizine) once daily.  I have ordered the following labs today:  Lab Orders  No laboratory test(s) ordered today     Referrals ordered today:   Referral Orders  Ambulatory referral to Podiatry     I have ordered the following medication/changed the following medications:   Stop the following medications: Medications Discontinued During This Encounter  Medication Reason   cetirizine (ZYRTEC ALLERGY) 10 MG tablet      Start the following medications: Meds ordered this encounter  Medications   doxycycline (ADOXA) 100 MG tablet    Sig: Take 1 tablet (100 mg total) by mouth 2 (two) times daily for 10 days.    Dispense:  20 tablet    Refill:  0   hydrocortisone (ANUSOL-HC) 25 MG suppository    Sig: Place 1 suppository (25 mg total) rectally every 12 (twelve) hours for 14 days.    Dispense:  28 suppository    Refill:  0   cetirizine (ZYRTEC ALLERGY) 10 MG tablet    Sig: Take 1 tablet (10 mg total) by mouth daily.    Dispense:  90 tablet    Refill:  0   traZODone (DESYREL) 50 MG tablet    Sig: Take 1 tablet (50 mg total) by mouth at bedtime.    Dispense:  30 tablet    Refill:  0     Follow-up:  if symptoms do not improve    Please make sure to arrive 15 minutes prior to your next appointment. If you arrive late, you may be asked to reschedule.   We look forward to seeing you next time. Please call our clinic at [phone] if you have any questions or concerns. The best time to call is Monday-Friday from 9am-4pm, but there is someone available 24/7. If after  hours or the weekend, call the main hospital number and ask for the Internal Medicine Resident On-Call. If you need medication refills, please notify your pharmacy one week in advance and they will send [place] a request.  Thank you for letting us take part in your care. Wishing you the best!  Thank you, Sanjuan Dame, MD

## 2020-10-24 NOTE — Assessment & Plan Note (Addendum)
Jeremiah [name] states he has been having trouble going to sleep for the last few months. He says that he avoids caffeine in the afternoon but does mention occasional alcohol use in the evenings. Denies watching television prior to bedtime. Also notes that he usually stays out of the bedroom prior to bedtime. Previously had tried melatonin with minimal relief.  I counseled Jeremiah [name] on proper sleep hygeine, including decreasing nighttime alcohol intake. We will try trazodone to help aid with his sleep.  - Trazodone 50mg  QHS

## 2020-10-24 NOTE — Assessment & Plan Note (Signed)
-   PVC20 given today - Colonoscopy referral sent - Ophthalmology referral sent - Podiatry referral sent

## 2020-10-24 NOTE — Progress Notes (Signed)
   CC: tick bite, cough, hemorrhoids  HPI:  Mr.Nayib Ang is a 50 y.o. with medical history as below presenting to [facility] for recent tick bite, chronic cough, hemorrhoids.  Please see problem-based list for further details, assessments, and plans.  Past Medical History:  Diagnosis Date   Anal cancer ([place])    Anal intraepithelial neoplasia I (AIN I)    Anal warts    Appendicitis    Carpal tunnel syndrome    Right   COPD (chronic obstructive pulmonary disease) ([place])    Depression    Emphysema lung ([place])    Hemorrhoids 02/25/2015   Hepatic steatosis 2021   History of tuberculin skin testing within last year 2014   HIV (human immunodeficiency virus infection) ([place])    Hypertension    Internal hemorrhoids    Latent syphilis    Lung nodule    Right upper lobe   Pneumonia 2007   x2   Pneumothorax 07/01/2018   LEFT   Rectal cancer ([place])    Tinea pedis    Review of Systems:  As per HPI  Physical Exam:  Vitals:   10/24/20 0830  BP: 120/90  Pulse: 75  Temp: 98.1 F (36.7 C)  TempSrc: Oral  SpO2: 99%  Weight: 155 lb 3.2 oz (70.4 kg)  Height: 5\' 11"  (1.803 m)   General: Sitting comfortably in chair in no acute distress CV: Regular rate, rhythm. No murmurs, rubs, gallops. Warm extremities.  Pulm: Normal work of breathing on room air. Clear to auscultation bilaterally. MSK: Normal bulk, tone. No pitting edema bilaterally. Skin: Small 1x2cm mildly indurated, localized reaction with overlying ulceration in left inguinal fold. No drainage, purulence, or erythema noticed.  Neuro: Awake, alert, answering questions appropriately. Psych: Normal mood, affect, speech.  Assessment & Plan:   See Encounters Tab for problem based charting.  Patient discussed with Dr. Heber [place]

## 2020-10-26 NOTE — Progress Notes (Signed)
Internal Medicine Clinic Attending  I saw and evaluated the patient.  I personally confirmed the key portions of the history and exam documented by Dr. Braswell and I reviewed pertinent patient test results.  The assessment, diagnosis, and plan were formulated together and I agree with the documentation in the resident's note.  

## 2020-11-01 ENCOUNTER — Other Ambulatory Visit: Payer: Self-pay | Admitting: Student

## 2020-11-01 ENCOUNTER — Ambulatory Visit: Payer: Medicaid Other | Admitting: Podiatry

## 2020-11-01 DIAGNOSIS — H11133 Conjunctival pigmentations, bilateral: Secondary | ICD-10-CM | POA: Diagnosis not present

## 2020-11-01 DIAGNOSIS — H11153 Pinguecula, bilateral: Secondary | ICD-10-CM | POA: Diagnosis not present

## 2020-11-01 DIAGNOSIS — W57XXXA Bitten or stung by nonvenomous insect and other nonvenomous arthropods, initial encounter: Secondary | ICD-10-CM

## 2020-11-01 DIAGNOSIS — S30860A Insect bite (nonvenomous) of lower back and pelvis, initial encounter: Secondary | ICD-10-CM

## 2020-11-01 DIAGNOSIS — H2513 Age-related nuclear cataract, bilateral: Secondary | ICD-10-CM | POA: Diagnosis not present

## 2020-11-01 MED ORDER — DOXYCYCLINE HYCLATE 50 MG PO TABS: 2.0000 | ORAL_TABLET | Freq: Two times a day (BID) | ORAL | 0 refills | Status: AC

## 2020-11-06 ENCOUNTER — Other Ambulatory Visit: Payer: Self-pay

## 2020-11-06 ENCOUNTER — Ambulatory Visit (INDEPENDENT_AMBULATORY_CARE_PROVIDER_SITE_OTHER): Payer: Medicaid Other | Admitting: Podiatry

## 2020-11-06 ENCOUNTER — Encounter: Payer: Self-pay | Admitting: Podiatry

## 2020-11-06 DIAGNOSIS — B351 Tinea unguium: Secondary | ICD-10-CM

## 2020-11-06 MED ORDER — TERBINAFINE HCL 250 MG PO TABS: 250.0000 mg | ORAL_TABLET | Freq: Every day | ORAL | 0 refills | Status: DC

## 2020-11-09 NOTE — Progress Notes (Signed)
  Subjective:  Patient ID: Jeremiah [name], male    DOB: 07-16-1970,  MRN: [id number]  Chief Complaint  Patient presents with   Nail Problem      (np) bil toenail fungus    50 y.o. male presents with the above complaint. History confirmed with patient.  Both toenails are thickened discolored and thinks he runs in his family  Objective:  Physical Exam: warm, good capillary refill, no trophic changes or ulcerative lesions, normal DP and PT pulses, and normal sensory exam. Left Foot: dystrophic yellowed discolored nail plates with subungual debris Right Foot: dystrophic yellowed discolored nail plates with subungual debris   Assessment:   1. Onychomycosis      Plan:  Patient was evaluated and treated and all questions answered.  Discussed treatment and etiology of onychomycosis in detail with the patient.  Discussed oral and topical treatment.  I recommend oral treatment with terbinafine.  He is currently taking doxycycline for a tick bite and will begin the terbinafine after he finishes this.  Order for LFTs was given to him to be checked before his next visit  Return in about 4 months (around 03/09/2021) for follow up after nail fungus treatment.

## 2020-11-22 ENCOUNTER — Encounter: Payer: Self-pay | Admitting: Gastroenterology

## 2020-11-22 ENCOUNTER — Telehealth: Payer: Self-pay | Admitting: *Deleted

## 2020-11-22 NOTE — Telephone Encounter (Signed)
Phone note opened in error - no call made or received at this time. Yvonna Alanis, RN 11/22/20, 3:30P

## 2020-12-01 ENCOUNTER — Other Ambulatory Visit: Payer: Self-pay | Admitting: Student

## 2020-12-01 DIAGNOSIS — G4700 Insomnia, unspecified: Secondary | ICD-10-CM

## 2020-12-19 ENCOUNTER — Telehealth: Payer: Self-pay

## 2020-12-19 NOTE — Telephone Encounter (Signed)
Erin from [facility]  called requested a hospital follow up for pt , he is being discharged today 12/19/20. Was given 12/28/20@ 9:30  with Dr Humphrey Rolls .   Contact info : Erin ........[phone] Ext : [phone]

## 2020-12-21 NOTE — Telephone Encounter (Signed)
Transition Care Management Unsuccessful Follow-up Telephone Call  Date of discharge and from where:  12/19/2020 Attempts:  1st Attempt  Reason for unsuccessful TCM follow-up call:  Left voice message Reminded patient of upcoming appointment.  Attempts to call Jeremiah [name] about getting records from visit for preparation /follow up visit there . Unable to reach at number given.  Sander Nephew, RN 12/20/2020 and 12/21/2020 9:04 AM.

## 2020-12-21 NOTE — Telephone Encounter (Signed)
Transition Care Management Follow-up Telephone Call Date of discharge and from where: [facility] How have you been since you were released from the hospital? 12/19/2020 Any questions or concerns? Yes  Items Reviewed: Did the pt receive and understand the discharge instructions provided? No  Medications obtained and verified? No  Other? No  Any new allergies since your discharge? No  Dietary orders reviewed? No Do you have support at home? No   Home Care and Equipment/Supplies: Were home health services ordered? no If so, what is the name of the agency? N/A  Has the agency set up a time to come to the patient's home? not applicable Were any new equipment or medical supplies ordered?  No What is the name of the medical supply agency? N/A Were you able to get the supplies/equipment? not applicable Do you have any questions related to the use of the equipment or supplies? No  Functional Questionnaire: (I = Independent and D = Dependent) ADLs: Independent  Bathing/Dressing- Independent   Meal Prep- Independent  Eating- Well  Maintaining continence- no poblem  Transferring/Ambulation- no problem  Managing Meds- no problem  Follow up appointments reviewed:  PCP Hospital f/u appt confirmed? Yes  Scheduled to see Dr. Welton Flakes  on 12/28/2020 @ 9:30 AM. Specialist Hospital f/u appt confirmed? Yes  Are transportation arrangements needed? No  If their condition worsens, is the pt aware to call PCP or go to the Emergency Dept.? Yes Was the patient provided with contact information for the PCP's office or ED? Yes Was to pt encouraged to call back with questions or concerns? Yes    No records were received from patient's [place] visit.  Patient stated has now moved to [facility] and will be seeking care there.  Would like to get referral to Dentist if possible.  Patient was advised to fund the name of a Dentist he would like to see .  Plans to call insurance company to get  a list of names.  No concerns at present will come for appointment on 12/28/2020 with name of the Physician he is transferring to.  Angelina Ok, RN 12/21/2020 11:30 AM.

## 2020-12-25 ENCOUNTER — Telehealth: Payer: Self-pay

## 2020-12-25 NOTE — Telephone Encounter (Signed)
Please call pt back about meds.  

## 2020-12-25 NOTE — Telephone Encounter (Signed)
Returned call to patient. States he will need to call us back. Instructed to call and ask for Triage.

## 2020-12-28 ENCOUNTER — Encounter: Payer: Medicaid Other | Admitting: Internal Medicine

## 2020-12-31 ENCOUNTER — Other Ambulatory Visit: Payer: Self-pay

## 2020-12-31 ENCOUNTER — Ambulatory Visit (INDEPENDENT_AMBULATORY_CARE_PROVIDER_SITE_OTHER): Payer: Medicaid Other | Admitting: Internal Medicine

## 2020-12-31 ENCOUNTER — Encounter: Payer: Self-pay | Admitting: Internal Medicine

## 2020-12-31 DIAGNOSIS — B20 Human immunodeficiency virus [HIV] disease: Secondary | ICD-10-CM | POA: Diagnosis not present

## 2020-12-31 DIAGNOSIS — K029 Dental caries, unspecified: Secondary | ICD-10-CM

## 2020-12-31 DIAGNOSIS — M25512 Pain in left shoulder: Secondary | ICD-10-CM | POA: Diagnosis not present

## 2020-12-31 DIAGNOSIS — R202 Paresthesia of skin: Secondary | ICD-10-CM | POA: Diagnosis not present

## 2020-12-31 DIAGNOSIS — Z0000 Encounter for general adult medical examination without abnormal findings: Secondary | ICD-10-CM | POA: Diagnosis present

## 2020-12-31 DIAGNOSIS — R200 Anesthesia of skin: Secondary | ICD-10-CM | POA: Insufficient documentation

## 2020-12-31 DIAGNOSIS — C210 Malignant neoplasm of anus, unspecified: Secondary | ICD-10-CM

## 2020-12-31 NOTE — Assessment & Plan Note (Signed)
Assessment:  Patient states he has numbness in right hand that is mostly present during the night time. It does not recur during the day. 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 This has been a chronic issue for him but has not sought treatment for it. Occupation and symptom appear to be consistent with carpel tunnel syndrome.  Plan: -Wrist brace at night time  -Continue to monitor

## 2020-12-31 NOTE — Assessment & Plan Note (Signed)
Flu shot given today

## 2020-12-31 NOTE — Patient Instructions (Addendum)
Jeremiah [name], it was a pleasure seeing you today!  Today we discussed: You endorsed feeling well today. You endorsed left shoulder pain for which I advise you to rest from heavy lifting and focus on shoulder exercises along with ice. You can also use IcyHot with Lidocaine for relief. You can also try Voltaren gel as well which should help with your pain. You also endorsed right hand numbness. I would advise you to wear a wrist brace at night time for relief as you get numbness overnight. Please get repeat cell counts as lab order is pending by your infectious disease doctor. Also follow up with Dr. Candis Schatz on 01/03/21 at 1045am. We will give you a flu shot today. If your symptoms worsen, please come back and see [place].  I have ordered the following labs today:  Lab Orders  No laboratory test(s) ordered today      Referrals ordered today:   Referral Orders  No referral(s) requested today     I have ordered the following medication/changed the following medications:   Stop the following medications: There are no discontinued medications.   Start the following medications: No orders of the defined types were placed in this encounter.    Follow-up: 3 months   Please make sure to arrive 15 minutes prior to your next appointment. If you arrive late, you may be asked to reschedule.   We look forward to seeing you next time. Please call our clinic at [phone] if you have any questions or concerns. The best time to call is Monday-Friday from 9am-4pm, but there is someone available 24/7. If after hours or the weekend, call the main hospital number and ask for the Internal Medicine Resident On-Call. If you need medication refills, please notify your pharmacy one week in advance and they will send [place] a request.  Thank you for letting us take part in your care. Wishing you the best!  Thank you, Idamae Schuller, MD

## 2020-12-31 NOTE — Assessment & Plan Note (Signed)
Assessment: Patient is normotensive. His blood pressure is 113/80. His antihypertensive was discontinued and he feels his blood pressure is doing well with out it.  Plan: -Continue to monitor

## 2020-12-31 NOTE — Assessment & Plan Note (Signed)
Patient stated he had a dental appointment later today.

## 2020-12-31 NOTE — Assessment & Plan Note (Signed)
Assessment: Patient has a history of anal cancer and is in remission. He has follow up with GI on 01/03/21 that he did not know about.   Plan: -Advised patient to go to this appointment

## 2020-12-31 NOTE — Progress Notes (Signed)
CC: follow up  HPI:  Mr.Roston Hartshorne is a 50 y.o. with medical history as below presenting to [facility] for follow up.  Please see problem-based list for further details, assessments, and plans.  Past Medical History:  Diagnosis Date   Anal cancer ([place])    Anal intraepithelial neoplasia I (AIN I)    Anal warts    Appendicitis    Carpal tunnel syndrome    Right   COPD (chronic obstructive pulmonary disease) ([place])    Depression    Dyslipidemia 05/19/2013   Emphysema lung ([place])    Hemorrhoids 02/25/2015   Hepatic steatosis 2021   History of chlamydia 05/06/2013   Treated 2014   History of tuberculin skin testing within last year 2014   HIV (human immunodeficiency virus infection) ([place])    Hypertension    Internal hemorrhoids    Latent syphilis    Lung nodule    Right upper lobe   Pneumonia 2007   x2   Pneumothorax 07/01/2018   LEFT   Rectal cancer ([place])    Right sided abdominal pain 01/09/2020   S/P laparoscopic appendectomy 01/10/2020   Tinea pedis    Review of Systems: Review of system negative unless stated in the problem list or HPI.      Physical Exam:  Vitals:   12/31/20 0905  BP: 113/80  Pulse: 64  Resp: (!) 28  Temp: 97.9 F (36.6 C)  TempSrc: Oral  SpO2: 100%  Weight: 170 lb 1.6 oz (77.2 kg)  Height: '5\' 11"'$  (1.803 m)    Physical Exam Constitutional:      General: He is not in acute distress.    Appearance: Normal appearance. He is not ill-appearing.  HENT:     Head: Normocephalic and atraumatic.     Right Ear: External ear normal.     Left Ear: External ear normal.     Nose: Nose normal.     Mouth/Throat:     Mouth: Mucous membranes are moist.     Pharynx: Oropharynx is clear. No oropharyngeal exudate or posterior oropharyngeal erythema.  Eyes:     Extraocular Movements: Extraocular movements intact.     Conjunctiva/sclera: Conjunctivae normal.     Pupils: Pupils are equal, round, and reactive to light.  Cardiovascular:     Rate and  Rhythm: Normal rate and regular rhythm.     Pulses: Normal pulses.     Heart sounds: Normal heart sounds. No murmur heard.   No friction rub.  Pulmonary:     Effort: Pulmonary effort is normal.     Breath sounds: Normal breath sounds.  Abdominal:     General: Bowel sounds are normal.     Palpations: Abdomen is soft.  Musculoskeletal:        General: Tenderness (left shoulder tenderness with movement) present. No swelling, deformity or signs of injury. Normal range of motion.     Cervical back: Normal range of motion and neck supple.     Right lower leg: No edema.     Left lower leg: No edema.  Skin:    Capillary Refill: Capillary refill takes less than 2 seconds.     Coloration: Skin is not jaundiced or pale.     Findings: No erythema, lesion or rash.  Neurological:     General: No focal deficit present.     Mental Status: He is alert and oriented to person, place, and time. Mental status is at baseline.  Psychiatric:  Mood and Affect: Mood normal.        Behavior: Behavior normal.    Assessment & Plan:   See Encounters Tab for problem based charting.  Patient seen with Dr. Odella Aquas, MD

## 2020-12-31 NOTE — Assessment & Plan Note (Signed)
Assessment: Patient has left shoulder pain that has been present for 2 weeks. He states it started after he lifted something heavy at inpatient alcohol facility. The pain occurs with movement, and described as a sharp pain. He only took tylenol for this pain. He lifted heavy weights at the gym four days ago. Advised him delay heavy lifting until pain resolves. Advised him light activities and shoulder exercises. Asked him to apply ice or use IcyHot with lidocaine on the shoulder. Also advised him to try Voltaren gel on the shoulder.   Plan: -Continue tylenol -Start IcyHot with lidocaine -Start Voltaren gel -Shoulder exercises -Return to clinic if pain worsens.

## 2020-12-31 NOTE — Assessment & Plan Note (Signed)
Assessment: Patient has HIV and follows Infectious Disease twice a month. He is on Biktarvy 50-200-25 mg daily. He saw the ID last in April 2022. His last cell count is from a year ago showing CD4 count 274 and viral load to be <20. Patient had repeat lab work pending from April 2022 visit with ID.   Plan: -Continue taking Biktarvy 50-200-25 mg -Follow with ID regularly

## 2021-01-02 NOTE — Progress Notes (Signed)
Internal Medicine Clinic Attending  I saw and evaluated the patient.  I personally confirmed the key portions of the history and exam documented by Dr. Khan and I reviewed pertinent patient test results.  The assessment, diagnosis, and plan were formulated together and I agree with the documentation in the resident's note.  

## 2021-01-03 ENCOUNTER — Ambulatory Visit: Payer: Medicaid Other | Admitting: Gastroenterology

## 2021-01-23 ENCOUNTER — Other Ambulatory Visit: Payer: Medicaid Other

## 2021-01-23 ENCOUNTER — Other Ambulatory Visit: Payer: Self-pay

## 2021-01-23 DIAGNOSIS — B20 Human immunodeficiency virus [HIV] disease: Secondary | ICD-10-CM

## 2021-01-24 LAB — T-HELPER CELL (CD4) - (RCID CLINIC ONLY)
CD4 % Helper T Cell: 20 % — ABNORMAL LOW (ref 33–65)
CD4 T Cell Abs: 242 /uL — ABNORMAL LOW (ref 400–1790)

## 2021-01-26 LAB — HIV-1 RNA QUANT-NO REFLEX-BLD
HIV 1 RNA Quant: NOT DETECTED Copies/mL
HIV-1 RNA Quant, Log: NOT DETECTED Log cps/mL

## 2021-01-30 ENCOUNTER — Ambulatory Visit (INDEPENDENT_AMBULATORY_CARE_PROVIDER_SITE_OTHER)
Admission: RE | Admit: 2021-01-30 | Discharge: 2021-01-30 | Disposition: A | Discharge status: Home / Self Care | Payer: Medicaid Other | Source: Ambulatory Visit | Attending: Gastroenterology | Admitting: Gastroenterology

## 2021-01-30 ENCOUNTER — Other Ambulatory Visit: Payer: Self-pay | Admitting: *Deleted

## 2021-01-30 ENCOUNTER — Encounter: Payer: Self-pay | Admitting: Gastroenterology

## 2021-01-30 ENCOUNTER — Telehealth: Payer: Self-pay | Admitting: *Deleted

## 2021-01-30 ENCOUNTER — Ambulatory Visit (INDEPENDENT_AMBULATORY_CARE_PROVIDER_SITE_OTHER): Payer: Medicaid Other | Admitting: Gastroenterology

## 2021-01-30 ENCOUNTER — Other Ambulatory Visit: Payer: Self-pay

## 2021-01-30 ENCOUNTER — Encounter: Payer: Self-pay | Admitting: *Deleted

## 2021-01-30 VITALS — BP 110/80 | HR 67 | Ht 71.0 in | Wt 170.4 lb

## 2021-01-30 DIAGNOSIS — J439 Emphysema, unspecified: Secondary | ICD-10-CM | POA: Diagnosis not present

## 2021-01-30 DIAGNOSIS — J449 Chronic obstructive pulmonary disease, unspecified: Secondary | ICD-10-CM | POA: Diagnosis not present

## 2021-01-30 DIAGNOSIS — R0602 Shortness of breath: Secondary | ICD-10-CM | POA: Diagnosis not present

## 2021-01-30 DIAGNOSIS — Z85048 Personal history of other malignant neoplasm of rectum, rectosigmoid junction, and anus: Secondary | ICD-10-CM

## 2021-01-30 DIAGNOSIS — K625 Hemorrhage of anus and rectum: Secondary | ICD-10-CM | POA: Diagnosis not present

## 2021-01-30 DIAGNOSIS — I10 Essential (primary) hypertension: Secondary | ICD-10-CM | POA: Diagnosis not present

## 2021-01-30 DIAGNOSIS — R059 Cough, unspecified: Secondary | ICD-10-CM | POA: Diagnosis not present

## 2021-01-30 MED ORDER — SUTAB 1479-225-188 MG PO TABS: 1.0000 | ORAL_TABLET | Freq: Once | ORAL | 0 refills | Status: AC

## 2021-01-30 MED ORDER — BUDESONIDE-FORMOTEROL FUMARATE 160-4.5 MCG/ACT IN AERO: 2.0000 | INHALATION_SPRAY | Freq: Two times a day (BID) | RESPIRATORY_TRACT | 5 refills | Status: DC

## 2021-01-30 NOTE — Telephone Encounter (Signed)
Patient called in requesting refill on round inhaler, not albuterol. Per Dr. Matilde Bash [facility]) phone note on 4/26, Symbicort was recommended as PA for Trelegy was denied. He will call Dr. Matilde Bash office for refill. If not, he will call back to schedule appt at [facility].

## 2021-01-30 NOTE — Patient Instructions (Signed)
If you are age 50 or older, your body mass index should be between 23-30. Your Body mass index is 23.76 kg/m. If this is out of the aforementioned range listed, please consider follow up with your Primary Care Provider.  If you are age 2 or younger, your body mass index should be between 19-25. Your Body mass index is 23.76 kg/m. If this is out of the aformentioned range listed, please consider follow up with your Primary Care Provider.   Your provider has requested that you have an chest x ray before leaving today. Please go to the basement floor to our Radiology department for the test.   We have sent the following medications to your pharmacy for you to pick up at your convenience:Symbicort 160-4.5 2 puffs twice a day.  You have been scheduled for a colonoscopy. Please follow written instructions given to you at your visit today.  Please pick up your prep supplies at the pharmacy within the next 1-3 days. If you use inhalers (even only as needed), please bring them with you on the day of your procedure.  The [place] [facility] providers would like to encourage you to use [facility] to communicate with providers for non-urgent requests or questions.  Due to long hold times on the telephone, sending your provider a message by [facility] may be a faster and more efficient way to get a response.  Please allow 48 business hours for a response.  Please remember that this is for non-urgent requests.   Due to recent changes in healthcare laws, you may see the results of your imaging and laboratory studies on MyChart before your provider has had a chance to review them.  We understand that in some cases there may be results that are confusing or concerning to you. Not all laboratory results come back in the same time frame and the provider may be waiting for multiple results in order to interpret others.  Please give [place] 48 hours in order for your provider to thoroughly review all the results before contacting the  office for clarification of your results.   It was a pleasure to see you today!  Thank you for trusting me with your gastrointestinal care!    Scott E. Candis Schatz, MD

## 2021-01-30 NOTE — Progress Notes (Signed)
HPI : Jeremiah [name] is a pleasant 50 year old male with a history of well controlled HIV, anal cancer and COPD who is referred to [place] by Dr. Gilles Chiquito for follow up of anal cancer.  He also has a history of a lung resection for suspicious lesion that ended up being benign (granulomatous tissue).  The patient states he was diagnosed and treated with anal cancer in 2019.  He underwent radiation and chemotherapy from April through August of 2019.  He elected to receive his cancer treatment in [place] because that was where he had the most family support.  He is not sure if he has ever had a colonoscopy.  He does not recall ever having to do a bowel prep.  His last CT scan was in September 2021 he presented for abdominal pain and was found to have acute appendicitis.  There was no evidence of recurrent cancer on the CT. The patient denies any chronic GI symptoms such as abdominal pain, constipation, diarrhea or blood in the stool.  However, he has had a couple of episodes of bright red blood per rectum in the past month and was having some difficulty evacuating stools around the same time.  Both the symptoms have resolved without intervention. The patient currently is bothered by symptoms of chest discomfort, vaguely described as a "numbness", pressure and bloating of the chest.  The symptoms are associated with shortness of breath.  He says he has had these symptoms many times in the past, and they have responded when he took his Symbicort.  He says he ran out of his Symbicort a month or 2 ago.  He has been taking albuterol, but this does not seem to be helping.  He has also had a productive cough for the past month with mucus, but no blood.  He denies any fevers, chills or night sweats.  He denies symptoms of burning pain in the chest or acid regurgitation.  No nausea or vomiting.  No difficulty swallowing.    Past Medical History:  Diagnosis Date   Anal cancer ([place])    Anal  intraepithelial neoplasia I (AIN I)    Anal warts    Appendicitis    Carpal tunnel syndrome    Right   COPD (chronic obstructive pulmonary disease) ([place])    Depression    Dyslipidemia 05/19/2013   Emphysema lung ([place])    Hemorrhoids 02/25/2015   Hepatic steatosis 2021   History of chlamydia 05/06/2013   Treated 2014   History of tuberculin skin testing within last year 2014   HIV (human immunodeficiency virus infection) ([place])    Hypertension    Internal hemorrhoids    Latent syphilis    Lung nodule    Right upper lobe   Pneumonia 2007   x2   Pneumothorax 07/01/2018   LEFT   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[place]) 01/06/2018   06/23/2018-surgical pathology report- lung wedge biopsy necrotizing granulomatous inflammation associated fibrosis, no evidence of malignancy   Rectal cancer ([place])    Right sided abdominal pain 01/09/2020   S/P laparoscopic appendectomy 01/10/2020   Tinea pedis      Past Surgical History:  Procedure Laterality Date   CHEST TUBE INSERTION ([place] HX) Left 06/30/2018   CONDYLOMA EXCISION/FULGURATION     HERNIA REPAIR  1991   Abdominal    IR RADIOLOGIST EVAL & MGMT  05/17/2019   IR RADIOLOGY PERIPHERAL GUIDED IV START  05/20/2019   IR TRANSCATH RETRIEVAL FB INCL GUIDANCE (  MS)  05/20/2019   IR US GUIDE VASC ACCESS RIGHT  05/20/2019   LAPAROSCOPIC APPENDECTOMY N/A 01/11/2020   Procedure: APPENDECTOMY LAPAROSCOPIC;  Surgeon: Coralie Keens, MD;  Location: [facility];  Service: General;  Laterality: N/A;   PORT-A-CATH REMOVAL N/A 05/11/2019   Procedure: PARTIAL PORT REMOVAL;  Surgeon: Leighton Ruff, MD;  Location: [facility];  Service: General;  Laterality: N/A;   unilateral orchiectomy     VIDEO ASSISTED THORACOSCOPY (VATS)/WEDGE RESECTION Right 06/23/2018   Procedure: VIDEO ASSISTED THORACOSCOPY (VATS)/LUNG RESECTION, stapling and dissection of apical bleb, wedge resection of right upper lobe mass, lymph node dissection, intercostal nerve block;  Surgeon: Grace Isaac, MD;  Location: [facility];  Service: Thoracic;  Laterality: Right;   VIDEO BRONCHOSCOPY N/A 06/23/2018   Procedure: VIDEO BRONCHOSCOPY;  Surgeon: Grace Isaac, MD;  Location: [facility] OR;  Service: Thoracic;  Laterality: N/A;   Family History  Problem Relation Age of Onset   Diabetes Mother    Hypertension Mother    Cancer Mother        breast   Diabetes Father    Hypertension Father    Social History   Tobacco Use   Smoking status: Every Day    Packs/day: 0.10    Years: 27.00    Pack years: 2.70    Types: Cigars, Cigarettes   Smokeless tobacco: Never   Tobacco comments:    6 cigars a day- 12/31/2020   Vaping Use   Vaping Use: Never used  Substance Use Topics   Alcohol use: Yes    Alcohol/week: 0.0 standard drinks    Comment: 1.5 pt liquior day   Drug use: Not Currently    Comment: Past history of crack cocaine  use    Current Outpatient Medications  Medication Sig Dispense Refill   albuterol (VENTOLIN HFA) 108 (90 Base) MCG/ACT inhaler Inhale 1-2 puffs into the lungs every 6 (six) hours as needed for wheezing or shortness of breath. 6.7 g 12   bictegravir-emtricitabine-tenofovir AF (BIKTARVY) 50-200-25 MG TABS tablet Take 1 tablet by mouth daily. 30 tablet 11   Sodium Sulfate-Mag Sulfate-KCl (SUTAB) [id number] MG TABS Take 1 kit by mouth once for 1 dose. BIN: [id number] PCN: CN GROUP: [id number] MEMBER ID: [id number];ESP AS CASH;NO PRIOR AUTHORIZATION 24 tablet 0   budesonide-formoterol (SYMBICORT) 160-4.5 MCG/ACT inhaler Inhale 2 puffs into the lungs in the morning and at bedtime. 10.2 g 5   No current facility-administered medications for this visit.   Allergies  Allergen Reactions   Nsaids Other (See Comments)    DRUG INTERACTION WITH HIV MEDS   Sulfa Antibiotics Itching     Review of Systems: All systems reviewed and negative except where noted in HPI.    No results found.  Physical Exam: BP 110/80   Pulse 67   Ht '5\' 11"'  (1.803 m)   Wt 170 lb 6 oz  (77.3 kg)   BMI 23.76 kg/m  Constitutional: Pleasant,well-developed, African-American male in no acute distress. HEENT: Normocephalic and atraumatic. Conjunctivae are normal. No scleral icterus. Cardiovascular: Normal rate, regular rhythm.  Pulmonary/chest: Effort normal and breath sounds normal. No wheezing, rales or rhonchi. Abdominal: Soft, nondistended, nontender. Bowel sounds active throughout. There are no masses palpable. No hepatomegaly. Extremities: no edema Neurological: Alert and oriented to person place and time. Skin: Skin is warm and dry. No rashes noted. Psychiatric: Normal mood and affect. Behavior is normal.  CBC    Component Value Date/Time   WBC 2.8 (L) 07/24/2020 1509  RBC 3.71 (L) 07/24/2020 1509   HGB 13.8 07/24/2020 1509   HGB 14.7 11/14/2019 1021   HCT 39.2 07/24/2020 1509   HCT 39.9 11/14/2019 1021   PLT 134 (L) 07/24/2020 1509   PLT 153 11/14/2019 1021   MCV 105.7 (H) 07/24/2020 1509   MCV 104 (H) 11/14/2019 1021   MCH 37.2 (H) 07/24/2020 1509   MCHC 35.2 07/24/2020 1509   RDW 12.8 07/24/2020 1509   RDW 12.8 11/14/2019 1021   LYMPHSABS 1.4 01/10/2020 0223   LYMPHSABS 1.1 11/14/2019 1021   MONOABS 0.5 01/10/2020 0223   EOSABS 0.3 01/10/2020 0223   EOSABS 0.1 11/14/2019 1021   BASOSABS 0.0 01/10/2020 0223   BASOSABS 0.0 11/14/2019 1021    CMP     Component Value Date/Time   NA 141 07/24/2020 1509   NA 139 03/24/2019 1328   K 4.6 07/24/2020 1509   CL 106 07/24/2020 1509   CO2 30 07/24/2020 1509   GLUCOSE 76 07/24/2020 1509   BUN 13 07/24/2020 1509   BUN 17 03/24/2019 1328   CREATININE 1.26 07/24/2020 1509   CALCIUM 8.8 07/24/2020 1509   PROT 6.1 07/24/2020 1509   ALBUMIN 4.4 01/10/2020 0223   AST 33 07/24/2020 1509   AST 41 06/18/2018 1114   ALT 20 07/24/2020 1509   ALT 35 06/18/2018 1114   ALKPHOS 51 01/10/2020 0223   BILITOT 1.4 (H) 07/24/2020 1509   BILITOT 1.0 06/18/2018 1114   GFRNONAA >60 01/12/2020 0222   GFRNONAA 63  06/20/2019 1026   GFRAA >60 01/12/2020 0222   GFRAA 73 06/20/2019 1026  CLINICAL DATA:  Lower abdominal pain   EXAM: CT ABDOMEN AND PELVIS WITH CONTRAST   TECHNIQUE: Multidetector CT imaging of the abdomen and pelvis was performed using the standard protocol following bolus administration of intravenous contrast.   CONTRAST:  115m OMNIPAQUE IOHEXOL 300 MG/ML  SOLN   COMPARISON:  May 22, 2018   FINDINGS: Lower chest: There is scarring in the lung bases. There is no lung base edema or airspace opacity.   Hepatobiliary: There is hepatic steatosis. No focal liver lesions are appreciable. Gallbladder wall is not appreciably thickened. There is no biliary duct dilatation.   Pancreas: No pancreatic mass or inflammatory focus.   Spleen: No splenic lesions are evident.   Adrenals/Urinary Tract: Adrenals bilaterally appear unremarkable. There is a 5 x 4 mm probable cyst along the lateral mid left kidney. There is no evident hydronephrosis on either side. There is no evident renal or ureteral calculus on either side. Urinary bladder is midline with wall thickness within normal limits.   Stomach/Bowel: There is no appreciable bowel wall or mesenteric thickening. The terminal ileum appears normal. There is no bowel obstruction. There is no free air or portal venous air.   Vascular/Lymphatic: No abdominal aortic aneurysm. No arterial vascular lesions are evident. Major venous structures appear patent. There is no evident adenopathy in the abdomen or pelvis.   Reproductive: Prostate and seminal vesicles are normal in size and contour. No evident pelvic mass.   Other: The appendix is distended, measuring up to 12 mm in length. There is equivocal enhancement along portions of the wall of the appendix. There is no appendicolith evident. There is no associated abscess or perforation appreciable.   No abscess or ascites evident in the abdomen or pelvis.   Musculoskeletal: No  blastic or lytic bone lesions. No intramuscular or abdominal wall lesions are evident.   IMPRESSION: 1.  Findings indicative of acute  appendicitis.   Appendix: Location: Appendix arises inferomedially from the cecum with the appendix in the right pelvis at the mid iliac crest level.   Diameter: Up to 12 mm   Appendicolith: None   Mucosal hyper-enhancement: Mild   Extraluminal gas: None   Periappendiceal collection: None   2. No bowel wall thickening or bowel obstruction. No abscess in the abdomen or pelvis.   3. No renal or ureteral calculus. No hydronephrosis. Urinary bladder wall thickness normal.   4.  Hepatic steatosis.   Critical Value/emergent results were called by telephone at the time of interpretation on 01/10/2020 at 10:05 am to provider Mali Grose, RN, who verbally acknowledged these results.     Electronically Signed   By: Lowella Grip III M.D.   On: 01/10/2020 10:09    ASSESSMENT AND PLAN: 49 year old male with history of anal cancer treated with chemotherapy and radiation in 2019 with no evidence of disease on CT in 2021, with recent episode of constipation and hematochezia.  I suspect that the hematochezia is secondary to internal hemorrhoids.  It is unclear if or when he has ever had a colonoscopy, but he knows he has not had one in the last year.  Given the new onset hematochezia, a colonoscopy is indicated. The patient is also complaining of chest tightness, cough and dyspnea which are symptoms that have been previously attributed to his emphysema.  He asked for a refill of Symbicort, which I will do for him with his pulmonologist approval.  I sent a message to his pulmonologist to make sure that this was okay.  I will also get a chest x-ray to look for any infiltrates.  His lungs were clear to auscultation and he did not have any increased work of breathing.  I advised him to follow-up with his pulmonologist, if his symptoms or not improving once he is  back on his Symbicort.  Hematochezia - Colonoscopy  Dyspnea, chest tightness - Patient has had similar presentations in the electronic medical record which have been attributed to his COPD - Resume Symbicort, follow-up with pulmonology if symptoms not improving  History of anal cancer - Follow-up with Dr. Burr Medico in oncology   Sid Falcon, MD

## 2021-01-30 NOTE — Telephone Encounter (Signed)
Okay thanks for the updates.

## 2021-01-31 NOTE — Progress Notes (Signed)
[name],  Your chest x-ray looked okay.  No acute findings or evidence of pneumonia.  Please follow up with [name] if your symptoms aren't improving with the Spiriva.

## 2021-02-06 ENCOUNTER — Encounter: Payer: Self-pay | Admitting: Internal Medicine

## 2021-02-06 ENCOUNTER — Ambulatory Visit (INDEPENDENT_AMBULATORY_CARE_PROVIDER_SITE_OTHER): Payer: Medicaid Other

## 2021-02-06 ENCOUNTER — Other Ambulatory Visit: Payer: Self-pay

## 2021-02-06 ENCOUNTER — Ambulatory Visit (INDEPENDENT_AMBULATORY_CARE_PROVIDER_SITE_OTHER): Payer: Medicaid Other | Admitting: Internal Medicine

## 2021-02-06 DIAGNOSIS — F17210 Nicotine dependence, cigarettes, uncomplicated: Secondary | ICD-10-CM

## 2021-02-06 DIAGNOSIS — M25512 Pain in left shoulder: Secondary | ICD-10-CM | POA: Diagnosis not present

## 2021-02-06 DIAGNOSIS — J8410 Pulmonary fibrosis, unspecified: Secondary | ICD-10-CM | POA: Diagnosis not present

## 2021-02-06 DIAGNOSIS — Z23 Encounter for immunization: Secondary | ICD-10-CM

## 2021-02-06 DIAGNOSIS — C210 Malignant neoplasm of anus, unspecified: Secondary | ICD-10-CM | POA: Diagnosis not present

## 2021-02-06 DIAGNOSIS — B20 Human immunodeficiency virus [HIV] disease: Secondary | ICD-10-CM | POA: Diagnosis not present

## 2021-02-06 DIAGNOSIS — R200 Anesthesia of skin: Secondary | ICD-10-CM | POA: Diagnosis not present

## 2021-02-06 DIAGNOSIS — R202 Paresthesia of skin: Secondary | ICD-10-CM | POA: Diagnosis not present

## 2021-02-06 MED ORDER — BIKTARVY 50-200-25 MG PO TABS: 1.0000 | ORAL_TABLET | Freq: Every day | ORAL | 11 refills | Status: DC

## 2021-02-06 NOTE — Progress Notes (Signed)
Patient Active Problem List   Diagnosis Date Noted   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[place]) 01/06/2018    Priority: 1.   Anal cancer ([place]) 11/07/2015    Priority: 2.   Human immunodeficiency virus (HIV) disease ([place]) 05/19/2013    Priority: High   Left shoulder pain 12/31/2020   Numbness and tingling in right hand 12/31/2020   Allergic rhinitis 10/24/2020   Insomnia 10/24/2020   Healthcare maintenance 10/24/2020   Tick bite 08/08/2020   Headache 06/11/2020   Exposure to syphilis 06/11/2020   Blurry vision, bilateral 06/11/2020   Anal fissure 08/02/2019   Foot pain, left 05/20/2019   Hypertension 02/21/2019   Screening for diabetes mellitus 02/21/2019   Anxiety 09/29/2018   Lung nodule 06/23/2018   External hemorrhoids 02/25/2015   Cigarette smoker 05/19/2013   Depression 05/19/2013   Dental caries 05/19/2013   Hx of unilateral orchiectomy 05/19/2013   Anal warts 05/06/2013    Patient's Medications  New Prescriptions   No medications on file  Previous Medications   ALBUTEROL (VENTOLIN HFA) 108 (90 BASE) MCG/ACT INHALER    Inhale 1-2 puffs into the lungs every 6 (six) hours as needed for wheezing or shortness of breath.   BUDESONIDE-FORMOTEROL (SYMBICORT) 160-4.5 MCG/ACT INHALER    Inhale 2 puffs into the lungs in the morning and at bedtime.  Modified Medications   Modified Medication Previous Medication   BICTEGRAVIR-EMTRICITABINE-TENOFOVIR AF (BIKTARVY) 50-200-25 MG TABS TABLET bictegravir-emtricitabine-tenofovir AF (BIKTARVY) 50-200-25 MG TABS tablet      Take 1 tablet by mouth daily.    Take 1 tablet by mouth daily.  Discontinued Medications   No medications on file    Subjective: Jeremiah [name] is in for Jeremiah [name] routine HIV follow-up visit.  Jeremiah [name] has not had any problems obtaining, taking or tolerating Jeremiah [name] Biktarvy.  Jeremiah [name] can recall missing only 1 dose since Jeremiah [name] last visit.  Jeremiah [name] is scheduled for a colonoscopy on 02/21/2021 to evaluate intermittent hematochezia.  Jeremiah [name] is not having  any rectal pain or discomfort.  Jeremiah [name] cannot recall when Jeremiah [name] last saw Jeremiah [name] general surgeon or oncologist as follow-up for Jeremiah [name] history of anal cancer.  Jeremiah [name] is still smoking about 6 cigars daily.  Jeremiah [name] says that Jeremiah [name] is bothered by some anxiety but does not feel depressed.  Over the last month Jeremiah [name] has been having some pain in Jeremiah [name] anterior left shoulder.  Jeremiah [name] says that Jeremiah [name] thinks Jeremiah [name] may have aggravated it or working out.  Jeremiah [name] is also noted some intermittent throbbing pain in Jeremiah [name] right hand.  It only occurs at night.  Sometimes it is severe enough to wake him up.  Review of Systems: Review of Systems  Constitutional:  Negative for fever and weight loss.  Respiratory:  Positive for cough and shortness of breath. Negative for hemoptysis and sputum production.   Cardiovascular:  Negative for chest pain.  Gastrointestinal:  Positive for blood in stool. Negative for abdominal pain, constipation, diarrhea, nausea and vomiting.  Musculoskeletal:  Positive for joint pain.  Psychiatric/Behavioral:  Negative for depression. The patient is nervous/anxious.    Past Medical History:  Diagnosis Date   Anal cancer ([place])    Anal intraepithelial neoplasia I (AIN I)    Anal warts    Appendicitis    Carpal tunnel syndrome    Right   COPD (chronic obstructive pulmonary disease) ([place])    Depression    Dyslipidemia 05/19/2013   Emphysema lung ([place])    Hemorrhoids 02/25/2015  Hepatic steatosis 2021   History of chlamydia 05/06/2013   Treated 2014   History of tuberculin skin testing within last year 2014   HIV (human immunodeficiency virus infection) ([place])    Hypertension    Internal hemorrhoids    Latent syphilis    Lung nodule    Right upper lobe   Pneumonia 2007   x2   Pneumothorax 07/01/2018   LEFT   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[place]) 01/06/2018   06/23/2018-surgical pathology report- lung wedge biopsy necrotizing granulomatous inflammation associated fibrosis, no evidence of malignancy   Rectal cancer ([place])    Right sided  abdominal pain 01/09/2020   S/P laparoscopic appendectomy 01/10/2020   Tinea pedis     Social History   Tobacco Use   Smoking status: Every Day    Packs/day: 0.10    Years: 27.00    Pack years: 2.70    Types: Cigars, Cigarettes   Smokeless tobacco: Never   Tobacco comments:    6 cigars a day- 12/31/2020   Vaping Use   Vaping Use: Never used  Substance Use Topics   Alcohol use: Yes    Alcohol/week: 0.0 standard drinks    Comment: 1.5 pt liquior day   Drug use: Not Currently    Comment: Past history of crack cocaine  use     Family History  Problem Relation Age of Onset   Diabetes Mother    Hypertension Mother    Cancer Mother        breast   Diabetes Father    Hypertension Father     Allergies  Allergen Reactions   Nsaids Other (See Comments)    DRUG INTERACTION WITH HIV MEDS   Sulfa Antibiotics Itching    Health Maintenance  Topic Date Due   Zoster Vaccines- Shingrix (1 of 2) Never done   COLONOSCOPY (Pts 45-28yrs Insurance coverage will need to be confirmed)  Never done   Pneumococcal Vaccine 45-43 Years old (3 - PPSV23 or PCV20) 09/29/2019   COVID-19 Vaccine (2 - Pfizer risk series) 03/06/2020   TETANUS/TDAP  05/19/2029   INFLUENZA VACCINE  Completed   Hepatitis C Screening  Completed   HIV Screening  Completed   HPV VACCINES  Aged Out    Objective:  Vitals:   02/06/21 1057  BP: 120/79  Pulse: 65  Temp: 98.1 F (36.7 C)  TempSrc: Oral  SpO2: 98%  Weight: 167 lb (75.8 kg)   Body mass index is 23.29 kg/m.  Physical Exam Constitutional:      Comments: Jeremiah [name] spirits are good.  Cardiovascular:     Rate and Rhythm: Normal rate and regular rhythm.     Heart sounds: No murmur heard. Pulmonary:     Effort: Pulmonary effort is normal.     Comments: Jeremiah [name] has distant breath sounds. Musculoskeletal:     Comments: Jeremiah [name] has some tenderness with palpation over Jeremiah [name] anterior left shoulder.  There is no unusual swelling or redness.  Pain is exacerbated with  abduction.  Jeremiah [name] has normal grip strength in Jeremiah [name] right hand.  Pulses are normal.  Skin:    Findings: No rash.  Psychiatric:        Mood and Affect: Mood normal.    Lab Results Lab Results  Component Value Date   WBC 2.8 (L) 07/24/2020   HGB 13.8 07/24/2020   HCT 39.2 07/24/2020   MCV 105.7 (H) 07/24/2020   PLT 134 (L) 07/24/2020    Lab Results  Component Value Date   CREATININE  1.26 07/24/2020   BUN 13 07/24/2020   NA 141 07/24/2020   K 4.6 07/24/2020   CL 106 07/24/2020   CO2 30 07/24/2020    Lab Results  Component Value Date   ALT 20 07/24/2020   AST 33 07/24/2020   ALKPHOS 51 01/10/2020   BILITOT 1.4 (H) 07/24/2020    Lab Results  Component Value Date   CHOL 127 07/24/2020   HDL 55 07/24/2020   LDLCALC 47 07/24/2020   TRIG 177 (H) 07/24/2020   CHOLHDL 2.3 07/24/2020   Lab Results  Component Value Date   LABRPR REACTIVE (A) 07/24/2020   RPRTITER 1:1 (H) 07/24/2020   HIV 1 RNA Quant  Date Value  01/23/2021 Not Detected Copies/mL  07/24/2020 Not Detected Copies/mL  01/10/2020 <20 copies/mL   CD4 T Cell Abs (/uL)  Date Value  01/23/2021 242 (L)  07/24/2020 274 (L)  02/14/2020 253 (L)     Problem List Items Addressed This Visit       1.   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[place])     2.   Anal cancer [facility])    Jeremiah [name] upcoming colonoscopy should help verify that Jeremiah [name] anal cancer remains in remission.      Relevant Medications   bictegravir-emtricitabine-tenofovir AF (BIKTARVY) 50-200-25 MG TABS tablet     Unprioritized   Cigarette smoker    Jeremiah [name] has not ready to quit smoking currently.      Left shoulder pain    I suspect that Jeremiah [name] has left rotator cuff tendinopathy.  Has not been lifting weights recently but has not noticed any improvement.  I suggested that Jeremiah [name] follow-up with Jeremiah [name] PCP in the next month or 2 if the pain does not improve.      Numbness and tingling in right hand    Jeremiah [name] PCP evaluated him recently and felt that Jeremiah [name] probably had carpal tunnel  syndrome.  Jeremiah [name] recommended having Jeremiah [name] wear a wrist brace at night but Jeremiah [name] has not been doing that.  We will encourage him to use the brace to see if that helps.        High   Human immunodeficiency virus (HIV) disease ([place])    Jeremiah [name] infection remains under excellent, long-term control.  Jeremiah [name] has had Jeremiah [name] influenza vaccine and received Jeremiah [name] COVID booster here today.  Jeremiah [name] will continue Biktarvy and follow-up after lab work in 6 months.      Relevant Medications   bictegravir-emtricitabine-tenofovir AF (BIKTARVY) 50-200-25 MG TABS tablet   Other Relevant Orders   T-helper cell (CD4)- ([facility] clinic only)   HIV-1 RNA quant-no reflex-bld   CBC   Comprehensive metabolic panel   Lipid panel   RPR      Jeremiah Bickers, MD [facility] for Infectious [place] [phone] pager   [phone] cell 02/06/2021, 11:27 AM

## 2021-02-06 NOTE — Assessment & Plan Note (Signed)
Jeremiah [name]'s PCP evaluated him recently and felt that he probably had carpal tunnel syndrome.  He recommended having Jeremiah [name] wear a wrist brace at night but Jeremiah [name] has not been doing that.  We will encourage him to use the brace to see if that helps.

## 2021-02-06 NOTE — Assessment & Plan Note (Signed)
His upcoming colonoscopy should help verify that his anal cancer remains in remission.

## 2021-02-06 NOTE — Assessment & Plan Note (Signed)
I suspect that he has left rotator cuff tendinopathy.  Has not been lifting weights recently but has not noticed any improvement.  I suggested that he follow-up with his PCP in the next month or 2 if the pain does not improve.

## 2021-02-06 NOTE — Assessment & Plan Note (Signed)
He has not ready to quit smoking currently.

## 2021-02-06 NOTE — Progress Notes (Signed)
   Covid-19 Vaccination Clinic  Name:  Jeremiah [name]    MRN: [id number] DOB: 05-Jun-1970  02/06/2021  [name] was observed post Covid-19 immunization for 15 minutes without incident. He was provided with Vaccine Information Sheet and instruction to access the V-Safe system.   [name] was instructed to call 911 with any severe reactions post vaccine: Difficulty breathing  Swelling of face and throat  A fast heartbeat  A bad rash all over body  Dizziness and weakness   Immunizations Administered     Name Date Dose VIS Date Route   Pfizer Covid-19 Vaccine Bivalent Booster 02/06/2021 11:20 AM 0.3 mL 12/19/2020 Intramuscular   Manufacturer: [employer]   Lot: [place]: [id number]      Landis Gandy, RN

## 2021-02-06 NOTE — Assessment & Plan Note (Signed)
His infection remains under excellent, long-term control.  He has had his influenza vaccine and received his COVID booster here today.  He will continue Biktarvy and follow-up after lab work in 6 months.

## 2021-02-18 ENCOUNTER — Telehealth: Payer: Self-pay | Admitting: Gastroenterology

## 2021-02-18 NOTE — Telephone Encounter (Signed)
Hey Dr. Candis Schatz,   Patient called in to cancel procedure 11/3 due to working out of town. He rescheduled for 12/16.  Thank you.

## 2021-02-21 ENCOUNTER — Encounter: Payer: Medicaid Other | Admitting: Gastroenterology

## 2021-03-11 ENCOUNTER — Ambulatory Visit: Payer: Medicaid Other | Admitting: Podiatry

## 2021-03-26 ENCOUNTER — Ambulatory Visit (INDEPENDENT_AMBULATORY_CARE_PROVIDER_SITE_OTHER): Payer: Medicaid Other | Admitting: Podiatry

## 2021-03-26 ENCOUNTER — Other Ambulatory Visit: Payer: Self-pay

## 2021-03-26 DIAGNOSIS — Z91199 Patient's noncompliance with other medical treatment and regimen due to unspecified reason: Secondary | ICD-10-CM

## 2021-03-26 NOTE — Progress Notes (Signed)
Patient was no-show for appointment today 

## 2021-03-27 ENCOUNTER — Other Ambulatory Visit (HOSPITAL_COMMUNITY): Payer: Self-pay

## 2021-03-27 ENCOUNTER — Telehealth: Payer: Self-pay

## 2021-03-27 NOTE — Telephone Encounter (Signed)
Spoke with [facility], they have confirmation that Biktarvy was delivered 03/13/21. Spoke with patient, he believes it was stolen from his porch.   Per Butch Penny, insurance will cover next shipment on 12/13. Patient says he can come tomorrow morning 12/8 to pick up one bottle of Biktarvy samples.   Beryle Flock, RN

## 2021-03-27 NOTE — Telephone Encounter (Signed)
-----   Message from [place] sent at 03/27/2021  8:53 AM EST ----- Patient called in regards to his medication, said the [facility] said he delivered it and took a picture of it but patient stated he never received the medication and needs someone to call him so he can get his medication. Thank you

## 2021-03-28 ENCOUNTER — Other Ambulatory Visit: Payer: Self-pay | Admitting: Pharmacist

## 2021-03-28 DIAGNOSIS — B20 Human immunodeficiency virus [HIV] disease: Secondary | ICD-10-CM

## 2021-03-28 MED ORDER — BIKTARVY 50-200-25 MG PO TABS: 1.0000 | ORAL_TABLET | Freq: Every day | ORAL | 0 refills | Status: AC

## 2021-03-28 NOTE — Telephone Encounter (Signed)
Brianna with [facility] to deliver samples to patient.   Beryle Flock, RN

## 2021-03-28 NOTE — Progress Notes (Signed)
Medication Samples have been provided to the patient.  Drug name: Biktarvy        Strength: 50/200/25 mg       Qty: 1 bottle (7 tablets)   LOT: [id number]   Exp.Date: 01/20/2023  Dosing instructions: Take one tablet by mouth once daily  The patient has been instructed regarding the correct time, dose, and frequency of taking this medication, including desired effects and most common side effects.   Evelisse Szalkowski L. Quamesha Mullet, PharmD, BCIDP, AAHIVP, CPP Clinical Pharmacist Practitioner Infectious Diseases Clinical Pharmacist [facility] 04/02/2020, 10:07 AM  

## 2021-03-28 NOTE — Telephone Encounter (Signed)
Spoke with patient, says he isn't able to come by the office to pick up his medication today. Says he will come in tomorrow.   Beryle Flock, RN

## 2021-04-04 ENCOUNTER — Telehealth: Payer: Self-pay | Admitting: Gastroenterology

## 2021-04-04 ENCOUNTER — Ambulatory Visit (INDEPENDENT_AMBULATORY_CARE_PROVIDER_SITE_OTHER): Payer: Self-pay | Admitting: Podiatry

## 2021-04-04 DIAGNOSIS — Z91199 Patient's noncompliance with other medical treatment and regimen due to unspecified reason: Secondary | ICD-10-CM

## 2021-04-04 NOTE — Progress Notes (Signed)
Patient was no-show for appointment today 

## 2021-04-04 NOTE — Telephone Encounter (Signed)
Hey Dr. Candis Schatz,   Patient called in to cancel procedure for 12/16 for schedule conflict. Patient rescheduled for 2/2.  Thank you

## 2021-04-05 ENCOUNTER — Encounter: Payer: Medicaid Other | Admitting: Gastroenterology

## 2021-05-07 ENCOUNTER — Ambulatory Visit (INDEPENDENT_AMBULATORY_CARE_PROVIDER_SITE_OTHER): Payer: Medicaid Other | Admitting: Podiatry

## 2021-05-07 DIAGNOSIS — Z91199 Patient's noncompliance with other medical treatment and regimen due to unspecified reason: Secondary | ICD-10-CM

## 2021-05-07 NOTE — Progress Notes (Signed)
Patient was no-show for appointment today 

## 2021-05-15 ENCOUNTER — Telehealth: Payer: Self-pay | Admitting: *Deleted

## 2021-05-16 ENCOUNTER — Other Ambulatory Visit (HOSPITAL_COMMUNITY): Payer: Self-pay

## 2021-05-16 ENCOUNTER — Telehealth: Payer: Self-pay | Admitting: Pharmacy Technician

## 2021-05-16 NOTE — Telephone Encounter (Signed)
Patient Advocate Encounter  Received notification from [facility] that prior authorization for TRELEGY is required.   PA submitted on 1.26.23 [id number]# [id number]  Step Therapy required: Pt recently filled Symbicort on 1.20.23.  Ruthe Mannan is the next preferred, then Advair(which would still require a PA) Status is pending   [facility] will continue to follow  Luciano Cutter, CPhT Patient Advocate Phone: [phone] Fax:  [phone]

## 2021-05-16 NOTE — Telephone Encounter (Signed)
Received a fax regarding Prior Authorization from [facility] for [place]. Authorization has been DENIED because STEP THERAPY IS REQUIRED. PT RECENTLY HAD SYMBICORT FILLED. NEXT IN STEP THERAPY WOULD BE [place], [place]

## 2021-05-20 ENCOUNTER — Other Ambulatory Visit (HOSPITAL_COMMUNITY)
Admission: RE | Admit: 2021-05-20 | Discharge: 2021-05-20 | Disposition: A | Discharge status: Home / Self Care | Payer: Medicaid Other | Source: Ambulatory Visit | Attending: Internal Medicine | Admitting: Internal Medicine

## 2021-05-20 ENCOUNTER — Other Ambulatory Visit: Payer: Self-pay

## 2021-05-20 ENCOUNTER — Encounter: Payer: Self-pay | Admitting: Internal Medicine

## 2021-05-20 ENCOUNTER — Ambulatory Visit (INDEPENDENT_AMBULATORY_CARE_PROVIDER_SITE_OTHER): Payer: Medicaid Other | Admitting: Student

## 2021-05-20 VITALS — BP 135/87 | HR 75 | Temp 97.5°F | Ht 71.0 in | Wt 176.0 lb

## 2021-05-20 DIAGNOSIS — N481 Balanitis: Secondary | ICD-10-CM

## 2021-05-20 DIAGNOSIS — N489 Disorder of penis, unspecified: Secondary | ICD-10-CM

## 2021-05-20 DIAGNOSIS — B20 Human immunodeficiency virus [HIV] disease: Secondary | ICD-10-CM

## 2021-05-20 MED ORDER — DOXYCYCLINE HYCLATE 50 MG PO CAPS: 100.0000 mg | ORAL_CAPSULE | Freq: Two times a day (BID) | ORAL | 0 refills | Status: AC

## 2021-05-20 MED ORDER — MOMETASONE FURO-FORMOTEROL FUM 200-5 MCG/ACT IN AERO: 2.0000 | INHALATION_SPRAY | Freq: Two times a day (BID) | RESPIRATORY_TRACT | 5 refills | Status: AC

## 2021-05-20 NOTE — Patient Instructions (Addendum)
For the swelling of your genitalia at the head we will prescribe you an antibiotic doxycycline and continue the valtrex.   We will place a referral for you to see urology.   If the swelling keeps getting bigger and ultimately prevents you from peeing or if you get severe pain please come to the emergency room.

## 2021-05-20 NOTE — Progress Notes (Deleted)
° °  CC: penis swelling  HPI:  [name] is a 51 y.o. with a past medical history listed below presenting for evaluation of penis swelling. For details of today's visit and the status of his chronic medical issues please refer to the assessment and plan.   Past Medical History:  Diagnosis Date   Anal cancer ([place])    Anal intraepithelial neoplasia I (AIN I)    Anal warts    Appendicitis    Carpal tunnel syndrome    Right   COPD (chronic obstructive pulmonary disease) ([place])    Depression    Dyslipidemia 05/19/2013   Emphysema lung ([place])    Hemorrhoids 02/25/2015   Hepatic steatosis 2021   History of chlamydia 05/06/2013   Treated 2014   History of tuberculin skin testing within last year 2014   HIV (human immunodeficiency virus infection) ([place])    Hypertension    Internal hemorrhoids    Latent syphilis    Lung nodule    Right upper lobe   Pneumonia 2007   x2   Pneumothorax 07/01/2018   LEFT   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[place]) 01/06/2018   06/23/2018-surgical pathology report- lung wedge biopsy necrotizing granulomatous inflammation associated fibrosis, no evidence of malignancy   Rectal cancer ([place])    Right sided abdominal pain 01/09/2020   S/P laparoscopic appendectomy 01/10/2020   Tinea pedis    Review of Systems:   ROS   Physical Exam:  There were no vitals filed for this visit.  Physical Exam General: alert, appears stated age, in no acute distress HEENT: Normocephalic, atraumatic, EOM intact, conjunctiva normal CV: Regular rate and rhythm, no murmurs rubs or gallops Pulm: Clear to auscultation bilaterally, normal work of breathing Abdomen: Soft, nondistended, bowel sounds present, no tenderness to palpation MSK: No lower extremity edema Skin: Warm and dry Neuro: Alert and oriented x3   Assessment & Plan:   See Encounters Tab for problem based charting.  Patient {GC/GE:[id number]::"discussed with","seen with"} Dr.  {NAMES:[id number]::"Guilloud","Hoffman","Mullen","Narendra","Williams","Vincent"}

## 2021-05-20 NOTE — Telephone Encounter (Signed)
Please send in a prescription for Dulera 200.  Take 2 inhalations twice daily

## 2021-05-20 NOTE — Assessment & Plan Note (Signed)
Patient reports that he had unprotected sex about 2 weeks ago.  About a week after that he noticed a small laceration on the glans of the penis.  This lesion eventually scarred over and developed into a lump.  He eventually started developing worsening swelling prompting him to present to the health department in [facility]. He was tested for STIs and prescribed valtrex and told to use OTC monistat cream. He does note that he has had some itching and mild pain, he denies rash, ulcer, fever, chills, discharge, dysuria (though he does note his urinary stream is becoming more distorted due to the swelling of the glans). He has a history of HIV with most recent labs demonstrating undetectable viral load and CD4 count of 242, and he has a distant history of syphilis that was treated. His symptoms did not improve with the miconazole cream and valtrex.    On exam, patient was noted to have an uncircumcised penis, foreskin was unable to be retracted, no discharge or open wounds were present. On the Distal aspect of the dorsum of the penile shaft just prior to glans there was a firmness. No overlying skin changes. Edematous.   A/P: Patient likely has balanitis complicated by phimosis. Patient is at increased risk for this given he is uncircumcised. Given his recent unprotected sexual encounter suspect the balanitis is secondary to an STI. There is the possibility of other GU bacteria  Causing infections leading to the swelling. Also considered fungal infection as well.   Urology was curbsided appreciate there assistance.  Plan: Prescribe doxycycline 100mg  BID for 7 d course, gave patient instructions for daily genital hygiene, and referred him to urology urgently

## 2021-05-20 NOTE — Telephone Encounter (Signed)
Dulera 200 prescription sent to requested [facility].  Nothing further at this time.

## 2021-05-20 NOTE — Progress Notes (Signed)
° °  CC: worsening swelling of penis head   HPI:  [name] is a 51 y.o. M with PMH per below who presents to clinic for worsening swelling of the glans of the penis. Please see problem based charting under encounters tab for further details.   Past Medical History:  Diagnosis Date   Anal cancer ([place])    Anal intraepithelial neoplasia I (AIN I)    Anal warts    Appendicitis    Carpal tunnel syndrome    Right   COPD (chronic obstructive pulmonary disease) ([place])    Depression    Dyslipidemia 05/19/2013   Emphysema lung ([place])    Hemorrhoids 02/25/2015   Hepatic steatosis 2021   History of chlamydia 05/06/2013   Treated 2014   History of tuberculin skin testing within last year 2014   HIV (human immunodeficiency virus infection) ([place])    Hypertension    Internal hemorrhoids    Latent syphilis    Lung nodule    Right upper lobe   Pneumonia 2007   x2   Pneumothorax 07/01/2018   LEFT   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[place]) 01/06/2018   06/23/2018-surgical pathology report- lung wedge biopsy necrotizing granulomatous inflammation associated fibrosis, no evidence of malignancy   Rectal cancer ([place])    Right sided abdominal pain 01/09/2020   S/P laparoscopic appendectomy 01/10/2020   Tinea pedis    Review of Systems:  Please see problem based charting under encounters tab for further details.   Physical Exam:  Vitals:   05/20/21 1048 05/20/21 1058  BP: (!) 142/86 135/87  Pulse: 74 75  Temp: (!) 97.5 F (36.4 C)   TempSrc: Oral   SpO2: 100%   Weight: 176 lb (79.8 kg)   Height: 5\' 11"  (1.803 m)    Constitutional: Well-developed, well-nourished, and in no distress.  HENT:  Head: Normocephalic and atraumatic.  Eyes: EOM are normal.  Neck: Normal range of motion.  Cardiovascular: Normal rate, regular rhythm, intact distal pulses. No gallop and no friction rub.  No murmur heard. No lower extremity edema  Pulmonary: Non labored breathing on room air, no wheezing or rales   Abdominal: Soft. Normal bowel sounds. Non distended and non tender Musculoskeletal: Normal range of motion.        General: No tenderness or edema.  GU: (please see media tab for images), no discoloration of overlying skin of the penile shaft, no structural abnormalities of penile shaft. Glans penis swollen with just proximal area of glans on distal shaft with firmness on dorsal aspect of penis. Uncircumsized, skin unable to be retracted, no visible lacerations or discharge.  Neurological: Alert and oriented to person, place, and time. Non focal  Skin: Skin is warm and dry.    Assessment & Plan:   See Encounters Tab for problem based charting.  Patient seen with Dr.  Cain Sieve

## 2021-05-20 NOTE — Addendum Note (Signed)
Addended by: Elton Sin on: 05/20/2021 11:52 AM   Modules accepted: Orders

## 2021-05-21 ENCOUNTER — Telehealth: Payer: Self-pay | Admitting: Gastroenterology

## 2021-05-21 LAB — URINE CYTOLOGY ANCILLARY ONLY
Chlamydia: POSITIVE — AB
Comment: NEGATIVE
Comment: NORMAL
Neisseria Gonorrhea: NEGATIVE

## 2021-05-21 NOTE — Telephone Encounter (Signed)
Patient called and stated that he has a colonoscopy on 2/2. States that he is on antibiotics and is wanting to see if that's okay. Seeking advice, please advise.

## 2021-05-21 NOTE — Telephone Encounter (Signed)
Pt was placed on valtrex last week but his symptoms did not get any better. Yesterday he was placed on doxycyline for 7 days for balanitis probably caused by an STI. He has been seen by the health department and internal medicine. Pt wants to know if his colon should be rescheduled. He is more than willing to reschedule if needed. Please advise.

## 2021-05-21 NOTE — Telephone Encounter (Signed)
°  Jeremiah November, MD  Algernon Huxley, RN Caller: Unspecified (Today,  8:56 AM) The colonoscopy does not need to be cancelled just because he is on antibiotics and is being treated for a localized infection.  If he feels well enough to undergoing the bowel prep then I would recommend he proceed with the procedure. If he is not feeling well, he can reschedule       Spoke with pt and he would prefer to reschedule his colon appt. Rescheduled to 06/06/21@1 :30pm, new instructions sent to pt via epic.

## 2021-05-22 ENCOUNTER — Encounter: Payer: Self-pay | Admitting: *Deleted

## 2021-05-22 ENCOUNTER — Telehealth: Payer: Self-pay | Admitting: Student

## 2021-05-22 LAB — RPR: RPR Ser Ql: REACTIVE — AB

## 2021-05-22 LAB — RPR, QUANT+TP ABS (REFLEX)
Rapid Plasma Reagin, Quant: 1:1 {titer} — ABNORMAL HIGH
T Pallidum Abs: REACTIVE — AB

## 2021-05-22 NOTE — Telephone Encounter (Signed)
Called patient to update him that he tested positive for chlamydia. Informed him that he should also let his recent sexual partners know to go seek treatment. In addition patient's RPR was positive. His titer however is 1:1 which is lower than it has previously been. This most likely indicates history of infection rather than an old infection.   Patient states that his symptoms are not improving yet in terms of the swelling but he has no difficulty urinating currently. He took about 2 days of the doxycycline and when he went back to [place] health department they discontinued this medication and gave him two days of azithromycin.   Discussed with patient to use barrier protection when engaging in sexual activity to hopefully limit the possibility of reinfection. We also discussed that if his swelling does not improve with treatment or continues to worsen he should give [place] a call.   Patient is awaiting an appointment with urology.

## 2021-05-22 NOTE — Progress Notes (Signed)
05-22-2021   Faxed Confidential Communicable Disease Report to [facility]  Maryan Rued, PBT 05-22-2021 [phone]

## 2021-05-23 ENCOUNTER — Encounter: Payer: Medicaid Other | Admitting: Gastroenterology

## 2021-05-24 NOTE — Progress Notes (Signed)
Internal Medicine Clinic Attending  I saw and evaluated the patient.  I personally confirmed the key portions of the history and exam documented by Dr. Eulas Post and I reviewed pertinent patient test results.  The assessment, diagnosis, and plan were formulated together and I agree with the documentation in the residents note.   Patient with balanitis and stable vitals signs. I'm not able to fully retract his foreskin secondary to swelling.  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 I don't think this represents new infection GU referral for balanitis

## 2021-05-27 ENCOUNTER — Ambulatory Visit: Payer: Medicaid Other | Admitting: Podiatry

## 2021-05-28 ENCOUNTER — Other Ambulatory Visit (HOSPITAL_COMMUNITY): Payer: Self-pay

## 2021-06-06 ENCOUNTER — Encounter: Payer: Self-pay | Admitting: Gastroenterology

## 2021-06-06 ENCOUNTER — Telehealth: Payer: Self-pay | Admitting: Gastroenterology

## 2021-06-06 ENCOUNTER — Encounter: Payer: Medicaid Other | Admitting: Gastroenterology

## 2021-06-06 NOTE — Telephone Encounter (Signed)
Good Afternoon Dr. Louie Casa  I called this patient at 1:45 pm to see if he would be coming for his appointment he stated he was out of town and meant to call to cancel appointment but could not get anyone.  He has rescheduled for 3/20/203.

## 2021-06-06 NOTE — Telephone Encounter (Signed)
Sorry  patient has been rescheduled for 06/28/21

## 2021-06-14 ENCOUNTER — Ambulatory Visit (AMBULATORY_SURGERY_CENTER): Payer: Medicaid Other | Admitting: *Deleted

## 2021-06-14 ENCOUNTER — Other Ambulatory Visit: Payer: Self-pay

## 2021-06-14 VITALS — Ht 71.0 in | Wt 164.0 lb

## 2021-06-14 DIAGNOSIS — Z85048 Personal history of other malignant neoplasm of rectum, rectosigmoid junction, and anus: Secondary | ICD-10-CM

## 2021-06-14 MED ORDER — NA SULFATE-K SULFATE-MG SULF 17.5-3.13-1.6 GM/177ML PO SOLN: 1.0000 | Freq: Once | ORAL | 0 refills | Status: AC

## 2021-06-14 NOTE — Progress Notes (Signed)
No egg or soy allergy known to patient  No issues known to pt with past sedation with any surgeries or procedures Patient denies ever being told they had issues or difficulty with intubation  No FH of Malignant Hyperthermia Pt is not on diet pills Pt is not on  home 02  Pt is not on blood thinners  Pt denies issues with constipation  No A fib or A flutter  Pt is  vaccinated  for Covid   Due to the COVID-19 pandemic we are asking patients to follow certain guidelines in PV and the [place]   Pt aware of COVID protocols and [facility] guidelines   PV completed over the phone. Pt verified name, DOB, address and insurance during PV today.  Pt mailed instruction packet with copy of consent form to read and not return, and instructions.  Pt encouraged to call with questions or issues.  If pt has My chart, procedure instructions sent via My Chart    Suprep was contraindicated with medication generated new prep instructions and made patient aware and he verbalized understanding.  Sample sheet of over the counter items to purchase sent in mail.

## 2021-06-19 ENCOUNTER — Ambulatory Visit: Payer: Medicaid Other | Admitting: Podiatry

## 2021-06-19 ENCOUNTER — Encounter: Payer: Self-pay | Admitting: Podiatry

## 2021-06-19 ENCOUNTER — Other Ambulatory Visit: Payer: Self-pay

## 2021-06-19 ENCOUNTER — Ambulatory Visit (INDEPENDENT_AMBULATORY_CARE_PROVIDER_SITE_OTHER): Payer: Self-pay | Admitting: Internal Medicine

## 2021-06-19 ENCOUNTER — Ambulatory Visit (INDEPENDENT_AMBULATORY_CARE_PROVIDER_SITE_OTHER): Payer: Medicaid Other | Admitting: Podiatry

## 2021-06-19 VITALS — BP 124/80 | HR 62 | Temp 98.0°F | Wt 177.9 lb

## 2021-06-19 DIAGNOSIS — R519 Headache, unspecified: Secondary | ICD-10-CM

## 2021-06-19 DIAGNOSIS — B351 Tinea unguium: Secondary | ICD-10-CM

## 2021-06-19 DIAGNOSIS — H538 Other visual disturbances: Secondary | ICD-10-CM

## 2021-06-19 LAB — HEPATIC FUNCTION PANEL
AG Ratio: 2 (calc) (ref 1.0–2.5)
ALT: 30 U/L (ref 9–46)
AST: 32 U/L (ref 10–35)
Albumin: 4.4 g/dL (ref 3.6–5.1)
Alkaline phosphatase (APISO): 51 U/L (ref 35–144)
Bilirubin, Direct: 0.1 mg/dL (ref 0.0–0.2)
Globulin: 2.2 g/dL (calc) (ref 1.9–3.7)
Indirect Bilirubin: 0.9 mg/dL (calc) (ref 0.2–1.2)
Total Bilirubin: 1 mg/dL (ref 0.2–1.2)
Total Protein: 6.6 g/dL (ref 6.1–8.1)

## 2021-06-19 NOTE — Patient Instructions (Addendum)
Jeremiah [name], it was a pleasure seeing you today! ? ?Today we discussed: ?Headache- ?It sounds like you have a tension headache or a headache from sinus pressure. Please try taking Tylenol as needed for head ache. You can take up to 1000mg  every 8 hours.  ? ?Blood pressure- ?Your blood pressure looked great in clinic today. You can check blood pressure a couple times a week and if consistently > 130/80 please call clinic. ? ?Blurry vision of left eye- ?I have sent a referral to ophthalmology to have your vision rechecked. ? ?I have ordered the following labs today: ? ?Lab Orders  ?No laboratory test(s) ordered today  ?  ? ?I will call if any are abnormal. All of your labs can be accessed through "My Chart" ?  ?My Chart Access: ?[email or web address] ? ?Tests ordered today: ? ?none ? ?Referrals ordered today:  ? ?Referral Orders  ?No referral(s) requested today  ?  ? ?I have ordered the following medication/changed the following medications:  ? ?Stop the following medications: ?There are no discontinued medications.  ? ?Start the following medications: ?No orders of the defined types were placed in this encounter. ?  ? ?Follow-up: 6 months  ? ?Please make sure to arrive 15 minutes prior to your next appointment. If you arrive late, you may be asked to reschedule.  ? ?We look forward to seeing you next time. Please call our clinic at [phone] if you have any questions or concerns. The best time to call is Monday-Friday from 9am-4pm, but there is someone available 24/7. If after hours or the weekend, call the main hospital number and ask for the Internal Medicine Resident On-Call. If you need medication refills, please notify your pharmacy one week in advance and they will send [place] a request. ? ?Thank you for letting us take part in your care. Wishing you the best! ? ?Thank you, ?Dr. Howie Ill ?Jeremiah [name]  ?

## 2021-06-19 NOTE — Progress Notes (Signed)
? ? ?Subjective:  ?CC: headache, OS blurry VA ? ?HPI: ? ?[name] is a 51 y.o. male with a past medical history stated below and presents today for headache and blurry vision on left eye. Please see problem based assessment and plan for additional details. ? ?Past Medical History:  ?Diagnosis Date  ? Anal cancer ([place])   ? Anal intraepithelial neoplasia I (AIN I)   ? Anal warts   ? Anxiety   ? Appendicitis   ? Carpal tunnel syndrome   ? Right  ? COPD (chronic obstructive pulmonary disease) ([place])   ? Depression   ? Dyslipidemia 05/19/2013  ? Emphysema lung ([place])   ? Emphysema of lung ([place])   ? Hemorrhoids 02/25/2015  ? Hepatic steatosis 2021  ? History of chlamydia 05/06/2013  ? Treated 2014  ? History of tuberculin skin testing within last year 2014  ? HIV (human immunodeficiency virus infection) ([place])   ? Hypertension   ? Internal hemorrhoids   ? Latent syphilis   ? Lung nodule   ? Right upper lobe  ? Pneumonia 2007  ? x2  ? Pneumothorax 07/01/2018  ? LEFT  ? 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[place]) 01/06/2018  ? 06/23/2018-surgical pathology report- lung wedge biopsy necrotizing granulomatous inflammation associated fibrosis, no evidence of malignancy  ? Rectal cancer ([place])   ? Right sided abdominal pain 01/09/2020  ? S/P laparoscopic appendectomy 01/10/2020  ? Tinea pedis   ? ? ?Current Outpatient Medications on File Prior to Visit  ?Medication Sig Dispense Refill  ? albuterol (VENTOLIN HFA) 108 (90 Base) MCG/ACT inhaler Inhale 1-2 puffs into the lungs every 6 (six) hours as needed for wheezing or shortness of breath. 6.7 g 12  ? bictegravir-emtricitabine-tenofovir AF (BIKTARVY) 50-200-25 MG TABS tablet Take 1 tablet by mouth daily. 30 tablet 11  ? mometasone-formoterol (DULERA) 200-5 MCG/ACT AERO Inhale 2 puffs into the lungs in the morning and at bedtime. 13 g 5  ? ?No current facility-administered medications on file prior to visit.  ? ? ?Family History  ?Problem Relation Age of Onset  ? Diabetes Mother   ?  Hypertension Mother   ? Cancer Mother   ?     breast  ? Diabetes Father   ? Hypertension Father   ? Colon cancer Neg Hx   ? Colon polyps Neg Hx   ? Esophageal cancer Neg Hx   ? Stomach cancer Neg Hx   ? Rectal cancer Neg Hx   ? ? ?Social History  ? ?Socioeconomic History  ? Marital status: Single  ?  Spouse name: Not on file  ? Number of children: Not on file  ? Years of education: Not on file  ? Highest education level: Not on file  ?Occupational History  ? Not on file  ?Tobacco Use  ? Smoking status: Every Day  ?  Packs/day: 0.10  ?  Years: 27.00  ?  Pack years: 2.70  ?  Types: Cigars, Cigarettes  ? Smokeless tobacco: Never  ? Tobacco comments:  ?  6 cigars a day- 12/31/2020   ?Vaping Use  ? Vaping Use: Never used  ?Substance and Sexual Activity  ? Alcohol use: Not Currently  ?  Comment: 1.5 pt liquior day  ? Drug use: Not Currently  ?  Comment: Past history of crack cocaine  use   ? Sexual activity: Not Currently  ?  Partners: Male  ?  Birth control/protection: Condom  ?  Comment: declined condoms 03/2019  ?Other Topics Concern  ?  Not on file  ?Social History Narrative  ? Not on file  ? ?Social Determinants of Health  ? ?Financial Resource Strain: Not on file  ?Food Insecurity: Not on file  ?Transportation Needs: Not on file  ?Physical Activity: Not on file  ?Stress: Not on file  ?Social Connections: Not on file  ?Intimate Partner Violence: Not on file  ? ? ?Review of Systems: ?ROS negative except for what is noted on the assessment and plan. ? ?Objective:  ? ?Vitals:  ? 06/19/21 1401  ?BP: 124/80  ?Pulse: 62  ?Temp: 98 ?F (36.7 ?C)  ?TempSrc: Oral  ?SpO2: 99%  ?Weight: 177 lb 14.4 oz (80.7 kg)  ? ? ?Physical Exam: ?Gen: A&O x3 and in no apparent distress, well appearing and nourished. ?HEENT:  ?  Eye - visual acuity grossly intact, peripheral vision intact, conjunctiva clear, sclera non-icteric, EOM intact, no pain with movement of eye.  ?Neck: no masses or nodules, AROM intact. ?CV: RRR, no murmurs, S1/S2  presents  ?Resp: Clear to ascultation bilaterally  ?Abd: BS (+) x4, soft, non-tender abdomen, without hepatosplenomegaly or masses ?MSK: Grossly normal AROM and strength x4 extremities. ?Skin: good skin turgor, no rashes, unusual bruising, or prominent lesions.  ?Neuro: No focal deficits, grossly normal sensation and coordination.  ?Psych: Oriented x3 and responding appropriately. Intact memory, normal mood, judgement, affect, and insight.  ? ? ?Assessment & Plan:  ?See Encounters Tab for problem based charting. ? ?Patient discussed with Dr. Dareen Piano ? ? ?Christiana Fuchs, D.O. ?[facility]  PGY-1 ?Pager: [phone]  Phone: [phone] ?Date 06/20/2021  Time 6:52 AM  ? ? ?

## 2021-06-19 NOTE — Progress Notes (Signed)
?  Subjective:  ?Patient ID: Jeremiah [name], male    DOB: 05-Nov-1970,   MRN: [id number] ? ?Chief Complaint  ?Patient presents with  ? Nail Problem  ?   ?Bilateral nail fungus , patient states nail fungus has not improved since his last visit   ? ? ?51 y.o. male presents as a work in for follow-up of fungal toenails. Has no showed several times in the past and was supposed to be seen in [place] today. Patient was supposed to have started Lamisil but relates he never started it and never had lab work done. Also relates he has bilateral bunions that are painful.  Denies any other pedal complaints. Denies n/v/f/c.  ? ?Past Medical History:  ?Diagnosis Date  ? Anal cancer ([place])   ? Anal intraepithelial neoplasia I (AIN I)   ? Anal warts   ? Anxiety   ? Appendicitis   ? Carpal tunnel syndrome   ? Right  ? COPD (chronic obstructive pulmonary disease) ([place])   ? Depression   ? Dyslipidemia 05/19/2013  ? Emphysema lung ([place])   ? Emphysema of lung ([place])   ? Hemorrhoids 02/25/2015  ? Hepatic steatosis 2021  ? History of chlamydia 05/06/2013  ? Treated 2014  ? History of tuberculin skin testing within last year 2014  ? HIV (human immunodeficiency virus infection) ([place])   ? Hypertension   ? Internal hemorrhoids   ? Latent syphilis   ? Lung nodule   ? Right upper lobe  ? Pneumonia 2007  ? x2  ? Pneumothorax 07/01/2018  ? LEFT  ? 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[place]) 01/06/2018  ? 06/23/2018-surgical pathology report- lung wedge biopsy necrotizing granulomatous inflammation associated fibrosis, no evidence of malignancy  ? Rectal cancer ([place])   ? Right sided abdominal pain 01/09/2020  ? S/P laparoscopic appendectomy 01/10/2020  ? Tinea pedis   ? ? ?Objective:  ?Physical Exam: ?Vascular: DP/PT pulses 2/4 bilateral. CFT <3 seconds. Normal hair growth on digits. No edema.  ?Skin. No lacerations or abrasions bilateral feet. Nails 1-5 bilateral are thickened discolored and with subungual debris.  ?Musculoskeletal: MMT 5/5 bilateral lower  extremities in DF, PF, Inversion and Eversion. Deceased ROM in DF of ankle joint. HAV deformity noted bilateral.  ?Neurological: Sensation intact to light touch.  ? ?Assessment:  ? ?1. Onychomycosis   ? ? ? ?Plan:  ?Patient was evaluated and treated and all questions answered. ?-Examined patient ?-Discussed treatment options for painful dystrophic nail.   ?-Discussed fungal nail treatment options including oral, topical, and laser treatments.  ?-Patient would like to try oral lamisil. Will send to lab for LFTs and send in prescription one those come back normal.  ?-Patient will reschedule another time to be seen for his bunions.  ?-Patient to return in 3 months for re-evaluation.  ? ? ?Lorenda Peck, DPM  ? ? ?

## 2021-06-20 ENCOUNTER — Other Ambulatory Visit: Payer: Self-pay | Admitting: Podiatry

## 2021-06-20 DIAGNOSIS — H538 Other visual disturbances: Secondary | ICD-10-CM | POA: Insufficient documentation

## 2021-06-20 MED ORDER — TERBINAFINE HCL 250 MG PO TABS: 250.0000 mg | ORAL_TABLET | Freq: Every day | ORAL | 0 refills | Status: AC

## 2021-06-20 NOTE — Assessment & Plan Note (Signed)
Patient endorses headache that has been present today.  He states that this happens about 2 times a week for the last 2 weeks.  He endorses nasal congestion and cough for the last 5 days.  He has not been around anyone else who is sick and feels that his cold is overall improving.  Pain is present in front part of head bilaterally, but worse on the left side.  Endorses pain with pressure of maxillary sinus.  He tried a Copy powder this morning and it did not seem to help. ?Differentials include tension headache or secondary to sinusitis ?P: ?Advised patient to avoid taking Goody powder.  He was given samples of Tylenol 500 mg as he stated that he had difficulty affording medications right now. ?

## 2021-06-20 NOTE — Assessment & Plan Note (Signed)
Patient states that he has blurry vision of his left eye.  He initially noticed this when he had a headache this morning.  He endorses pain of area behind right eye.  He has previously seen an ophthalmologist over a year ago and was told he did not need glasses at that time.  He has noticed increasing difficulty with reading on his cell phone and text close. ?On exam conjunctiva is not erythematous, pupils are equal and reactive to light no photophobia present, no pain with extraocular movements, peripheral vision intact, visual acuity intact with patient being able to read 20/20 ?on chart. ?Differentials include uveitis, sinusitis, and presbyopia. ?Uveitis is unlikely with conjunctival nonerythematous and description of pain.  Patient has had a cold for the last 6 days and endorses maxillary sinus pressure.  Pain the patient is describing could be secondary to sinusitis.  Presbyopia could also be causing blurry vision.  Patient's visual acuity was 20/20 when tested but he is describing blurry vision with reading phone. ?P: ?Referral to opthalmologist ?

## 2021-06-20 NOTE — Progress Notes (Signed)
Internal Medicine Clinic Attending  Case discussed with Dr. Masters  At the time of the visit.  We reviewed the resident's history and exam and pertinent patient test results.  I agree with the assessment, diagnosis, and plan of care documented in the resident's note.  

## 2021-06-26 ENCOUNTER — Telehealth: Payer: Self-pay | Admitting: Gastroenterology

## 2021-06-26 ENCOUNTER — Other Ambulatory Visit: Payer: Self-pay | Admitting: Urology

## 2021-06-26 NOTE — Telephone Encounter (Signed)
Pt states dentist gave Amoxicillin and the foot doctor gave the erbinafine- ? Can he take and still have his colon- pt instrcted yes- no issues with either, take as directed  ?

## 2021-06-26 NOTE — Telephone Encounter (Signed)
Inbound call from patient. Went to the dentist and have questions if it is okay to take amoxicillin and erbinafine '25mg'$  before his prep for upcoming procedure 3/10 ?

## 2021-06-28 ENCOUNTER — Encounter: Payer: Self-pay | Admitting: Gastroenterology

## 2021-06-28 ENCOUNTER — Ambulatory Visit (AMBULATORY_SURGERY_CENTER): Payer: Medicaid Other | Admitting: Gastroenterology

## 2021-06-28 VITALS — BP 111/64 | HR 51 | Temp 97.7°F | Resp 14 | Ht 71.0 in | Wt 164.0 lb

## 2021-06-28 DIAGNOSIS — K921 Melena: Secondary | ICD-10-CM | POA: Diagnosis not present

## 2021-06-28 DIAGNOSIS — Z85048 Personal history of other malignant neoplasm of rectum, rectosigmoid junction, and anus: Secondary | ICD-10-CM

## 2021-06-28 DIAGNOSIS — K640 First degree hemorrhoids: Secondary | ICD-10-CM

## 2021-06-28 HISTORY — PX: COLONOSCOPY WITH PROPOFOL: SHX5780

## 2021-06-28 MED ORDER — SODIUM CHLORIDE 0.9 % IV SOLN: 500.0000 mL | INTRAVENOUS | Status: DC

## 2021-06-28 NOTE — Progress Notes (Signed)
[facility] History and Physical ? ? ?Primary Care Physician:  Lacinda Axon, MD ? ? ?Reason for Procedure:   Hematochezia, history of anal cancer ? ?Plan:    Colonoscopy ? ? ? ? ?HPI: Jeremiah [name] is a 51 y.o. male with a history of anal cancer in 2019 s/p radiation, with recent painless hematochezia.  No known history of previous colonoscopy.  No family history of colon cancer. ? ? ?Past Medical History:  ?Diagnosis Date  ? Anal cancer ([place])   ? Anal intraepithelial neoplasia I (AIN I)   ? Anal warts   ? Anxiety   ? Appendicitis   ? Carpal tunnel syndrome   ? Right  ? COPD (chronic obstructive pulmonary disease) ([place])   ? Depression   ? Dyslipidemia 05/19/2013  ? Emphysema lung ([place])   ? Emphysema of lung ([place])   ? Hemorrhoids 02/25/2015  ? Hepatic steatosis 2021  ? History of chlamydia 05/06/2013  ? Treated 2014  ? History of tuberculin skin testing within last year 2014  ? HIV (human immunodeficiency virus infection) ([place])   ? Hypertension   ? Internal hemorrhoids   ? Latent syphilis   ? Lung nodule   ? Right upper lobe  ? Pneumonia 2007  ? x2  ? Pneumothorax 07/01/2018  ? LEFT  ? 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[place]) 01/06/2018  ? 06/23/2018-surgical pathology report- lung wedge biopsy necrotizing granulomatous inflammation associated fibrosis, no evidence of malignancy  ? Rectal cancer ([place])   ? Right sided abdominal pain 01/09/2020  ? S/P laparoscopic appendectomy 01/10/2020  ? Tinea pedis   ? ? ?Past Surgical History:  ?Procedure Laterality Date  ? CHEST TUBE INSERTION ([facility] HX) Left 06/30/2018  ? CONDYLOMA EXCISION/FULGURATION    ? HERNIA REPAIR  1991  ? Abdominal   ? IR RADIOLOGIST EVAL & MGMT  05/17/2019  ? IR RADIOLOGY PERIPHERAL GUIDED IV START  05/20/2019  ? IR TRANSCATH RETRIEVAL FB INCL GUIDANCE (MS)  05/20/2019  ? IR US GUIDE VASC ACCESS RIGHT  05/20/2019  ? LAPAROSCOPIC APPENDECTOMY N/A 01/11/2020  ? Procedure: APPENDECTOMY LAPAROSCOPIC;  Surgeon: Coralie Keens, MD;  Location: [facility];   Service: General;  Laterality: N/A;  ? PORT-A-CATH REMOVAL N/A 05/11/2019  ? Procedure: PARTIAL PORT REMOVAL;  Surgeon: Leighton Ruff, MD;  Location: [facility];  Service: General;  Laterality: N/A;  ? unilateral orchiectomy    ? VIDEO ASSISTED THORACOSCOPY (VATS)/WEDGE RESECTION Right 06/23/2018  ? Procedure: VIDEO ASSISTED THORACOSCOPY (VATS)/LUNG RESECTION, stapling and dissection of apical bleb, wedge resection of right upper lobe mass, lymph node dissection, intercostal nerve block;  Surgeon: Grace Isaac, MD;  Location: [facility];  Service: Thoracic;  Laterality: Right;  ? VIDEO BRONCHOSCOPY N/A 06/23/2018  ? Procedure: VIDEO BRONCHOSCOPY;  Surgeon: Grace Isaac, MD;  Location: [facility];  Service: Thoracic;  Laterality: N/A;  ? ? ?Prior to Admission medications   ?Medication Sig Start Date End Date Taking? Authorizing Provider  ?amoxicillin (AMOXIL) 500 MG tablet Take 500 mg by mouth every 8 (eight) hours. 06/26/21  Yes [provider]  ?bictegravir-emtricitabine-tenofovir AF (BIKTARVY) 50-200-25 MG TABS tablet Take 1 tablet by mouth daily. 02/06/21  Yes Michel Bickers, MD  ?mometasone-formoterol [facility]) 200-5 MCG/ACT AERO Inhale 2 puffs into the lungs in the morning and at bedtime. 05/20/21  Yes Mannam, Praveen, MD  ?albuterol (VENTOLIN HFA) 108 (90 Base) MCG/ACT inhaler Inhale 1-2 puffs into the lungs every 6 (six) hours as needed for wheezing or shortness of breath. 05/21/20  Lacinda Axon, MD  ?terbinafine (LAMISIL) 250 MG tablet Take 1 tablet (250 mg total) by mouth daily. ?Patient not taking: Reported on 06/28/2021 06/20/21 09/18/21  Lorenda Peck, MD  ? ? ?Current Outpatient Medications  ?Medication Sig Dispense Refill  ? amoxicillin (AMOXIL) 500 MG tablet Take 500 mg by mouth every 8 (eight) hours.    ? bictegravir-emtricitabine-tenofovir AF (BIKTARVY) 50-200-25 MG TABS tablet Take 1 tablet by mouth daily. 30 tablet 11  ? mometasone-formoterol (DULERA) 200-5 MCG/ACT AERO  Inhale 2 puffs into the lungs in the morning and at bedtime. 13 g 5  ? albuterol (VENTOLIN HFA) 108 (90 Base) MCG/ACT inhaler Inhale 1-2 puffs into the lungs every 6 (six) hours as needed for wheezing or shortness of breath. 6.7 g 12  ? terbinafine (LAMISIL) 250 MG tablet Take 1 tablet (250 mg total) by mouth daily. (Patient not taking: Reported on 06/28/2021) 90 tablet 0  ? ?Current Facility-Administered Medications  ?Medication Dose Route Frequency Provider Last Rate Last Admin  ? 0.9 %  sodium chloride infusion  500 mL Intravenous Continuous Daryel November, MD      ? ? ?Allergies as of 06/28/2021 - Review Complete 06/28/2021  ?Allergen Reaction Noted  ? Nsaids Other (See Comments) 02/25/2015  ? Sulfa antibiotics Itching 02/25/2015  ? ? ?Family History  ?Problem Relation Age of Onset  ? Diabetes Mother   ? Hypertension Mother   ? Cancer Mother   ?     breast  ? Diabetes Father   ? Hypertension Father   ? Colon cancer Neg Hx   ? Colon polyps Neg Hx   ? Esophageal cancer Neg Hx   ? Stomach cancer Neg Hx   ? Rectal cancer Neg Hx   ? ? ?Social History  ? ?Socioeconomic History  ? Marital status: Single  ?  Spouse name: Not on file  ? Number of children: Not on file  ? Years of education: Not on file  ? Highest education level: Not on file  ?Occupational History  ? Not on file  ?Tobacco Use  ? Smoking status: Every Day  ?  Packs/day: 0.10  ?  Years: 27.00  ?  Pack years: 2.70  ?  Types: Cigars, Cigarettes  ? Smokeless tobacco: Never  ? Tobacco comments:  ?  6 cigars a day- 12/31/2020   ?Vaping Use  ? Vaping Use: Never used  ?Substance and Sexual Activity  ? Alcohol use: Not Currently  ?  Comment: 1.5 pt liquior day  ? Drug use: Not Currently  ?  Comment: Past history of crack cocaine  use   ? Sexual activity: Not Currently  ?  Partners: Male  ?  Birth control/protection: Condom  ?  Comment: declined condoms 03/2019  ?Other Topics Concern  ? Not on file  ?Social History Narrative  ? Not on file  ? ?Social  Determinants of Health  ? ?Financial Resource Strain: Not on file  ?Food Insecurity: Not on file  ?Transportation Needs: Not on file  ?Physical Activity: Not on file  ?Stress: Not on file  ?Social Connections: Not on file  ?Intimate Partner Violence: Not on file  ? ? ?Review of Systems: ? ?All other review of systems negative except as mentioned in the HPI. ? ?Physical Exam: ?Vital signs ?BP 120/62   Pulse (!) 59   Temp 97.7 ?F (36.5 ?C) (Temporal)   Ht '5\' 11"'$  (1.803 m)   Wt 164 lb (74.4 kg)   SpO2 97%  BMI 22.87 kg/m?  ? ?General:   Alert,  Well-developed, well-nourished, pleasant and cooperative in NAD ?Airway:  Mallampati 1 ?Lungs:  Clear throughout to auscultation.   ?Heart:  Regular rate and rhythm; no murmurs, clicks, rubs,  or gallops. ?Abdomen:  Soft, nontender and nondistended. Normal bowel sounds.   ?Neuro/Psych:  Normal mood and affect. A and O x 3 ? ? ?Rahmir Beever E. Candis Schatz, MD ?[facility] ? ?

## 2021-06-28 NOTE — Op Note (Signed)
[facility] ?Patient Name: Jeremiah [name] ?Procedure Date: 06/28/2021 3:09 PM ?MRN: [id number] ?Endoscopist: Lakyn Alsteen E. Candis Schatz , MD ?Age: 51 ?Referring MD:  ?Date of Birth: 24-Jul-1970 ?Gender: Male ?Account #: [id number] ?Procedure:                Colonoscopy ?Indications:              Hematochezia ?Medicines:                Monitored Anesthesia Care ?Procedure:                Pre-Anesthesia Assessment: ?                          - Prior to the procedure, a History and Physical  ?                          was performed, and patient medications and  ?                          allergies were reviewed. The patient's tolerance of  ?                          previous anesthesia was also reviewed. The risks  ?                          and benefits of the procedure and the sedation  ?                          options and risks were discussed with the patient.  ?                          All questions were answered, and informed consent  ?                          was obtained. Prior Anticoagulants: The patient has  ?                          taken no previous anticoagulant or antiplatelet  ?                          agents. ASA Grade Assessment: II - A patient with  ?                          mild systemic disease. After reviewing the risks  ?                          and benefits, the patient was deemed in  ?                          satisfactory condition to undergo the procedure. ?                          After obtaining informed consent, the colonoscope  ?  was passed under direct vision. Throughout the  ?                          procedure, the patient's blood pressure, pulse, and  ?                          oxygen saturations were monitored continuously. The  ?                          Olympus CF-[id number] (#[id number]) Colonoscope was  ?                          introduced through the anus and advanced to the the  ?                          terminal ileum, with identification of the   ?                          appendiceal orifice and IC valve. The colonoscopy  ?                          was performed without difficulty. The patient  ?                          tolerated the procedure well. The quality of the  ?                          bowel preparation was adequate. The terminal ileum,  ?                          ileocecal valve, appendiceal orifice, and rectum  ?                          were photographed. The bowel preparation used was  ?                          SUPREP via split dose instruction. ?Scope In: 3:23:24 PM ?Scope Out: 3:45:16 PM ?Scope Withdrawal Time: 0 hours 14 minutes 37 seconds  ?Total Procedure Duration: 0 hours 21 minutes 52 seconds  ?Findings:                 The perianal exam findings include deformed  ?                          sphincter complex with prolapsed mucosa. ?                          The digital rectal exam findings include decreased  ?                          sphincter tone. Pertinent negatives include no  ?                          palpable rectal lesions. ?  The colon (entire examined portion) appeared normal. ?                          The terminal ileum appeared normal. ?                          Non-bleeding internal hemorrhoids were found during  ?                          retroflexion. The hemorrhoids were Grade I  ?                          (internal hemorrhoids that do not prolapse). ?                          Changes in the dentate line were seen consistent  ?                          with prior anal cancer treatment. No additional  ?                          abnormalities were found on retroflexion. ?Complications:            No immediate complications. ?Estimated Blood Loss:     Estimated blood loss: none. ?Impression:               - Deformed sphincter complex with prolapsed mucosa  ?                          found on perianal exam. ?                          - Decreased sphincter tone found on digital rectal  ?                           exam. ?                          - The entire examined colon is normal. ?                          - The examined portion of the ileum was normal. ?                          - Non-bleeding internal hemorrhoids. ?                          - Altered dentate line without evidence of  ?                          recurrent cancer. No specimens collected. ?Recommendation:           - Patient has a contact number available for  ?                          emergencies. The signs and symptoms of potential  ?  delayed complications were discussed with the  ?                          patient. Return to normal activities tomorrow.  ?                          Written discharge instructions were provided to the  ?                          patient. ?                          - Resume previous diet. ?                          - Continue present medications. ?                          - Repeat colonoscopy in 10 years for screening  ?                          purposes. ?                          - Follow up with oncology as needed for anal cancer  ?                          surveillance. ?Monserrath Junio E. Candis Schatz, MD ?06/28/2021 3:54:39 PM ?This report has been signed electronically. ?

## 2021-06-28 NOTE — Patient Instructions (Signed)
Discharge instructions given. °Handout on Hemorrhoids. °Resume previous medications. °YOU HAD AN ENDOSCOPIC PROCEDURE TODAY AT THE [facility]:   Refer to the procedure report that was given to you for any specific questions about what was found during the examination.  If the procedure report does not answer your questions, please call your gastroenterologist to clarify.  If you requested that your care partner not be given the details of your procedure findings, then the procedure report has been included in a sealed envelope for you to review at your convenience later. ° °YOU SHOULD EXPECT: Some feelings of bloating in the abdomen. Passage of more gas than usual.  Walking can help get rid of the air that was put into your GI tract during the procedure and reduce the bloating. If you had a lower endoscopy (such as a colonoscopy or flexible sigmoidoscopy) you may notice spotting of blood in your stool or on the toilet paper. If you underwent a bowel prep for your procedure, you may not have a normal bowel movement for a few days. ° °Please Note:  You might notice some irritation and congestion in your nose or some drainage.  This is from the oxygen used during your procedure.  There is no need for concern and it should clear up in a day or so. ° °SYMPTOMS TO REPORT IMMEDIATELY: ° °Following lower endoscopy (colonoscopy or flexible sigmoidoscopy): ° Excessive amounts of blood in the stool ° Significant tenderness or worsening of abdominal pains ° Swelling of the abdomen that is new, acute ° Fever of 100°F or higher ° ° °For urgent or emergent issues, a gastroenterologist can be reached at any hour by calling [phone]. °Do not use MyChart messaging for urgent concerns.  ° ° °DIET:  We do recommend a small meal at first, but then you may proceed to your regular diet.  Drink plenty of fluids but you should avoid alcoholic beverages for 24 hours. ° °ACTIVITY:  You should plan to take it easy for the  rest of today and you should NOT DRIVE or use heavy machinery until tomorrow (because of the sedation medicines used during the test).   ° °FOLLOW UP: °Our staff will call the number listed on your records 48-72 hours following your procedure to check on you and address any questions or concerns that you may have regarding the information given to you following your procedure. If we do not reach you, we will leave a message.  We will attempt to reach you two times.  During this call, we will ask if you have developed any symptoms of COVID 19. If you develop any symptoms (ie: fever, flu-like symptoms, shortness of breath, cough etc.) before then, please call [phone].  If you test positive for Covid 19 in the 2 weeks post procedure, please call and report this information to us.   ° °If any biopsies were taken you will be contacted by phone or by letter within the next 1-3 weeks.  Please call us at [phone] if you have not heard about the biopsies in 3 weeks.  ° ° °SIGNATURES/CONFIDENTIALITY: °You and/or your care partner have signed paperwork which will be entered into your electronic medical record.  These signatures attest to the fact that that the information above on your After Visit Summary has been reviewed and is understood.  Full responsibility of the confidentiality of this discharge information lies with you and/or your care-partner.  °

## 2021-06-28 NOTE — Progress Notes (Signed)
Sedate, gd SR, tolerated procedure well, VSS, report to RN 

## 2021-06-28 NOTE — Progress Notes (Signed)
Pt's states no medical or surgical changes since previsit or office visit. 

## 2021-07-02 ENCOUNTER — Telehealth: Payer: Self-pay | Admitting: *Deleted

## 2021-07-02 NOTE — Telephone Encounter (Signed)
?  Follow up Call- ? ?Call back number 06/28/2021  ?Post procedure Call Back phone  # [phone]  ?Permission to leave phone message Yes  ?Some recent data might be hidden  ?  ? ?Patient questions: ? ?Do you have a fever, pain , or abdominal swelling? No. ?Pain Score  0 * ? ?Have you tolerated food without any problems? Yes.   ? ?Have you been able to return to your normal activities? Yes.   ? ?Do you have any questions about your discharge instructions: ?Diet   No. ?Medications  No. ?Follow up visit  No. ? ?Do you have questions or concerns about your Care? No. ? ?Actions: ?* If pain score is 4 or above: ?No action needed, pain <4. ? ?Have you developed a fever since your procedure? no ? ?2.   Have you had an respiratory symptoms (SOB or cough) since your procedure? no ? ?3.   Have you tested positive for COVID 19 since your procedure no ? ?4.   Have you had any family members/close contacts diagnosed with the COVID 19 since your procedure?  no ? ? ?If yes to any of these questions please route to Joylene John, RN and Joella Prince, RN ? ? ? ?

## 2021-07-08 ENCOUNTER — Other Ambulatory Visit: Payer: Self-pay | Admitting: Podiatry

## 2021-07-08 ENCOUNTER — Telehealth: Payer: Self-pay | Admitting: *Deleted

## 2021-07-08 MED ORDER — CICLOPIROX 8 % EX SOLN: Freq: Every day | CUTANEOUS | 0 refills | Status: AC

## 2021-07-08 NOTE — Telephone Encounter (Signed)
Spoke with patient and he said to send topical to [facility] [address], [place].

## 2021-07-08 NOTE — Telephone Encounter (Signed)
Patient is calling because he stopped taking the terbinafine, causing muscle aches, is there something else that can be prescribed? ?

## 2021-07-09 ENCOUNTER — Other Ambulatory Visit: Payer: Self-pay | Admitting: Podiatry

## 2021-07-09 ENCOUNTER — Ambulatory Visit: Payer: Medicaid Other | Admitting: Pulmonary Disease

## 2021-07-10 NOTE — Telephone Encounter (Signed)
Patient notified that medication has been sent 

## 2021-07-16 ENCOUNTER — Encounter (HOSPITAL_BASED_OUTPATIENT_CLINIC_OR_DEPARTMENT_OTHER): Payer: Self-pay | Admitting: Urology

## 2021-07-17 ENCOUNTER — Encounter (HOSPITAL_BASED_OUTPATIENT_CLINIC_OR_DEPARTMENT_OTHER): Payer: Self-pay | Admitting: Urology

## 2021-07-17 ENCOUNTER — Other Ambulatory Visit: Payer: Self-pay

## 2021-07-17 NOTE — Progress Notes (Signed)
Spoke w/ via phone for pre-op interview--- pt ?Lab needs dos---- no              ?Lab results------ no ?COVID test -----patient states asymptomatic no test needed ?Arrive at ------- 1130 on 07-19-2021 ?NPO after MN NO Solid Food.  Clear liquids from MN until--- 1030 ?Med rec completed ?Medications to take morning of surgery ----- biktarby, amoxicillin, dulera inhaler ?Diabetic medication ----- ?Patient instructed no nail polish to be worn day of surgery ?Patient instructed to bring photo id and insurance card day of surgery ?Patient aware to have Driver (ride ) / caregiver for 24 hours after surgery ---- pt did not know whom yet ?Pt verbalized understanding to have name / phone number of driver (age 70 older) and caregiver dos ?Patient Special Instructions ----- asked to bring rescue inhaler dos ?Pre-Op special Istructions ----- n/a ?Patient verbalized understanding of instructions that were given at this phone interview. ?Patient denies shortness of breath, chest pain, fever, cough at this phone interview.  ?

## 2021-07-19 ENCOUNTER — Ambulatory Visit (HOSPITAL_BASED_OUTPATIENT_CLINIC_OR_DEPARTMENT_OTHER): Admission: RE | Admit: 2021-07-19 | Payer: Medicaid Other | Source: Home / Self Care | Admitting: Urology

## 2021-07-19 HISTORY — DX: Emphysema, unspecified: J43.9

## 2021-07-19 HISTORY — DX: Anogenital (venereal) warts: A63.0

## 2021-07-19 HISTORY — DX: Partial loss of teeth, unspecified cause, unspecified class: K08.409

## 2021-07-19 HISTORY — DX: Phimosis: N47.1

## 2021-07-19 SURGERY — CIRCUMCISION, ADULT
Anesthesia: General

## 2021-07-24 ENCOUNTER — Other Ambulatory Visit: Payer: Medicaid Other

## 2021-07-29 ENCOUNTER — Other Ambulatory Visit: Payer: Self-pay

## 2021-07-29 ENCOUNTER — Other Ambulatory Visit: Payer: Medicaid Other

## 2021-07-29 DIAGNOSIS — Z113 Encounter for screening for infections with a predominantly sexual mode of transmission: Secondary | ICD-10-CM

## 2021-07-29 DIAGNOSIS — B20 Human immunodeficiency virus [HIV] disease: Secondary | ICD-10-CM

## 2021-07-30 ENCOUNTER — Other Ambulatory Visit: Payer: Medicaid Other

## 2021-07-30 LAB — T-HELPER CELL (CD4) - (RCID CLINIC ONLY)
CD4 % Helper T Cell: 18 % — ABNORMAL LOW (ref 33–65)
CD4 T Cell Abs: 235 /uL — ABNORMAL LOW (ref 400–1790)

## 2021-07-31 ENCOUNTER — Telehealth: Payer: Self-pay | Admitting: *Deleted

## 2021-07-31 LAB — CBC
HCT: 41.7 % (ref 38.5–50.0)
Hemoglobin: 14.6 g/dL (ref 13.2–17.1)
MCH: 36.2 pg — ABNORMAL HIGH (ref 27.0–33.0)
MCHC: 35 g/dL (ref 32.0–36.0)
MCV: 103.5 fL — ABNORMAL HIGH (ref 80.0–100.0)
MPV: 10.3 fL (ref 7.5–12.5)
Platelets: 141 10*3/uL (ref 140–400)
RBC: 4.03 10*6/uL — ABNORMAL LOW (ref 4.20–5.80)
RDW: 12.8 % (ref 11.0–15.0)
WBC: 3.8 10*3/uL (ref 3.8–10.8)

## 2021-07-31 LAB — RPR TITER: RPR Titer: 1:2 {titer} — ABNORMAL HIGH

## 2021-07-31 LAB — LIPID PANEL
Cholesterol: 172 mg/dL (ref <200)
HDL: 43 mg/dL (ref >=40)
LDL Cholesterol (Calc): 99 mg/dL (calc)
Non-HDL Cholesterol (Calc): 129 mg/dL (calc) (ref <130)
Total CHOL/HDL Ratio: 4 (calc) (ref <5.0)
Triglycerides: 201 mg/dL — ABNORMAL HIGH (ref <150)

## 2021-07-31 LAB — COMPREHENSIVE METABOLIC PANEL
AG Ratio: 1.9 (calc) (ref 1.0–2.5)
ALT: 18 U/L (ref 9–46)
AST: 25 U/L (ref 10–35)
Albumin: 4.2 g/dL (ref 3.6–5.1)
Alkaline phosphatase (APISO): 41 U/L (ref 35–144)
BUN: 8 mg/dL (ref 7–25)
CO2: 29 mmol/L (ref 20–32)
Calcium: 8.9 mg/dL (ref 8.6–10.3)
Chloride: 109 mmol/L (ref 98–110)
Creat: 1 mg/dL (ref 0.70–1.30)
Globulin: 2.2 g/dL (calc) (ref 1.9–3.7)
Glucose, Bld: 93 mg/dL (ref 65–99)
Potassium: 4.4 mmol/L (ref 3.5–5.3)
Sodium: 142 mmol/L (ref 135–146)
Total Bilirubin: 0.9 mg/dL (ref 0.2–1.2)
Total Protein: 6.4 g/dL (ref 6.1–8.1)

## 2021-07-31 LAB — RPR: RPR Ser Ql: REACTIVE — AB

## 2021-07-31 LAB — FLUORESCENT TREPONEMAL AB(FTA)-IGG-BLD: Fluorescent Treponemal ABS: REACTIVE — AB

## 2021-07-31 LAB — HIV-1 RNA QUANT-NO REFLEX-BLD
HIV 1 RNA Quant: NOT DETECTED copies/mL
HIV-1 RNA Quant, Log: NOT DETECTED Log copies/mL

## 2021-07-31 NOTE — Telephone Encounter (Addendum)
Patient called in thinking he needs to go back on BP med. States his BP at [facility] is 137/88. Also, c/o h/a unrelieved with Tylenol. Denies CP, SHOB, vision changes. Offered tele or in-person appt today. Declines both as he is out of town working. Advised he can go to a local UC today or schedule in person appt next week . He requests in person appt next week. Scheduled for 4/17 at 0845 with Red Team Provider. ?

## 2021-07-31 NOTE — Telephone Encounter (Signed)
I agree, thank you.

## 2021-08-05 ENCOUNTER — Encounter: Payer: Medicaid Other | Admitting: Internal Medicine

## 2021-08-05 ENCOUNTER — Encounter: Payer: Self-pay | Admitting: Student

## 2021-08-07 ENCOUNTER — Encounter: Payer: Medicaid Other | Admitting: Internal Medicine

## 2021-08-08 ENCOUNTER — Ambulatory Visit: Payer: Medicaid Other | Admitting: Pulmonary Disease

## 2021-08-13 ENCOUNTER — Other Ambulatory Visit: Payer: Self-pay | Admitting: Urology

## 2021-08-15 ENCOUNTER — Encounter: Payer: Medicaid Other | Admitting: Internal Medicine

## 2021-08-27 ENCOUNTER — Ambulatory Visit (INDEPENDENT_AMBULATORY_CARE_PROVIDER_SITE_OTHER): Payer: Medicaid Other | Admitting: Internal Medicine

## 2021-08-27 ENCOUNTER — Encounter: Payer: Self-pay | Admitting: Internal Medicine

## 2021-08-27 ENCOUNTER — Other Ambulatory Visit: Payer: Self-pay

## 2021-08-27 DIAGNOSIS — F419 Anxiety disorder, unspecified: Secondary | ICD-10-CM

## 2021-08-27 DIAGNOSIS — B20 Human immunodeficiency virus [HIV] disease: Secondary | ICD-10-CM | POA: Diagnosis not present

## 2021-08-27 DIAGNOSIS — F17210 Nicotine dependence, cigarettes, uncomplicated: Secondary | ICD-10-CM | POA: Diagnosis not present

## 2021-08-27 MED ORDER — BIKTARVY 50-200-25 MG PO TABS: 1.0000 | ORAL_TABLET | Freq: Every day | ORAL | 11 refills | Status: DC

## 2021-08-27 NOTE — Assessment & Plan Note (Signed)
His infection remains under excellent, long-term control.  He will continue Biktarvy and follow-up after lab work in 1 year. 

## 2021-08-27 NOTE — Progress Notes (Signed)
? ?   ? ? ? ? ?Patient Active Problem List  ? Diagnosis Date Noted  ? 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[place]) 01/06/2018  ?  Priority: High  ? Anal cancer ([place]) 11/07/2015  ?  Priority: Medium   ? Human immunodeficiency virus (HIV) disease ([place]) 05/19/2013  ?  Priority: High  ? Blurry vision, left eye 06/20/2021  ? Balanitis 05/20/2021  ? Left shoulder pain 12/31/2020  ? Numbness and tingling in right hand 12/31/2020  ? Allergic rhinitis 10/24/2020  ? Insomnia 10/24/2020  ? Healthcare maintenance 10/24/2020  ? Tick bite 08/08/2020  ? Headache 06/11/2020  ? Exposure to syphilis 06/11/2020  ? Blurry vision, bilateral 06/11/2020  ? Anal fissure 08/02/2019  ? Foot pain, left 05/20/2019  ? Hypertension 02/21/2019  ? Screening for diabetes mellitus 02/21/2019  ? Anxiety 09/29/2018  ? Lung nodule 06/23/2018  ? External hemorrhoids 02/25/2015  ? Cigarette smoker 05/19/2013  ? Depression 05/19/2013  ? Dental caries 05/19/2013  ? Hx of unilateral orchiectomy 05/19/2013  ? Anal warts 05/06/2013  ? ? ?Patient's Medications  ?New Prescriptions  ? No medications on file  ?Previous Medications  ? ALBUTEROL (VENTOLIN HFA) 108 (90 BASE) MCG/ACT INHALER    Inhale 1-2 puffs into the lungs every 6 (six) hours as needed for wheezing or shortness of breath.  ? AMOXICILLIN (AMOXIL) 500 MG TABLET    Take 500 mg by mouth every 8 (eight) hours.  ? CICLOPIROX (PENLAC) 8 % SOLUTION    Apply topically at bedtime. Apply over nail and surrounding skin. Apply daily over previous coat. After seven (7) days, may remove with alcohol and continue cycle.  ? MOMETASONE-FORMOTEROL (DULERA) 200-5 MCG/ACT AERO    Inhale 2 puffs into the lungs in the morning and at bedtime.  ? TERBINAFINE (LAMISIL) 250 MG TABLET    Take 1 tablet (250 mg total) by mouth daily.  ?Modified Medications  ? Modified Medication Previous Medication  ? BICTEGRAVIR-EMTRICITABINE-TENOFOVIR AF (BIKTARVY) 50-200-25 MG TABS TABLET bictegravir-emtricitabine-tenofovir AF (BIKTARVY) 50-200-25 MG TABS  tablet  ?    Take 1 tablet by mouth daily.    Take 1 tablet by mouth daily.  ?Discontinued Medications  ? No medications on file  ? ? ?Subjective: ?Jeremiah [name] is in for his routine HIV follow-up visit.  He has not had any problems obtaining, taking or tolerating his Biktarvy and has not been missing doses.  He said he has recently had some increase in his chronic cough and wonders if it is due to his continued cigar smoking.  He is still smoking 6 to 7 cigars daily.  He currently has no plans to quit.  He says that he is stressed at work and notes that his chronic anxiety waxes and wanes.  He is not getting any regular exercise.  He is scheduled for circumcision next month. ? ?Review of Systems: ?Review of Systems  ?Constitutional:  Positive for weight loss. Negative for fever.  ?Respiratory:  Positive for cough. Negative for hemoptysis, sputum production and shortness of breath.   ?Cardiovascular:  Negative for chest pain.  ?Psychiatric/Behavioral:  Negative for depression. The patient is nervous/anxious.   ? ?Past Medical History:  ?Diagnosis Date  ? Anal condyloma   ? Anxiety   ? Carpal tunnel syndrome   ? Right  ? COPD with emphysema ([place])   ? last exacerbation 10-16-2020 documented in epic  ? Depression   ? Dyslipidemia   ? Hemorrhoids   ? History of anal intraepithelial neoplasia III 10/2015  ? History  of chlamydia   ? hx multiple rescurrent chlamydia,  last episode 05-20-2021 with positive RPR  ? History of pneumothorax 06/29/2018  ? post procedure , VATS w/ wedge resection RUL mass 06-23-2018  ? HIV (human immunodeficiency virus infection) ([place])   ? followed by dr Lenna Sciara. Megan Salon ([place])  ? Internal hemorrhoids   ? Latent syphilis   ? Phimosis   ? 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[place]) 01/06/2018  ? 06/23/2018-surgical pathology report- lung wedge biopsy necrotizing granulomatous inflammation associated fibrosis, no evidence of malignancy  ? Rectal cancer [facility]) 01/2017  ? followed by GI-- dr s. Candis Schatz;; (previous oncologist in  [place]-- dr Burr Medico);;   treated in [facility],  dx 10/ 2018 and completed chemoradiation 01/ 2019  ? Tooth missing   ? per pt had two lower teeth and one molar extracted due to being broken 07-16-2021, pt stated there was no infection  ? ? ?Social History  ? ?Tobacco Use  ? Smoking status: Every Day  ?  Types: Cigars  ? Smokeless tobacco: Never  ? Tobacco comments:  ?  6 cigars a day-per pt 07-17-2021  ?Vaping Use  ? Vaping Use: Never used  ?Substance Use Topics  ? Alcohol use: Not Currently  ?  Comment: 1.5 pt liquior day  , none since 09/ 2022  ? Drug use: Not Currently  ?  Comment: Past history of crack cocaine use ,  last time 09/ 2022  ? ? ?Family History  ?Problem Relation Age of Onset  ? Diabetes Mother   ? Hypertension Mother   ? Cancer Mother   ?     breast  ? Diabetes Father   ? Hypertension Father   ? Colon cancer Neg Hx   ? Colon polyps Neg Hx   ? Esophageal cancer Neg Hx   ? Stomach cancer Neg Hx   ? Rectal cancer Neg Hx   ? ? ?Allergies  ?Allergen Reactions  ? Nsaids Other (See Comments)  ?  DRUG INTERACTION WITH HIV MEDS  ? Sulfa Antibiotics Itching  ? ? ?Health Maintenance  ?Topic Date Due  ? Zoster Vaccines- Shingrix (1 of 2) Never done  ? COVID-19 Vaccine (3 - Pfizer risk series) 03/06/2021  ? INFLUENZA VACCINE  11/19/2021  ? TETANUS/TDAP  05/19/2029  ? COLONOSCOPY (Pts 45-4yr Insurance coverage will need to be confirmed)  06/29/2031  ? Hepatitis C Screening  Completed  ? HIV Screening  Completed  ? HPV VACCINES  Aged Out  ? ? ?Objective: ? ?Vitals:  ? 08/27/21 1600  ?BP: 122/75  ?Pulse: 72  ?Resp: 16  ?Temp: 99.7 ?F (37.6 ?C)  ?TempSrc: Temporal  ?SpO2: 99%  ?Weight: 175 lb (79.4 kg)  ?Height: '5\' 11"'$  (1.803 m)  ? ?Body mass index is 24.41 kg/m?. ? ?Physical Exam ?Constitutional:   ?   Comments: He is very calm and pleasant as usual.  ?Cardiovascular:  ?   Rate and Rhythm: Normal rate.  ?Pulmonary:  ?   Effort: Pulmonary effort is normal.  ?Psychiatric:     ?   Mood and Affect: Mood normal.   ? ? ?Lab Results ?Lab Results  ?Component Value Date  ? WBC 3.8 07/29/2021  ? HGB 14.6 07/29/2021  ? HCT 41.7 07/29/2021  ? MCV 103.5 (H) 07/29/2021  ? PLT 141 07/29/2021  ?  ?Lab Results  ?Component Value Date  ? CREATININE 1.00 07/29/2021  ? BUN 8 07/29/2021  ? NA 142 07/29/2021  ? K 4.4 07/29/2021  ? CL 109 07/29/2021  ?  CO2 29 07/29/2021  ?  ?Lab Results  ?Component Value Date  ? ALT 18 07/29/2021  ? AST 25 07/29/2021  ? ALKPHOS 51 01/10/2020  ? BILITOT 0.9 07/29/2021  ?  ?Lab Results  ?Component Value Date  ? CHOL 172 07/29/2021  ? HDL 43 07/29/2021  ? Effort 99 07/29/2021  ? TRIG 201 (H) 07/29/2021  ? CHOLHDL 4.0 07/29/2021  ? ?Lab Results  ?Component Value Date  ? LABRPR REACTIVE (A) 07/29/2021  ? RPRTITER 1:2 (H) 07/29/2021  ? ?HIV 1 RNA Quant  ?Date Value  ?07/29/2021 NOT DETECTED copies/mL  ?01/23/2021 Not Detected Copies/mL  ?07/24/2020 Not Detected Copies/mL  ? ?CD4 T Cell Abs (/uL)  ?Date Value  ?07/29/2021 235 (L)  ?01/23/2021 242 (L)  ?07/24/2020 274 (L)  ? ?  ?Problem List Items Addressed This Visit   ? ?  ? Unprioritized  ? Cigarette smoker  ?  I encouraged him to think more about the potential benefits of quitting his cigars. ? ?  ?  ? Anxiety  ?  He has chronic anxiety.  I encouraged him to start getting regular exercise outside of work. ? ?  ?  ?  ? High  ? Human immunodeficiency virus (HIV) disease ([place])  ?  His infection remains under excellent, long-term control.  He will continue Biktarvy and follow-up after lab work in 1 year. ? ?  ?  ? Relevant Medications  ? bictegravir-emtricitabine-tenofovir AF (BIKTARVY) 50-200-25 MG TABS tablet  ? Other Relevant Orders  ? CBC  ? T-helper cells (CD4) count (not at [facility])  ? Comprehensive metabolic panel  ? Lipid panel  ? RPR  ? HIV-1 RNA quant-no reflex-bld  ? ? ? ? ?Michel Bickers, MD ?[facility] for Infectious Disease ?[facility] ?[phone] pager   [phone] cell ?08/27/2021, 4:32 PM ? ?

## 2021-08-27 NOTE — Assessment & Plan Note (Signed)
I encouraged him to think more about the potential benefits of quitting his cigars. ?

## 2021-08-27 NOTE — Assessment & Plan Note (Signed)
He has chronic anxiety.  I encouraged him to start getting regular exercise outside of work. ?

## 2021-09-06 NOTE — Patient Instructions (Addendum)
DUE TO COVID-19 ONLY TWO VISITORS  (aged 51 and older)  ARE ALLOWED TO COME WITH YOU AND STAY IN THE WAITING ROOM ONLY DURING PRE OP AND PROCEDURE.   **NO VISITORS ARE ALLOWED IN THE SHORT STAY AREA OR RECOVERY ROOM!!**  IF YOU WILL BE ADMITTED INTO THE HOSPITAL YOU ARE ALLOWED ONLY FOUR SUPPORT PEOPLE DURING VISITATION HOURS ONLY (7 AM -8PM)   The support person(s) must pass our screening, gel in and out, and wear a mask at all times, including in the patient's room. Patients must also wear a mask when staff or their support person are in the room. Visitors GUEST BADGE MUST BE WORN VISIBLY  One adult visitor may remain with you overnight and MUST be in the room by 8 P.M.     Your procedure is scheduled on: 09/11/21   Report to [facility] Main Entrance    Report to admitting at  8:00 AM   Call this number if you have problems the morning of surgery [phone]   Do not eat food or drink:After Midnight.            If you have questions, please contact your surgeon's office.   FOLLOW BOWEL PREP AND ANY ADDITIONAL PRE OP INSTRUCTIONS YOU RECEIVED FROM YOUR SURGEON'S OFFICE!!!     Oral Hygiene is also important to reduce your risk of infection.                                    Remember - BRUSH YOUR TEETH THE MORNING OF SURGERY WITH YOUR REGULAR TOOTHPASTE   Do NOT smoke after Midnight   Take these medicines the morning of surgery with A SIP OF WATER: Biktarvy, use your inhaler and bring it with you   Bring CPAP mask and tubing day of surgery.                              You may not have any metal on your body including jewelry, and body piercing             Do not wear lotions, powders, perfumes/cologne, or deodorant               Men may shave face and neck.   Do not bring valuables to the hospital. [place].   Contacts, dentures or bridgework may not be worn into surgery.     Patients discharged on the day  of surgery will not be allowed to drive home.  Someone NEEDS to stay with you for the first 24 hours after anesthesia.   Special Instructions: Bring a copy of your healthcare power of attorney and living will documents  the day of surgery if you haven't scanned them before.              Please read over the following fact sheets you were given: IF YOU HAVE QUESTIONS ABOUT YOUR PRE-OP INSTRUCTIONS PLEASE CALL [phone]     [facility] - Preparing for Surgery Before surgery, you can play an important role.  Because skin is not sterile, your skin needs to be as free of germs as possible.  You can reduce the number of germs on your skin by washing with CHG (chlorahexidine gluconate) soap before surgery.  CHG is an antiseptic cleaner which kills germs and bonds with the skin to continue killing germs even after washing. Please DO NOT use if you have an allergy to CHG or antibacterial soaps.  If your skin becomes reddened/irritated stop using the CHG and inform your nurse when you arrive at Short Stay. You may shave your face/neck. Please follow these instructions carefully:  1.  Shower with CHG Soap the night before surgery and the  morning of Surgery.  2.  If you choose to wash your hair, wash your hair first as usual with your  normal  shampoo.  3.  After you shampoo, rinse your hair and body thoroughly to remove the  shampoo.                            4.  Use CHG as you would any other liquid soap.  You can apply chg directly  to the skin and wash                       Gently with a scrungie or clean washcloth.  5.  Apply the CHG Soap to your body ONLY FROM THE NECK DOWN.   Do not use on face/ open                           Wound or open sores. Avoid contact with eyes, ears mouth and genitals (private parts).                       Wash face,  Genitals (private parts) with your normal soap.             6.  Wash thoroughly, paying special attention to the area where your surgery  will be  performed.  7.  Thoroughly rinse your body with warm water from the neck down.  8.  DO NOT shower/wash with your normal soap after using and rinsing off  the CHG Soap.                9.  Pat yourself dry with a clean towel.            10.  Wear clean pajamas.            11.  Place clean sheets on your bed the night of your first shower and do not  sleep with pets. Day of Surgery : Do not apply any lotions/deodorants the morning of surgery.  Please wear clean clothes to the hospital/surgery center.  FAILURE TO FOLLOW THESE INSTRUCTIONS MAY RESULT IN THE CANCELLATION OF YOUR SURGERY    ________________________________________________________________________

## 2021-09-09 ENCOUNTER — Other Ambulatory Visit: Payer: Self-pay

## 2021-09-09 ENCOUNTER — Encounter (HOSPITAL_COMMUNITY): Payer: Self-pay

## 2021-09-09 ENCOUNTER — Encounter (HOSPITAL_COMMUNITY)
Admission: RE | Admit: 2021-09-09 | Discharge: 2021-09-09 | Disposition: A | Discharge status: Home / Self Care | Payer: Medicaid Other | Source: Ambulatory Visit | Attending: Urology | Admitting: Urology

## 2021-09-09 VITALS — BP 122/81 | HR 76 | Temp 98.5°F | Resp 18 | Ht 71.0 in | Wt 174.0 lb

## 2021-09-09 DIAGNOSIS — Z01812 Encounter for preprocedural laboratory examination: Secondary | ICD-10-CM | POA: Diagnosis present

## 2021-09-09 DIAGNOSIS — Z01818 Encounter for other preprocedural examination: Secondary | ICD-10-CM

## 2021-09-09 LAB — CBC
HCT: 40.7 % (ref 39.0–52.0)
Hemoglobin: 14.7 g/dL (ref 13.0–17.0)
MCH: 37.2 pg — ABNORMAL HIGH (ref 26.0–34.0)
MCHC: 36.1 g/dL — ABNORMAL HIGH (ref 30.0–36.0)
MCV: 103 fL — ABNORMAL HIGH (ref 80.0–100.0)
Platelets: 148 10*3/uL — ABNORMAL LOW (ref 150–400)
RBC: 3.95 MIL/uL — ABNORMAL LOW (ref 4.22–5.81)
RDW: 12.6 % (ref 11.5–15.5)
WBC: 6.1 10*3/uL (ref 4.0–10.5)
nRBC: 0 % (ref 0.0–0.2)

## 2021-09-09 NOTE — Progress Notes (Signed)
Anesthesia note:  Bowel prep reminder:NA  PCP - Dr. Jodell Cipro Cardiologist -none Other-   Chest x-ray - 01/30/21-epic EKG - 10/16/20-epic Stress Test - no ECHO - no Cardiac Cath - na  Pacemaker/ICD device last checked:na  Sleep Study - no CPAP -   Pt is pre diabetic-na Fasting Blood Sugar -  Checks Blood Sugar _____  Blood Thinner:na Blood Thinner Instructions: Aspirin Instructions: Last Dose:  Anesthesia review: no  Patient denies shortness of breath, fever, cough and chest pain at PAT appointment Pt has COPD but is able to climb stairs,do housework and ADLs without SOB. He smokes cigars  Patient verbalized understanding of instructions that were given to them at the PAT appointment. Patient was also instructed that they will need to review over the PAT instructions again at home before surgery. Yes. Pt forgot about PST appointment. I called him and instructions were reviewed when he came for his lab draw.

## 2021-09-11 ENCOUNTER — Encounter (HOSPITAL_COMMUNITY): Payer: Self-pay | Admitting: Urology

## 2021-09-11 ENCOUNTER — Ambulatory Visit (HOSPITAL_COMMUNITY)
Admission: RE | Admit: 2021-09-11 | Discharge: 2021-09-11 | Disposition: A | Discharge status: Home / Self Care | Payer: Medicaid Other | Attending: Urology | Admitting: Urology

## 2021-09-11 ENCOUNTER — Encounter (HOSPITAL_COMMUNITY)
Admission: RE | Disposition: A | Discharge status: Home / Self Care | Payer: Self-pay | Source: Home / Self Care | Attending: Urology

## 2021-09-11 ENCOUNTER — Ambulatory Visit (HOSPITAL_COMMUNITY): Payer: Medicaid Other | Admitting: Anesthesiology

## 2021-09-11 ENCOUNTER — Ambulatory Visit (HOSPITAL_BASED_OUTPATIENT_CLINIC_OR_DEPARTMENT_OTHER): Payer: Medicaid Other | Admitting: Anesthesiology

## 2021-09-11 DIAGNOSIS — F418 Other specified anxiety disorders: Secondary | ICD-10-CM | POA: Diagnosis not present

## 2021-09-11 DIAGNOSIS — N480 Leukoplakia of penis: Secondary | ICD-10-CM | POA: Diagnosis not present

## 2021-09-11 DIAGNOSIS — Z21 Asymptomatic human immunodeficiency virus [HIV] infection status: Secondary | ICD-10-CM | POA: Insufficient documentation

## 2021-09-11 DIAGNOSIS — N4889 Other specified disorders of penis: Secondary | ICD-10-CM | POA: Diagnosis not present

## 2021-09-11 DIAGNOSIS — F32A Depression, unspecified: Secondary | ICD-10-CM | POA: Diagnosis not present

## 2021-09-11 DIAGNOSIS — F17210 Nicotine dependence, cigarettes, uncomplicated: Secondary | ICD-10-CM

## 2021-09-11 DIAGNOSIS — N471 Phimosis: Secondary | ICD-10-CM | POA: Insufficient documentation

## 2021-09-11 DIAGNOSIS — I10 Essential (primary) hypertension: Secondary | ICD-10-CM | POA: Insufficient documentation

## 2021-09-11 DIAGNOSIS — R519 Headache, unspecified: Secondary | ICD-10-CM | POA: Diagnosis not present

## 2021-09-11 DIAGNOSIS — F419 Anxiety disorder, unspecified: Secondary | ICD-10-CM | POA: Insufficient documentation

## 2021-09-11 DIAGNOSIS — Z85048 Personal history of other malignant neoplasm of rectum, rectosigmoid junction, and anus: Secondary | ICD-10-CM | POA: Insufficient documentation

## 2021-09-11 DIAGNOSIS — F17290 Nicotine dependence, other tobacco product, uncomplicated: Secondary | ICD-10-CM | POA: Insufficient documentation

## 2021-09-11 DIAGNOSIS — J449 Chronic obstructive pulmonary disease, unspecified: Secondary | ICD-10-CM | POA: Insufficient documentation

## 2021-09-11 HISTORY — PX: CIRCUMCISION: SHX1350

## 2021-09-11 SURGERY — CIRCUMCISION, ADULT
Anesthesia: General

## 2021-09-11 MED ORDER — PROPOFOL 10 MG/ML IV BOLUS
INTRAVENOUS | Status: DC | PRN
  Administered 2021-09-11: 160 mg via INTRAVENOUS

## 2021-09-11 MED ORDER — PHENYLEPHRINE 80 MCG/ML (10ML) SYRINGE FOR IV PUSH (FOR BLOOD PRESSURE SUPPORT)
PREFILLED_SYRINGE | INTRAVENOUS | Status: DC | PRN
  Administered 2021-09-11: 80 ug via INTRAVENOUS
  Administered 2021-09-11: 160 ug via INTRAVENOUS

## 2021-09-11 MED ORDER — TRAMADOL HCL 50 MG PO TABS: 50.0000 mg | ORAL_TABLET | Freq: Four times a day (QID) | ORAL | 0 refills | Status: AC | PRN

## 2021-09-11 MED ORDER — FENTANYL CITRATE PF 50 MCG/ML IJ SOSY
PREFILLED_SYRINGE | INTRAMUSCULAR | Status: AC
  Filled 2021-09-11: qty 1

## 2021-09-11 MED ORDER — DEXAMETHASONE SODIUM PHOSPHATE 10 MG/ML IJ SOLN
INTRAMUSCULAR | Status: AC
  Filled 2021-09-11: qty 1

## 2021-09-11 MED ORDER — 0.9 % SODIUM CHLORIDE (POUR BTL) OPTIME
TOPICAL | Status: DC | PRN
  Administered 2021-09-11: 1000 mL

## 2021-09-11 MED ORDER — OXYCODONE HCL 5 MG PO TABS: 5.0000 mg | ORAL_TABLET | Freq: Once | ORAL | Status: DC | PRN

## 2021-09-11 MED ORDER — LACTATED RINGERS IV SOLN
INTRAVENOUS | Status: DC
Start: 2021-09-11 — End: 2021-09-11

## 2021-09-11 MED ORDER — BUPIVACAINE HCL (PF) 0.25 % IJ SOLN
INTRAMUSCULAR | Status: AC
  Filled 2021-09-11: qty 30

## 2021-09-11 MED ORDER — ORAL CARE MOUTH RINSE: 15.0000 mL | Freq: Once | OROMUCOSAL | Status: AC

## 2021-09-11 MED ORDER — DEXAMETHASONE SODIUM PHOSPHATE 10 MG/ML IJ SOLN
INTRAMUSCULAR | Status: DC | PRN
Start: 2021-09-11 — End: 2021-09-11
  Administered 2021-09-11: 10 mg via INTRAVENOUS

## 2021-09-11 MED ORDER — FENTANYL CITRATE PF 50 MCG/ML IJ SOSY
25.0000 ug | PREFILLED_SYRINGE | INTRAMUSCULAR | Status: DC | PRN
  Administered 2021-09-11 (×2): 50 ug via INTRAVENOUS

## 2021-09-11 MED ORDER — OXYCODONE HCL 5 MG/5ML PO SOLN: 5.0000 mg | Freq: Once | ORAL | Status: DC | PRN

## 2021-09-11 MED ORDER — MIDAZOLAM HCL 5 MG/5ML IJ SOLN
INTRAMUSCULAR | Status: DC | PRN
  Administered 2021-09-11: 2 mg via INTRAVENOUS

## 2021-09-11 MED ORDER — MIDAZOLAM HCL 2 MG/2ML IJ SOLN
INTRAMUSCULAR | Status: AC
  Filled 2021-09-11: qty 2

## 2021-09-11 MED ORDER — CEFAZOLIN SODIUM-DEXTROSE 2-4 GM/100ML-% IV SOLN
2.0000 g | INTRAVENOUS | Status: AC
  Administered 2021-09-11: 2 g via INTRAVENOUS
  Filled 2021-09-11: qty 100

## 2021-09-11 MED ORDER — ONDANSETRON HCL 4 MG/2ML IJ SOLN: 4.0000 mg | Freq: Once | INTRAMUSCULAR | Status: DC | PRN

## 2021-09-11 MED ORDER — BUPIVACAINE-EPINEPHRINE (PF) 0.25% -1:200000 IJ SOLN
INTRAMUSCULAR | Status: AC
  Filled 2021-09-11: qty 30

## 2021-09-11 MED ORDER — LIDOCAINE 2% (20 MG/ML) 5 ML SYRINGE
INTRAMUSCULAR | Status: DC | PRN
  Administered 2021-09-11: 80 mg via INTRAVENOUS

## 2021-09-11 MED ORDER — DEXMEDETOMIDINE (PRECEDEX) IN NS 20 MCG/5ML (4 MCG/ML) IV SYRINGE
PREFILLED_SYRINGE | INTRAVENOUS | Status: AC
  Filled 2021-09-11: qty 5

## 2021-09-11 MED ORDER — ACETAMINOPHEN 500 MG PO TABS
1000.0000 mg | ORAL_TABLET | Freq: Once | ORAL | Status: AC
  Administered 2021-09-11: 1000 mg via ORAL
  Filled 2021-09-11: qty 2

## 2021-09-11 MED ORDER — DEXMEDETOMIDINE (PRECEDEX) IN NS 20 MCG/5ML (4 MCG/ML) IV SYRINGE
PREFILLED_SYRINGE | INTRAVENOUS | Status: DC | PRN
  Administered 2021-09-11 (×3): 4 ug via INTRAVENOUS

## 2021-09-11 MED ORDER — LIDOCAINE HCL (PF) 2 % IJ SOLN
INTRAMUSCULAR | Status: AC
  Filled 2021-09-11: qty 5

## 2021-09-11 MED ORDER — FENTANYL CITRATE (PF) 100 MCG/2ML IJ SOLN
INTRAMUSCULAR | Status: AC
  Filled 2021-09-11: qty 2

## 2021-09-11 MED ORDER — FENTANYL CITRATE (PF) 100 MCG/2ML IJ SOLN
INTRAMUSCULAR | Status: DC | PRN
  Administered 2021-09-11: 50 ug via INTRAVENOUS
  Administered 2021-09-11 (×2): 25 ug via INTRAVENOUS

## 2021-09-11 MED ORDER — PROPOFOL 10 MG/ML IV BOLUS
INTRAVENOUS | Status: AC
  Filled 2021-09-11: qty 20

## 2021-09-11 MED ORDER — BACITRACIN ZINC 500 UNIT/GM EX OINT
TOPICAL_OINTMENT | CUTANEOUS | Status: AC
  Filled 2021-09-11: qty 28.35

## 2021-09-11 MED ORDER — BACITRACIN ZINC 500 UNIT/GM EX OINT
TOPICAL_OINTMENT | CUTANEOUS | Status: DC | PRN
  Administered 2021-09-11: 1 via TOPICAL

## 2021-09-11 MED ORDER — GLYCOPYRROLATE 0.2 MG/ML IJ SOLN
INTRAMUSCULAR | Status: DC | PRN
  Administered 2021-09-11: .2 mg via INTRAVENOUS

## 2021-09-11 MED ORDER — BUPIVACAINE HCL (PF) 0.25 % IJ SOLN
INTRAMUSCULAR | Status: DC | PRN
  Administered 2021-09-11: 18 mL

## 2021-09-11 MED ORDER — CHLORHEXIDINE GLUCONATE 0.12 % MT SOLN
15.0000 mL | Freq: Once | OROMUCOSAL | Status: AC
  Administered 2021-09-11: 15 mL via OROMUCOSAL

## 2021-09-11 MED ORDER — ONDANSETRON HCL 4 MG/2ML IJ SOLN
INTRAMUSCULAR | Status: AC
  Filled 2021-09-11: qty 2

## 2021-09-11 MED ORDER — ONDANSETRON HCL 4 MG/2ML IJ SOLN
INTRAMUSCULAR | Status: DC | PRN
  Administered 2021-09-11: 4 mg via INTRAVENOUS

## 2021-09-11 SURGICAL SUPPLY — 26 items
BAG COUNTER SPONGE SURGICOUNT (BAG) IMPLANT
BLADE SURG 15 STRL LF DISP TIS (BLADE) ×1 IMPLANT
BLADE SURG 15 STRL SS (BLADE) ×2
BNDG COHESIVE 1X5 TAN STRL LF (GAUZE/BANDAGES/DRESSINGS) ×2 IMPLANT
BNDG CONFORM 2 STRL LF (GAUZE/BANDAGES/DRESSINGS) ×2 IMPLANT
COVER SURGICAL LIGHT HANDLE (MISCELLANEOUS) ×2 IMPLANT
DRAPE LAPAROTOMY T 98X78 PEDS (DRAPES) ×2 IMPLANT
ELECT NDL TIP 2.8 STRL (NEEDLE) ×1 IMPLANT
ELECT NEEDLE TIP 2.8 STRL (NEEDLE) ×2 IMPLANT
ELECT PENCIL ROCKER SW 15FT (MISCELLANEOUS) ×2 IMPLANT
ELECT REM PT RETURN 15FT ADLT (MISCELLANEOUS) ×2 IMPLANT
GAUZE PETROLATUM 1 X8 (GAUZE/BANDAGES/DRESSINGS) ×2 IMPLANT
GLOVE SURG LX 7.5 STRW (GLOVE) ×1
GLOVE SURG LX STRL 7.5 STRW (GLOVE) ×1 IMPLANT
GOWN STRL REUS W/ TWL LRG LVL3 (GOWN DISPOSABLE) ×1 IMPLANT
GOWN STRL REUS W/TWL LRG LVL3 (GOWN DISPOSABLE) ×2
KIT BASIN OR (CUSTOM PROCEDURE TRAY) ×2 IMPLANT
KIT TURNOVER KIT A (KITS) IMPLANT
NS IRRIG 1000ML POUR BTL (IV SOLUTION) ×2 IMPLANT
PACK BASIC VI WITH GOWN DISP (CUSTOM PROCEDURE TRAY) ×2 IMPLANT
SUT CHROMIC 3 0 SH 27 (SUTURE) ×4 IMPLANT
SUT PROLENE 4 0 P 3 18 (SUTURE) IMPLANT
SYR CONTROL 10ML LL (SYRINGE) ×2 IMPLANT
TOWEL OR 17X26 10 PK STRL BLUE (TOWEL DISPOSABLE) IMPLANT
TOWEL OR NON WOVEN STRL DISP B (DISPOSABLE) ×2 IMPLANT
WATER STERILE IRR 1000ML POUR (IV SOLUTION) IMPLANT

## 2021-09-11 NOTE — Anesthesia Procedure Notes (Signed)
Procedure Name: LMA Insertion Date/Time: 09/11/2021 10:27 AM Performed by: Lord Lancour D, CRNA Pre-anesthesia Checklist: Patient identified, Emergency Drugs available, Suction available and Patient being monitored Patient Re-evaluated:Patient Re-evaluated prior to induction Oxygen Delivery Method: Circle system utilized Preoxygenation: Pre-oxygenation with 100% oxygen Induction Type: IV induction Ventilation: Mask ventilation without difficulty LMA: LMA inserted LMA Size: 4.0 Tube type: Oral Number of attempts: 1 Placement Confirmation: positive ETCO2 and breath sounds checked- equal and bilateral Tube secured with: Tape Dental Injury: Teeth and Oropharynx as per pre-operative assessment

## 2021-09-11 NOTE — Op Note (Signed)
Operative Note  Preoperative diagnosis:  1.  Phimosis of the penile foreskin 2.  Penile lesion  Postoperative diagnosis: Same  Procedure(s): 1.  Adult circumcision  Surgeon: Ellison Hughs, MD  Assistants:  None  Anesthesia:  General  Complications:  None  EBL: 5 mL  Specimens: 1.  Foreskin  Drains/Catheters: 1.  None  Intraoperative findings:   2 x 2 centimeter submucosal lesion along the right distal corporal body Phimosis with penile foreskin  Indication:  Jeremiah [name] is a 51 y.o. male with phimosis and in a submucosal lesion involving the foreskin.  He has a significant past medical history of HIV, HPV, anal cancer, syphilis, GC/chlamydia.  He has been consented for the above procedures, voices understanding and wishes to proceed. Description of procedure:  After informed consent was obtained, the patient was brought to the operating room and general LMA anesthesia was administered.  The patient was prepped and draped in the usual fashion.  A timeout was then performed.  Circumferential marks were then made with the first being with the foreskin in the anatomic position along the glandular impression on the second being with the foreskin retracted approximately 1 cm from the coronal sulcus.  The marks were then incised using electrocautery and the excess foreskin was excised.  The penile shaft skin was then reapproximated using interrupted 3-0 chromic suture.  A penile ring block was then performed using quarter percent Marcaine without epinephrine.  The penis was then dressed in the usual fashion.  The patient tolerated the procedure well and was transferred to the postanesthesia unit in stable condition.   Plan: Discharge home

## 2021-09-11 NOTE — Anesthesia Preprocedure Evaluation (Addendum)
Anesthesia Evaluation  Patient identified by MRN, date of birth, ID band Patient awake    Reviewed: Allergy & Precautions, NPO status , Patient's Chart, lab work & pertinent test results  History of Anesthesia Complications Negative for: history of anesthetic complications  Airway Mallampati: II  TM Distance: >3 FB Neck ROM: Full    Dental  (+) Dental Advisory Given, Edentulous Upper   Pulmonary COPD,  COPD inhaler, Current Smoker and Patient abstained from smoking.,    Pulmonary exam normal        Cardiovascular hypertension (no meds x 1 yr), Normal cardiovascular exam     Neuro/Psych  Headaches, PSYCHIATRIC DISORDERS Anxiety Depression    GI/Hepatic Neg liver ROS,  Anal cancer    Endo/Other  negative endocrine ROS  Renal/GU negative Renal ROS     Musculoskeletal negative musculoskeletal ROS (+)   Abdominal   Peds  Hematology  (+) HIV,   Anesthesia Other Findings Latent syphilis   Reproductive/Obstetrics                            Anesthesia Physical Anesthesia Plan  ASA: 3  Anesthesia Plan: General   Post-op Pain Management: Tylenol PO (pre-op)*   Induction: Intravenous  PONV Risk Score and Plan: 1 and Treatment may vary due to age or medical condition, Ondansetron and Midazolam  Airway Management Planned: LMA  Additional Equipment: None  Intra-op Plan:   Post-operative Plan: Extubation in OR  Informed Consent: I have reviewed the patients History and Physical, chart, labs and discussed the procedure including the risks, benefits and alternatives for the proposed anesthesia with the patient or authorized representative who has indicated his/her understanding and acceptance.     Dental advisory given  Plan Discussed with: CRNA and Anesthesiologist  Anesthesia Plan Comments:        Anesthesia Quick Evaluation

## 2021-09-11 NOTE — Anesthesia Postprocedure Evaluation (Signed)
Anesthesia Post Note  Patient: Jeremiah [name]  Procedure(s) Performed: CIRCUMCISION ADULT     Patient location during evaluation: PACU Anesthesia Type: General Level of consciousness: awake and alert Pain management: pain level controlled Vital Signs Assessment: post-procedure vital signs reviewed and stable Respiratory status: spontaneous breathing, nonlabored ventilation and respiratory function stable Cardiovascular status: stable and blood pressure returned to baseline Anesthetic complications: no   No notable events documented.  Last Vitals:  Vitals:   09/11/21 1205 09/11/21 1226  BP: (!) 118/92 (!) 114/93  Pulse: 63 67  Resp: 16 16  Temp: 36.5 C 36.6 C  SpO2: 97% 98%    Last Pain:  Vitals:   09/11/21 1226  TempSrc: Oral  PainSc: 3                  Audry Pili

## 2021-09-11 NOTE — H&P (Signed)
Urology Preoperative H&P   Chief Complaint: Penile lesion   History of Present Illness: Jeremiah [name] is a 51 y.o. male referred by Lottie Mussel, MD after he was found to have phimosis and possible balanitis on physical exam. He has a significant past medical history of HIV, HPV, anal cancer, syphilis, GC/chlamydia.   The patient states that he had unprotected intercourse approximately 1 month ago and noticed a small laceration on the foreskin that progressed into a nodular lesion and has persisted since then. He states that the lesion is somewhat tender to palpation, but not overly bothersome. He reports no urinary issues. He has been using a topical antifungal cream that has helped alleviate, at least partially, his phimosis.    Past Medical History:  Diagnosis Date   Anal condyloma    Anxiety    Carpal tunnel syndrome    Right   COPD with emphysema ([place])    last exacerbation 10-16-2020 documented in epic   Depression    Dyslipidemia    Hemorrhoids    History of anal intraepithelial neoplasia III 10/2015   History of chlamydia    hx multiple rescurrent chlamydia,  last episode 05-20-2021 with positive RPR   History of pneumothorax 06/29/2018   post procedure , VATS w/ wedge resection RUL mass 06-23-2018   HIV (human immunodeficiency virus infection) ([place])    followed by dr Lenna Sciara. Megan Salon (ID)   Internal hemorrhoids    Latent syphilis    Phimosis    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[place]) 01/06/2018   06/23/2018-surgical pathology report- lung wedge biopsy necrotizing granulomatous inflammation associated fibrosis, no evidence of malignancy   Rectal cancer ([place]) 01/2017   followed by GI-- dr s. Candis Schatz;; (previous oncologist in [place]-- dr Burr Medico);;   treated in [facility],  dx 10/ 2018 and completed chemoradiation 01/ 2019   Tooth missing    per pt had two lower teeth and one molar extracted due to being broken 07-16-2021, pt stated there was no infection    Past Surgical History:   Procedure Laterality Date   COLONOSCOPY WITH PROPOFOL  06/28/2021   by dr s. Candis Schatz   IR RADIOLOGIST EVAL & MGMT  05/17/2019   IR RADIOLOGY PERIPHERAL GUIDED IV START  05/20/2019   IR TRANSCATH RETRIEVAL FB INCL GUIDANCE (MS)  05/20/2019   IR US GUIDE VASC ACCESS RIGHT  05/20/2019   LAPAROSCOPIC APPENDECTOMY N/A 01/11/2020   Procedure: APPENDECTOMY LAPAROSCOPIC;  Surgeon: Coralie Keens, MD;  Location: [place];  Service: General;  Laterality: N/A;   [place]  2016   unilateral   PORT-A-CATH REMOVAL N/A 05/11/2019   Procedure: PARTIAL PORT REMOVAL;  Surgeon: Leighton Ruff, MD;  Location: [place];  Service: General;  Laterality: N/A;   VIDEO ASSISTED THORACOSCOPY (VATS)/WEDGE RESECTION Right 06/23/2018   Procedure: VIDEO ASSISTED THORACOSCOPY (VATS)/LUNG RESECTION, stapling and dissection of apical bleb, wedge resection of right upper lobe mass, lymph node dissection, intercostal nerve block;  Surgeon: Grace Isaac, MD;  Location: [place];  Service: Thoracic;  Laterality: Right;   VIDEO BRONCHOSCOPY N/A 06/23/2018   Procedure: VIDEO BRONCHOSCOPY;  Surgeon: Grace Isaac, MD;  Location: [place];  Service: Thoracic;  Laterality: N/A;    Allergies:  Allergies  Allergen Reactions   Nsaids Other (See Comments)    DRUG INTERACTION WITH HIV MEDS   Sulfa Antibiotics Itching    Family History  Problem Relation Age of Onset   Diabetes Mother  Hypertension Mother    Cancer Mother        breast   Diabetes Father    Hypertension Father    Colon cancer Neg Hx    Colon polyps Neg Hx    Esophageal cancer Neg Hx    Stomach cancer Neg Hx    Rectal cancer Neg Hx     Social History:  reports that he has been smoking cigars. He has never used smokeless tobacco. He reports that he does not currently use alcohol. He reports that he does not currently use drugs.  ROS: A complete review of systems was performed.   All systems are negative except for pertinent findings as noted.  Physical Exam:  Vital signs in last 24 hours: Temp:  [98.2 F (36.8 C)] 98.2 F (36.8 C) (05/24 0826) Pulse Rate:  [68] 68 (05/24 0826) Resp:  [18] 18 (05/24 0826) BP: (126)/(81) 126/81 (05/24 0826) SpO2:  [100 %] 100 % (05/24 0826) Weight:  [78.9 kg] 78.9 kg (05/24 0816) Constitutional:  Alert and oriented, No acute distress  GU PHYSICAL EXAMINATION:    Scrotum: No lesions. No edema. No cysts. No warts.  Epididymides: Right: no spermatocele, no masses, no cysts, no tenderness, no induration, no enlargement. Left: no spermatocele, no masses, no cysts, no tenderness, no induration, no enlargement.  Testes: No tenderness, no swelling, no enlargement left testes. No tenderness, no swelling, no enlargement right testes. Normal location left testes. Normal location right testes. No mass, no cyst, no varicocele, no hydrocele left testes. No mass, no cyst, no varicocele, no hydrocele right testes.  Urethral Meatus: Normal size. No lesion, no wart, no discharge, no polyp. Normal location.  Penis: Penis uncircumcised. There is a 1 x 1 cm freely mobile and mildly tender nodule on the foreskin that is on the right's distal aspects. No evidence of skin ulceration or purulent discharge.   Lymphatic: No lymphadenopathy Neurologic: Grossly intact, no focal deficits Psychiatric: Normal mood and affect  Laboratory Data:  Recent Labs    09/09/21 1242  WBC 6.1  HGB 14.7  HCT 40.7  PLT 148*    No results for input(s): NA, K, CL, GLUCOSE, BUN, CALCIUM, CREATININE in the last 72 hours.  Invalid input(s): CO3   No results found for this or any previous visit (from the past 24 hour(s)). No results found for this or any previous visit (from the past 240 hour(s)).  Renal Function: No results for input(s): CREATININE in the last 168 hours. CrCl cannot be calculated (Patient's most recent lab result is older than the maximum 21 days  allowed.).  Radiologic Imaging: No results found.  I independently reviewed the above imaging studies.  Assessment and Plan Jeremiah [name] is a 51 y.o. male with phimosis along with a nodular lesion involving the penile foreskin    -The risks, benefits and alternatives of adult circumcision was discussed with the patient. The risks included, but are not limited to, bleeding, infection, pain, MI, CVA, DVT, PE and the inherent risks of general anesthesia.   Ellison Hughs, MD 09/11/2021, 9:38 AM  [facility] Pager: [phone]

## 2021-09-11 NOTE — Transfer of Care (Signed)
Immediate Anesthesia Transfer of Care Note  Patient: [name]  Procedure(s) Performed: CIRCUMCISION ADULT  Patient Location: PACU  Anesthesia Type:General  Level of Consciousness: awake, alert  and oriented  Airway & Oxygen Therapy: Patient Spontanous Breathing and Patient connected to face mask oxygen  Post-op Assessment: Report given to RN and Post -op Vital signs reviewed and stable  Post vital signs: Reviewed and stable  Last Vitals:  Vitals Value Taken Time  BP 119/87 09/11/21 1137  Temp    Pulse 84 09/11/21 1138  Resp 15 09/11/21 1138  SpO2 98 % 09/11/21 1138  Vitals shown include unvalidated device data.  Last Pain:  Vitals:   09/11/21 0827  TempSrc:   PainSc: 0-No pain         Complications: No notable events documented.

## 2021-09-12 ENCOUNTER — Encounter (HOSPITAL_COMMUNITY): Payer: Self-pay | Admitting: Urology

## 2021-09-12 LAB — SURGICAL PATHOLOGY

## 2021-09-29 ENCOUNTER — Encounter (HOSPITAL_COMMUNITY): Payer: Self-pay | Admitting: Emergency Medicine

## 2021-09-29 ENCOUNTER — Other Ambulatory Visit: Payer: Self-pay

## 2021-09-29 ENCOUNTER — Emergency Department (HOSPITAL_COMMUNITY)
Admission: EM | Admit: 2021-09-29 | Discharge: 2021-09-30 | Disposition: A | Discharge status: Home / Self Care | Payer: Medicaid Other | Attending: Emergency Medicine | Admitting: Emergency Medicine

## 2021-09-29 DIAGNOSIS — T8131XA Disruption of external operation (surgical) wound, not elsewhere classified, initial encounter: Secondary | ICD-10-CM | POA: Diagnosis present

## 2021-09-29 DIAGNOSIS — T8130XA Disruption of wound, unspecified, initial encounter: Secondary | ICD-10-CM

## 2021-09-29 DIAGNOSIS — Z21 Asymptomatic human immunodeficiency virus [HIV] infection status: Secondary | ICD-10-CM | POA: Insufficient documentation

## 2021-09-29 NOTE — ED Triage Notes (Signed)
Patient arrives ambulatory stating he had a circumcision on 5/24 and this evening the incision site ripped open. Reports bleeding initially that subsided.

## 2021-09-29 NOTE — ED Provider Notes (Signed)
[facility] DEPT Provider Note   CSN: [id number] Arrival date & time: 09/29/21  1837     History  Chief Complaint  Patient presents with   Post-op Problem    Jeremiah [name] is a 51 y.o. male.  HPI Patient presents with postoperative complication.  Patient underwent circumcision on May 24.  This was performed for phimosis.  Patient reports he has done well postoperatively until tonight.  About 5 hours ago patient was masturbating and he reports one of the sutures "ripped "open Reports he had some bleeding that is now improved.  No fevers or chills.  He is able to urinate  Home Medications Prior to Admission medications   Medication Sig Start Date End Date Taking? Authorizing Provider  albuterol (VENTOLIN HFA) 108 (90 Base) MCG/ACT inhaler Inhale 1-2 puffs into the lungs every 6 (six) hours as needed for wheezing or shortness of breath. 05/21/20   Lacinda Axon, MD  bictegravir-emtricitabine-tenofovir AF (BIKTARVY) 50-200-25 MG TABS tablet Take 1 tablet by mouth daily. 08/27/21   Michel Bickers, MD  ciclopirox [place]) 8 % solution Apply topically at bedtime. Apply over nail and surrounding skin. Apply daily over previous coat. After seven (7) days, may remove with alcohol and continue cycle. Patient taking differently: Apply 1 application. topically at bedtime as needed (nail fungus). 07/08/21   Lorenda Peck, DPM  mometasone-formoterol (DULERA) 200-5 MCG/ACT AERO Inhale 2 puffs into the lungs in the morning and at bedtime. Patient taking differently: Inhale 2 puffs into the lungs 2 (two) times daily. 05/20/21   Marshell Garfinkel, MD      Allergies    Nsaids and Sulfa antibiotics    Review of Systems   Review of Systems  Physical Exam Updated Vital Signs BP 128/86   Pulse 60   Temp 98.4 F (36.9 C) (Oral)   Resp 17   Ht 1.803 m ('5\' 11"'$ )   Wt 78.5 kg   SpO2 100%   BMI 24.13 kg/m  Physical Exam CONSTITUTIONAL: Well developed/well  nourished HEAD: Normocephalic/atraumatic GU: Genital exam performed with chaperone present.  On the ventral surface of penis there is wound dehiscence.  It is approximately 2 cm without any active bleeding or drainage NEURO: Pt is awake/alert/appropriate, moves all extremitiesx4.  No facial droop.   EXTREMITIES:full ROM SKIN: warm, color normal PSYCH: no abnormalities of mood noted, alert and oriented to situation  ED Results / Procedures / Treatments   Labs (all labs ordered are listed, but only abnormal results are displayed) Labs Reviewed - No data to display  EKG None  Radiology No results found.  Procedures Procedures    Medications Ordered in ED Medications - No data to display  ED Course/ Medical Decision Making/ A&P Clinical Course as of 09/29/21 2336  Sun Sep 29, 2021  2334 Discussed case with on-call urologist Rosine Door.  We discussed the case.  Recommends leaving wound open, and having him call urology tomorrow morning.  Wound is without any active bleeding or signs of infection.  Patient is able to urinate.  No other acute intervention is recommended [DW]    Clinical Course User Index [DW] Ripley Fraise, MD                           Medical Decision Making  Patient with history of HIV presents with wound dehiscence.  This occurred at the site of the circumcision after he was masturbating.  No signs of wound infection.  No signs of any active bleeding.  He is able to urinate.  Patient will be discharged home with close urology follow-up        Final Clinical Impression(s) / ED Diagnoses Final diagnoses:  Wound dehiscence    Rx / DC Orders ED Discharge Orders     None         Ripley Fraise, MD 09/29/21 2339

## 2021-09-29 NOTE — ED Notes (Signed)
After provider left the room pt. States, "since ive been here for so long, just tell the nurse to email me my paperwork." Pt. Was informed 3 times that discharge paperwork cannot be emailed. Pt. Walked out of the ED with out receiving paperwork and without getting last set of VS.

## 2021-09-29 NOTE — Discharge Instructions (Addendum)
Please keep the wound clean and dry.  Please call your urology doctor tomorrow morning

## 2021-10-08 ENCOUNTER — Ambulatory Visit (INDEPENDENT_AMBULATORY_CARE_PROVIDER_SITE_OTHER): Payer: Medicaid Other | Admitting: Internal Medicine

## 2021-10-08 DIAGNOSIS — F330 Major depressive disorder, recurrent, mild: Secondary | ICD-10-CM | POA: Diagnosis not present

## 2021-10-08 DIAGNOSIS — J439 Emphysema, unspecified: Secondary | ICD-10-CM | POA: Diagnosis not present

## 2021-10-08 NOTE — Assessment & Plan Note (Signed)
PHQ-9 score of 10 today.  -Will address at next visit next week

## 2021-10-08 NOTE — Assessment & Plan Note (Addendum)
The patient is here today to discuss his medication regimen. Followed by [name] of pulmonology with last visit in April 2022. At that time, recommended trelegy however PA for this was denied and was told he needed to try dulera, symbicort, and advair first.  Today, he is requesting trelegy specifically stating that albuterol, dulera, and symbicort have not helped him. States that currently he is "having trouble breathing" but states that this normally happens for him when he runs out of his medication. Denies CP or significant SOB otherwise and states that he does not need to go to the ED. When asked specifically about Advair, he states that this has not worked for him though do not see documentation that he has tried Advair.  Plan: Will give trelegy sample in the clinic and schedule the patient for an appt next week. Pt is in agreement with this plan. Return precautions discussed.   To my knowledge, he has not recently been on a LAMA. Could consider discussion of starting advair, spiriva, and albuterol at next visit as a long-term solution.    Medication Samples have been provided to the patient.  Drug name: Trelegy       Strength: 100 mcg        Qty: 1  LOT: [id number]  Exp.Date: Jul 2024  Dosing instructions: Per package instructions  The patient has been instructed regarding the correct time, dose, and frequency of taking this medication, including desired effects and most common side effects.   Orvis Brill 9:44 AM 10/08/2021

## 2021-10-08 NOTE — Progress Notes (Signed)
  [facility] Telephone Encounter Continuity Care Appointment  HPI:  This telephone encounter was created for [name] on 10/08/2021 for the following purpose/cc discussion of medication regimen.   Past Medical History:  Past Medical History:  Diagnosis Date   Anal condyloma    Anxiety    Carpal tunnel syndrome    Right   COPD with emphysema ([place])    last exacerbation 10-16-2020 documented in epic   Depression    Dyslipidemia    Hemorrhoids    History of anal intraepithelial neoplasia III 10/2015   History of chlamydia    hx multiple rescurrent chlamydia,  last episode 05-20-2021 with positive RPR   History of pneumothorax 06/29/2018   post procedure , VATS w/ wedge resection RUL mass 06-23-2018   HIV (human immunodeficiency virus infection) ([place])    followed by dr Lenna Sciara. Megan Salon (ID)   Internal hemorrhoids    Latent syphilis    Phimosis    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[place]) 01/06/2018   06/23/2018-surgical pathology report- lung wedge biopsy necrotizing granulomatous inflammation associated fibrosis, no evidence of malignancy   Rectal cancer ([place]) 01/2017   followed by GI-- dr s. Candis Schatz;; (previous oncologist in [place]-- dr Burr Medico);;   treated in [facility],  dx 10/ 2018 and completed chemoradiation 01/ 2019   Tooth missing    per pt had two lower teeth and one molar extracted due to being broken 07-16-2021, pt stated there was no infection     ROS:     Assessment / Plan / Recommendations:  Please see A&P under problem oriented charting for assessment of the patient's acute and chronic medical conditions.  As always, pt is advised that if symptoms worsen or new symptoms arise, they should go to an urgent care facility or to to ER for further evaluation.   Consent and Medical Decision Making:  Patient discussed with Dr.  Saverio Danker This is a telephone encounter between Jeremiah [name] and Jeremiah [name] on 10/08/2021 for discussion of  medication regimen. The visit was conducted with the patient located at home and Jeremiah [name] at [facility]. The patient's identity was confirmed using their DOB and current address. The patient has consented to being evaluated through a telephone encounter and understands the associated risks (an examination cannot be done and the patient may need to come in for an appointment) / benefits (allows the patient to remain at home, decreasing exposure to coronavirus). I personally spent 15 minutes on medical discussion.

## 2021-10-14 ENCOUNTER — Telehealth: Payer: Self-pay | Admitting: *Deleted

## 2021-10-15 ENCOUNTER — Encounter: Payer: Medicaid Other | Admitting: Internal Medicine

## 2021-10-16 ENCOUNTER — Encounter: Payer: Self-pay | Admitting: Student

## 2021-10-16 ENCOUNTER — Ambulatory Visit (HOSPITAL_COMMUNITY)
Admission: RE | Admit: 2021-10-16 | Discharge: 2021-10-16 | Disposition: A | Discharge status: Home / Self Care | Payer: Medicaid Other | Source: Ambulatory Visit | Attending: Internal Medicine | Admitting: Internal Medicine

## 2021-10-16 ENCOUNTER — Ambulatory Visit (INDEPENDENT_AMBULATORY_CARE_PROVIDER_SITE_OTHER): Payer: Medicaid Other | Admitting: Student

## 2021-10-16 ENCOUNTER — Other Ambulatory Visit: Payer: Self-pay

## 2021-10-16 DIAGNOSIS — I10 Essential (primary) hypertension: Secondary | ICD-10-CM

## 2021-10-16 DIAGNOSIS — M25511 Pain in right shoulder: Secondary | ICD-10-CM | POA: Insufficient documentation

## 2021-10-16 DIAGNOSIS — Y92410 Unspecified street and highway as the place of occurrence of the external cause: Secondary | ICD-10-CM

## 2021-10-16 DIAGNOSIS — M549 Dorsalgia, unspecified: Secondary | ICD-10-CM | POA: Insufficient documentation

## 2021-10-16 DIAGNOSIS — M5031 Other cervical disc degeneration,  high cervical region: Secondary | ICD-10-CM | POA: Diagnosis not present

## 2021-10-16 DIAGNOSIS — F17210 Nicotine dependence, cigarettes, uncomplicated: Secondary | ICD-10-CM

## 2021-10-16 DIAGNOSIS — M25512 Pain in left shoulder: Secondary | ICD-10-CM

## 2021-10-16 DIAGNOSIS — M542 Cervicalgia: Secondary | ICD-10-CM

## 2021-10-16 DIAGNOSIS — M4802 Spinal stenosis, cervical region: Secondary | ICD-10-CM | POA: Diagnosis not present

## 2021-10-16 MED ORDER — METHOCARBAMOL 500 MG PO TABS: 500.0000 mg | ORAL_TABLET | Freq: Four times a day (QID) | ORAL | 0 refills | Status: DC | PRN
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
## 2021-10-18 ENCOUNTER — Encounter: Payer: Self-pay | Admitting: Student

## 2021-10-18 NOTE — Assessment & Plan Note (Addendum)
Left shoulder x-ray does not show any signs of arthritis, fracture or dislocation. Treating conservatively with muscle relaxer and OTC Tylenol or NSAIDs. We will consider referral to PT/OT if symptoms do not improve.
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
## 2021-10-18 NOTE — Assessment & Plan Note (Signed)
BP remained very well controlled with SBP in the 110s. Vitals:   10/16/21 1615  BP: 117/69  -Continue to monitor

## 2021-10-24 NOTE — Progress Notes (Signed)
Internal Medicine Clinic Attending  Case discussed with the resident at the time of the visit.  We reviewed the resident's history and exam and pertinent patient test results.  I agree with the assessment, diagnosis, and plan of care documented in the resident's note.  

## 2022-01-03 NOTE — Progress Notes (Deleted)
Error

## 2022-01-16 NOTE — Telephone Encounter (Signed)
Opened in error

## 2022-05-06 ENCOUNTER — Telehealth: Payer: Self-pay

## 2022-05-06 NOTE — Telephone Encounter (Signed)
Patient called office today requesting appointment for STD treatment. States that he had blood work done at orthopedics and showed he was positive for syphillis.  Is scheduled to come in on Thursday to discuss if treatment is needed. Will try to have results faxed to office before appointment or will bring copy of results. Leatrice Jewels, RMA

## 2022-05-08 ENCOUNTER — Other Ambulatory Visit: Payer: Self-pay

## 2022-05-08 ENCOUNTER — Encounter: Payer: Self-pay | Admitting: Internal Medicine

## 2022-05-08 ENCOUNTER — Ambulatory Visit (INDEPENDENT_AMBULATORY_CARE_PROVIDER_SITE_OTHER): Payer: Medicaid Other

## 2022-05-08 ENCOUNTER — Ambulatory Visit (INDEPENDENT_AMBULATORY_CARE_PROVIDER_SITE_OTHER): Payer: Medicaid Other | Admitting: Internal Medicine

## 2022-05-08 VITALS — BP 148/91 | HR 56 | Temp 97.5°F | Wt 173.0 lb

## 2022-05-08 DIAGNOSIS — R202 Paresthesia of skin: Secondary | ICD-10-CM

## 2022-05-08 DIAGNOSIS — A539 Syphilis, unspecified: Secondary | ICD-10-CM

## 2022-05-08 DIAGNOSIS — B20 Human immunodeficiency virus [HIV] disease: Secondary | ICD-10-CM

## 2022-05-08 DIAGNOSIS — R200 Anesthesia of skin: Secondary | ICD-10-CM | POA: Diagnosis not present

## 2022-05-08 DIAGNOSIS — Z23 Encounter for immunization: Secondary | ICD-10-CM | POA: Diagnosis present

## 2022-05-08 MED ORDER — BIKTARVY 50-200-25 MG PO TABS: 1.0000 | ORAL_TABLET | Freq: Every day | ORAL | 11 refills | Status: DC

## 2022-05-08 NOTE — Assessment & Plan Note (Signed)
His infection has been under excellent, long-term control.  He will get repeat lab work today and continue [employer].  He received his annual influenza vaccine and an updated COVID-vaccine here today.  He will follow-up in 1 year.

## 2022-05-08 NOTE — Assessment & Plan Note (Signed)
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
## 2022-05-08 NOTE — Assessment & Plan Note (Signed)
Jeremiah [name] was treated here for late latent syphilis in 2015 when his RPR was found to be elevated at 1:8.  His RPR decreased appropriately but has remained a low-level positive ever since indicating a "serofast" state.  The hyperpigmented calluses on his hands are quite chronic and are not a result of active syphilis.  He does not need further evaluation or retreatment for syphilis at this time.  I suspect that his RPR will remain positive for the rest of his life.

## 2022-05-09 LAB — T-HELPER CELLS (CD4) COUNT (NOT AT ARMC)
CD4 % Helper T Cell: 18 % — ABNORMAL LOW (ref 33–65)
CD4 T Cell Abs: 288 /uL — ABNORMAL LOW (ref 400–1790)

## 2022-05-13 LAB — HIV-1 RNA QUANT-NO REFLEX-BLD
HIV 1 RNA Quant: <20 Copies/mL — ABNORMAL HIGH
HIV-1 RNA Quant, Log: <1.3 Log cps/mL — ABNORMAL HIGH

## 2022-05-30 NOTE — Telephone Encounter (Signed)
Done

## 2022-09-24 NOTE — Telephone Encounter (Signed)
Jeremiah [name] you have to sign this note for it to be closed off

## 2023-01-14 ENCOUNTER — Ambulatory Visit (INDEPENDENT_AMBULATORY_CARE_PROVIDER_SITE_OTHER): Payer: MEDICAID | Admitting: Podiatry

## 2023-01-14 ENCOUNTER — Telehealth: Payer: Self-pay

## 2023-01-14 DIAGNOSIS — Z91199 Patient's noncompliance with other medical treatment and regimen due to unspecified reason: Secondary | ICD-10-CM
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
## 2023-01-14 NOTE — Progress Notes (Signed)
No show

## 2023-02-23 ENCOUNTER — Other Ambulatory Visit (HOSPITAL_COMMUNITY): Payer: Self-pay

## 2023-02-24 ENCOUNTER — Encounter: Payer: Self-pay | Admitting: Infectious Disease

## 2023-02-24 DIAGNOSIS — Z7185 Encounter for immunization safety counseling: Secondary | ICD-10-CM | POA: Insufficient documentation

## 2023-02-24 HISTORY — DX: Encounter for immunization safety counseling: Z71.85

## 2023-02-25 ENCOUNTER — Ambulatory Visit: Payer: Medicaid Other | Admitting: Infectious Disease

## 2023-02-25 ENCOUNTER — Other Ambulatory Visit (HOSPITAL_COMMUNITY): Payer: Self-pay

## 2023-02-25 ENCOUNTER — Other Ambulatory Visit: Payer: Self-pay | Admitting: Pharmacist

## 2023-02-25 DIAGNOSIS — C210 Malignant neoplasm of anus, unspecified: Secondary | ICD-10-CM

## 2023-02-25 DIAGNOSIS — B20 Human immunodeficiency virus [HIV] disease: Secondary | ICD-10-CM

## 2023-02-25 DIAGNOSIS — Z7185 Encounter for immunization safety counseling: Secondary | ICD-10-CM

## 2023-02-25 DIAGNOSIS — I10 Essential (primary) hypertension: Secondary | ICD-10-CM

## 2023-02-25 MED ORDER — BIKTARVY 50-200-25 MG PO TABS: 1.0000 | ORAL_TABLET | Freq: Every day | ORAL | 1 refills | Status: DC

## 2023-03-11 ENCOUNTER — Other Ambulatory Visit: Payer: Self-pay

## 2023-03-11 ENCOUNTER — Other Ambulatory Visit (HOSPITAL_COMMUNITY)
Admission: RE | Admit: 2023-03-11 | Discharge: 2023-03-11 | Disposition: A | Discharge status: Home / Self Care | Payer: MEDICAID | Source: Ambulatory Visit | Attending: Infectious Disease | Admitting: Infectious Disease

## 2023-03-11 ENCOUNTER — Encounter: Payer: Self-pay | Admitting: Infectious Disease

## 2023-03-11 ENCOUNTER — Ambulatory Visit (INDEPENDENT_AMBULATORY_CARE_PROVIDER_SITE_OTHER): Payer: No Typology Code available for payment source | Admitting: Infectious Disease

## 2023-03-11 VITALS — BP 111/74 | HR 63 | Temp 98.7°F | Ht 71.0 in | Wt 173.0 lb

## 2023-03-11 DIAGNOSIS — B20 Human immunodeficiency virus [HIV] disease: Secondary | ICD-10-CM | POA: Diagnosis not present

## 2023-03-11 DIAGNOSIS — I10 Essential (primary) hypertension: Secondary | ICD-10-CM | POA: Diagnosis not present

## 2023-03-11 DIAGNOSIS — Z122 Encounter for screening for malignant neoplasm of respiratory organs: Secondary | ICD-10-CM | POA: Insufficient documentation

## 2023-03-11 DIAGNOSIS — F411 Generalized anxiety disorder: Secondary | ICD-10-CM

## 2023-03-11 DIAGNOSIS — C210 Malignant neoplasm of anus, unspecified: Secondary | ICD-10-CM | POA: Insufficient documentation

## 2023-03-11 DIAGNOSIS — Z7185 Encounter for immunization safety counseling: Secondary | ICD-10-CM | POA: Insufficient documentation

## 2023-03-11 DIAGNOSIS — E785 Hyperlipidemia, unspecified: Secondary | ICD-10-CM | POA: Insufficient documentation

## 2023-03-11 DIAGNOSIS — G5603 Carpal tunnel syndrome, bilateral upper limbs: Secondary | ICD-10-CM

## 2023-03-11 DIAGNOSIS — A539 Syphilis, unspecified: Secondary | ICD-10-CM | POA: Insufficient documentation

## 2023-03-11 DIAGNOSIS — Z23 Encounter for immunization: Secondary | ICD-10-CM

## 2023-03-11 HISTORY — DX: Encounter for screening for malignant neoplasm of respiratory organs: Z12.2

## 2023-03-11 MED ORDER — AMLODIPINE BESYLATE 5 MG PO TABS: 5.0000 mg | ORAL_TABLET | Freq: Every day | ORAL | 11 refills | Status: AC

## 2023-03-11 MED ORDER — GABAPENTIN 800 MG PO TABS: 800.0000 mg | ORAL_TABLET | Freq: Three times a day (TID) | ORAL | 11 refills | Status: DC

## 2023-03-11 MED ORDER — DULOXETINE HCL 60 MG PO CPEP: 60.0000 mg | ORAL_CAPSULE | Freq: Every day | ORAL | 11 refills | Status: DC

## 2023-03-11 MED ORDER — BIKTARVY 50-200-25 MG PO TABS: 1.0000 | ORAL_TABLET | Freq: Every day | ORAL | 1 refills | Status: DC

## 2023-03-11 MED ORDER — ESCITALOPRAM OXALATE 5 MG PO TABS: 5.0000 mg | ORAL_TABLET | Freq: Every day | ORAL | 11 refills | Status: DC

## 2023-03-11 MED ORDER — ATORVASTATIN CALCIUM 20 MG PO TABS: 20.0000 mg | ORAL_TABLET | Freq: Every day | ORAL | 11 refills | Status: DC

## 2023-03-11 MED ORDER — VITAMIN D (ERGOCALCIFEROL) 1.25 MG (50000 UNIT) PO CAPS: 50000.0000 [IU] | ORAL_CAPSULE | ORAL | 11 refills | Status: AC

## 2023-03-11 NOTE — Progress Notes (Signed)
Subjective:  Chief complaint: Follow-up for HIV disease on medications  Patient ID: Jeremiah [name], male    DOB: Nov 07, 1970, 52 y.o.   MRN: [id number]  HPI   Discussed the use of AI scribe software for clinical note transcription with the patient, who gave verbal consent to proceed.  History of Present Illness   The patient, with a history of HIV, anal cancer, and carpal tunnel syndrome, presents for a routine follow-up. He has been on Biktarvy for HIV management, and his viral load has been consistently less than twenty, which is considered undetectable. The patient also takes various medications for other conditions, including vitamin D, Lexapro for anxiety, naproxen for hand pain, duloxetine for carpal tunnel syndrome, and gabapentin. However, he has been out of Lexapro for about a week and is experiencing anxiety. He also reports hand pain, which is not significantly relieved by naproxen. The patient has a history of lung issues which required resection       Past Medical History:  Diagnosis Date   Anal condyloma    Anxiety    Carpal tunnel syndrome    Right   COPD with emphysema ([facility])    last exacerbation 10-16-2020 documented in epic   Depression    Dyslipidemia    Hemorrhoids    History of anal intraepithelial neoplasia III 10/2015   History of chlamydia    hx multiple rescurrent chlamydia,  last episode 05-20-2021 with positive RPR   History of pneumothorax 06/29/2018   post procedure , VATS w/ wedge resection RUL mass 06-23-2018   HIV (human immunodeficiency virus infection) ([facility])    followed by dr Shela Commons. Orvan Falconer (ID)   Internal hemorrhoids    Latent syphilis    Phimosis    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[facility]) 01/06/2018   06/23/2018-surgical pathology report- lung wedge biopsy necrotizing granulomatous inflammation associated fibrosis, no evidence of malignancy   Rectal cancer ([facility]) 01/2017   followed by GI-- dr s. Tomasa Rand;; (previous oncologist in [place]-- dr Mosetta Putt);;    treated in [facility],  dx 10/ 2018 and completed chemoradiation 01/ 2019   Tick bite 08/08/2020   Tooth missing    per pt had two lower teeth and one molar extracted due to being broken 07-16-2021, pt stated there was no infection   Vaccine counseling 02/24/2023    Past Surgical History:  Procedure Laterality Date   CIRCUMCISION N/A 09/11/2021   Procedure: CIRCUMCISION ADULT;  Surgeon: Rene Paci, MD;  Location: [facility];  Service: Urology;  Laterality: N/A;   COLONOSCOPY WITH PROPOFOL  06/28/2021   by dr s. Tomasa Rand   IR RADIOLOGIST EVAL & MGMT  05/17/2019   IR RADIOLOGY PERIPHERAL GUIDED IV START  05/20/2019   IR TRANSCATH RETRIEVAL FB INCL GUIDANCE (MS)  05/20/2019   IR US GUIDE VASC ACCESS RIGHT  05/20/2019   LAPAROSCOPIC APPENDECTOMY N/A 01/11/2020   Procedure: APPENDECTOMY LAPAROSCOPIC;  Surgeon: Abigail Miyamoto, MD;  Location: [facility] OR;  Service: General;  Laterality: N/A;   LAPAROSCOPIC INGUINAL HERNIA REPAIR  1991   ORCHIECTOMY  2016   unilateral   PORT-A-CATH REMOVAL N/A 05/11/2019   Procedure: PARTIAL PORT REMOVAL;  Surgeon: Romie Levee, MD;  Location[facility];  Service: General;  Laterality: N/A;   VIDEO ASSISTED THORACOSCOPY (VATS)/WEDGE RESECTION Right 06/23/2018   Procedure: VIDEO ASSISTED THORACOSCOPY (VATS)/LUNG RESECTION, stapling and dissection of apical bleb, wedge resection of right upper lobe mass, lymph node dissection, intercostal nerve block;  Surgeon: Delight Ovens, MD;  Location: [facility] OR;  Service: Thoracic;  Laterality: Right;   VIDEO BRONCHOSCOPY N/A 06/23/2018   Procedure: VIDEO BRONCHOSCOPY;  Surgeon: Delight Ovens, MD;  Location: [facility] OR;  Service: Thoracic;  Laterality: N/A;    Family History  Problem Relation Age of Onset   Diabetes Mother    Hypertension Mother    Cancer Mother        breast   Diabetes Father    Hypertension Father    Colon cancer Neg Hx    Colon polyps Neg Hx    Esophageal cancer  Neg Hx    Stomach cancer Neg Hx    Rectal cancer Neg Hx       Social History   Socioeconomic History   Marital status: Single    Spouse name: Not on file   Number of children: Not on file   Years of education: Not on file   Highest education level: Not on file  Occupational History   Not on file  Tobacco Use   Smoking status: Every Day    Types: Cigars   Smokeless tobacco: Never   Tobacco comments:    6 cigars a day-per pt 07-17-2021  Vaping Use   Vaping status: Never Used  Substance and Sexual Activity   Alcohol use: Not Currently    Comment: 1.5 pt liquior day  , none since 09/ 2022   Drug use: Not Currently    Comment: Past history of crack cocaine use ,  last time 09/ 2022   Sexual activity: Not Currently    Partners: Male    Birth control/protection: Condom    Comment: declined condoms  Other Topics Concern   Not on file  Social History Narrative   Not on file   Social Determinants of Health   Financial Resource Strain: Low Risk  (11/21/2022)   Received from [facility]   Overall Financial Resource Strain (CARDIA)    Difficulty of Paying Living Expenses: Not hard at all  Food Insecurity: No Food Insecurity (11/21/2022)   [facility] from [facility]   Hunger Vital Sign    Worried About Running Out of Food in the Last Year: Never true    Ran Out of Food in the Last Year: Never true  Transportation Needs: No Transportation Needs (11/21/2022)   Received from [facility] - [facility]    Lack of Transportation (Medical): No    Lack of Transportation (Non-Medical): No  Physical Activity: Not on file  Stress: Not on file  Social Connections: Unknown (06/27/2022)   [facility] from [facility]   Social Network    Social Network: Not on file    Allergies  Allergen Reactions   Nsaids Other (See Comments)    DRUG INTERACTION WITH HIV MEDS   Sulfa Antibiotics Itching     Current Outpatient Medications:    albuterol (VENTOLIN HFA)  108 (90 Base) MCG/ACT inhaler, Inhale 1-2 puffs into the lungs every 6 (six) hours as needed for wheezing or shortness of breath., Disp: 6.7 g, Rfl: 12   bictegravir-emtricitabine-tenofovir AF (BIKTARVY) 50-200-25 MG TABS tablet, Take 1 tablet by mouth daily., Disp: 30 tablet, Rfl: 1   ciclopirox (PENLAC) 8 % solution, Apply topically at bedtime. Apply over nail and surrounding skin. Apply daily over previous coat. After seven (7) days, may remove with alcohol and continue cycle. (Patient not taking: Reported on 05/08/2022), Disp: 6.6 mL, Rfl: 0   methocarbamol (ROBAXIN) 500 MG tablet, Take 1 tablet (500 mg total) by mouth every  6 (six) hours as needed for muscle spasms. (Patient not taking: Reported on 05/08/2022), Disp: 40 tablet, Rfl: 0   mometasone-formoterol (DULERA) 200-5 MCG/ACT AERO, Inhale 2 puffs into the lungs in the morning and at bedtime., Disp: 13 g, Rfl: 5   naproxen (NAPROSYN) 500 MG tablet, , Disp: , Rfl:    Review of Systems  Constitutional:  Negative for activity change, appetite change, chills, diaphoresis, fatigue, fever and unexpected weight change.  HENT:  Negative for congestion, rhinorrhea, sinus pressure, sneezing, sore throat and trouble swallowing.   Eyes:  Negative for photophobia and visual disturbance.  Respiratory:  Negative for cough, chest tightness, shortness of breath, wheezing and stridor.   Cardiovascular:  Negative for chest pain, palpitations and leg swelling.  Gastrointestinal:  Negative for abdominal distention, abdominal pain, anal bleeding, blood in stool, constipation, diarrhea, nausea and vomiting.  Genitourinary:  Negative for difficulty urinating, dysuria, flank pain and hematuria.  Musculoskeletal:  Positive for arthralgias. Negative for back pain, gait problem, joint swelling and myalgias.  Skin:  Negative for color change, pallor, rash and wound.  Neurological:  Negative for dizziness, tremors, weakness and light-headedness.  Hematological:   Negative for adenopathy. Does not bruise/bleed easily.  Psychiatric/Behavioral:  Negative for agitation, behavioral problems, confusion, decreased concentration, dysphoric mood and sleep disturbance.        Objective:   Physical Exam Constitutional:      Appearance: He is well-developed.  HENT:     Head: Normocephalic and atraumatic.  Eyes:     Conjunctiva/sclera: Conjunctivae normal.  Cardiovascular:     Rate and Rhythm: Normal rate and regular rhythm.  Pulmonary:     Effort: Pulmonary effort is normal. No respiratory distress.     Breath sounds: No wheezing.  Abdominal:     General: There is no distension.     Palpations: Abdomen is soft.  Musculoskeletal:        General: No tenderness. Normal range of motion.     Cervical back: Normal range of motion and neck supple.  Skin:    General: Skin is warm and dry.     Coloration: Skin is not pale.     Findings: No erythema or rash.  Neurological:     General: No focal deficit present.     Mental Status: He is alert and oriented to person, place, and time.  Psychiatric:        Mood and Affect: Mood normal.        Behavior: Behavior normal.        Thought Content: Thought content normal.        Judgment: Judgment normal.           Assessment & Plan:   Assessment and Plan    HIV Stable on Biktarvy with undetectable viral load (<20) for several years. CD4 count was 288 in January. -Continue Biktarvy. -Check labs for viral load and CD4 count.  Anxiety Reports being out of Lexapro for a week. -Refill Lexapro prescription.--NOTE he has not been taking this with cymbalta and does nto want to take the cymbalta anymore as he says he really was not taking it regularly  Hyperlipidemia Discussed the benefits of statin therapy in reducing cardiovascular events in HIV patients. -Start lipitor  Carpal Tunnel Syndrome Reports hand pain, attempted nerve conduction test but was intolerable. -Consider wearing a brace at  night. -Follow-up with specialist for alternative testing and potential surgical options.  General Health Maintenance -Administer flu and COVID-19 vaccines today.  HTN: continue  amlodipine  -Refill other medications including Vitamin D,  Gabapentin, Amlodipine, and Naproxen. Trial off of  Naproxen due to potential long-term kidney effects. -Consider re-establishing primary care with internal medicine at [facility] clinic.     GAD continue lexapro but not cymbalta due to concerns for serotonin syndrome risk  I have personally spent 44  minutes involved in face-to-face and non-face-to-face activities for this patient on the day of the visit. Professional time spent includes the following activities: Preparing to see the patient (review of tests), Obtaining and/or reviewing separately obtained history (admission/discharge record), Performing a medically appropriate examination and/or evaluation , Ordering medications/tests/procedures, referring and communicating with other health care professionals, Documenting clinical information in the EMR, Independently interpreting results (not separately reported), Communicating results to the patient/family/caregiver, Counseling and educating the patient/family/caregiver and Care coordination (not separately reported).

## 2023-03-12 LAB — T-HELPER CELLS (CD4) COUNT (NOT AT ARMC)
CD4 % Helper T Cell: 22 % — ABNORMAL LOW (ref 33–65)
CD4 T Cell Abs: 355 /uL — ABNORMAL LOW (ref 400–1790)

## 2023-03-12 LAB — CYTOLOGY, (ORAL, ANAL, URETHRAL) ANCILLARY ONLY
Chlamydia: NEGATIVE
Chlamydia: NEGATIVE
Comment: NEGATIVE
Comment: NEGATIVE
Comment: NORMAL
Comment: NORMAL
Neisseria Gonorrhea: NEGATIVE
Neisseria Gonorrhea: NEGATIVE

## 2023-03-12 LAB — URINE CYTOLOGY ANCILLARY ONLY
Chlamydia: NEGATIVE
Comment: NEGATIVE
Comment: NORMAL
Neisseria Gonorrhea: NEGATIVE

## 2023-03-14 LAB — COMPLETE METABOLIC PANEL WITH GFR
AG Ratio: 2.4 (calc) (ref 1.0–2.5)
ALT: 19 U/L (ref 9–46)
AST: 41 U/L — ABNORMAL HIGH (ref 10–35)
Albumin: 4.4 g/dL (ref 3.6–5.1)
Alkaline phosphatase (APISO): 43 U/L (ref 35–144)
BUN/Creatinine Ratio: 10 (calc) (ref 6–22)
BUN: 14 mg/dL (ref 7–25)
CO2: 27 mmol/L (ref 20–32)
Calcium: 9 mg/dL (ref 8.6–10.3)
Chloride: 107 mmol/L (ref 98–110)
Creat: 1.37 mg/dL — ABNORMAL HIGH (ref 0.70–1.30)
Globulin: 1.8 g/dL — ABNORMAL LOW (ref 1.9–3.7)
Glucose, Bld: 73 mg/dL (ref 65–99)
Potassium: 4.1 mmol/L (ref 3.5–5.3)
Sodium: 140 mmol/L (ref 135–146)
Total Bilirubin: 1.3 mg/dL — ABNORMAL HIGH (ref 0.2–1.2)
Total Protein: 6.2 g/dL (ref 6.1–8.1)
eGFR: 62 mL/min/{1.73_m2} (ref >=60)

## 2023-03-14 LAB — CBC WITH DIFFERENTIAL/PLATELET
Absolute Lymphocytes: 1672 {cells}/uL (ref 850–3900)
Absolute Monocytes: 441 {cells}/uL (ref 200–950)
Basophils Absolute: 21 {cells}/uL (ref 0–200)
Basophils Relative: 0.5 %
Eosinophils Absolute: 151 {cells}/uL (ref 15–500)
Eosinophils Relative: 3.6 %
HCT: 38.9 % (ref 38.5–50.0)
Hemoglobin: 13.8 g/dL (ref 13.2–17.1)
MCH: 37.2 pg — ABNORMAL HIGH (ref 27.0–33.0)
MCHC: 35.5 g/dL (ref 32.0–36.0)
MCV: 104.9 fL — ABNORMAL HIGH (ref 80.0–100.0)
MPV: 10.4 fL (ref 7.5–12.5)
Monocytes Relative: 10.5 %
Neutro Abs: 1915 {cells}/uL (ref 1500–7800)
Neutrophils Relative %: 45.6 %
Platelets: 144 10*3/uL (ref 140–400)
RBC: 3.71 10*6/uL — ABNORMAL LOW (ref 4.20–5.80)
RDW: 13 % (ref 11.0–15.0)
Total Lymphocyte: 39.8 %
WBC: 4.2 10*3/uL (ref 3.8–10.8)

## 2023-03-14 LAB — RPR TITER: RPR Titer: 1:1 {titer} — ABNORMAL HIGH

## 2023-03-14 LAB — LIPID PANEL
Cholesterol: 166 mg/dL (ref <200)
HDL: 48 mg/dL (ref >=40)
LDL Cholesterol (Calc): 87 mg/dL
Non-HDL Cholesterol (Calc): 118 mg/dL (ref <130)
Total CHOL/HDL Ratio: 3.5 (calc) (ref <5.0)
Triglycerides: 215 mg/dL — ABNORMAL HIGH (ref <150)

## 2023-03-14 LAB — T PALLIDUM AB: T Pallidum Abs: POSITIVE — AB

## 2023-03-14 LAB — HIV-1 RNA QUANT-NO REFLEX-BLD
HIV 1 RNA Quant: NOT DETECTED {copies}/mL
HIV-1 RNA Quant, Log: NOT DETECTED {Log_copies}/mL

## 2023-03-14 LAB — RPR: RPR Ser Ql: REACTIVE — AB

## 2023-03-24 NOTE — Telephone Encounter (Signed)
Done

## 2023-03-27 ENCOUNTER — Telehealth: Payer: Self-pay

## 2023-03-27 NOTE — Telephone Encounter (Signed)
Patient called to request copy of all medical records. Has not completed medical release. States that he would like copy for his records. Informed patient that he must complete medical release before records can be provided. Emailed copy of release. Advised he either drop completed form at office or email back.  Verbalized understanding. Confirmed patient is not transferring care. Juanita Laster, RMA

## 2023-05-08 ENCOUNTER — Telehealth: Payer: Self-pay | Admitting: Pulmonary Disease

## 2023-05-08 NOTE — Telephone Encounter (Signed)
Patient states needs refill for Albuterol. Patient scheduled 06/17/2023 with Ames Dura NP. Pharmacy is [facility] [address] Patient phone number is [phone].

## 2023-05-15 MED ORDER — ALBUTEROL SULFATE HFA 108 (90 BASE) MCG/ACT IN AERS: 1.0000 | INHALATION_SPRAY | Freq: Four times a day (QID) | RESPIRATORY_TRACT | 12 refills | Status: DC | PRN

## 2023-05-15 NOTE — Telephone Encounter (Signed)
-  Albuterol refilled.

## 2023-06-11 ENCOUNTER — Telehealth: Payer: Self-pay

## 2023-06-11 NOTE — Telephone Encounter (Signed)
 Patient called wanting to know if he can take a supplement for stamina. He is unsure of the name of the supplement or its ingredients. Asked him to please look up the supplement or take a picture of it and send it through MyChart for pharmacy team to review prior to starting any new supplements.   Sandie Ano, RN

## 2023-06-11 NOTE — Telephone Encounter (Signed)
 Patient called back with supplement information. States that he would like to try total beets blood pressure supplement for stamina, but per pharmacy team advised against this due to drug-drug interaction.  Patient would like to ask MD if he has any recommendations for over the counter supplements to help with stamina. Did not want to ask PCP when advised.  Juanita Laster, RMA

## 2023-06-17 ENCOUNTER — Ambulatory Visit: Payer: MEDICAID | Admitting: Primary Care

## 2023-06-19 ENCOUNTER — Other Ambulatory Visit: Payer: Self-pay

## 2023-06-19 DIAGNOSIS — B20 Human immunodeficiency virus [HIV] disease: Secondary | ICD-10-CM

## 2023-06-19 MED ORDER — BIKTARVY 50-200-25 MG PO TABS: 1.0000 | ORAL_TABLET | Freq: Every day | ORAL | 5 refills | Status: DC

## 2023-07-24 ENCOUNTER — Ambulatory Visit: Payer: MEDICAID | Admitting: Primary Care

## 2023-07-24 ENCOUNTER — Encounter: Payer: Self-pay | Admitting: Primary Care

## 2023-07-24 VITALS — BP 126/82 | HR 61 | Temp 97.1°F | Ht 71.0 in | Wt 172.4 lb

## 2023-07-24 DIAGNOSIS — J439 Emphysema, unspecified: Secondary | ICD-10-CM

## 2023-07-24 DIAGNOSIS — R911 Solitary pulmonary nodule: Secondary | ICD-10-CM

## 2023-07-24 DIAGNOSIS — F17290 Nicotine dependence, other tobacco product, uncomplicated: Secondary | ICD-10-CM

## 2023-07-24 MED ORDER — VARENICLINE TARTRATE (STARTER) 0.5 MG X 11 & 1 MG X 42 PO TBPK: ORAL_TABLET | ORAL | 0 refills | Status: AC

## 2023-07-24 MED ORDER — MOMETASONE FURO-FORMOTEROL FUM 200-5 MCG/ACT IN AERO: 2.0000 | INHALATION_SPRAY | Freq: Two times a day (BID) | RESPIRATORY_TRACT | 11 refills | Status: AC

## 2023-07-24 NOTE — Patient Instructions (Addendum)
-  EMPHYSEMA: Emphysema is a chronic lung condition that causes shortness of breath. It is often related to smoking. We will restart your [facility] inhaler, which you should use two puffs in the morning and at bedtime. We also discussed the importance of quitting smoking and offered you Chantix to help with this.  -SMOKING CESSATION: Quitting smoking is crucial for improving your lung health. You expressed interest in using Chantix to help you quit. We discussed gradually reducing your smoking while using Chantix.  Begin CHANTIX dosing one week before the date set by the patient to stop smoking Starting Week: 0.5 mg once daily on days 1-3 and 0.5 mg twice daily on days 4-7 Continuing Weeks: 1 mg twice daily for a total of 12 weeks (3 months)   -LUNG NODULE: A lung nodule is a small growth in the lung. Your previous nodule was a granuloma, and we are continuing to monitor a new nodule with CT scans. We will order a follow-up CT scan and enroll you in a lung cancer screening program for annual scans.  -DIARRHEA: You have been experiencing diarrhea, which may be related to your diet. We recommend trying over-the-counter Imodium and following the BRAT diet (bananas, rice, applesauce, toast) to help manage your symptoms.  -ALLERGIC RHINITIS: Allergic rhinitis is an allergic reaction that causes sneezing, congestion, and a runny nose. We recommend using over-the-counter Zyrtec to manage your symptoms.  INSTRUCTIONS:  Please schedule a follow-up appointment in three months to assess your progress with smoking cessation and to review the results of your CT scan.  Follow-up 3 months or sooner if needed

## 2023-07-24 NOTE — Progress Notes (Addendum)
 @Patient  ID: Jeremiah [name], male    DOB: 1970/08/05, 53 y.o.   MRN: [id number]  Chief Complaint  Patient presents with   Follow-up    F/U on Pulm. Emphysema and Lung Nodule     Referring provider: Coye Diver, MD  HPI: 53 year old male, current every day smoker. PMH significant for HTN, eczema, pulmonary granuloma, lung nodule, allergic rhinitis, anal cancer, HIV.  Patient of Dr. Waylan Haggard.   07/24/2023 Discussed the use of AI scribe software for clinical note transcription with the patient, who gave verbal consent to proceed.  History of Present Illness   Jeremiah [name] is a 53 year old male with emphysema and a lung nodule who presents for follow-up of his pulmonary conditions.  He has a history of emphysema and a lung nodule. In March 2020, he underwent a lung resection for a nodule, which was found to be a granuloma. Postoperatively, a chest tube was placed due to a hydroneumothorax. The last CT scan in 2021 showed a mildly decreased 6 mm right middle lobe nodule with suspected radiation changes.  He uses an albuterol  inhaler three to four days a week and has been off Dulera for about a year, leading to increased symptoms such as cough and wheezing. His breathing sometimes interferes with his work.  He has a smoking history of approximately 33 years, currently smoking about seven cigars a day.  He experiences diarrhea recently, which he attributes to possible dietary causes. He has not used any specific treatment for this. No suicidal thoughts or tendencies.     Allergies  Allergen Reactions   Nsaids Other (See Comments)    DRUG INTERACTION WITH HIV MEDS   Sulfa  Antibiotics Itching    Immunization History  Administered Date(s) Administered   Hepatitis A, Adult 10/25/2013, 05/11/2014   Influenza, Seasonal, Injecte, Preservative Fre 03/11/2023   Influenza,inj,Quad PF,6+ Mos 05/06/2013, 05/11/2014, 01/02/2015, 03/31/2016, 02/23/2017, 01/06/2018, 02/21/2019, 02/14/2020,  05/08/2022   Influenza-Unspecified 12/31/2020   PFIZER(Purple Top)SARS-COV-2 Vaccination 07/18/2019, 08/08/2019, 02/14/2020   PNEUMOCOCCAL CONJUGATE-20 10/24/2020   PPD Test 05/06/2013   Pfizer Covid-19 Vaccine Bivalent Booster 67yrs & up 02/06/2021   Pfizer(Comirnaty)Fall Seasonal Vaccine 12 years and older 05/08/2022, 03/11/2023   Pneumococcal Conjugate-13 09/29/2018   Pneumococcal Polysaccharide-23 05/03/2013, 07/17/2016   Tdap 05/20/2019    Past Medical History:  Diagnosis Date   Anal condyloma    Anxiety    Carpal tunnel syndrome    Right   COPD with emphysema (HCC)    last exacerbation 10-16-2020 documented in epic   Depression    Dyslipidemia    Hemorrhoids    History of anal intraepithelial neoplasia III 10/2015   History of chlamydia    hx multiple rescurrent chlamydia,  last episode 05-20-2021 with positive RPR   History of pneumothorax 06/29/2018   post procedure , VATS w/ wedge resection RUL mass 06-23-2018   HIV (human immunodeficiency virus infection) (HCC)    followed by dr Kirk Peper. Daina Drum ([place])   Internal hemorrhoids    Latent syphilis    Phimosis    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HCC) 01/06/2018   06/23/2018-surgical pathology report- lung wedge biopsy necrotizing granulomatous inflammation associated fibrosis, no evidence of malignancy   Rectal cancer (HCC) 01/2017   followed by GI-- dr s. Cherryl Corona;; (previous oncologist in [place]-- dr Maryalice Smaller);;   treated in [facility],  dx 10/ 2018 and completed chemoradiation 01/ 2019   Screening for lung cancer 03/11/2023   Tick bite 08/08/2020   Tooth missing  per pt had two lower teeth and one molar extracted due to being broken 07-16-2021, pt stated there was no infection   Vaccine counseling 02/24/2023    Tobacco History: Social History   Tobacco Use  Smoking Status Every Day   Types: Cigars  Smokeless Tobacco Never  Tobacco Comments   6 cigars a day-per pt 07-17-2021   Ready to quit: Not Answered Counseling  given: Not Answered Tobacco comments: 6 cigars a day-per pt 07-17-2021   Outpatient Medications Prior to Visit  Medication Sig Dispense Refill   albuterol  (VENTOLIN  HFA) 108 (90 Base) MCG/ACT inhaler Inhale 1-2 puffs into the lungs every 6 (six) hours as needed for wheezing or shortness of breath. 6.7 g 12   amLODipine  (NORVASC ) 5 MG tablet Take 1 tablet (5 mg total) by mouth daily. 30 tablet 11   atorvastatin  (LIPITOR) 20 MG tablet Take 1 tablet (20 mg total) by mouth daily. 30 tablet 11   bictegravir-emtricitabine -tenofovir  AF (BIKTARVY ) 50-200-25 MG TABS tablet Take 1 tablet by mouth daily. 30 tablet 5   ciclopirox  (PENLAC ) 8 % solution Apply topically at bedtime. Apply over nail and surrounding skin. Apply daily over previous coat. After seven (7) days, may remove with alcohol and continue cycle. 6.6 mL 0   escitalopram  (LEXAPRO ) 5 MG tablet Take 1 tablet (5 mg total) by mouth daily. 30 tablet 11   gabapentin  (NEURONTIN ) 800 MG tablet Take by mouth.     gabapentin  (NEURONTIN ) 800 MG tablet Take 1 tablet (800 mg total) by mouth 3 (three) times daily. 90 tablet 11   mometasone -formoterol  (DULERA) 200-5 MCG/ACT AERO Inhale 2 puffs into the lungs in the morning and at bedtime. 13 g 5   Vitamin D , Ergocalciferol , (DRISDOL ) 1.25 MG (50000 UNIT) CAPS capsule Take 1 capsule (50,000 Units total) by mouth every 7 (seven) days. 5 capsule 11   methocarbamol  (ROBAXIN ) 500 MG tablet Take 1 tablet (500 mg total) by mouth every 6 (six) hours as needed for muscle spasms. (Patient not taking: Reported on 07/24/2023) 40 tablet 0   No facility-administered medications prior to visit.   Review of Systems  Review of Systems  Constitutional: Negative.   HENT: Negative.    Respiratory:  Positive for cough and wheezing.   Cardiovascular: Negative.    Physical Exam  BP 126/82 (BP Location: Right Arm, Patient Position: Sitting, Cuff Size: Large)   Pulse 61   Temp (!) 97.1 F (36.2 C) (Temporal)   Ht 5\' 11"   (1.803 m)   Wt 172 lb 6.4 oz (78.2 kg)   SpO2 97%   BMI 24.04 kg/m  Physical Exam Constitutional:      General: He is not in acute distress.    Appearance: Normal appearance. He is not ill-appearing.  HENT:     Head: Normocephalic and atraumatic.     Mouth/Throat:     Mouth: Mucous membranes are moist.     Pharynx: Oropharynx is clear.  Cardiovascular:     Rate and Rhythm: Normal rate and regular rhythm.  Pulmonary:     Effort: Pulmonary effort is normal.     Breath sounds: Normal breath sounds. No wheezing.  Skin:    General: Skin is warm and dry.  Neurological:     General: No focal deficit present.     Mental Status: He is alert and oriented to person, place, and time. Mental status is at baseline.  Psychiatric:        Mood and Affect: Mood normal.  Behavior: Behavior normal.        Thought Content: Thought content normal.        Judgment: Judgment normal.      Lab Results:  CBC    Component Value Date/Time   WBC 4.2 03/11/2023 1417   RBC 3.71 (L) 03/11/2023 1417   HGB 13.8 03/11/2023 1417   HGB 14.7 11/14/2019 1021   HCT 38.9 03/11/2023 1417   HCT 39.9 11/14/2019 1021   PLT 144 03/11/2023 1417   PLT 153 11/14/2019 1021   MCV 104.9 (H) 03/11/2023 1417   MCV 104 (H) 11/14/2019 1021   MCH 37.2 (H) 03/11/2023 1417   MCHC 35.5 03/11/2023 1417   RDW 13.0 03/11/2023 1417   RDW 12.8 11/14/2019 1021   LYMPHSABS 1.4 01/10/2020 0223   LYMPHSABS 1.1 11/14/2019 1021   MONOABS 0.5 01/10/2020 0223   EOSABS 151 03/11/2023 1417   EOSABS 0.1 11/14/2019 1021   BASOSABS 21 03/11/2023 1417   BASOSABS 0.0 11/14/2019 1021    BMET    Component Value Date/Time   NA 140 03/11/2023 1417   NA 139 03/24/2019 1328   K 4.1 03/11/2023 1417   CL 107 03/11/2023 1417   CO2 27 03/11/2023 1417   GLUCOSE 73 03/11/2023 1417   BUN 14 03/11/2023 1417   BUN 17 03/24/2019 1328   CREATININE 1.37 (H) 03/11/2023 1417   CALCIUM  9.0 03/11/2023 1417   GFRNONAA >60 01/12/2020 0222    GFRNONAA 63 06/20/2019 1026   GFRAA >60 01/12/2020 0222   GFRAA 73 06/20/2019 1026    BNP No results found for: "BNP"  ProBNP No results found for: "PROBNP"  Imaging: No results found.   Assessment & Plan:   1. Lung nodule (Primary) - CT Chest Wo Contrast; Future  2. Pulmonary emphysema, unspecified emphysema type (HCC) - CT Chest Wo Contrast; Future  Assessment and Plan    Emphysema Chronic emphysema exacerbated by cessation of Dulera. Smoking history contributes to condition. Albuterol  used 3-4 times weekly. - Re-prescribe Dulera inhaler, 200 micrograms, two puffs morning and bedtime. - Discussed smoking cessation importance and offered Chantix .  Smoking cessation Not ready to quit but open to pharmacological aids. Interested in Chantix . No depression or suicidal thoughts history. - Prescribe Chantix . - Discussed gradual smoking reduction while using Chantix . - Smoking cessation reviewed 3-10 mins   Lung nodule Current smoker. Previous granuloma resection. Spiculated opacity and 6 mm nodule noted on CT. High risk warrants continued monitoring.  - Order CT scan for right middle lobe nodule follow-up. - Enroll in lung cancer screening program for annual CT scans in the future   Diarrhea Recent diarrhea possibly due to diet or environment. - Recommend over-the-counter Imodium. - Advise BRAT diet.  Allergic rhinitis Active symptoms managed with over-the-counter options. - Recommend over-the-counter Zyrtec .  Follow-up Necessary to monitor treatment response and condition management. - Schedule follow-up in three months to assess smoking cessation and review CT results.     Jeremiah Baumgarten, NP 07/24/2023

## 2023-08-07 ENCOUNTER — Other Ambulatory Visit

## 2023-08-18 ENCOUNTER — Telehealth: Payer: Self-pay

## 2023-08-18 NOTE — Telephone Encounter (Signed)
 Attempted to contact patient - no voicemail set up to leave a message.   Jeremiah [name] [name], CMA

## 2023-08-18 NOTE — Telephone Encounter (Signed)
 Patient called stating that he has been having racing heart and ringing in ears x 1 week. Patient thinks it is side effects of Atorvastatin . Patient has taken medication x 3-4 months. Was out of medication for 3-4 days and then started it back and now experiencing symptoms. Patient denied taking any new medications. Patient has now stopped taking Atorvastatin  and wanted to inform provider.    Genieve Ramaswamy [name], CMA

## 2023-08-18 NOTE — Telephone Encounter (Signed)
 Patient aware.

## 2023-08-21 ENCOUNTER — Other Ambulatory Visit

## 2023-08-24 ENCOUNTER — Other Ambulatory Visit

## 2023-09-01 NOTE — Progress Notes (Signed)
 The 10-year ASCVD risk score (Arnett DK, et al., 2019) is: 15%   Values used to calculate the score:     Age: 53 years     Sex: Male     Is Non-Hispanic African American: Yes     Diabetic: No     Tobacco smoker: Yes     Systolic Blood Pressure: 126 mmHg     Is BP treated: Yes     HDL Cholesterol: 48 mg/dL     Total Cholesterol: 166 mg/dL  Arlon Bergamo, BSN, RN

## 2023-09-02 ENCOUNTER — Ambulatory Visit
Admission: RE | Admit: 2023-09-02 | Discharge: 2023-09-02 | Discharge status: Home / Self Care | Payer: MEDICAID | Source: Ambulatory Visit | Attending: Primary Care

## 2023-09-02 DIAGNOSIS — J439 Emphysema, unspecified: Secondary | ICD-10-CM

## 2023-09-02 DIAGNOSIS — R911 Solitary pulmonary nodule: Secondary | ICD-10-CM

## 2023-09-03 ENCOUNTER — Other Ambulatory Visit

## 2023-09-29 ENCOUNTER — Encounter: Payer: Self-pay | Admitting: Primary Care

## 2023-09-30 ENCOUNTER — Ambulatory Visit: Payer: Self-pay | Admitting: Primary Care

## 2023-09-30 ENCOUNTER — Telehealth: Payer: Self-pay

## 2023-09-30 MED ORDER — ALBUTEROL SULFATE HFA 108 (90 BASE) MCG/ACT IN AERS: 1.0000 | INHALATION_SPRAY | Freq: Four times a day (QID) | RESPIRATORY_TRACT | 2 refills | Status: AC | PRN

## 2023-09-30 NOTE — Progress Notes (Signed)
Spoke with pt and notified of results per Beth.  Pt verbalized understanding and denied any questions. 

## 2023-09-30 NOTE — Telephone Encounter (Signed)
 Jeremiah Baumgarten, NP 09/30/2023  9:52 AM EDT     Right middle lobe lung nodule is stable, has not grown or changes in size in 4 year and is considered benign or not cancerous. No new nodule. He has emphysema which is related to smoking.    I called and spoke with the pt and notified of results per [facility] Pt verbalized understanding. Nothing further needed.

## 2023-09-30 NOTE — Progress Notes (Signed)
 Right middle lobe lung nodule is stable, has not grown or changes in size in 4 year and is considered benign or not cancerous. No new nodule. He has emphysema which is related to smoking.

## 2023-09-30 NOTE — Telephone Encounter (Signed)
 Sent result note to Jeremiah [name]

## 2023-09-30 NOTE — Telephone Encounter (Signed)
 Copied from CRM [id number]. Topic: Clinical - Lab/Test Results >> Sep 29, 2023  8:13 AM Juliana Ocean wrote: Reason for CRM: pt would like someone to call him and go over his CT results.  He does not understand what it all means   Please advise resulting ct scan

## 2023-10-29 ENCOUNTER — Other Ambulatory Visit: Payer: Self-pay | Admitting: Infectious Disease

## 2023-10-29 NOTE — Telephone Encounter (Signed)
 Patient has established with PCP Lang Hurl, PA.   Saga Balthazar, BSN, RN

## 2023-10-29 NOTE — Progress Notes (Deleted)
 @Patient  ID: Jeremiah [name], male    DOB: 1970/10/13, 53 y.o.   MRN: [id number]  No chief complaint on file.   Referring provider: Voncile Wenona SAILOR, MD  HPI:  HPI: 53 year old male, current every day smoker. PMH significant for HTN, eczema, pulmonary granuloma, lung nodule, allergic rhinitis, anal cancer, HIV.  Patient of Dr. Theophilus.   07/24/2023 Discussed the use of AI scribe software for clinical note transcription with the patient, who gave verbal consent to proceed.  History of Present Illness   Jeremiah [name] is a 53 year old male with emphysema and a lung nodule who presents for follow-up of his pulmonary conditions.  He has a history of emphysema and a lung nodule. In March 2020, he underwent a lung resection for a nodule, which was found to be a granuloma. Postoperatively, a chest tube was placed due to a hydroneumothorax. The last CT scan in 2021 showed a mildly decreased 6 mm right middle lobe nodule with suspected radiation changes.  He uses an albuterol  inhaler three to four days a week and has been off Dulera for about a year, leading to increased symptoms such as cough and wheezing. His breathing sometimes interferes with his work.  He has a smoking history of approximately 33 years, currently smoking about seven cigars a day.  He experiences diarrhea recently, which he attributes to possible dietary causes. He has not used any specific treatment for this. No suicidal thoughts or tendencies.    Assessment and Plan    Emphysema Chronic emphysema exacerbated by cessation of Dulera. Smoking history contributes to condition. Albuterol  used 3-4 times weekly. - Re-prescribe Dulera inhaler, 200 micrograms, two puffs morning and bedtime. - Discussed smoking cessation importance and offered Chantix .  Smoking cessation Not ready to quit but open to pharmacological aids. Interested in Chantix . No depression or suicidal thoughts history. - Prescribe Chantix . - Discussed  gradual smoking reduction while using Chantix . - Smoking cessation reviewed 3-10 mins   Lung nodule Current smoker. Previous granuloma resection. Spiculated opacity and 6 mm nodule noted on CT. High risk warrants continued monitoring.  - Order CT scan for right middle lobe nodule follow-up. - Enroll in lung cancer screening program for annual CT scans in the future   Diarrhea Recent diarrhea possibly due to diet or environment. - Recommend over-the-counter Imodium. - Advise BRAT diet.  Allergic rhinitis Active symptoms managed with over-the-counter options. - Recommend over-the-counter Zyrtec .  Follow-up Necessary to monitor treatment response and condition management. - Schedule follow-up in three months to assess smoking cessation and review CT results.    10/30/2023 3 month follow-up emphysema, lung nodules, smoking cessation     Allergies  Allergen Reactions   Nsaids Other (See Comments)    DRUG INTERACTION WITH HIV MEDS   Sulfa  Antibiotics Itching    Immunization History  Administered Date(s) Administered   Hepatitis A, Adult 10/25/2013, 05/11/2014   Influenza, Seasonal, Injecte, Preservative Fre 03/11/2023   Influenza,inj,Quad PF,6+ Mos 05/06/2013, 05/11/2014, 01/02/2015, 03/31/2016, 02/23/2017, 01/06/2018, 02/21/2019, 02/14/2020, 05/08/2022   Influenza-Unspecified 12/31/2020   PFIZER(Purple Top)SARS-COV-2 Vaccination 07/18/2019, 08/08/2019, 02/14/2020   PNEUMOCOCCAL CONJUGATE-20 10/24/2020   PPD Test 05/06/2013   Pfizer Covid-19 Vaccine Bivalent Booster 54yrs & up 02/06/2021   Pfizer(Comirnaty)Fall Seasonal Vaccine 12 years and older 05/08/2022, 03/11/2023   Pneumococcal Conjugate-13 09/29/2018   Pneumococcal Polysaccharide-23 05/03/2013, 07/17/2016   Tdap 05/20/2019    Past Medical History:  Diagnosis Date   Anal condyloma    Anxiety    Carpal tunnel  syndrome    Right   COPD with emphysema (HCC)    last exacerbation 10-16-2020 documented in epic    Depression    Dyslipidemia    Hemorrhoids    History of anal intraepithelial neoplasia III 10/2015   History of chlamydia    hx multiple rescurrent chlamydia,  last episode 05-20-2021 with positive RPR   History of pneumothorax 06/29/2018   post procedure , VATS w/ wedge resection RUL mass 06-23-2018   HIV (human immunodeficiency virus infection) (HCC)    followed by dr jinny. elaine ([place])   Internal hemorrhoids    Latent syphilis    Phimosis    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HCC) 01/06/2018   06/23/2018-surgical pathology report- lung wedge biopsy necrotizing granulomatous inflammation associated fibrosis, no evidence of malignancy   Rectal cancer (HCC) 01/2017   followed by GI-- dr s. stacia;; (previous oncologist in [place]-- dr lanny);;   treated in [facility],  dx 10/ 2018 and completed chemoradiation 01/ 2019   Screening for lung cancer 03/11/2023   Tick bite 08/08/2020   Tooth missing    per pt had two lower teeth and one molar extracted due to being broken 07-16-2021, pt stated there was no infection   Vaccine counseling 02/24/2023    Tobacco History: Social History   Tobacco Use  Smoking Status Every Day   Types: Cigars  Smokeless Tobacco Never  Tobacco Comments   6 cigars a day-per pt 07-17-2021   Ready to quit: Not Answered Counseling given: Not Answered Tobacco comments: 6 cigars a day-per pt 07-17-2021   Outpatient Medications Prior to Visit  Medication Sig Dispense Refill   albuterol  (VENTOLIN  HFA) 108 (90 Base) MCG/ACT inhaler Inhale 1-2 puffs into the lungs every 6 (six) hours as needed for wheezing or shortness of breath. 18 g 2   amLODipine  (NORVASC ) 5 MG tablet Take 1 tablet (5 mg total) by mouth daily. 30 tablet 11   atorvastatin  (LIPITOR) 20 MG tablet Take 1 tablet (20 mg total) by mouth daily. 30 tablet 11   bictegravir-emtricitabine -tenofovir  AF (BIKTARVY ) 50-200-25 MG TABS tablet Take 1 tablet by mouth daily. 30 tablet 5   ciclopirox  (PENLAC ) 8 %  solution Apply topically at bedtime. Apply over nail and surrounding skin. Apply daily over previous coat. After seven (7) days, may remove with alcohol and continue cycle. 6.6 mL 0   escitalopram  (LEXAPRO ) 5 MG tablet Take 1 tablet (5 mg total) by mouth daily. 30 tablet 11   gabapentin  (NEURONTIN ) 800 MG tablet Take by mouth.     gabapentin  (NEURONTIN ) 800 MG tablet Take 1 tablet (800 mg total) by mouth 3 (three) times daily. 90 tablet 11   mometasone -formoterol  (DULERA) 200-5 MCG/ACT AERO Inhale 2 puffs into the lungs in the morning and at bedtime. 13 g 5   mometasone -formoterol  (DULERA) 200-5 MCG/ACT AERO Inhale 2 puffs into the lungs in the morning and at bedtime. 1 each 11   Varenicline  Tartrate, Starter, (CHANTIX  STARTING MONTH PAK) 0.5 MG X 11 & 1 MG X 42 TBPK Per Starter pack instructions 1 each 0   Vitamin D , Ergocalciferol , (DRISDOL ) 1.25 MG (50000 UNIT) CAPS capsule Take 1 capsule (50,000 Units total) by mouth every 7 (seven) days. 5 capsule 11   No facility-administered medications prior to visit.      Review of Systems  Review of Systems   Physical Exam  There were no vitals taken for this visit. Physical Exam   Lab Results:  CBC    Component Value Date/Time  WBC 4.2 03/11/2023 1417   RBC 3.71 (L) 03/11/2023 1417   HGB 13.8 03/11/2023 1417   HGB 14.7 11/14/2019 1021   HCT 38.9 03/11/2023 1417   HCT 39.9 11/14/2019 1021   PLT 144 03/11/2023 1417   PLT 153 11/14/2019 1021   MCV 104.9 (H) 03/11/2023 1417   MCV 104 (H) 11/14/2019 1021   MCH 37.2 (H) 03/11/2023 1417   MCHC 35.5 03/11/2023 1417   RDW 13.0 03/11/2023 1417   RDW 12.8 11/14/2019 1021   LYMPHSABS 1.4 01/10/2020 0223   LYMPHSABS 1.1 11/14/2019 1021   MONOABS 0.5 01/10/2020 0223   EOSABS 151 03/11/2023 1417   EOSABS 0.1 11/14/2019 1021   BASOSABS 21 03/11/2023 1417   BASOSABS 0.0 11/14/2019 1021    BMET    Component Value Date/Time   NA 140 03/11/2023 1417   NA 139 03/24/2019 1328   K 4.1  03/11/2023 1417   CL 107 03/11/2023 1417   CO2 27 03/11/2023 1417   GLUCOSE 73 03/11/2023 1417   BUN 14 03/11/2023 1417   BUN 17 03/24/2019 1328   CREATININE 1.37 (H) 03/11/2023 1417   CALCIUM  9.0 03/11/2023 1417   GFRNONAA >60 01/12/2020 0222   GFRNONAA 63 06/20/2019 1026   GFRAA >60 01/12/2020 0222   GFRAA 73 06/20/2019 1026    BNP No results found for: BNP  ProBNP No results found for: PROBNP  Imaging: No results found.   Assessment & Plan:   No problem-specific Assessment & Plan notes found for this encounter.     Almarie LELON Ferrari, NP 10/29/2023

## 2023-10-30 ENCOUNTER — Ambulatory Visit: Admitting: Primary Care

## 2023-11-02 ENCOUNTER — Other Ambulatory Visit: Payer: Self-pay | Admitting: Infectious Disease

## 2023-11-03 NOTE — Telephone Encounter (Signed)
 Patient has primary care provider.   Phebe Dettmer, BSN, RN

## 2023-11-14 ENCOUNTER — Other Ambulatory Visit: Payer: Self-pay | Admitting: Infectious Disease

## 2023-11-16 NOTE — Telephone Encounter (Signed)
 Patient has PCP:   Ilda Cadet, PA-C     PCP - General, Family Medicine    Since 03/16/2023    [phone]

## 2023-11-18 ENCOUNTER — Other Ambulatory Visit: Payer: Self-pay | Admitting: Infectious Disease

## 2023-11-18 ENCOUNTER — Telehealth: Payer: Self-pay

## 2023-11-18 DIAGNOSIS — B20 Human immunodeficiency virus [HIV] disease: Secondary | ICD-10-CM

## 2023-11-18 MED ORDER — BIKTARVY 50-200-25 MG PO TABS: 1.0000 | ORAL_TABLET | Freq: Every day | ORAL | 0 refills | Status: DC

## 2023-11-18 NOTE — Telephone Encounter (Signed)
 Patient has primary care provider and was advised to contact their office.   Jeremiah [name], BSN, RN

## 2023-11-18 NOTE — Telephone Encounter (Signed)
 Patient left voicemail requesting Biktarvy  refills. Florence Sharper and scheduled for follow up with Dr. Fleeta Rothman. He says he needs his BP meds as well. Discussed that since he now has a PCP that he would need to contact their office for maintenance medications. Provided him with their phone number and address. Patient verbalized understanding and has no further questions.   Biktarvy  refill sent.   Gavin Faivre, BSN, RN

## 2023-12-07 NOTE — Progress Notes (Deleted)
 Subjective:  Chief complaint: follow-up for HIV disease on medications   Patient ID: Jeremiah [name], male    DOB: Feb 25, 1971, 53 y.o.   MRN: [id number]  HPI   Past Medical History:  Diagnosis Date   Anal condyloma    Anxiety    Carpal tunnel syndrome    Right   COPD with emphysema (HCC)    last exacerbation 10-16-2020 documented in epic   Depression    Dyslipidemia    Hemorrhoids    History of anal intraepithelial neoplasia III 10/2015   History of chlamydia    hx multiple rescurrent chlamydia,  last episode 05-20-2021 with positive RPR   History of pneumothorax 06/29/2018   post procedure , VATS w/ wedge resection RUL mass 06-23-2018   HIV (human immunodeficiency virus infection) (HCC)    followed by dr jinny. elaine (ID)   Internal hemorrhoids    Latent syphilis    Phimosis    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HCC) 01/06/2018   06/23/2018-surgical pathology report- lung wedge biopsy necrotizing granulomatous inflammation associated fibrosis, no evidence of malignancy   Rectal cancer (HCC) 01/2017   followed by GI-- dr s. stacia;; (previous oncologist in -- dr lanny);;   treated in [facility],  dx 10/ 2018 and completed chemoradiation 01/ 2019   Screening for lung cancer 03/11/2023   Tick bite 08/08/2020   Tooth missing    per pt had two lower teeth and one molar extracted due to being broken 07-16-2021, pt stated there was no infection   Vaccine counseling 02/24/2023    Past Surgical History:  Procedure Laterality Date   CIRCUMCISION N/A 09/11/2021   Procedure: CIRCUMCISION ADULT;  Surgeon: Devere Lonni Righter, MD;  Location: [facility];  Service: Urology;  Laterality: N/A;   COLONOSCOPY WITH PROPOFOL   06/28/2021   by dr s. stacia   IR RADIOLOGIST EVAL & MGMT  05/17/2019   IR RADIOLOGY PERIPHERAL GUIDED IV START  05/20/2019   IR TRANSCATH RETRIEVAL FB INCL GUIDANCE (MS)  05/20/2019   IR US  GUIDE VASC ACCESS RIGHT  05/20/2019   LAPAROSCOPIC APPENDECTOMY N/A  01/11/2020   Procedure: APPENDECTOMY LAPAROSCOPIC;  Surgeon: Vernetta Berg, MD;  Location: [facility];  Service: General;  Laterality: N/A;   LAPAROSCOPIC INGUINAL HERNIA REPAIR  1991   ORCHIECTOMY  2016   unilateral   PORT-A-CATH REMOVAL N/A 05/11/2019   Procedure: PARTIAL PORT REMOVAL;  Surgeon: Debby Hila, MD;  Location: [facility];  Service: General;  Laterality: N/A;   VIDEO ASSISTED THORACOSCOPY (VATS)/WEDGE RESECTION Right 06/23/2018   Procedure: VIDEO ASSISTED THORACOSCOPY (VATS)/LUNG RESECTION, stapling and dissection of apical bleb, wedge resection of right upper lobe mass, lymph node dissection, intercostal nerve block;  Surgeon: Army Dallas NOVAK, MD;  Location: [facility];  Service: Thoracic;  Laterality: Right;   VIDEO BRONCHOSCOPY N/A 06/23/2018   Procedure: VIDEO BRONCHOSCOPY;  Surgeon: Army Dallas NOVAK, MD;  Location: [facility] OR;  Service: Thoracic;  Laterality: N/A;    Family History  Problem Relation Age of Onset   Diabetes Mother    Hypertension Mother    Cancer Mother        breast   Diabetes Father    Hypertension Father    Colon cancer Neg Hx    Colon polyps Neg Hx    Esophageal cancer Neg Hx    Stomach cancer Neg Hx    Rectal cancer Neg Hx       Social History   Socioeconomic History   Marital status: Single  Spouse name: Not on file   Number of children: Not on file   Years of education: Not on file   Highest education level: Not on file  Occupational History   Not on file  Tobacco Use   Smoking status: Every Day    Types: Cigars   Smokeless tobacco: Never   Tobacco comments:    6 cigars a day-per pt 07-17-2021  Vaping Use   Vaping status: Never Used  Substance and Sexual Activity   Alcohol use: Not Currently    Comment: 1.5 pt liquior day  , none since 09/ 2022   Drug use: Not Currently    Comment: Past history of crack cocaine use ,  last time 09/ 2022   Sexual activity: Not Currently    Partners: Male    Birth  control/protection: Condom    Comment: declined condoms  Other Topics Concern   Not on file  Social History Narrative   Not on file   Social Drivers of Health   Financial Resource Strain: Low Risk  (11/21/2022)   Received from [facility]   Overall Financial Resource Strain (CARDIA)    Difficulty of Paying Living Expenses: Not hard at all  Food Insecurity: No Food Insecurity (11/21/2022)   Received from [facility]   Hunger Vital Sign    Within the past 12 months, you worried that your food would run out before you got the money to buy more.: Never true    Within the past 12 months, the food you bought just didn't last and you didn't have money to get more.: Never true  Transportation Needs: No Transportation Needs (11/21/2022)   Received from [facility] - Transportation    Lack of Transportation (Medical): No    Lack of Transportation (Non-Medical): No  Physical Activity: Not on file  Stress: Not on file  Social Connections: Unknown (06/27/2022)   Received [facility]   Social Network    Social Network: Not on file    Allergies  Allergen Reactions   Nsaids Other (See Comments)    DRUG INTERACTION WITH HIV MEDS   Sulfa  Antibiotics Itching     Current Outpatient Medications:    albuterol  (VENTOLIN  HFA) 108 (90 Base) MCG/ACT inhaler, Inhale 1-2 puffs into the lungs every 6 (six) hours as needed for wheezing or shortness of breath., Disp: 18 g, Rfl: 2   amLODipine  (NORVASC ) 5 MG tablet, Take 1 tablet (5 mg total) by mouth daily., Disp: 30 tablet, Rfl: 11   atorvastatin  (LIPITOR) 20 MG tablet, Take 1 tablet (20 mg total) by mouth daily., Disp: 30 tablet, Rfl: 11   bictegravir-emtricitabine -tenofovir  AF (BIKTARVY ) 50-200-25 MG TABS tablet, Take 1 tablet by mouth daily., Disp: 30 tablet, Rfl: 0   ciclopirox  (PENLAC ) 8 % solution, Apply topically at bedtime. Apply over nail and surrounding skin. Apply daily over previous coat. After seven (7) days, may remove with  alcohol and continue cycle., Disp: 6.6 mL, Rfl: 0   escitalopram  (LEXAPRO ) 5 MG tablet, Take 1 tablet (5 mg total) by mouth daily., Disp: 30 tablet, Rfl: 11   gabapentin  (NEURONTIN ) 800 MG tablet, Take by mouth., Disp: , Rfl:    gabapentin  (NEURONTIN ) 800 MG tablet, Take 1 tablet (800 mg total) by mouth 3 (three) times daily., Disp: 90 tablet, Rfl: 11   mometasone -formoterol  (DULERA) 200-5 MCG/ACT AERO, Inhale 2 puffs into the lungs in the morning and at bedtime., Disp: 13 g, Rfl: 5   mometasone -formoterol  (DULERA) 200-5  MCG/ACT AERO, Inhale 2 puffs into the lungs in the morning and at bedtime., Disp: 1 each, Rfl: 11   Varenicline  Tartrate, Starter, (CHANTIX  STARTING MONTH PAK) 0.5 MG X 11 & 1 MG X 42 TBPK, Per Starter pack instructions, Disp: 1 each, Rfl: 0   Vitamin D , Ergocalciferol , (DRISDOL ) 1.25 MG (50000 UNIT) CAPS capsule, Take 1 capsule (50,000 Units total) by mouth every 7 (seven) days., Disp: 5 capsule, Rfl: 11   Review of Systems     Objective:   Physical Exam        Assessment & Plan:

## 2023-12-08 ENCOUNTER — Ambulatory Visit: Admitting: Infectious Disease

## 2023-12-08 ENCOUNTER — Other Ambulatory Visit: Payer: Self-pay | Admitting: Infectious Disease

## 2023-12-08 DIAGNOSIS — F17210 Nicotine dependence, cigarettes, uncomplicated: Secondary | ICD-10-CM

## 2023-12-08 DIAGNOSIS — E785 Hyperlipidemia, unspecified: Secondary | ICD-10-CM

## 2023-12-08 DIAGNOSIS — B20 Human immunodeficiency virus [HIV] disease: Secondary | ICD-10-CM

## 2023-12-08 DIAGNOSIS — I10 Essential (primary) hypertension: Secondary | ICD-10-CM

## 2023-12-08 DIAGNOSIS — C210 Malignant neoplasm of anus, unspecified: Secondary | ICD-10-CM

## 2023-12-08 NOTE — Telephone Encounter (Signed)
 Patient has PCP. Requested refill request be sent to their office.  Ilda Cadet, PA-C     PCP - General, Family Medicine    Since 03/16/2023    [phone]  Duwaine Lowe, BSN, RN

## 2023-12-27 ENCOUNTER — Encounter: Payer: Self-pay | Admitting: Infectious Disease

## 2023-12-27 ENCOUNTER — Ambulatory Visit
Admission: EM | Admit: 2023-12-27 | Discharge: 2023-12-27 | Disposition: A | Discharge status: Home / Self Care | Attending: Family Medicine | Admitting: Family Medicine

## 2023-12-27 ENCOUNTER — Other Ambulatory Visit: Payer: Self-pay

## 2023-12-27 DIAGNOSIS — J441 Chronic obstructive pulmonary disease with (acute) exacerbation: Secondary | ICD-10-CM

## 2023-12-27 DIAGNOSIS — R051 Acute cough: Secondary | ICD-10-CM

## 2023-12-27 LAB — POC SOFIA SARS ANTIGEN FIA: SARS Coronavirus 2 Ag: NEGATIVE

## 2023-12-27 MED ORDER — PREDNISONE 20 MG PO TABS: 40.0000 mg | ORAL_TABLET | Freq: Every day | ORAL | 0 refills | Status: AC

## 2023-12-27 MED ORDER — BENZONATATE 200 MG PO CAPS: 200.0000 mg | ORAL_CAPSULE | Freq: Three times a day (TID) | ORAL | 0 refills | Status: AC | PRN

## 2023-12-27 MED ORDER — AMOXICILLIN-POT CLAVULANATE 875-125 MG PO TABS: 1.0000 | ORAL_TABLET | Freq: Two times a day (BID) | ORAL | 0 refills | Status: AC

## 2023-12-27 MED ORDER — IPRATROPIUM-ALBUTEROL 0.5-2.5 (3) MG/3ML IN SOLN
3.0000 mL | Freq: Once | RESPIRATORY_TRACT | Status: AC
  Administered 2023-12-27: 3 mL via RESPIRATORY_TRACT

## 2023-12-27 NOTE — ED Triage Notes (Addendum)
 Pt c/o HA, nausea, productive cough w/yellow mucous, chest an nasal congestion5d. Pt's sclera of eyes are jaundiced bilat

## 2023-12-27 NOTE — Progress Notes (Unsigned)
 Subjective:  Chief complaint: follow-up for HIV disease on medications   Patient ID: Jeremiah [name], male    DOB: 13-Apr-1971, 53 y.o.   MRN: [id number]  HPI  Past Medical History:  Diagnosis Date   Anal condyloma    Anxiety    Carpal tunnel syndrome    Right   COPD exacerbation (HCC) 12/27/2023   COPD with emphysema (HCC)    last exacerbation 10-16-2020 documented in epic   Depression    Dyslipidemia    Hemorrhoids    History of anal intraepithelial neoplasia III 10/2015   History of chlamydia    hx multiple rescurrent chlamydia,  last episode 05-20-2021 with positive RPR   History of pneumothorax 06/29/2018   post procedure , VATS w/ wedge resection RUL mass 06-23-2018   HIV (human immunodeficiency virus infection) (HCC)    followed by dr jinny. elaine (ID)   Internal hemorrhoids    Latent syphilis    Phimosis    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HCC) 01/06/2018   06/23/2018-surgical pathology report- lung wedge biopsy necrotizing granulomatous inflammation associated fibrosis, no evidence of malignancy   Rectal cancer (HCC) 01/2017   followed by GI-- dr s. stacia;; (previous oncologist in [place]-- dr lanny);;   treated in [facility],  dx 10/ 2018 and completed chemoradiation 01/ 2019   Screening for lung cancer 03/11/2023   Tick bite 08/08/2020   Tooth missing    per pt had two lower teeth and one molar extracted due to being broken 07-16-2021, pt stated there was no infection   Vaccine counseling 02/24/2023    Past Surgical History:  Procedure Laterality Date   CIRCUMCISION N/A 09/11/2021   Procedure: CIRCUMCISION ADULT;  Surgeon: Devere Lonni Righter, MD;  Location: [facility];  Service: Urology;  Laterality: N/A;   COLONOSCOPY WITH PROPOFOL   06/28/2021   by dr s. stacia   IR RADIOLOGIST EVAL & MGMT  05/17/2019   IR RADIOLOGY PERIPHERAL GUIDED IV START  05/20/2019   IR TRANSCATH RETRIEVAL FB INCL GUIDANCE (MS)  05/20/2019   IR US  GUIDE VASC ACCESS RIGHT   05/20/2019   LAPAROSCOPIC APPENDECTOMY N/A 01/11/2020   Procedure: APPENDECTOMY LAPAROSCOPIC;  Surgeon: Vernetta Berg, MD;  Location: [facility];  Service: General;  Laterality: N/A;   LAPAROSCOPIC INGUINAL HERNIA REPAIR  1991   ORCHIECTOMY  2016   unilateral   PORT-A-CATH REMOVAL N/A 05/11/2019   Procedure: PARTIAL PORT REMOVAL;  Surgeon: Debby Hila, MD;  Location: [facility] [place];  Service: General;  Laterality: N/A;   VIDEO ASSISTED THORACOSCOPY (VATS)/WEDGE RESECTION Right 06/23/2018   Procedure: VIDEO ASSISTED THORACOSCOPY (VATS)/LUNG RESECTION, stapling and dissection of apical bleb, wedge resection of right upper lobe mass, lymph node dissection, intercostal nerve block;  Surgeon: Army Dallas NOVAK, MD;  Location: [facility];  Service: Thoracic;  Laterality: Right;   VIDEO BRONCHOSCOPY N/A 06/23/2018   Procedure: VIDEO BRONCHOSCOPY;  Surgeon: Army Dallas NOVAK, MD;  Location: [facility] OR;  Service: Thoracic;  Laterality: N/A;    Family History  Problem Relation Age of Onset   Diabetes Mother    Hypertension Mother    Cancer Mother        breast   Diabetes Father    Hypertension Father    Colon cancer Neg Hx    Colon polyps Neg Hx    Esophageal cancer Neg Hx    Stomach cancer Neg Hx    Rectal cancer Neg Hx       Social History   Socioeconomic History  Marital status: Single    Spouse name: Not on file   Number of children: Not on file   Years of education: Not on file   Highest education level: Not on file  Occupational History   Not on file  Tobacco Use   Smoking status: Every Day    Types: Cigars   Smokeless tobacco: Never   Tobacco comments:    6 cigars a day-per pt 07-17-2021  Vaping Use   Vaping status: Never Used  Substance and Sexual Activity   Alcohol use: Not Currently    Comment: 1.5 pt liquior day  , none since 09/ 2022   Drug use: Not Currently    Comment: Past history of crack cocaine use ,  last time 09/ 2022   Sexual activity: Not  Currently    Partners: Male    Birth control/protection: Condom    Comment: declined condoms  Other Topics Concern   Not on file  Social History Narrative   Not on file   Social Drivers of Health   Financial Resource Strain: Low Risk  (11/21/2022)   Received from [facility]   Overall Financial Resource Strain (CARDIA)    Difficulty of Paying Living Expenses: Not hard at all  Food Insecurity: No Food Insecurity (11/21/2022)   Received from [facility]   Hunger Vital Sign    Within the past 12 months, you worried that your food would run out before you got the money to buy more.: Never true    Within the past 12 months, the food you bought just didn't last and you didn't have money to get more.: Never true  Transportation Needs: No Transportation Needs (11/21/2022)   Received from [facility] - Transportation    Lack of Transportation (Medical): No    Lack of Transportation (Non-Medical): No  Physical Activity: Not on file  Stress: Not on file  Social Connections: Unknown (06/27/2022)   Received from [facility]   Social Network    Social Network: Not on file    Allergies  Allergen Reactions   Nsaids Other (See Comments)    DRUG INTERACTION WITH HIV MEDS   Sulfa  Antibiotics Itching     Current Outpatient Medications:    albuterol  (VENTOLIN  HFA) 108 (90 Base) MCG/ACT inhaler, Inhale 1-2 puffs into the lungs every 6 (six) hours as needed for wheezing or shortness of breath., Disp: 18 g, Rfl: 2   amLODipine  (NORVASC ) 5 MG tablet, Take 1 tablet (5 mg total) by mouth daily., Disp: 30 tablet, Rfl: 11   amoxicillin -clavulanate (AUGMENTIN ) 875-125 MG tablet, Take 1 tablet by mouth every 12 (twelve) hours., Disp: 14 tablet, Rfl: 0   atorvastatin  (LIPITOR) 20 MG tablet, Take 1 tablet (20 mg total) by mouth daily., Disp: 30 tablet, Rfl: 11   benzonatate  (TESSALON ) 200 MG capsule, Take 1 capsule (200 mg total) by mouth 3 (three) times daily as needed., Disp: 20 capsule,  Rfl: 0   bictegravir-emtricitabine -tenofovir  AF (BIKTARVY ) 50-200-25 MG TABS tablet, Take 1 tablet by mouth daily., Disp: 30 tablet, Rfl: 0   ciclopirox  (PENLAC ) 8 % solution, Apply topically at bedtime. Apply over nail and surrounding skin. Apply daily over previous coat. After seven (7) days, may remove with alcohol and continue cycle., Disp: 6.6 mL, Rfl: 0   escitalopram  (LEXAPRO ) 5 MG tablet, Take 1 tablet (5 mg total) by mouth daily., Disp: 30 tablet, Rfl: 11   gabapentin  (NEURONTIN ) 800 MG tablet, Take by mouth., Disp: , Rfl:  gabapentin  (NEURONTIN ) 800 MG tablet, Take 1 tablet (800 mg total) by mouth 3 (three) times daily., Disp: 90 tablet, Rfl: 11   mometasone -formoterol  (DULERA) 200-5 MCG/ACT AERO, Inhale 2 puffs into the lungs in the morning and at bedtime., Disp: 13 g, Rfl: 5   mometasone -formoterol  (DULERA) 200-5 MCG/ACT AERO, Inhale 2 puffs into the lungs in the morning and at bedtime., Disp: 1 each, Rfl: 11   predniSONE  (DELTASONE ) 20 MG tablet, Take 2 tablets (40 mg total) by mouth daily with breakfast for 5 days., Disp: 10 tablet, Rfl: 0   Varenicline  Tartrate, Starter, (CHANTIX  STARTING MONTH PAK) 0.5 MG X 11 & 1 MG X 42 TBPK, Per Starter pack instructions, Disp: 1 each, Rfl: 0   Vitamin D , Ergocalciferol , (DRISDOL ) 1.25 MG (50000 UNIT) CAPS capsule, Take 1 capsule (50,000 Units total) by mouth every 7 (seven) days., Disp: 5 capsule, Rfl: 11   Review of Systems     Objective:   Physical Exam        Assessment & Plan:

## 2023-12-27 NOTE — Discharge Instructions (Addendum)
 You tested negative for COVID.  Please start Augmentin  twice daily for 7 days.  Also take prednisone  daily for 5 days.  You may use Tessalon  3 times a day as needed for your cough.  Continue your inhalers as previously prescribed.  Lots of rest and follow-up with your PCP if your symptoms do not improve.  Please go to the ER for any worsening symptoms.  Hope you feel better soon!
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
## 2023-12-28 ENCOUNTER — Other Ambulatory Visit (HOSPITAL_COMMUNITY)
Admission: RE | Admit: 2023-12-28 | Discharge: 2023-12-28 | Disposition: A | Discharge status: Home / Self Care | Source: Ambulatory Visit | Attending: Infectious Disease | Admitting: Infectious Disease

## 2023-12-28 ENCOUNTER — Ambulatory Visit (INDEPENDENT_AMBULATORY_CARE_PROVIDER_SITE_OTHER): Admitting: Infectious Disease

## 2023-12-28 ENCOUNTER — Other Ambulatory Visit: Payer: Self-pay

## 2023-12-28 ENCOUNTER — Encounter: Payer: Self-pay | Admitting: Infectious Disease

## 2023-12-28 VITALS — BP 146/87 | HR 86 | Temp 98.1°F | Wt 172.0 lb

## 2023-12-28 DIAGNOSIS — B20 Human immunodeficiency virus [HIV] disease: Secondary | ICD-10-CM | POA: Diagnosis present

## 2023-12-28 DIAGNOSIS — I10 Essential (primary) hypertension: Secondary | ICD-10-CM

## 2023-12-28 DIAGNOSIS — J441 Chronic obstructive pulmonary disease with (acute) exacerbation: Secondary | ICD-10-CM | POA: Insufficient documentation

## 2023-12-28 DIAGNOSIS — Z7185 Encounter for immunization safety counseling: Secondary | ICD-10-CM | POA: Diagnosis not present

## 2023-12-28 DIAGNOSIS — C210 Malignant neoplasm of anus, unspecified: Secondary | ICD-10-CM

## 2023-12-28 DIAGNOSIS — E785 Hyperlipidemia, unspecified: Secondary | ICD-10-CM | POA: Diagnosis not present

## 2023-12-28 DIAGNOSIS — F17210 Nicotine dependence, cigarettes, uncomplicated: Secondary | ICD-10-CM | POA: Diagnosis present

## 2023-12-28 MED ORDER — BIKTARVY 50-200-25 MG PO TABS: 1.0000 | ORAL_TABLET | Freq: Every day | ORAL | 0 refills | Status: DC

## 2023-12-28 MED ORDER — GABAPENTIN 800 MG PO TABS: 800.0000 mg | ORAL_TABLET | Freq: Two times a day (BID) | ORAL | 1 refills | Status: AC

## 2023-12-28 MED ORDER — ATORVASTATIN CALCIUM 20 MG PO TABS: 20.0000 mg | ORAL_TABLET | Freq: Every day | ORAL | 11 refills | Status: DC

## 2023-12-28 MED ORDER — ATORVASTATIN CALCIUM 20 MG PO TABS: 20.0000 mg | ORAL_TABLET | Freq: Every day | ORAL | 11 refills | Status: AC

## 2023-12-28 MED ORDER — ESCITALOPRAM OXALATE 5 MG PO TABS: 5.0000 mg | ORAL_TABLET | Freq: Every day | ORAL | 11 refills | Status: AC

## 2023-12-28 MED ORDER — GABAPENTIN 800 MG PO TABS: 800.0000 mg | ORAL_TABLET | Freq: Three times a day (TID) | ORAL | 11 refills | Status: AC

## 2023-12-28 MED ORDER — BIKTARVY 50-200-25 MG PO TABS
1.0000 | ORAL_TABLET | Freq: Every day | ORAL | 0 refills | Status: DC
Start: 2023-12-28 — End: 2024-01-28

## 2023-12-29 ENCOUNTER — Other Ambulatory Visit: Payer: Self-pay

## 2023-12-29 ENCOUNTER — Ambulatory Visit: Payer: Self-pay

## 2023-12-29 DIAGNOSIS — B20 Human immunodeficiency virus [HIV] disease: Secondary | ICD-10-CM

## 2023-12-29 LAB — CYTOLOGY, (ORAL, ANAL, URETHRAL) ANCILLARY ONLY
Chlamydia: NEGATIVE
Chlamydia: NEGATIVE
Comment: NEGATIVE
Comment: NEGATIVE
Comment: NORMAL
Comment: NORMAL
Neisseria Gonorrhea: NEGATIVE
Neisseria Gonorrhea: NEGATIVE

## 2023-12-29 LAB — URINE CYTOLOGY ANCILLARY ONLY
Chlamydia: NEGATIVE
Comment: NEGATIVE
Comment: NORMAL
Neisseria Gonorrhea: NEGATIVE

## 2023-12-29 LAB — T-HELPER CELLS (CD4) COUNT (NOT AT ARMC)
CD4 % Helper T Cell: 10 % — ABNORMAL LOW (ref 33–65)
CD4 T Cell Abs: 86 /uL — ABNORMAL LOW (ref 400–1790)

## 2023-12-29 MED ORDER — DAPSONE 100 MG PO TABS: 100.0000 mg | ORAL_TABLET | Freq: Every day | ORAL | 2 refills | Status: AC

## 2023-12-29 NOTE — Telephone Encounter (Signed)
-----   Message from [place] sent at 12/29/2023  1:48 PM EDT ----- Regarding: RE: Yes it's one DS bactrim  tablet 160/800 strength daily and he needs to be seen in a month or so ----- Message ----- From: Dalila Annabella SQUIBB, CMA Sent: 12/29/2023   1:12 PM EDT To: Jomarie LOISE Fleeta Kathie, MD  ----- Message from Annabella SQUIBB Dalila, CMA sent at 12/29/2023  1:12 PM EDT -----   ----- Message ----- From: Fleeta Kathie, Jomarie LOISE, MD Sent: 12/29/2023  12:00 PM EDT To: Burnard JONELLE Ellen, PA-C; [facility] Triage Nurse Po#  Harlin's CD4 is 80. He needs to start Bactrim  DS daily to prevent PCP If he is viremic--which I suspect should consider option of CROWN for him ----- Message ----- From: Interface, [facility] Lab Results In Sent: 12/28/2023  10:47 PM EDT To: Jomarie LOISE Fleeta Kathie, MD

## 2023-12-29 NOTE — Telephone Encounter (Signed)
 Patient aware.  Can you give directions for bactrim  please and I will send it in to [facility] on [address]. Also do you want him to come back any sooner than 6 months to recheck?   Jeremiah [name] SHAUNNA Letters, CMA

## 2023-12-29 NOTE — Telephone Encounter (Signed)
-----   Message from [place] sent at 12/29/2023 12:00 PM EDT ----- [name]'s CD4 is 80. He needs to start Bactrim  DS daily to prevent PCP If he is viremic--which I suspect should consider option of CROWN for him ----- Message ----- From: Interface, [facility] Lab Results In Sent: 12/28/2023  10:47 PM EDT To: Jeremiah [name] Fleeta Kathie, MD

## 2023-12-29 NOTE — Telephone Encounter (Signed)
 Patient aware do NOT start dapsone  until lab work is back.   Jeremiah [name] SHAUNNA Letters, CMA

## 2023-12-29 NOTE — Telephone Encounter (Signed)
 Per Dr.[place] Dapsone  100 MG 1 tablet daily. Patient needs gp6d level checked. Scheduled for lab appointment tomorrow at 8:30 AM.   Annabella SHAUNNA Letters, CMA

## 2023-12-30 ENCOUNTER — Other Ambulatory Visit: Payer: Self-pay

## 2023-12-30 ENCOUNTER — Other Ambulatory Visit: Payer: Self-pay | Admitting: Infectious Disease

## 2023-12-30 ENCOUNTER — Other Ambulatory Visit

## 2023-12-30 DIAGNOSIS — B20 Human immunodeficiency virus [HIV] disease: Secondary | ICD-10-CM

## 2023-12-30 NOTE — Telephone Encounter (Signed)
 Patient has PCP. Requested pharmacy send request to  Ilda Cadet, PA-C     PCP - General, Family Medicine    Since 03/16/2023    [phone]

## 2023-12-31 LAB — CBC WITH DIFFERENTIAL/PLATELET
Absolute Lymphocytes: 974 {cells}/uL (ref 850–3900)
Absolute Monocytes: 220 {cells}/uL (ref 200–950)
Basophils Absolute: 12 {cells}/uL (ref 0–200)
Basophils Relative: 0.1 %
Eosinophils Absolute: 0 {cells}/uL — ABNORMAL LOW (ref 15–500)
Eosinophils Relative: 0 %
HCT: 41.7 % (ref 38.5–50.0)
Hemoglobin: 14.3 g/dL (ref 13.2–17.1)
MCH: 37 pg — ABNORMAL HIGH (ref 27.0–33.0)
MCHC: 34.3 g/dL (ref 32.0–36.0)
MCV: 107.8 fL — ABNORMAL HIGH (ref 80.0–100.0)
MPV: 10.6 fL (ref 7.5–12.5)
Monocytes Relative: 1.9 %
Neutro Abs: 10394 {cells}/uL — ABNORMAL HIGH (ref 1500–7800)
Neutrophils Relative %: 89.6 %
Platelets: 154 Thousand/uL (ref 140–400)
RBC: 3.87 Million/uL — ABNORMAL LOW (ref 4.20–5.80)
RDW: 12.4 % (ref 11.0–15.0)
Total Lymphocyte: 8.4 %
WBC: 11.6 Thousand/uL — ABNORMAL HIGH (ref 3.8–10.8)

## 2023-12-31 LAB — COMPLETE METABOLIC PANEL WITHOUT GFR
AG Ratio: 2 (calc) (ref 1.0–2.5)
ALT: 34 U/L (ref 9–46)
AST: 44 U/L — ABNORMAL HIGH (ref 10–35)
Albumin: 4.7 g/dL (ref 3.6–5.1)
Alkaline phosphatase (APISO): 49 U/L (ref 35–144)
BUN: 20 mg/dL (ref 7–25)
CO2: 25 mmol/L (ref 20–32)
Calcium: 9.6 mg/dL (ref 8.6–10.3)
Chloride: 108 mmol/L (ref 98–110)
Creat: 1.11 mg/dL (ref 0.70–1.30)
Globulin: 2.3 g/dL (ref 1.9–3.7)
Glucose, Bld: 114 mg/dL — ABNORMAL HIGH (ref 65–99)
Potassium: 4.3 mmol/L (ref 3.5–5.3)
Sodium: 140 mmol/L (ref 135–146)
Total Bilirubin: 1.7 mg/dL — ABNORMAL HIGH (ref 0.2–1.2)
Total Protein: 7 g/dL (ref 6.1–8.1)

## 2023-12-31 LAB — T PALLIDUM AB: T Pallidum Abs: POSITIVE — AB

## 2023-12-31 LAB — HIV-1 RNA QUANT-NO REFLEX-BLD
HIV 1 RNA Quant: NOT DETECTED {copies}/mL
HIV-1 RNA Quant, Log: NOT DETECTED {Log_copies}/mL

## 2023-12-31 LAB — LIPID PANEL
Cholesterol: 140 mg/dL (ref <200)
HDL: 64 mg/dL (ref >=40)
LDL Cholesterol (Calc): 53 mg/dL
Non-HDL Cholesterol (Calc): 76 mg/dL (ref <130)
Total CHOL/HDL Ratio: 2.2 (calc) (ref <5.0)
Triglycerides: 150 mg/dL — ABNORMAL HIGH (ref <150)

## 2023-12-31 LAB — RPR TITER: RPR Titer: 1:2 {titer} — ABNORMAL HIGH

## 2023-12-31 LAB — RPR: RPR Ser Ql: REACTIVE — AB

## 2024-01-01 ENCOUNTER — Ambulatory Visit: Admitting: Primary Care

## 2024-01-01 LAB — CYTOLOGY - PAP

## 2024-01-02 LAB — GLUCOSE 6 PHOSPHATE DEHYDROGENASE: G-6PDH: 15.1 U/g{Hb} (ref 7.0–20.5)

## 2024-01-04 ENCOUNTER — Ambulatory Visit: Admitting: Infectious Disease

## 2024-01-04 ENCOUNTER — Other Ambulatory Visit: Payer: Self-pay | Admitting: Infectious Disease

## 2024-01-04 ENCOUNTER — Encounter: Payer: Self-pay | Admitting: Infectious Disease

## 2024-01-04 ENCOUNTER — Ambulatory Visit: Payer: Self-pay

## 2024-01-04 DIAGNOSIS — R85612 Low grade squamous intraepithelial lesion on cytologic smear of anus (LGSIL): Secondary | ICD-10-CM | POA: Insufficient documentation

## 2024-01-04 NOTE — Telephone Encounter (Signed)
-----   Message from [place] sent at 01/04/2024 11:29 AM EDT ----- Regarding: RE: dapsone  should be fine to be taking Oh just wanted to say he is good to be dapsone  which he should start if not already begun ----- Message ----- From: Dalila Annabella SQUIBB, CMA Sent: 01/04/2024  10:25 AM EDT To: Jomarie LOISE Fleeta Kathie, MD Subject: RE: dapsone  should be fine to be taking        Hey, there wasn't a message with results.  ----- Message ----- From: Fleeta Kathie, Jomarie LOISE, MD Sent: 01/03/2024   3:17 PM EDT To: [facility] Triage Nurse Pool Subject: dapsone  should be fine to be taking             ----- Message ----- From: Rebecka [facility] Lab Results In Sent: 01/02/2024   3:29 PM EDT To: Jomarie LOISE Fleeta Kathie, MD

## 2024-01-04 NOTE — Telephone Encounter (Signed)
 Patient aware.  Adric Wrede Lesli Albee, CMA

## 2024-01-04 NOTE — Telephone Encounter (Signed)
 Spoke with patient regarding results. Had questions about result meaning and what he needs to do next. Informed him that referral has been sent to [facility] and that they would need to do additional screening for more information. Verbalized understanding. Declined office number to call and schedule appt. Pt did have questions about medication as well. Call transferred to pharmacy voicemail to follow up on. Lorenda CHRISTELLA Code, RMA

## 2024-01-04 NOTE — Telephone Encounter (Signed)
-----   Message from [place] sent at 01/04/2024 11:30 AM EDT ----- LSIL on anal pap. I put in referral to [facility] ----- Message ----- From: Interface, [facility] Lab Results In Sent: 12/28/2023  10:47 PM EDT To: Jomarie LOISE Fleeta Kathie, MD

## 2024-01-19 ENCOUNTER — Telehealth: Payer: Self-pay

## 2024-01-19 NOTE — Telephone Encounter (Signed)
 Received voicemail from patient requesting call back to review any potential interactions with his vitamins. Called Braelyn back, no answer. Left HIPAA compliant voicemail stating MyChart message would be sent and to call with any questions.   Ree Alcalde, BSN, RN

## 2024-01-25 ENCOUNTER — Ambulatory Visit: Admitting: Primary Care

## 2024-01-25 DIAGNOSIS — R911 Solitary pulmonary nodule: Secondary | ICD-10-CM

## 2024-01-25 DIAGNOSIS — J439 Emphysema, unspecified: Secondary | ICD-10-CM

## 2024-01-25 DIAGNOSIS — J441 Chronic obstructive pulmonary disease with (acute) exacerbation: Secondary | ICD-10-CM

## 2024-01-27 ENCOUNTER — Telehealth: Payer: Self-pay

## 2024-01-27 NOTE — Telephone Encounter (Signed)
 Received call from patient wanting to discuss vitamin B12 levels and supplementation. Discussed that his PCP, Lang Hurl, PA ordered this level and encouraged him to reach out to his office to discuss.   He wants to ensure that whatever supplementation he takes will not interfere with his Biktarvy . Reviewed that any supplements with magnesium , aluminum, calcium , or iron, would need to be separated from his Biktarvy .   Encouraged him to take a picture of the bottle label and send via MyChart to have pharmacy team review. Patient verbalized understanding and has no further questions.   Haidar Muse, BSN, RN

## 2024-01-28 ENCOUNTER — Other Ambulatory Visit: Payer: Self-pay

## 2024-01-28 DIAGNOSIS — B20 Human immunodeficiency virus [HIV] disease: Secondary | ICD-10-CM

## 2024-01-28 MED ORDER — BIKTARVY 50-200-25 MG PO TABS: 1.0000 | ORAL_TABLET | Freq: Every day | ORAL | 3 refills | Status: AC

## 2024-01-28 NOTE — Progress Notes (Deleted)
 Subjective:  Chief complaint: follow-up for HIV disease on medications   Patient ID: Jeremiah [name], male    DOB: [birth year], 53 y.o.   MRN: [id number]  HPI  Past Medical History:  Diagnosis Date   Anal condyloma    Anxiety    Carpal tunnel syndrome    Right   COPD exacerbation (HCC) 12/27/2023   COPD with emphysema (HCC)    last exacerbation 10-16-2020 documented in epic   Depression    Dyslipidemia    Hemorrhoids    History of anal intraepithelial neoplasia III 10/2015   History of chlamydia    hx multiple rescurrent chlamydia,  last episode 05-20-2021 with positive RPR   History of pneumothorax 06/29/2018   post procedure , VATS w/ wedge resection RUL mass 06-23-2018   HIV (human immunodeficiency virus infection) (HCC)    followed by dr jinny. elaine (ID)   Internal hemorrhoids    Latent syphilis    LGSIL Pap smear of anus 01/04/2024   Phimosis    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HCC) 01/06/2018   06/23/2018-surgical pathology report- lung wedge biopsy necrotizing granulomatous inflammation associated fibrosis, no evidence of malignancy   Rectal cancer (HCC) 01/2017   followed by GI-- dr s. stacia;; (previous oncologist in [place]-- dr lanny);;   treated in [facility],  dx 10/ 2018 and completed chemoradiation 01/ 2019   Screening for lung cancer 03/11/2023   Tick bite 08/08/2020   Tooth missing    per pt had two lower teeth and one molar extracted due to being broken 07-16-2021, pt stated there was no infection   Vaccine counseling 02/24/2023    Past Surgical History:  Procedure Laterality Date   CIRCUMCISION N/A 09/11/2021   Procedure: CIRCUMCISION ADULT;  Surgeon: Devere Lonni Righter, MD;  Location: [facility];  Service: Urology;  Laterality: N/A;   COLONOSCOPY WITH PROPOFOL   06/28/2021   by dr s. stacia   IR RADIOLOGIST EVAL & MGMT  05/17/2019   IR RADIOLOGY PERIPHERAL GUIDED IV START  05/20/2019   IR TRANSCATH RETRIEVAL FB INCL GUIDANCE (MS)  05/20/2019    IR US  GUIDE VASC ACCESS RIGHT  05/20/2019   LAPAROSCOPIC APPENDECTOMY N/A 01/11/2020   Procedure: APPENDECTOMY LAPAROSCOPIC;  Surgeon: Vernetta Berg, MD;  Location: [facility];  Service: General;  Laterality: N/A;   LAPAROSCOPIC INGUINAL HERNIA REPAIR  1991   ORCHIECTOMY  2016   unilateral   PORT-A-CATH REMOVAL N/A 05/11/2019   Procedure: PARTIAL PORT REMOVAL;  Surgeon: Debby Hila, MD;  Location: [facility];  Service: General;  Laterality: N/A;   VIDEO ASSISTED THORACOSCOPY (VATS)/WEDGE RESECTION Right 06/23/2018   Procedure: VIDEO ASSISTED THORACOSCOPY (VATS)/LUNG RESECTION, stapling and dissection of apical bleb, wedge resection of right upper lobe mass, lymph node dissection, intercostal nerve block;  Surgeon: Army Dallas NOVAK, MD;  Location: [facility];  Service: Thoracic;  Laterality: Right;   VIDEO BRONCHOSCOPY N/A 06/23/2018   Procedure: VIDEO BRONCHOSCOPY;  Surgeon: Army Dallas NOVAK, MD;  Location: [facility] OR;  Service: Thoracic;  Laterality: N/A;    Family History  Problem Relation Age of Onset   Diabetes Mother    Hypertension Mother    Cancer Mother        breast   Diabetes Father    Hypertension Father    Colon cancer Neg Hx    Colon polyps Neg Hx    Esophageal cancer Neg Hx    Stomach cancer Neg Hx    Rectal cancer Neg Hx  Social History   Socioeconomic History   Marital status: Single    Spouse name: Not on file   Number of children: Not on file   Years of education: Not on file   Highest education level: Not on file  Occupational History   Not on file  Tobacco Use   Smoking status: Every Day    Types: Cigars   Smokeless tobacco: Never   Tobacco comments:    6 cigars a day-per pt 07-17-2021  Vaping Use   Vaping status: Never Used  Substance and Sexual Activity   Alcohol use: Not Currently    Comment: 1.5 pt liquior day  , none since 09/ 2022   Drug use: Not Currently    Comment: Past history of crack cocaine use ,  last time 09/ 2022    Sexual activity: Not Currently    Partners: Male    Birth control/protection: Condom    Comment: declined condoms  Other Topics Concern   Not on file  Social History Narrative   Not on file   Social Drivers of Health   Financial Resource Strain: Low Risk  (11/21/2022)   Received from [facility]   Overall Financial Resource Strain (CARDIA)    Difficulty of Paying Living Expenses: Not hard at all  Food Insecurity: No Food Insecurity (11/21/2022)   Received from [facility]   Hunger Vital Sign    Within the past 12 months, you worried that your food would run out before you got the money to buy more.: Never true    Within the past 12 months, the food you bought just didn't last and you didn't have money to get more.: Never true  Transportation Needs: No Transportation Needs (11/21/2022)   Received from [facility]    Lack of Transportation (Medical): No    Lack of Transportation (Non-Medical): No  Physical Activity: Not on file  Stress: Not on file  Social Connections: Unknown (06/27/2022)   Received from [facility]   Social Network    Social Network: Not on file    Allergies  Allergen Reactions   Nsaids Other (See Comments)    DRUG INTERACTION WITH HIV MEDS   Sulfa  Antibiotics Itching     Current Outpatient Medications:    albuterol  (VENTOLIN  HFA) 108 (90 Base) MCG/ACT inhaler, Inhale 1-2 puffs into the lungs every 6 (six) hours as needed for wheezing or shortness of breath., Disp: 18 g, Rfl: 2   amLODipine  (NORVASC ) 5 MG tablet, Take 1 tablet (5 mg total) by mouth daily., Disp: 30 tablet, Rfl: 11   amoxicillin -clavulanate (AUGMENTIN ) 875-125 MG tablet, Take 1 tablet by mouth every 12 (twelve) hours., Disp: 14 tablet, Rfl: 0   atorvastatin  (LIPITOR) 20 MG tablet, Take 1 tablet (20 mg total) by mouth daily., Disp: 30 tablet, Rfl: 11   benzonatate  (TESSALON ) 200 MG capsule, Take 1 capsule (200 mg total) by mouth 3 (three) times daily as  needed., Disp: 20 capsule, Rfl: 0   bictegravir-emtricitabine -tenofovir  AF (BIKTARVY ) 50-200-25 MG TABS tablet, Take 1 tablet by mouth daily., Disp: 30 tablet, Rfl: 3   ciclopirox  (PENLAC ) 8 % solution, Apply topically at bedtime. Apply over nail and surrounding skin. Apply daily over previous coat. After seven (7) days, may remove with alcohol and continue cycle., Disp: 6.6 mL, Rfl: 0   dapsone  100 MG tablet, Take 1 tablet (100 mg total) by mouth daily., Disp: 30 tablet, Rfl: 2   escitalopram  (LEXAPRO ) 5 MG tablet,  Take 1 tablet (5 mg total) by mouth daily., Disp: 30 tablet, Rfl: 11   gabapentin  (NEURONTIN ) 800 MG tablet, Take 1 tablet (800 mg total) by mouth 2 (two) times daily., Disp: 60 tablet, Rfl: 1   gabapentin  (NEURONTIN ) 800 MG tablet, Take 1 tablet (800 mg total) by mouth 3 (three) times daily., Disp: 90 tablet, Rfl: 11   mometasone -formoterol  (DULERA) 200-5 MCG/ACT AERO, Inhale 2 puffs into the lungs in the morning and at bedtime., Disp: 13 g, Rfl: 5   mometasone -formoterol  (DULERA) 200-5 MCG/ACT AERO, Inhale 2 puffs into the lungs in the morning and at bedtime., Disp: 1 each, Rfl: 11   Varenicline  Tartrate, Starter, (CHANTIX  STARTING MONTH PAK) 0.5 MG X 11 & 1 MG X 42 TBPK, Per Starter pack instructions, Disp: 1 each, Rfl: 0   Vitamin D , Ergocalciferol , (DRISDOL ) 1.25 MG (50000 UNIT) CAPS capsule, Take 1 capsule (50,000 Units total) by mouth every 7 (seven) days., Disp: 5 capsule, Rfl: 11   Review of Systems     Objective:   Physical Exam        Assessment & Plan:

## 2024-01-29 ENCOUNTER — Telehealth: Payer: Self-pay

## 2024-01-29 ENCOUNTER — Encounter: Admitting: Primary Care

## 2024-01-29 DIAGNOSIS — J439 Emphysema, unspecified: Secondary | ICD-10-CM

## 2024-01-29 DIAGNOSIS — J441 Chronic obstructive pulmonary disease with (acute) exacerbation: Secondary | ICD-10-CM

## 2024-01-29 NOTE — Telephone Encounter (Signed)
 ATC pt X1. Lmtcb. Pt missed his 1 pm virtual appt with Landry Ferrari, NP and needs to be rescheduled.

## 2024-01-29 NOTE — Progress Notes (Signed)
 This encounter was created in error - please disregard.

## 2024-02-01 ENCOUNTER — Ambulatory Visit: Admitting: Infectious Disease

## 2024-02-01 DIAGNOSIS — Z7185 Encounter for immunization safety counseling: Secondary | ICD-10-CM

## 2024-02-01 DIAGNOSIS — E785 Hyperlipidemia, unspecified: Secondary | ICD-10-CM

## 2024-02-01 DIAGNOSIS — C210 Malignant neoplasm of anus, unspecified: Secondary | ICD-10-CM

## 2024-02-01 DIAGNOSIS — R85612 Low grade squamous intraepithelial lesion on cytologic smear of anus (LGSIL): Secondary | ICD-10-CM

## 2024-02-01 DIAGNOSIS — B20 Human immunodeficiency virus [HIV] disease: Secondary | ICD-10-CM

## 2024-02-09 ENCOUNTER — Ambulatory Visit: Admitting: Primary Care

## 2024-02-11 ENCOUNTER — Ambulatory Visit: Admitting: Infectious Diseases

## 2024-02-12 ENCOUNTER — Telehealth: Payer: Self-pay | Admitting: Primary Care

## 2024-02-12 ENCOUNTER — Encounter: Admitting: Primary Care

## 2024-02-12 DIAGNOSIS — R911 Solitary pulmonary nodule: Secondary | ICD-10-CM

## 2024-02-12 DIAGNOSIS — J439 Emphysema, unspecified: Secondary | ICD-10-CM

## 2024-02-12 DIAGNOSIS — J441 Chronic obstructive pulmonary disease with (acute) exacerbation: Secondary | ICD-10-CM

## 2024-02-12 NOTE — Telephone Encounter (Signed)
 Patient forgot about televisit and is also not able to do visit since [place], he will need to schedule OV in person. Please cancel todays visit and call to reschedule visit with me in the next month or so

## 2024-02-12 NOTE — Progress Notes (Signed)
 This encounter was created in error - please disregard.

## 2024-02-24 ENCOUNTER — Telehealth: Payer: Self-pay

## 2024-02-24 NOTE — Telephone Encounter (Signed)
 Jeremiah [name] called wanting to know if our office tested his magnesium . Notified him that it looks as if his PCP, Jeremiah [name], was checking his vitamin and electrolyte levels. He is also asking about refills of amlodipine . Discussed that Dr. Fleeta [name] was filling this, but that his PCP should now be managing this Rx. He took his last pill today. He is agreeable to call his PCP to discuss magnesium  and request amlodipine  refills.   Jeremiah [name], BSN, RN

## 2024-03-04 ENCOUNTER — Ambulatory Visit: Admitting: Infectious Diseases

## 2024-03-14 ENCOUNTER — Ambulatory Visit: Payer: MEDICAID | Admitting: Infectious Disease

## 2024-03-30 NOTE — Progress Notes (Deleted)
 Subjective:   Chief complaint: follow-up for HIV disease on medications   Patient ID: Jeremiah [name], male    DOB: 12/04/1970, 53 y.o.   MRN: [id number]  HPI  Past Medical History:  Diagnosis Date   Anal condyloma    Anxiety    Carpal tunnel syndrome    Right   COPD exacerbation (HCC) 12/27/2023   COPD with emphysema (HCC)    last exacerbation 10-16-2020 documented in epic   Depression    Dyslipidemia    Hemorrhoids    History of anal intraepithelial neoplasia III 10/2015   History of chlamydia    hx multiple rescurrent chlamydia,  last episode 05-20-2021 with positive RPR   History of pneumothorax 06/29/2018   post procedure , VATS w/ wedge resection RUL mass 06-23-2018   HIV (human immunodeficiency virus infection) (HCC)    followed by dr jinny. elaine (ID)   Internal hemorrhoids    Latent syphilis    LGSIL Pap smear of anus 01/04/2024   Phimosis    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HCC) 01/06/2018   06/23/2018-surgical pathology report- lung wedge biopsy necrotizing granulomatous inflammation associated fibrosis, no evidence of malignancy   Rectal cancer (HCC) 01/2017   followed by GI-- dr s. stacia;; (previous oncologist in [place]-- dr lanny);;   treated in [facility],  dx 10/ 2018 and completed chemoradiation 01/ 2019   Screening for lung cancer 03/11/2023   Tick bite 08/08/2020   Tooth missing    per pt had two lower teeth and one molar extracted due to being broken 07-16-2021, pt stated there was no infection   Vaccine counseling 02/24/2023    Past Surgical History:  Procedure Laterality Date   CIRCUMCISION N/A 09/11/2021   Procedure: CIRCUMCISION ADULT;  Surgeon: Devere Lonni Righter, MD;  Location: [facility];  Service: Urology;  Laterality: N/A;   COLONOSCOPY WITH PROPOFOL   06/28/2021   by dr s. stacia   IR RADIOLOGIST EVAL & MGMT  05/17/2019   IR RADIOLOGY PERIPHERAL GUIDED IV START  05/20/2019   IR TRANSCATH RETRIEVAL FB INCL GUIDANCE (MS)  05/20/2019    IR US  GUIDE VASC ACCESS RIGHT  05/20/2019   LAPAROSCOPIC APPENDECTOMY N/A 01/11/2020   Procedure: APPENDECTOMY LAPAROSCOPIC;  Surgeon: Vernetta Berg, MD;  Location: [facility];  Service: General;  Laterality: N/A;   LAPAROSCOPIC INGUINAL HERNIA REPAIR  1991   ORCHIECTOMY  2016   unilateral   PORT-A-CATH REMOVAL N/A 05/11/2019   Procedure: PARTIAL PORT REMOVAL;  Surgeon: Debby Hila, MD;  Location: [facility];  Service: General;  Laterality: N/A;   VIDEO ASSISTED THORACOSCOPY (VATS)/WEDGE RESECTION Right 06/23/2018   Procedure: VIDEO ASSISTED THORACOSCOPY (VATS)/LUNG RESECTION, stapling and dissection of apical bleb, wedge resection of right upper lobe mass, lymph node dissection, intercostal nerve block;  Surgeon: Army Dallas NOVAK, MD;  Location: [facility];  Service: Thoracic;  Laterality: Right;   VIDEO BRONCHOSCOPY N/A 06/23/2018   Procedure: VIDEO BRONCHOSCOPY;  Surgeon: Army Dallas NOVAK, MD;  Location: [facility] OR;  Service: Thoracic;  Laterality: N/A;    Family History  Problem Relation Age of Onset   Diabetes Mother    Hypertension Mother    Cancer Mother        breast   Diabetes Father    Hypertension Father    Colon cancer Neg Hx    Colon polyps Neg Hx    Esophageal cancer Neg Hx    Stomach cancer Neg Hx    Rectal cancer Neg Hx  Social History   Socioeconomic History   Marital status: Single    Spouse name: Not on file   Number of children: Not on file   Years of education: Not on file   Highest education level: Not on file  Occupational History   Not on file  Tobacco Use   Smoking status: Every Day    Types: Cigars   Smokeless tobacco: Never   Tobacco comments:    6 cigars a day-per pt 07-17-2021  Vaping Use   Vaping status: Never Used  Substance and Sexual Activity   Alcohol use: Not Currently    Comment: 1.5 pt liquior day  , none since 09/ 2022   Drug use: Not Currently    Comment: Past history of crack cocaine use ,  last time 09/ 2022    Sexual activity: Not Currently    Partners: Male    Birth control/protection: Condom    Comment: declined condoms  Other Topics Concern   Not on file  Social History Narrative   Not on file   Social Drivers of Health   Financial Resource Strain: Low Risk (11/21/2022)   Received from [facility]   Overall Financial Resource Strain (CARDIA)    Difficulty of Paying Living Expenses: Not hard at all  Food Insecurity: No Food Insecurity (11/21/2022)   [facility]   Hunger Vital Sign    Within the past 12 months, you worried that your food would run out before you got the money to buy more.: Never true    Within the past 12 months, the food you bought just didn't last and you didn't have money to get more.: Never true  Transportation Needs: No Transportation Needs (11/21/2022)   Received from [facility] - Transportation    Lack of Transportation (Medical): No    Lack of Transportation (Non-Medical): No  Physical Activity: Not on file  Stress: Not on file  Social Connections: Not on file    Allergies  Allergen Reactions   Nsaids Other (See Comments)    DRUG INTERACTION WITH HIV MEDS   Sulfa  Antibiotics Itching     Current Outpatient Medications:    albuterol  (VENTOLIN  HFA) 108 (90 Base) MCG/ACT inhaler, Inhale 1-2 puffs into the lungs every 6 (six) hours as needed for wheezing or shortness of breath., Disp: 18 g, Rfl: 2   amLODipine  (NORVASC ) 5 MG tablet, Take 1 tablet (5 mg total) by mouth daily., Disp: 30 tablet, Rfl: 11   amoxicillin -clavulanate (AUGMENTIN ) 875-125 MG tablet, Take 1 tablet by mouth every 12 (twelve) hours., Disp: 14 tablet, Rfl: 0   atorvastatin  (LIPITOR) 20 MG tablet, Take 1 tablet (20 mg total) by mouth daily., Disp: 30 tablet, Rfl: 11   benzonatate  (TESSALON ) 200 MG capsule, Take 1 capsule (200 mg total) by mouth 3 (three) times daily as needed., Disp: 20 capsule, Rfl: 0   bictegravir-emtricitabine -tenofovir  AF (BIKTARVY )  50-200-25 MG TABS tablet, Take 1 tablet by mouth daily., Disp: 30 tablet, Rfl: 3   ciclopirox  (PENLAC ) 8 % solution, Apply topically at bedtime. Apply over nail and surrounding skin. Apply daily over previous coat. After seven (7) days, may remove with alcohol and continue cycle., Disp: 6.6 mL, Rfl: 0   dapsone  100 MG tablet, Take 1 tablet (100 mg total) by mouth daily., Disp: 30 tablet, Rfl: 2   escitalopram  (LEXAPRO ) 5 MG tablet, Take 1 tablet (5 mg total) by mouth daily., Disp: 30 tablet, Rfl: 11   gabapentin  (NEURONTIN )  800 MG tablet, Take 1 tablet (800 mg total) by mouth 2 (two) times daily., Disp: 60 tablet, Rfl: 1   gabapentin  (NEURONTIN ) 800 MG tablet, Take 1 tablet (800 mg total) by mouth 3 (three) times daily., Disp: 90 tablet, Rfl: 11   mometasone -formoterol  (DULERA) 200-5 MCG/ACT AERO, Inhale 2 puffs into the lungs in the morning and at bedtime., Disp: 13 g, Rfl: 5   mometasone -formoterol  (DULERA) 200-5 MCG/ACT AERO, Inhale 2 puffs into the lungs in the morning and at bedtime., Disp: 1 each, Rfl: 11   Varenicline  Tartrate, Starter, (CHANTIX  STARTING MONTH PAK) 0.5 MG X 11 & 1 MG X 42 TBPK, Per Starter pack instructions, Disp: 1 each, Rfl: 0   Vitamin D , Ergocalciferol , (DRISDOL ) 1.25 MG (50000 UNIT) CAPS capsule, Take 1 capsule (50,000 Units total) by mouth every 7 (seven) days., Disp: 5 capsule, Rfl: 11    Review of Systems     Objective:   Physical Exam        Assessment & Plan:

## 2024-03-31 ENCOUNTER — Ambulatory Visit: Payer: MEDICAID | Admitting: Infectious Disease
